# Patient Record
Sex: Male | Born: 1935 | Race: White | Hispanic: No | Marital: Married | Smoking: Former smoker
Health system: Southern US, Community
[De-identification: ages and names within clinical notes are randomized; demographics above are authoritative.]

## PROBLEM LIST (undated history)

## (undated) DIAGNOSIS — E785 Hyperlipidemia, unspecified: Secondary | ICD-10-CM

## (undated) DIAGNOSIS — R011 Cardiac murmur, unspecified: Secondary | ICD-10-CM

## (undated) DIAGNOSIS — I712 Thoracic aortic aneurysm, without rupture, unspecified: Secondary | ICD-10-CM

## (undated) DIAGNOSIS — I2510 Atherosclerotic heart disease of native coronary artery without angina pectoris: Secondary | ICD-10-CM

## (undated) DIAGNOSIS — Z87442 Personal history of urinary calculi: Secondary | ICD-10-CM

## (undated) DIAGNOSIS — D229 Melanocytic nevi, unspecified: Secondary | ICD-10-CM

## (undated) DIAGNOSIS — M1990 Unspecified osteoarthritis, unspecified site: Secondary | ICD-10-CM

## (undated) DIAGNOSIS — I447 Left bundle-branch block, unspecified: Secondary | ICD-10-CM

## (undated) DIAGNOSIS — E7889 Other lipoprotein metabolism disorders: Secondary | ICD-10-CM

## (undated) DIAGNOSIS — I700 Atherosclerosis of aorta: Secondary | ICD-10-CM

## (undated) DIAGNOSIS — I359 Nonrheumatic aortic valve disorder, unspecified: Secondary | ICD-10-CM

## (undated) DIAGNOSIS — I440 Atrioventricular block, first degree: Secondary | ICD-10-CM

## (undated) DIAGNOSIS — I10 Essential (primary) hypertension: Secondary | ICD-10-CM

## (undated) DIAGNOSIS — C189 Malignant neoplasm of colon, unspecified: Secondary | ICD-10-CM

## (undated) DIAGNOSIS — C4492 Squamous cell carcinoma of skin, unspecified: Secondary | ICD-10-CM

## (undated) DIAGNOSIS — I77810 Thoracic aortic ectasia: Secondary | ICD-10-CM

## (undated) HISTORY — DX: Atrioventricular block, first degree: I44.0

## (undated) HISTORY — PX: LUMBAR LAMINECTOMY: SHX95

## (undated) HISTORY — PX: TONSILLECTOMY: SUR1361

## (undated) HISTORY — DX: Malignant neoplasm of colon, unspecified: C18.9

## (undated) HISTORY — DX: Atherosclerotic heart disease of native coronary artery without angina pectoris: I25.10

## (undated) HISTORY — DX: Atherosclerosis of aorta: I70.0

## (undated) HISTORY — PX: JOINT REPLACEMENT: SHX530

## (undated) HISTORY — DX: Essential (primary) hypertension: I10

## (undated) HISTORY — DX: Thoracic aortic ectasia: I77.810

## (undated) HISTORY — PX: COLONOSCOPY W/ POLYPECTOMY: SHX1380

## (undated) HISTORY — PX: COLON SURGERY: SHX602

## (undated) HISTORY — DX: Nonrheumatic aortic valve disorder, unspecified: I35.9

## (undated) HISTORY — DX: Thoracic aortic aneurysm, without rupture, unspecified: I71.20

## (undated) HISTORY — DX: Left bundle-branch block, unspecified: I44.7

## (undated) HISTORY — PX: SPINE SURGERY: SHX786

## (undated) HISTORY — DX: Other lipoprotein metabolism disorders: E78.89

## (undated) HISTORY — DX: Thoracic aortic aneurysm, without rupture: I71.2

## (undated) HISTORY — DX: Hyperlipidemia, unspecified: E78.5

## (undated) HISTORY — DX: Squamous cell carcinoma of skin, unspecified: C44.92

## (undated) HISTORY — PX: CARDIAC VALVE REPLACEMENT: SHX585

## (undated) HISTORY — PX: BACK SURGERY: SHX140

---
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
## 1995-06-20 HISTORY — PX: PARTIAL COLECTOMY: SHX5273

## 1996-06-19 HISTORY — PX: TYMPANOPLASTY: SHX33

## 2005-08-03 ENCOUNTER — Encounter: Admission: RE | Admit: 2005-08-03 | Discharge: 2005-08-03 | Payer: Self-pay | Admitting: Family Medicine

## 2005-10-11 ENCOUNTER — Encounter: Admission: RE | Admit: 2005-10-11 | Discharge: 2005-10-11 | Payer: Self-pay | Admitting: Family Medicine

## 2006-06-22 ENCOUNTER — Ambulatory Visit: Payer: Self-pay | Admitting: Internal Medicine

## 2006-06-22 LAB — CONVERTED CEMR LAB
ALT: 24 units/L (ref 0–40)
AST: 23 units/L (ref 0–37)
Albumin: 3.6 g/dL (ref 3.5–5.2)
Alkaline Phosphatase: 62 units/L (ref 39–117)
BUN: 13 mg/dL (ref 6–23)
Basophils Absolute: 0 10*3/uL (ref 0.0–0.1)
Basophils Relative: 0.9 % (ref 0.0–1.0)
CO2: 29 meq/L (ref 19–32)
Calcium: 9 mg/dL (ref 8.4–10.5)
Chloride: 108 meq/L (ref 96–112)
Chol/HDL Ratio, serum: 4
Cholesterol: 228 mg/dL (ref 0–200)
Creatinine, Ser: 0.9 mg/dL (ref 0.4–1.5)
Eosinophil percent: 3.4 % (ref 0.0–5.0)
GFR calc non Af Amer: 88 mL/min
Glomerular Filtration Rate, Af Am: 107 mL/min/{1.73_m2}
Glucose, Bld: 94 mg/dL (ref 70–99)
HCT: 40.9 % (ref 39.0–52.0)
HDL: 57.2 mg/dL (ref >39.0)
Hemoglobin: 13.5 g/dL (ref 13.0–17.0)
LDL DIRECT: 135.2 mg/dL
Lymphocytes Relative: 25.3 % (ref 12.0–46.0)
MCHC: 33.1 g/dL (ref 30.0–36.0)
MCV: 96.3 fL (ref 78.0–100.0)
Monocytes Absolute: 0.7 10*3/uL (ref 0.2–0.7)
Monocytes Relative: 12.4 % — ABNORMAL HIGH (ref 3.0–11.0)
Neutro Abs: 3.2 10*3/uL (ref 1.4–7.7)
Neutrophils Relative %: 58 % (ref 43.0–77.0)
PSA: 0.8 ng/mL (ref 0.10–4.00)
Platelets: 293 10*3/uL (ref 150–400)
Potassium: 4.2 meq/L (ref 3.5–5.1)
RBC: 4.24 M/uL (ref 4.22–5.81)
RDW: 15.1 % — ABNORMAL HIGH (ref 11.5–14.6)
Sodium: 141 meq/L (ref 135–145)
TSH: 1 microintl units/mL (ref 0.35–5.50)
Total Bilirubin: 0.9 mg/dL (ref 0.3–1.2)
Total Protein: 6.8 g/dL (ref 6.0–8.3)
Triglyceride fasting, serum: 87 mg/dL (ref 0–149)
VLDL: 17 mg/dL (ref 0–40)
WBC: 5.5 10*3/uL (ref 4.5–10.5)

## 2006-07-13 ENCOUNTER — Ambulatory Visit: Payer: Self-pay | Admitting: Internal Medicine

## 2006-11-01 ENCOUNTER — Ambulatory Visit: Payer: Self-pay | Admitting: Internal Medicine

## 2006-11-13 ENCOUNTER — Ambulatory Visit: Payer: Self-pay | Admitting: Internal Medicine

## 2006-11-15 ENCOUNTER — Encounter: Admission: RE | Admit: 2006-11-15 | Discharge: 2006-11-15 | Payer: Self-pay | Admitting: Internal Medicine

## 2007-01-31 DIAGNOSIS — I350 Nonrheumatic aortic (valve) stenosis: Secondary | ICD-10-CM | POA: Insufficient documentation

## 2007-01-31 DIAGNOSIS — E7800 Pure hypercholesterolemia, unspecified: Secondary | ICD-10-CM | POA: Insufficient documentation

## 2007-01-31 DIAGNOSIS — I10 Essential (primary) hypertension: Secondary | ICD-10-CM | POA: Insufficient documentation

## 2007-01-31 DIAGNOSIS — Z85038 Personal history of other malignant neoplasm of large intestine: Secondary | ICD-10-CM | POA: Insufficient documentation

## 2007-01-31 DIAGNOSIS — M109 Gout, unspecified: Secondary | ICD-10-CM | POA: Insufficient documentation

## 2007-06-20 HISTORY — PX: FACET JOINT INJECTION: SHX5016

## 2007-06-21 ENCOUNTER — Telehealth: Payer: Self-pay | Admitting: Internal Medicine

## 2007-06-24 ENCOUNTER — Encounter: Payer: Self-pay | Admitting: Internal Medicine

## 2007-12-27 ENCOUNTER — Ambulatory Visit: Payer: Self-pay | Admitting: Internal Medicine

## 2007-12-27 LAB — CONVERTED CEMR LAB
ALT: 25 units/L (ref 0–53)
AST: 22 units/L (ref 0–37)
Albumin: 3.6 g/dL (ref 3.5–5.2)
Alkaline Phosphatase: 63 units/L (ref 39–117)
BUN: 15 mg/dL (ref 6–23)
Basophils Absolute: 0 10*3/uL (ref 0.0–0.1)
Basophils Relative: 0.5 % (ref 0.0–1.0)
Bilirubin, Direct: 0.1 mg/dL (ref 0.0–0.3)
CO2: 28 meq/L (ref 19–32)
Calcium: 9.2 mg/dL (ref 8.4–10.5)
Chloride: 106 meq/L (ref 96–112)
Cholesterol: 216 mg/dL (ref 0–200)
Creatinine, Ser: 0.8 mg/dL (ref 0.4–1.5)
Direct LDL: 117.7 mg/dL
Eosinophils Absolute: 0.2 10*3/uL (ref 0.0–0.7)
Eosinophils Relative: 3.1 % (ref 0.0–5.0)
GFR calc Af Amer: 122 mL/min
GFR calc non Af Amer: 101 mL/min
Glucose, Bld: 95 mg/dL (ref 70–99)
HCT: 39.4 % (ref 39.0–52.0)
HDL: 55.4 mg/dL (ref >39.0)
Hemoglobin: 13.5 g/dL (ref 13.0–17.0)
Lymphocytes Relative: 20.5 % (ref 12.0–46.0)
MCHC: 34.2 g/dL (ref 30.0–36.0)
MCV: 97.4 fL (ref 78.0–100.0)
Monocytes Absolute: 0.7 10*3/uL (ref 0.1–1.0)
Monocytes Relative: 10.6 % (ref 3.0–12.0)
Neutro Abs: 4.1 10*3/uL (ref 1.4–7.7)
Neutrophils Relative %: 65.3 % (ref 43.0–77.0)
PSA: 1.19 ng/mL (ref 0.10–4.00)
Platelets: 311 10*3/uL (ref 150–400)
Potassium: 4.5 meq/L (ref 3.5–5.1)
RBC: 4.05 M/uL — ABNORMAL LOW (ref 4.22–5.81)
RDW: 14.2 % (ref 11.5–14.6)
Sodium: 141 meq/L (ref 135–145)
TSH: 1.07 microintl units/mL (ref 0.35–5.50)
Total Bilirubin: 0.9 mg/dL (ref 0.3–1.2)
Total CHOL/HDL Ratio: 3.9
Total Protein: 7.1 g/dL (ref 6.0–8.3)
Triglycerides: 134 mg/dL (ref 0–149)
VLDL: 27 mg/dL (ref 0–40)
WBC: 6.3 10*3/uL (ref 4.5–10.5)

## 2008-01-03 ENCOUNTER — Ambulatory Visit: Payer: Self-pay | Admitting: Cardiology

## 2008-01-07 DIAGNOSIS — Z87442 Personal history of urinary calculi: Secondary | ICD-10-CM | POA: Insufficient documentation

## 2008-01-09 ENCOUNTER — Encounter: Payer: Self-pay | Admitting: Internal Medicine

## 2008-01-14 ENCOUNTER — Ambulatory Visit: Payer: Self-pay | Admitting: Internal Medicine

## 2008-01-20 ENCOUNTER — Encounter: Payer: Self-pay | Admitting: Internal Medicine

## 2008-06-19 HISTORY — PX: LITHOTRIPSY: SUR834

## 2008-07-02 ENCOUNTER — Ambulatory Visit: Payer: Self-pay | Admitting: Internal Medicine

## 2008-07-03 ENCOUNTER — Encounter: Payer: Self-pay | Admitting: Internal Medicine

## 2008-07-03 LAB — CONVERTED CEMR LAB: Crystals, Fluid: NONE SEEN

## 2008-07-08 ENCOUNTER — Telehealth (INDEPENDENT_AMBULATORY_CARE_PROVIDER_SITE_OTHER): Payer: Self-pay | Admitting: *Deleted

## 2008-08-07 ENCOUNTER — Encounter: Payer: Self-pay | Admitting: Internal Medicine

## 2008-08-20 ENCOUNTER — Telehealth: Payer: Self-pay | Admitting: Internal Medicine

## 2008-08-31 ENCOUNTER — Telehealth: Payer: Self-pay | Admitting: Internal Medicine

## 2008-12-18 ENCOUNTER — Encounter: Payer: Self-pay | Admitting: Internal Medicine

## 2009-01-29 ENCOUNTER — Encounter: Payer: Self-pay | Admitting: Internal Medicine

## 2009-05-24 ENCOUNTER — Encounter (INDEPENDENT_AMBULATORY_CARE_PROVIDER_SITE_OTHER): Payer: Self-pay | Admitting: *Deleted

## 2009-05-27 ENCOUNTER — Ambulatory Visit (HOSPITAL_COMMUNITY): Admission: RE | Admit: 2009-05-27 | Discharge: 2009-05-27 | Payer: Self-pay | Admitting: Urology

## 2009-06-03 ENCOUNTER — Encounter: Payer: Self-pay | Admitting: Internal Medicine

## 2009-08-13 ENCOUNTER — Telehealth: Payer: Self-pay | Admitting: Internal Medicine

## 2009-10-04 ENCOUNTER — Ambulatory Visit: Payer: Self-pay | Admitting: Internal Medicine

## 2009-10-13 LAB — CONVERTED CEMR LAB
ALT: 20 units/L (ref 0–53)
AST: 22 units/L (ref 0–37)
Albumin: 3.7 g/dL (ref 3.5–5.2)
Alkaline Phosphatase: 63 units/L (ref 39–117)
BUN: 13 mg/dL (ref 6–23)
Basophils Absolute: 0 10*3/uL (ref 0.0–0.1)
Basophils Relative: 0.9 % (ref 0.0–3.0)
Bilirubin, Direct: 0 mg/dL (ref 0.0–0.3)
CO2: 29 meq/L (ref 19–32)
Calcium: 9.2 mg/dL (ref 8.4–10.5)
Chloride: 108 meq/L (ref 96–112)
Cholesterol: 217 mg/dL — ABNORMAL HIGH (ref 0–200)
Creatinine, Ser: 0.8 mg/dL (ref 0.4–1.5)
Direct LDL: 131.2 mg/dL
Eosinophils Absolute: 0.1 10*3/uL (ref 0.0–0.7)
Eosinophils Relative: 3 % (ref 0.0–5.0)
GFR calc non Af Amer: 100.24 mL/min (ref >60)
Glucose, Bld: 107 mg/dL — ABNORMAL HIGH (ref 70–99)
HCT: 35.6 % — ABNORMAL LOW (ref 39.0–52.0)
HDL: 65.7 mg/dL (ref >39.00)
Hemoglobin: 12.1 g/dL — ABNORMAL LOW (ref 13.0–17.0)
Lymphocytes Relative: 27 % (ref 12.0–46.0)
Lymphs Abs: 1.3 10*3/uL (ref 0.7–4.0)
MCHC: 34 g/dL (ref 30.0–36.0)
MCV: 94 fL (ref 78.0–100.0)
Monocytes Absolute: 0.5 10*3/uL (ref 0.1–1.0)
Monocytes Relative: 11.1 % (ref 3.0–12.0)
Neutro Abs: 2.7 10*3/uL (ref 1.4–7.7)
Neutrophils Relative %: 58 % (ref 43.0–77.0)
PSA: 1.36 ng/mL (ref 0.10–4.00)
Platelets: 287 10*3/uL (ref 150.0–400.0)
Potassium: 4.9 meq/L (ref 3.5–5.1)
RBC: 3.78 M/uL — ABNORMAL LOW (ref 4.22–5.81)
RDW: 16.1 % — ABNORMAL HIGH (ref 11.5–14.6)
Sodium: 143 meq/L (ref 135–145)
TSH: 0.69 microintl units/mL (ref 0.35–5.50)
Total Bilirubin: 0.5 mg/dL (ref 0.3–1.2)
Total CHOL/HDL Ratio: 3
Total Protein: 7.1 g/dL (ref 6.0–8.3)
Triglycerides: 89 mg/dL (ref 0.0–149.0)
VLDL: 17.8 mg/dL (ref 0.0–40.0)
WBC: 4.7 10*3/uL (ref 4.5–10.5)

## 2009-10-14 ENCOUNTER — Ambulatory Visit: Payer: Self-pay | Admitting: Internal Medicine

## 2009-10-18 LAB — CONVERTED CEMR LAB
Basophils Absolute: 0 10*3/uL (ref 0.0–0.1)
Basophils Relative: 0.9 % (ref 0.0–3.0)
Eosinophils Absolute: 0.2 10*3/uL (ref 0.0–0.7)
Eosinophils Relative: 4.6 % (ref 0.0–5.0)
Ferritin: 11.1 ng/mL — ABNORMAL LOW (ref 22.0–322.0)
HCT: 36.7 % — ABNORMAL LOW (ref 39.0–52.0)
Hemoglobin: 12.4 g/dL — ABNORMAL LOW (ref 13.0–17.0)
Iron: 21 ug/dL — ABNORMAL LOW (ref 42–165)
Lymphocytes Relative: 34.4 % (ref 12.0–46.0)
Lymphs Abs: 1.7 10*3/uL (ref 0.7–4.0)
MCHC: 33.7 g/dL (ref 30.0–36.0)
MCV: 94.3 fL (ref 78.0–100.0)
Monocytes Absolute: 0.6 10*3/uL (ref 0.1–1.0)
Monocytes Relative: 11.6 % (ref 3.0–12.0)
Neutro Abs: 2.4 10*3/uL (ref 1.4–7.7)
Neutrophils Relative %: 48.5 % (ref 43.0–77.0)
Platelets: 316 10*3/uL (ref 150.0–400.0)
RBC: 3.89 M/uL — ABNORMAL LOW (ref 4.22–5.81)
RDW: 16.1 % — ABNORMAL HIGH (ref 11.5–14.6)
Saturation Ratios: 5 % — ABNORMAL LOW (ref 20.0–50.0)
Transferrin: 298.6 mg/dL (ref 212.0–360.0)
Vitamin B-12: 389 pg/mL (ref 211–911)
WBC: 4.9 10*3/uL (ref 4.5–10.5)

## 2009-10-19 ENCOUNTER — Encounter (INDEPENDENT_AMBULATORY_CARE_PROVIDER_SITE_OTHER): Payer: Self-pay | Admitting: *Deleted

## 2009-10-19 ENCOUNTER — Ambulatory Visit: Payer: Self-pay | Admitting: Internal Medicine

## 2009-10-22 ENCOUNTER — Ambulatory Visit: Payer: Self-pay | Admitting: Internal Medicine

## 2009-10-22 LAB — HM COLONOSCOPY

## 2009-10-25 ENCOUNTER — Encounter: Payer: Self-pay | Admitting: Internal Medicine

## 2009-12-10 ENCOUNTER — Encounter: Payer: Self-pay | Admitting: Internal Medicine

## 2010-07-19 NOTE — Procedures (Signed)
Summary: Colonoscopy  Patient: Barry Decker Note: All result statuses are Final unless otherwise noted.  Tests: (1) Colonoscopy (COL)   COL Colonoscopy           DONE     [facility]     [address]     [place], [place]  [address]           COLONOSCOPY PROCEDURE REPORT           PATIENT:  [name], [name]  MR#:  [id number]     BIRTHDATE:  08/31/1932, 76 yrs. old  GENDER:  male     ENDOSCOPIST:  Hedwig Morton. Juanda Chance, MD     REF. BY:  Birdie Sons, M.D.     PROCEDURE DATE:  10/22/2009     PROCEDURE:  Colonoscopy 62703     ASA CLASS:  Class II     INDICATIONS:  Routine Risk Screening prior right hemicolectomy for     a benign polyp     MEDICATIONS:   Versed 5 mg, Fentanyl 62.5 mcg           DESCRIPTION OF PROCEDURE:   After the risks benefits and     alternatives of the procedure were thoroughly explained, informed     consent was obtained.  Digital rectal exam was performed and     revealed no rectal masses.   The LB CF-[id number] endoscope     was introduced through the anus and advanced to the cecum, which     was identified by both the appendix and ileocecal valve, without     limitations.  The quality of the prep was good, using MiraLax.     The instrument was then slowly withdrawn as the colon was fully     examined.     <<PROCEDUREIMAGES>>           FINDINGS:  A diminutive polyp was found in the rectum. The polyp     was removed using cold biopsy forceps (see image4).  The right     colon was surgically resected and an ileo-colonic anastamosis was     seen (see image2 and image3).  Internal hemorrhoids were found     (see image5).   Retroflexed views in the rectum revealed no     abnormalities.    The scope was then withdrawn from the patient     and the procedure completed.           COMPLICATIONS:  None     ENDOSCOPIC IMPRESSION:     1) Diminutive polyp in the rectum     2) Prior right hemi-colectomy     3) Internal hemorrhoids     RECOMMENDATIONS:     1)  Await pathology results     2) high fiber diet     REPEAT EXAM:  In 5 - 7 year(s) for.           ______________________________     Hedwig Morton. Juanda Chance, MD           CC:           n.     eSIGNED:   Hedwig Morton. Winry Egnew at 10/22/2009 08:28 AM           Judithann Graves, [id number]  Note: An exclamation mark (!) indicates a result that was not dispersed into the flowsheet. Document Creation Date: 10/22/2009 8:29 AM _______________________________________________________________________  (1) Order result status: Final Collection or observation date-time: 10/22/2009 08:21 Requested date-time:  Receipt  date-time:  Reported date-time:  Referring Physician:   Ordering Physician: Lina Sar [id number]) Specimen Source:  Source: Launa Grill Order Number: [id number] Lab site:   Appended Document: Colonoscopy     Procedures Next Due Date:    Colonoscopy: 10/2016

## 2010-07-19 NOTE — Letter (Signed)
Summary: Patient Notice- Polyp Results  [facility]  [address] [place], [place] [address]   Phone: [phone]  Fax: [phone]        Oct 25, 2009 MRN: [id number]    [facility] [address] [place], [place]  [address]    Dear Barry Decker,  I am pleased to inform you that the colon polyp(s) removed during your recent colonoscopy was (were) found to be benign (no cancer detected) upon pathologic examination.The polyp was hyperplastic ( not precancerous)  I recommend you have a repeat colonoscopy examination in 7_ years to look for recurrent polyps, as having colon polyps increases your risk for having recurrent polyps or even colon cancer in the future.  Should you develop new or worsening symptoms of abdominal pain, bowel habit changes or bleeding from the rectum or bowels, please schedule an evaluation with either your primary care physician or with me.  Additional information/recommendations:  _x_ No further action with gastroenterology is needed at this time. Please      follow-up with your primary care physician for your other healthcare      needs.  __ Please call [phone] to schedule a return visit to review your      situation.  __ Please keep your follow-up visit as already scheduled.  __ Continue treatment plan as outlined the day of your exam.  Please call us if you are having persistent problems or have questions about your condition that have not been fully answered at this time.  Sincerely,  Hart Carwin MD  This letter has been electronically signed by your physician.  Appended Document: Patient Notice- Polyp Results letter mailed 5.11.11

## 2010-07-19 NOTE — Progress Notes (Signed)
Summary: refill  Phone Note Refill Request Message from:  Fax from Pharmacy on August 13, 2009 2:25 PM  Refills Requested: Medication #1:  NIFEDICAL XL 60 MG  TB24 Take 1 tablet by mouth once a day  Method Requested: Electronic Initial call taken by: Kern Reap CMA Duncan Dull),  August 13, 2009 2:25 PM    Prescriptions: NIFEDICAL XL 60 MG  TB24 (NIFEDIPINE) Take 1 tablet by mouth once a day  #90 x 1   Entered by:   Kern Reap CMA (AAMA)   Authorized by:   Birdie Sons MD   Signed by:   Kern Reap CMA (AAMA) on 08/13/2009   Method used:   Electronically to        [facility]* (mail-order)             ,          Ph: [phone]       Fax: [phone]   RxID:   [id number]

## 2010-07-19 NOTE — Assessment & Plan Note (Signed)
Summary: emp---will fast//ccm   Vital Signs:  Patient profile:   77 year old male Height:      72.5 inches Weight:      235 pounds BMI:     31.55 Pulse rate:   68 / minute Pulse rhythm:   regular Resp:     12 per minute BP sitting:   154 / 68  (left arm) Cuff size:   regular  Vitals Entered By: Gladis Riffle, RN (October 04, 2009 8:51 AM)  Nutrition Counseling: Patient's BMI is greater than 25 and therefore counseled on weight management options. CC: cpx, fasting Is Patient Diabetic? No   CC:  cpx and fasting.  History of Present Illness: CPX  Preventive Screening-Counseling & Management  Alcohol-Tobacco     Alcohol drinks/day: <1     Smoking Status: quit     Year Quit: 1985  Current Problems (verified): 1)  Nephrolithiasis, Hx of  (ICD-V13.01) 2)  Preventive Health Care  (ICD-V70.0) 3)  Aortic Regurgitation  (ICD-424.1) 4)  Hypercholesterolemia With High Hdl  (ICD-272.0) 5)  Hypertension  (ICD-401.9) 6)  Hyperlipidemia  (ICD-272.4) 7)  Gout  (ICD-274.9) 8)  Colon Cancer, Hx of  (ICD-V10.05)  Current Medications (verified): 1)  Allopurinol 300 Mg  Tabs (Allopurinol) .... Take 1 Tablet By Mouth Once A Day 2)  Nifedical Xl 60 Mg  Tb24 (Nifedipine) .... Take 1 Tablet By Mouth Once A Day 3)  Quinapril Hcl 20 Mg  Tabs (Quinapril Hcl) .... Take 1 Tablet By Mouth Once A Day 4)  Aspirin 81 Mg  Tbec (Aspirin) .... Once Daily  Allergies (verified): No Known Drug Allergies  Past History:  Past Medical History: Last updated: 2008/01/06 Colon cancer, hx of (precancer) Gout elevated HDL Hypertension aortic regurgitation squamous cell skin cancer  Family History: Last updated: 01/06/2008 father deceased 40yo mother deceased MI age 78 yo  Social History: Last updated: 01-06-2008 Retired Former Smoker Alcohol use-yes Regular exercise-no  Risk Factors: Alcohol Use: <1 (10/04/2009) Exercise: no (January 06, 2008)  Risk Factors: Smoking Status: quit  (10/04/2009)  Past Surgical History: lumbar laminectomy   ~1990 facet joint injection 2009 partial colectomy --precancerours 2005  Review of Systems       All other systems reviewed and were negative   Physical Exam  General:  alert and well-developed.   Head:  normocephalic and atraumatic.   Eyes:  pupils equal and pupils round.   Ears:  R ear normal and L ear normal.   Nose:  no external deformity and no external erythema.   Neck:  No deformities, masses, or tenderness noted. Chest Wall:  No deformities, masses, tenderness or gynecomastia noted. Lungs:  normal respiratory effort and no intercostal retractions.   Heart:  normal rate and regular rhythm.   Abdomen:  soft and non-tender.   Rectal:  no external abnormalities and normal sphincter tone.   Prostate:  no nodules, no asymmetry, and 2+ enlarged.   Msk:  normal ROM and no joint tenderness.   Pulses:  R radial normal and L radial normal.   Extremities:  No clubbing, cyanosis, edema, or deformity noted  Neurologic:  cranial nerves II-XII intact and gait normal.     Impression & Recommendations:  Problem # 1:  PREVENTIVE HEALTH CARE (ICD-V70.0)  health maint UTD  Orders: EKG w/ Interpretation (93000) UA Dipstick w/o Micro (automated)  (81003) Venipuncture (02725) TLB-Lipid Panel (80061-LIPID) TLB-BMP (Basic Metabolic Panel-BMET) (80048-METABOL) TLB-CBC Platelet - w/Differential (85025-CBCD) TLB-Hepatic/Liver Function Pnl (80076-HEPATIC) TLB-TSH (Thyroid Stimulating Hormone) (84443-TSH) TLB-PSA (  Prostate Specific Antigen) (84153-PSA)  Problem # 2:  HYPERCHOLESTEROLEMIA WITH HIGH HDL (ICD-272.0) controlled check labs today Labs Reviewed: SGOT: 22 (12/27/2007)   SGPT: 25 (12/27/2007)   HDL:55.4 (12/27/2007), 57.2 (06/22/2006)  LDL:DEL (12/27/2007), DEL (06/22/2006)  Chol:216 (12/27/2007), 228 (06/22/2006)  Trig:134 (12/27/2007), 87 (06/22/2006)  Problem # 3:  HYPERTENSION (ICD-401.9) advised to monitor at  home goal bp <135/85 His updated medication list for this problem includes:    Nifedical Xl 60 Mg Tb24 (Nifedipine) .Marland Kitchen... Take 1 tablet by mouth once a day    Quinapril Hcl 20 Mg Tabs (Quinapril hcl) .Marland Kitchen... Take 1 tablet by mouth once a day  BP today: 154/68---recheck 144/70 Prior BP: 130/66 (07/02/2008)  Labs Reviewed: K+: 4.5 (12/27/2007) Creat: : 0.8 (12/27/2007)   Chol: 216 (12/27/2007)   HDL: 55.4 (12/27/2007)   LDL: DEL (12/27/2007)   TG: 134 (12/27/2007)  Complete Medication List: 1)  Allopurinol 300 Mg Tabs (Allopurinol) .... Take 1 tablet by mouth once a day 2)  Nifedical Xl 60 Mg Tb24 (Nifedipine) .... Take 1 tablet by mouth once a day 3)  Quinapril Hcl 20 Mg Tabs (Quinapril hcl) .... Take 1 tablet by mouth once a day 4)  Aspirin 81 Mg Tbec (Aspirin) .... Once daily Prescriptions: ALLOPURINOL 300 MG  TABS (ALLOPURINOL) Take 1 tablet by mouth once a day  #30 x 0   Entered and Authorized by:   Birdie Sons MD   Signed by:   Birdie Sons MD on 10/04/2009   Method used:   Electronically to        [facility] # [phone]* (retail)       [address]       [place], [place]  [address]       Ph: [phone]       Fax: [phone]   RxID:   [id number] ALLOPURINOL 300 MG  TABS (ALLOPURINOL) Take 1 tablet by mouth once a day  #90 x 3   Entered and Authorized by:   Birdie Sons MD   Signed by:   Birdie Sons MD on 10/04/2009   Method used:   Electronically to        [facility]* (mail-order)             ,          Ph: [phone]       Fax: [phone]   RxID:   [id number]   Prevention & Chronic Care Immunizations   Influenza vaccine: Not documented   Influenza vaccine due: 02/17/2010    Tetanus booster: 12/27/2007: Td   Tetanus booster due: 12/26/2017    Pneumococcal vaccine: historical  (06/19/2004)    H. zoster vaccine: Not documented  Colorectal Screening   Hemoccult: Not documented   Hemoccult action/deferral: Not indicated  (10/04/2009)     Colonoscopy: historical  (06/20/2003)   Colonoscopy due: 06/19/2013  Other Screening   PSA: 1.19  (12/27/2007)   PSA ordered.   Smoking status: quit  (10/04/2009)  Lipids   Total Cholesterol: 216  (12/27/2007)   LDL: DEL  (12/27/2007)   LDL Direct: 117.7  (12/27/2007)   HDL: 55.4  (12/27/2007)   Triglycerides: 134  (12/27/2007)    SGOT (AST): 22  (12/27/2007)   SGPT (ALT): 25  (12/27/2007)   Alkaline phosphatase: 63  (12/27/2007)   Total bilirubin: 0.9  (12/27/2007)  Hypertension   Last Blood Pressure: 154 / 68  (10/04/2009)   Serum creatinine: 0.8  (12/27/2007)   Serum potassium 4.5  (12/27/2007)  Hypertension flowsheet reviewed?: Yes   Progress toward BP goal: Deteriorated  Self-Management Support :    Hypertension self-management support: Not documented    Lipid self-management support: Not documented     Appended Document: emp---will fast//ccm  Laboratory Results   Urine Tests    Routine Urinalysis   Color: yellow Appearance: Clear Glucose: negative   (Normal Range: Negative) Bilirubin: negative   (Normal Range: Negative) Ketone: negative   (Normal Range: Negative) Spec. Gravity: 1.025   (Normal Range: 1.003-1.035) Blood: trace-lysed   (Normal Range: Negative) pH: 5.5   (Normal Range: 5.0-8.0) Protein: trace   (Normal Range: Negative) Urobilinogen: 0.2   (Normal Range: 0-1) Nitrite: negative   (Normal Range: Negative) Leukocyte Esterace: negative   (Normal Range: Negative)    Comments: Rita Ohara  October 04, 2009 10:38 AM      Appended Document: emp---will fast//ccm call patient. unable to locate colonoscopy---refer for screening exam  Appended Document: emp---will fast//ccm Patient wife notified. Will await when and where of appt.  order in process.

## 2010-07-19 NOTE — Letter (Signed)
Summary: Patient Notice- Polyp Results  [facility]  [address] [place], [place] [address]   Phone: [phone]  Fax: [phone]        Oct 25, 2009 MRN: [id number]    [facility] [address] [place], [place]  [address]    Dear Barry Decker,  I am pleased to inform you that the colon polyp(s) removed during your recent colonoscopy was (were) found to be benign (no cancer detected) upon pathologic examination.The polyp was hyperplastic ( not precancerous)  I recommend you have a repeat colonoscopy examination in _7 years to look for recurrent polyps, as having colon polyps increases your risk for having recurrent polyps or even colon cancer in the future.  Should you develop new or worsening symptoms of abdominal pain, bowel habit changes or bleeding from the rectum or bowels, please schedule an evaluation with either your primary care physician or with me.  Additional information/recommendations:  _x_ No further action with gastroenterology is needed at this time. Please      follow-up with your primary care physician for your other healthcare      needs.  __ Please call [phone] to schedule a return visit to review your      situation.  __ Please keep your follow-up visit as already scheduled.  __ Continue treatment plan as outlined the day of your exam.  Please call us if you are having persistent problems or have questions about your condition that have not been fully answered at this time.  Sincerely,  Hart Carwin MD  This letter has been electronically signed by your physician.

## 2010-07-19 NOTE — Letter (Signed)
Summary: [facility]  [facility]   Imported By: Maryln Gottron 12/23/2009 12:20:50  _____________________________________________________________________  External Attachment:    Type:   Image     Comment:   External Document

## 2010-07-19 NOTE — Letter (Signed)
Summary: [facility] Instructions  [facility]  [address] [place], [place] [address]   Phone: [phone]  Fax: [phone]       Barry Decker    Feb 08, 1933    MRN: [id number]       Procedure Day Dorna Bloom:  Farrell Ours  10/22/09     Arrival Time: 7:30AM     Procedure Time:  8:00AM     Location of Procedure:                    Juliann Pares _  [facility] (4th Floor)    PREPARATION FOR COLONOSCOPY WITH MIRALAX  Starting 5 days prior to your procedure 10/17/09 do not eat nuts, seeds, popcorn, corn, beans, peas,  salads, or any raw vegetables.  Do not take any fiber supplements (e.g. Metamucil, Citrucel, and Benefiber). ____________________________________________________________________________________________________   THE DAY BEFORE YOUR PROCEDURE         DATE: 10/21/09  DAY: THURSDAY  1   Drink clear liquids the entire day-NO SOLID FOOD  2   Do not drink anything colored red or purple.  Avoid juices with pulp.  No orange juice.  3   Drink at least 64 oz. (8 glasses) of fluid/clear liquids during the day to prevent dehydration and help the prep work efficiently.  CLEAR LIQUIDS INCLUDE: Water Jello Ice Popsicles Tea (sugar ok, no milk/cream) Powdered fruit flavored drinks Coffee (sugar ok, no milk/cream) Gatorade Juice: apple, white grape, white cranberry  Lemonade Clear bullion, consomm, broth Carbonated beverages (any kind) Strained chicken noodle soup Hard Candy  4   Mix the entire bottle of Miralax with 64 oz. of Gatorade/Powerade in the morning and put in the refrigerator to chill.  5   At 3:00 pm take 2 Dulcolax/Bisacodyl tablets.  6   At 4:30 pm take one Reglan/Metoclopramide tablet.  7  Starting at 5:00 pm drink one 8 oz glass of the Miralax mixture every 15-20 minutes until you have finished drinking the entire 64 oz.  You should finish drinking prep around 7:30 or 8:00 pm.  8   If you are nauseated, you may take the 2nd Reglan/Metoclopramide tablet  at 6:30 pm.        9    At 8:00 pm take 2 more DULCOLAX/Bisacodyl tablets.     THE DAY OF YOUR PROCEDURE      DATE:  10/22/09  DAY: Farrell Ours  You may drink clear liquids until 6:00AM  (2 HOURS BEFORE PROCEDURE).   MEDICATION INSTRUCTIONS  Unless otherwise instructed, you should take regular prescription medications with a small sip of water as early as possible the morning of your procedure.          OTHER INSTRUCTIONS  You will need a responsible adult at least 75 years of age to accompany you and drive you home.   This person must remain in the waiting room during your procedure.  Wear loose fitting clothing that is easily removed.  Leave jewelry and other valuables at home.  However, you may wish to bring a book to read or an iPod/MP3 player to listen to music as you wait for your procedure to start.  Remove all body piercing jewelry and leave at home.  Total time from sign-in until discharge is approximately 2-3 hours.  You should go home directly after your procedure and rest.  You can resume normal activities the day after your procedure.  The day of your procedure you should not:   Drive  Make legal decisions   Operate machinery   Drink alcohol   Return to work  You will receive specific instructions about eating, activities and medications before you leave.   The above instructions have been reviewed and explained to me by   Wyona Almas RN  Oct 19, 2009 2:55 PM     I fully understand and can verbalize these instructions _____________________________ Date _______

## 2010-07-19 NOTE — Miscellaneous (Signed)
Summary: [facility] Previsit/prep  Clinical Lists Changes  Medications: Added new medication of DULCOLAX 5 MG  TBEC (BISACODYL) Day before procedure take 2 at 3pm and 2 at 8pm. - Signed Added new medication of METOCLOPRAMIDE HCL 10 MG  TABS (METOCLOPRAMIDE HCL) As per prep instructions. - Signed Added new medication of MIRALAX   POWD (POLYETHYLENE GLYCOL 3350) As per prep  instructions. - Signed Rx of DULCOLAX 5 MG  TBEC (BISACODYL) Day before procedure take 2 at 3pm and 2 at 8pm.;  #4 x 0;  Signed;  Entered by: Wyona Almas RN;  Authorized by: Hart Carwin MD;  Method used: Electronically to [facility] # [phone]*, [address], [place], [place]  [address], Ph: [phone], Fax: [phone] Rx of METOCLOPRAMIDE HCL 10 MG  TABS (METOCLOPRAMIDE HCL) As per prep instructions.;  #2 x 0;  Signed;  Entered by: Wyona Almas RN;  Authorized by: Hart Carwin MD;  Method used: Electronically to [facility] [address] # [phone]*, [address] [place], [place]  [address], Ph: [phone], Fax: [phone] Rx of MIRALAX   POWD (POLYETHYLENE GLYCOL 3350) As per prep  instructions.;  #255gm x 0;  Signed;  Entered by: Wyona Almas RN;  Authorized by: Hart Carwin MD;  Method used: Electronically to [facility] Dr. # [id number]*, [address] [place], [place]  [address], Ph: [phone], Fax: [phone] Observations: Added new observation of NKA: T (10/19/2009 14:24)    Prescriptions: MIRALAX   POWD (POLYETHYLENE GLYCOL 3350) As per prep  instructions.  #255gm x 0   Entered by:   Wyona Almas RN   Authorized by:   Hart Carwin MD   Signed by:   Wyona Almas RN on 10/19/2009   Method used:   Electronically to        [facility] # [phone]* (retail)       [address]       [place], [place]  [address]       Ph: [phone]       Fax: [phone]   RxID:   [id number] METOCLOPRAMIDE HCL 10 MG  TABS (METOCLOPRAMIDE HCL) As per prep instructions.  #2 x 0   Entered by:   Wyona Almas RN  Authorized by:   Hart Carwin MD   Signed by:   Wyona Almas RN on 10/19/2009   Method used:   Electronically to        [facility] # [phone]* (retail)       [address]       [place], [place]  [address]       Ph: [phone]       Fax: [phone]   RxID:   [id number] DULCOLAX 5 MG  TBEC (BISACODYL) Day before procedure take 2 at 3pm and 2 at 8pm.  #4 x 0   Entered by:   Wyona Almas RN   Authorized by:   Hart Carwin MD   Signed by:   Wyona Almas RN on 10/19/2009   Method used:   Electronically to        [facility] # [phone]* (retail)       [address]       [place], [place]  [address]       Ph: [phone]       Fax: [phone]   RxID:   [id number]

## 2010-09-16 ENCOUNTER — Ambulatory Visit (INDEPENDENT_AMBULATORY_CARE_PROVIDER_SITE_OTHER): Payer: Medicare PPO | Admitting: Internal Medicine

## 2010-09-16 ENCOUNTER — Encounter: Payer: Self-pay | Admitting: Internal Medicine

## 2010-09-16 VITALS — BP 144/70 | HR 72 | Temp 97.8°F | Ht 74.0 in | Wt 234.0 lb

## 2010-09-16 DIAGNOSIS — M130 Polyarthritis, unspecified: Secondary | ICD-10-CM | POA: Insufficient documentation

## 2010-09-16 LAB — CBC
HCT: 38.6 % — ABNORMAL LOW (ref 39.0–52.0)
Hemoglobin: 12.8 g/dL — ABNORMAL LOW (ref 13.0–17.0)
MCH: 32.6 pg (ref 26.0–34.0)
MCHC: 33.2 g/dL (ref 30.0–36.0)
MCV: 98.2 fL (ref 78.0–100.0)
Platelets: 303 10*3/uL (ref 150–400)
RBC: 3.93 MIL/uL — ABNORMAL LOW (ref 4.22–5.81)
RDW: 13.4 % (ref 11.5–15.5)
WBC: 5 10*3/uL (ref 4.0–10.5)

## 2010-09-16 LAB — SEDIMENTATION RATE: Sed Rate: 19 mm/hr (ref 0–22)

## 2010-09-16 NOTE — Assessment & Plan Note (Signed)
sxs ongoing for several months Suspect a local MSK problem Will check labs and bilateral knee xrays We will call him with results

## 2010-09-16 NOTE — Progress Notes (Signed)
  Subjective:    Patient ID: Barry Decker, male    DOB: 14-Jun-1933, 77 y.o.   MRN: [id number]  HPI  3 month hx of bilateral knee>>shoulder pain. Knees are most noticeable when he first gets up. Not much pain with continued walking.  No swelling, no erythema. Has tried aleve---no results. Shoulders are only bothersome when he uses his arms vigorously.   Past Medical History  Diagnosis Date  . Colon cancer   . Gout   . Hypertension   . Elevated HDL   . Aortic regurgitation   . Squamous cell skin cancer    Past Surgical History  Procedure Date  . Lumbar laminectomy   . Facial joint injection 2009  . Partial colectomy 2005    precancerours    reports that he quit smoking about 30 years ago. He does not have any smokeless tobacco history on file. He reports that he drinks alcohol. His drug history not on file. family history includes Heart attack in his brother, father, and mother and Hypertension in his sister. No Known Allergies   Review of Systems  patient denies chest pain, shortness of breath, orthopnea. Denies lower extremity edema, abdominal pain, change in appetite, change in bowel movements. Patient denies rashes, musculoskeletal complaints. No other specific complaints in a complete review of systems.      Objective:   Physical Exam Well-developed male in no acute distress. HEENT exam atraumatic, normocephalic, neck supple. Chest clear to auscultation cardiac exam S1-S2 are regular. Extremities no edema. This has full range of motion of both shoulders. No erythema or effusions. Patient has full range of motion of both knees without erythema or effusion. Knees are stable bilaterally. Gait is normal.       Assessment & Plan:

## 2010-09-17 LAB — RHEUMATOID FACTOR: Rhuematoid fact SerPl-aCnc: <10 IU/mL (ref <=14)

## 2010-09-19 LAB — ANA: Anti Nuclear Antibody(ANA): NEGATIVE

## 2010-09-20 ENCOUNTER — Other Ambulatory Visit: Payer: Self-pay | Admitting: Internal Medicine

## 2010-09-20 ENCOUNTER — Ambulatory Visit (INDEPENDENT_AMBULATORY_CARE_PROVIDER_SITE_OTHER)
Admission: RE | Admit: 2010-09-20 | Discharge: 2010-09-20 | Disposition: A | Discharge status: Home / Self Care | Payer: Medicare PPO | Source: Ambulatory Visit | Attending: Internal Medicine | Admitting: Internal Medicine

## 2010-09-20 DIAGNOSIS — M130 Polyarthritis, unspecified: Secondary | ICD-10-CM

## 2010-09-29 NOTE — Progress Notes (Signed)
Pt aware, will call back if he wants PT

## 2010-11-03 ENCOUNTER — Telehealth: Payer: Self-pay | Admitting: Internal Medicine

## 2010-11-03 NOTE — Telephone Encounter (Signed)
Pt came by office and said that he was with [facility] last year and has now changed to [facility]. Pt is on Nifedical XL 60 mg, but this med is too expensive. [employer] told pt that Amlodipine Besylate, is an alternative med to Nifedical and is much cheaper. Pt req script for Amlodipine Besylate 90 day supply to [facility] fax# [phone].

## 2010-11-07 NOTE — Telephone Encounter (Signed)
Ok to change

## 2010-11-07 NOTE — Telephone Encounter (Signed)
Ok to change to amlodipine 5 mg po qd

## 2010-11-09 MED ORDER — AMLODIPINE BESYLATE 5 MG PO TABS: 5.0000 mg | ORAL_TABLET | Freq: Every day | ORAL | Status: DC

## 2010-11-09 NOTE — Telephone Encounter (Signed)
rx faxed

## 2011-02-13 ENCOUNTER — Other Ambulatory Visit: Payer: Self-pay | Admitting: Internal Medicine

## 2011-08-04 ENCOUNTER — Other Ambulatory Visit: Payer: Self-pay | Admitting: Dermatology

## 2011-08-04 DIAGNOSIS — C4492 Squamous cell carcinoma of skin, unspecified: Secondary | ICD-10-CM

## 2011-08-04 HISTORY — DX: Squamous cell carcinoma of skin, unspecified: C44.92

## 2011-08-18 ENCOUNTER — Telehealth: Payer: Self-pay | Admitting: Internal Medicine

## 2011-08-18 ENCOUNTER — Other Ambulatory Visit: Payer: Self-pay | Admitting: *Deleted

## 2011-08-18 MED ORDER — QUINAPRIL HCL 20 MG PO TABS: 20.0000 mg | ORAL_TABLET | Freq: Every day | ORAL | Status: DC

## 2011-08-18 MED ORDER — ALLOPURINOL 300 MG PO TABS: 300.0000 mg | ORAL_TABLET | Freq: Every day | ORAL | Status: DC

## 2011-08-18 NOTE — Telephone Encounter (Signed)
rx sent in electronically 

## 2011-08-18 NOTE — Telephone Encounter (Signed)
quinapril (ACCUPRIL) 20 MG tablet - pt was suppose to have a refill sent to [facility] for this med. As well

## 2011-08-22 ENCOUNTER — Telehealth: Payer: Self-pay | Admitting: Internal Medicine

## 2011-08-22 ENCOUNTER — Other Ambulatory Visit: Payer: Self-pay | Admitting: *Deleted

## 2011-08-22 MED ORDER — QUINAPRIL HCL 20 MG PO TABS: 20.0000 mg | ORAL_TABLET | Freq: Every day | ORAL | Status: DC

## 2011-08-22 NOTE — Telephone Encounter (Signed)
rx resent to [facility]

## 2011-08-22 NOTE — Telephone Encounter (Signed)
Patient called stating that [facility] is still saying they are not receiving the refill requests for accupril. Please assist

## 2011-09-19 ENCOUNTER — Ambulatory Visit (INDEPENDENT_AMBULATORY_CARE_PROVIDER_SITE_OTHER): Payer: Medicare PPO | Admitting: Internal Medicine

## 2011-09-19 ENCOUNTER — Encounter: Payer: Self-pay | Admitting: Internal Medicine

## 2011-09-19 VITALS — BP 162/70 | HR 72 | Temp 98.5°F | Ht 72.0 in | Wt 218.0 lb

## 2011-09-19 DIAGNOSIS — Z85038 Personal history of other malignant neoplasm of large intestine: Secondary | ICD-10-CM

## 2011-09-19 DIAGNOSIS — I10 Essential (primary) hypertension: Secondary | ICD-10-CM

## 2011-09-19 DIAGNOSIS — M109 Gout, unspecified: Secondary | ICD-10-CM

## 2011-09-19 DIAGNOSIS — I359 Nonrheumatic aortic valve disorder, unspecified: Secondary | ICD-10-CM

## 2011-09-19 DIAGNOSIS — Z0000 Encounter for general adult medical examination without abnormal findings: Secondary | ICD-10-CM

## 2011-09-19 DIAGNOSIS — Z87442 Personal history of urinary calculi: Secondary | ICD-10-CM

## 2011-09-19 LAB — CBC WITH DIFFERENTIAL/PLATELET
Basophils Absolute: 0 10*3/uL (ref 0.0–0.1)
Basophils Relative: 0.8 % (ref 0.0–3.0)
Eosinophils Absolute: 0.2 10*3/uL (ref 0.0–0.7)
Eosinophils Relative: 4 % (ref 0.0–5.0)
HCT: 36.8 % — ABNORMAL LOW (ref 39.0–52.0)
Hemoglobin: 12.2 g/dL — ABNORMAL LOW (ref 13.0–17.0)
Lymphocytes Relative: 27.4 % (ref 12.0–46.0)
Lymphs Abs: 1.2 10*3/uL (ref 0.7–4.0)
MCHC: 33.1 g/dL (ref 30.0–36.0)
MCV: 100.4 fl — ABNORMAL HIGH (ref 78.0–100.0)
Monocytes Absolute: 0.4 10*3/uL (ref 0.1–1.0)
Monocytes Relative: 9.8 % (ref 3.0–12.0)
Neutro Abs: 2.5 10*3/uL (ref 1.4–7.7)
Neutrophils Relative %: 58 % (ref 43.0–77.0)
Platelets: 258 10*3/uL (ref 150.0–400.0)
RBC: 3.66 Mil/uL — ABNORMAL LOW (ref 4.22–5.81)
RDW: 14.4 % (ref 11.5–14.6)
WBC: 4.3 10*3/uL — ABNORMAL LOW (ref 4.5–10.5)

## 2011-09-19 LAB — HEPATIC FUNCTION PANEL
ALT: 21 U/L (ref 0–53)
AST: 22 U/L (ref 0–37)
Albumin: 3.9 g/dL (ref 3.5–5.2)
Alkaline Phosphatase: 49 U/L (ref 39–117)
Bilirubin, Direct: 0 mg/dL (ref 0.0–0.3)
Total Bilirubin: 0.3 mg/dL (ref 0.3–1.2)
Total Protein: 6.7 g/dL (ref 6.0–8.3)

## 2011-09-19 LAB — BASIC METABOLIC PANEL
BUN: 21 mg/dL (ref 6–23)
CO2: 27 mEq/L (ref 19–32)
Calcium: 9 mg/dL (ref 8.4–10.5)
Chloride: 103 mEq/L (ref 96–112)
Creatinine, Ser: 0.8 mg/dL (ref 0.4–1.5)
GFR: 96.92 mL/min (ref >60.00)
Glucose, Bld: 87 mg/dL (ref 70–99)
Potassium: 4.3 mEq/L (ref 3.5–5.1)
Sodium: 138 mEq/L (ref 135–145)

## 2011-09-19 LAB — TSH: TSH: 0.69 u[IU]/mL (ref 0.35–5.50)

## 2011-09-19 MED ORDER — POTASSIUM CITRATE ER 10 MEQ (1080 MG) PO TBCR: 10.0000 meq | EXTENDED_RELEASE_TABLET | Freq: Three times a day (TID) | ORAL | Status: DC

## 2011-09-19 NOTE — Progress Notes (Signed)
Patient ID: Barry Decker, male   DOB: March 31, 1933, 78 y.o.   MRN: [id number] cpx  Past Medical History  Diagnosis Date  . Colon cancer   . Gout   . Hypertension   . Elevated HDL   . Aortic regurgitation   . Squamous cell skin cancer     History   Social History  . Marital Status: Married    Spouse Name: N/A    Number of Children: N/A  . Years of Education: N/A   Occupational History  . Not on file.   Social History Main Topics  . Smoking status: Former Smoker    Quit date: 09/15/1980  . Smokeless tobacco: Not on file  . Alcohol Use: Yes  . Drug Use: Not on file  . Sexually Active: Not on file   Other Topics Concern  . Not on file   Social History Narrative  . No narrative on file    Past Surgical History  Procedure Date  . Lumbar laminectomy   . Facial joint injection 2009  . Partial colectomy 2005    precancerours    Family History  Problem Relation Age of Onset  . Heart attack Mother   . Heart attack Father   . Hypertension Sister   . Heart attack Brother     No Known Allergies  Current Outpatient Prescriptions on File Prior to Visit  Medication Sig Dispense Refill  . allopurinol (ZYLOPRIM) 300 MG tablet Take 1 tablet (300 mg total) by mouth daily.  90 tablet  1  . amLODipine (NORVASC) 5 MG tablet TAKE 1 TABLET DAILY  90 tablet  1  . aspirin 81 MG tablet Take 81 mg by mouth daily.        . ferrous sulfate 325 (65 FE) MG tablet Take 325 mg by mouth 2 (two) times daily.        . potassium citrate (UROCIT-K) 10 MEQ (1080 MG) SR tablet Take 10 mEq by mouth 3 (three) times daily with meals.        . quinapril (ACCUPRIL) 20 MG tablet Take 1 tablet (20 mg total) by mouth at bedtime.  90 tablet  1     patient denies chest pain, shortness of breath, orthopnea. Denies lower extremity edema, abdominal pain, change in appetite, change in bowel movements. Patient denies rashes, musculoskeletal complaints. No other specific complaints in a complete review of  systems.   BP 162/70  Pulse 72  Temp(Src) 98.5 F (36.9 C) (Oral)  Ht 6' (1.829 m)  Wt 218 lb (98.884 kg)  BMI 29.57 kg/m2 Well-developed male in no acute distress. HEENT exam atraumatic, normocephalic, extraocular muscles are intact. Conjunctivae are pink without exudate. Neck is supple without lymphadenopathy, thyromegaly, jugular venous distention. Chest is clear to auscultation without increased work of breathing. Cardiac exam S1-S2 are regular. The PMI is normal. 3/6 SEM and 1/6 diastolic decrescendo murmur. Abdominal exam active bowel sounds, soft, nontender. No abdominal bruits. Extremities no clubbing cyanosis or edema. Peripheral pulses are normal without bruits. Neurologic exam alert and oriented without any motor or sensory deficits.   A/P Well visit: health maint UTD

## 2011-09-19 NOTE — Assessment & Plan Note (Signed)
He has a systolic and diastolic aortic murmur. I'd like to get echo

## 2011-09-26 ENCOUNTER — Other Ambulatory Visit: Payer: Self-pay

## 2011-09-26 ENCOUNTER — Ambulatory Visit (HOSPITAL_COMMUNITY): Payer: Medicare PPO | Attending: Internal Medicine

## 2011-09-26 DIAGNOSIS — Z87891 Personal history of nicotine dependence: Secondary | ICD-10-CM | POA: Insufficient documentation

## 2011-09-26 DIAGNOSIS — R002 Palpitations: Secondary | ICD-10-CM | POA: Insufficient documentation

## 2011-09-26 DIAGNOSIS — I359 Nonrheumatic aortic valve disorder, unspecified: Secondary | ICD-10-CM

## 2011-12-15 ENCOUNTER — Other Ambulatory Visit: Payer: Self-pay | Admitting: Internal Medicine

## 2011-12-19 ENCOUNTER — Telehealth: Payer: Self-pay | Admitting: Internal Medicine

## 2011-12-19 ENCOUNTER — Other Ambulatory Visit (INDEPENDENT_AMBULATORY_CARE_PROVIDER_SITE_OTHER): Payer: Medicare PPO

## 2011-12-19 DIAGNOSIS — D649 Anemia, unspecified: Secondary | ICD-10-CM

## 2011-12-19 LAB — CBC WITH DIFFERENTIAL/PLATELET
Basophils Absolute: 0 10*3/uL (ref 0.0–0.1)
Basophils Relative: 0.7 % (ref 0.0–3.0)
Eosinophils Absolute: 0.3 10*3/uL (ref 0.0–0.7)
Eosinophils Relative: 5.2 % — ABNORMAL HIGH (ref 0.0–5.0)
HCT: 38 % — ABNORMAL LOW (ref 39.0–52.0)
Hemoglobin: 12.5 g/dL — ABNORMAL LOW (ref 13.0–17.0)
Lymphocytes Relative: 29.9 % (ref 12.0–46.0)
Lymphs Abs: 1.5 10*3/uL (ref 0.7–4.0)
MCHC: 32.9 g/dL (ref 30.0–36.0)
MCV: 101.7 fl — ABNORMAL HIGH (ref 78.0–100.0)
Monocytes Absolute: 0.4 10*3/uL (ref 0.1–1.0)
Monocytes Relative: 8.5 % (ref 3.0–12.0)
Neutro Abs: 2.8 10*3/uL (ref 1.4–7.7)
Neutrophils Relative %: 55.7 % (ref 43.0–77.0)
Platelets: 197 10*3/uL (ref 150.0–400.0)
RBC: 3.74 Mil/uL — ABNORMAL LOW (ref 4.22–5.81)
RDW: 14.7 % — ABNORMAL HIGH (ref 11.5–14.6)
WBC: 5.1 10*3/uL (ref 4.5–10.5)

## 2011-12-19 NOTE — Telephone Encounter (Signed)
Pt needs a CBC, scheduled lab appt

## 2011-12-19 NOTE — Telephone Encounter (Signed)
Patient is looking for results of last bloodwork from 09/19/11.  Please call patient with results.  [phone]

## 2011-12-27 ENCOUNTER — Other Ambulatory Visit (INDEPENDENT_AMBULATORY_CARE_PROVIDER_SITE_OTHER): Payer: Medicare PPO

## 2011-12-27 DIAGNOSIS — Z79899 Other long term (current) drug therapy: Secondary | ICD-10-CM

## 2011-12-27 DIAGNOSIS — D649 Anemia, unspecified: Secondary | ICD-10-CM

## 2011-12-27 LAB — IBC PANEL
Iron: 60 ug/dL (ref 42–165)
Saturation Ratios: 18.2 % — ABNORMAL LOW (ref 20.0–50.0)
Transferrin: 236.1 mg/dL (ref 212.0–360.0)

## 2011-12-27 LAB — VITAMIN B12: Vitamin B-12: 510 pg/mL (ref 211–911)

## 2011-12-27 LAB — IRON: Iron: 60 ug/dL (ref 42–165)

## 2011-12-27 LAB — FERRITIN: Ferritin: 36.2 ng/mL (ref 22.0–322.0)

## 2012-01-03 ENCOUNTER — Other Ambulatory Visit: Payer: Self-pay | Admitting: Internal Medicine

## 2012-07-03 ENCOUNTER — Other Ambulatory Visit: Payer: Self-pay | Admitting: *Deleted

## 2012-07-03 MED ORDER — ALLOPURINOL 300 MG PO TABS: 300.0000 mg | ORAL_TABLET | Freq: Every day | ORAL | Status: DC

## 2012-07-03 MED ORDER — AMLODIPINE BESYLATE 5 MG PO TABS: 5.0000 mg | ORAL_TABLET | Freq: Every day | ORAL | Status: DC

## 2012-07-03 MED ORDER — QUINAPRIL HCL 20 MG PO TABS: 20.0000 mg | ORAL_TABLET | Freq: Every day | ORAL | Status: DC

## 2012-10-25 ENCOUNTER — Encounter: Payer: Self-pay | Admitting: Internal Medicine

## 2012-10-25 ENCOUNTER — Ambulatory Visit (INDEPENDENT_AMBULATORY_CARE_PROVIDER_SITE_OTHER): Payer: Medicare Other | Admitting: Internal Medicine

## 2012-10-25 VITALS — BP 150/80 | HR 72 | Temp 98.2°F | Ht 72.5 in | Wt 228.0 lb

## 2012-10-25 DIAGNOSIS — I359 Nonrheumatic aortic valve disorder, unspecified: Secondary | ICD-10-CM

## 2012-10-25 DIAGNOSIS — Z0000 Encounter for general adult medical examination without abnormal findings: Secondary | ICD-10-CM

## 2012-10-25 LAB — LIPID PANEL
Cholesterol: 228 mg/dL — ABNORMAL HIGH (ref 0–200)
HDL: 67.2 mg/dL (ref >39.00)
Total CHOL/HDL Ratio: 3
Triglycerides: 102 mg/dL (ref 0.0–149.0)
VLDL: 20.4 mg/dL (ref 0.0–40.0)

## 2012-10-25 LAB — CBC WITH DIFFERENTIAL/PLATELET
Basophils Absolute: 0 10*3/uL (ref 0.0–0.1)
Basophils Relative: 0.6 % (ref 0.0–3.0)
Eosinophils Absolute: 0.1 10*3/uL (ref 0.0–0.7)
Eosinophils Relative: 2.4 % (ref 0.0–5.0)
HCT: 41.6 % (ref 39.0–52.0)
Hemoglobin: 14.1 g/dL (ref 13.0–17.0)
Lymphocytes Relative: 28.7 % (ref 12.0–46.0)
Lymphs Abs: 1.4 10*3/uL (ref 0.7–4.0)
MCHC: 34 g/dL (ref 30.0–36.0)
MCV: 100.9 fl — ABNORMAL HIGH (ref 78.0–100.0)
Monocytes Absolute: 0.6 10*3/uL (ref 0.1–1.0)
Monocytes Relative: 11.7 % (ref 3.0–12.0)
Neutro Abs: 2.8 10*3/uL (ref 1.4–7.7)
Neutrophils Relative %: 56.6 % (ref 43.0–77.0)
Platelets: 234 10*3/uL (ref 150.0–400.0)
RBC: 4.12 Mil/uL — ABNORMAL LOW (ref 4.22–5.81)
RDW: 15.1 % — ABNORMAL HIGH (ref 11.5–14.6)
WBC: 5 10*3/uL (ref 4.5–10.5)

## 2012-10-25 LAB — BASIC METABOLIC PANEL
BUN: 13 mg/dL (ref 6–23)
CO2: 26 mEq/L (ref 19–32)
Calcium: 9.1 mg/dL (ref 8.4–10.5)
Chloride: 104 mEq/L (ref 96–112)
Creatinine, Ser: 1 mg/dL (ref 0.4–1.5)
GFR: 75.98 mL/min (ref >60.00)
Glucose, Bld: 97 mg/dL (ref 70–99)
Potassium: 4.2 mEq/L (ref 3.5–5.1)
Sodium: 138 mEq/L (ref 135–145)

## 2012-10-25 LAB — HEPATIC FUNCTION PANEL
ALT: 21 U/L (ref 0–53)
AST: 19 U/L (ref 0–37)
Albumin: 3.8 g/dL (ref 3.5–5.2)
Alkaline Phosphatase: 51 U/L (ref 39–117)
Bilirubin, Direct: 0 mg/dL (ref 0.0–0.3)
Total Bilirubin: 0.8 mg/dL (ref 0.3–1.2)
Total Protein: 6.8 g/dL (ref 6.0–8.3)

## 2012-10-25 LAB — TSH: TSH: 0.76 u[IU]/mL (ref 0.35–5.50)

## 2012-10-25 LAB — LDL CHOLESTEROL, DIRECT: Direct LDL: 136.2 mg/dL

## 2012-10-27 NOTE — Progress Notes (Signed)
Patient ID: Barry Decker, male   DOB: [birth year], 79 y.o.   MRN: [id number] cpx  Past Medical History  Diagnosis Date  . Colon cancer   . Gout   . Hypertension   . Elevated HDL   . Aortic regurgitation   . Squamous cell skin cancer     History   Social History  . Marital Status: Married    Spouse Name: N/A    Number of Children: N/A  . Years of Education: N/A   Occupational History  . Not on file.   Social History Main Topics  . Smoking status: Former Smoker    Quit date: 09/15/1980  . Smokeless tobacco: Not on file  . Alcohol Use: Yes  . Drug Use: Not on file  . Sexually Active: Not on file   Other Topics Concern  . Not on file   Social History Narrative  . No narrative on file    Past Surgical History  Procedure Laterality Date  . Lumbar laminectomy    . Facial joint injection  2009  . Partial colectomy  2005    precancerours    Family History  Problem Relation Age of Onset  . Heart attack Mother   . Heart attack Father   . Hypertension Sister   . Heart attack Brother     No Known Allergies  Current Outpatient Prescriptions on File Prior to Visit  Medication Sig Dispense Refill  . allopurinol (ZYLOPRIM) 300 MG tablet Take 1 tablet (300 mg total) by mouth daily.  90 tablet  0  . amLODipine (NORVASC) 5 MG tablet Take 1 tablet (5 mg total) by mouth daily.  90 tablet  0  . aspirin 81 MG tablet Take 81 mg by mouth daily.        . ferrous sulfate 325 (65 FE) MG tablet Take 325 mg by mouth 2 (two) times daily.        . potassium citrate (UROCIT-K) 10 MEQ (1080 MG) SR tablet Take 1 tablet (10 mEq total) by mouth 3 (three) times daily with meals.  300 tablet  3  . quinapril (ACCUPRIL) 20 MG tablet Take 1 tablet (20 mg total) by mouth daily.  90 tablet  0   No current facility-administered medications on file prior to visit.     patient denies chest pain, shortness of breath, orthopnea. Denies lower extremity edema, abdominal pain, change in appetite,  change in bowel movements. Patient denies rashes, musculoskeletal complaints. No other specific complaints in a complete review of systems.   BP 150/80  Pulse 72  Temp(Src) 98.2 F (36.8 C) (Oral)  Ht 6' 0.5" (1.842 m)  Wt 228 lb (103.42 kg)  BMI 30.48 kg/m2   well-developed well-nourished male in no acute distress. HEENT exam atraumatic, normocephalic, neck supple without jugular venous distention. Chest clear to auscultation cardiac exam S1-S2 are regular. Abdominal exam overweight with bowel sounds, soft and nontender. Extremities no edema. Neurologic exam is alert with a normal gait.  Well visit- health maint utd

## 2012-10-30 ENCOUNTER — Other Ambulatory Visit: Payer: Self-pay | Admitting: *Deleted

## 2012-11-15 ENCOUNTER — Other Ambulatory Visit: Payer: Self-pay | Admitting: *Deleted

## 2012-11-15 MED ORDER — QUINAPRIL HCL 20 MG PO TABS: 20.0000 mg | ORAL_TABLET | Freq: Every day | ORAL | Status: DC

## 2012-11-15 MED ORDER — ALLOPURINOL 300 MG PO TABS: 300.0000 mg | ORAL_TABLET | Freq: Every day | ORAL | Status: DC

## 2012-11-15 MED ORDER — AMLODIPINE BESYLATE 5 MG PO TABS: 5.0000 mg | ORAL_TABLET | Freq: Every day | ORAL | Status: DC

## 2013-03-14 ENCOUNTER — Ambulatory Visit (INDEPENDENT_AMBULATORY_CARE_PROVIDER_SITE_OTHER): Payer: Medicare Other

## 2013-03-14 DIAGNOSIS — Z23 Encounter for immunization: Secondary | ICD-10-CM

## 2013-04-10 ENCOUNTER — Telehealth: Payer: Self-pay | Admitting: *Deleted

## 2013-04-10 NOTE — Telephone Encounter (Signed)
Erroneous encounter

## 2013-05-14 ENCOUNTER — Other Ambulatory Visit: Payer: Self-pay | Admitting: Dermatology

## 2013-05-14 DIAGNOSIS — D229 Melanocytic nevi, unspecified: Secondary | ICD-10-CM

## 2013-05-14 HISTORY — DX: Melanocytic nevi, unspecified: D22.9

## 2013-07-04 ENCOUNTER — Other Ambulatory Visit: Payer: Self-pay | Admitting: *Deleted

## 2013-07-04 MED ORDER — ALLOPURINOL 300 MG PO TABS: 300.0000 mg | ORAL_TABLET | Freq: Every day | ORAL | Status: DC

## 2013-07-04 MED ORDER — POTASSIUM CITRATE ER 10 MEQ (1080 MG) PO TBCR: 10.0000 meq | EXTENDED_RELEASE_TABLET | Freq: Three times a day (TID) | ORAL | Status: AC

## 2013-07-04 MED ORDER — AMLODIPINE BESYLATE 5 MG PO TABS
5.0000 mg | ORAL_TABLET | Freq: Every day | ORAL | Status: DC
Start: 2013-07-04 — End: 2013-11-11

## 2013-07-04 MED ORDER — QUINAPRIL HCL 20 MG PO TABS: 20.0000 mg | ORAL_TABLET | Freq: Every day | ORAL | Status: DC

## 2013-11-11 ENCOUNTER — Encounter: Payer: Self-pay | Admitting: Internal Medicine

## 2013-11-11 ENCOUNTER — Ambulatory Visit (INDEPENDENT_AMBULATORY_CARE_PROVIDER_SITE_OTHER): Payer: Medicare HMO | Admitting: Internal Medicine

## 2013-11-11 VITALS — BP 154/74 | HR 72 | Temp 97.9°F | Ht 72.5 in | Wt 231.0 lb

## 2013-11-11 DIAGNOSIS — E7800 Pure hypercholesterolemia, unspecified: Secondary | ICD-10-CM

## 2013-11-11 DIAGNOSIS — I10 Essential (primary) hypertension: Secondary | ICD-10-CM

## 2013-11-11 DIAGNOSIS — I359 Nonrheumatic aortic valve disorder, unspecified: Secondary | ICD-10-CM

## 2013-11-11 LAB — HEPATIC FUNCTION PANEL
ALT: 17 U/L (ref 0–53)
AST: 19 U/L (ref 0–37)
Albumin: 3.6 g/dL (ref 3.5–5.2)
Alkaline Phosphatase: 50 U/L (ref 39–117)
Bilirubin, Direct: 0.1 mg/dL (ref 0.0–0.3)
Total Bilirubin: 0.7 mg/dL (ref 0.2–1.2)
Total Protein: 6.6 g/dL (ref 6.0–8.3)

## 2013-11-11 LAB — CBC WITH DIFFERENTIAL/PLATELET
Basophils Absolute: 0 10*3/uL (ref 0.0–0.1)
Basophils Relative: 0.7 % (ref 0.0–3.0)
Eosinophils Absolute: 0.2 10*3/uL (ref 0.0–0.7)
Eosinophils Relative: 3.8 % (ref 0.0–5.0)
HCT: 42.9 % (ref 39.0–52.0)
Hemoglobin: 14.3 g/dL (ref 13.0–17.0)
Lymphocytes Relative: 27.9 % (ref 12.0–46.0)
Lymphs Abs: 1.3 10*3/uL (ref 0.7–4.0)
MCHC: 33.3 g/dL (ref 30.0–36.0)
MCV: 102.8 fl — ABNORMAL HIGH (ref 78.0–100.0)
Monocytes Absolute: 0.6 10*3/uL (ref 0.1–1.0)
Monocytes Relative: 11.6 % (ref 3.0–12.0)
Neutro Abs: 2.7 10*3/uL (ref 1.4–7.7)
Neutrophils Relative %: 56 % (ref 43.0–77.0)
Platelets: 239 10*3/uL (ref 150.0–400.0)
RBC: 4.17 Mil/uL — ABNORMAL LOW (ref 4.22–5.81)
RDW: 15.1 % (ref 11.5–15.5)
WBC: 4.8 10*3/uL (ref 4.0–10.5)

## 2013-11-11 LAB — BASIC METABOLIC PANEL
BUN: 17 mg/dL (ref 6–23)
CO2: 28 mEq/L (ref 19–32)
Calcium: 9.3 mg/dL (ref 8.4–10.5)
Chloride: 104 mEq/L (ref 96–112)
Creatinine, Ser: 0.9 mg/dL (ref 0.4–1.5)
GFR: 88.83 mL/min (ref >60.00)
Glucose, Bld: 88 mg/dL (ref 70–99)
Potassium: 4.7 mEq/L (ref 3.5–5.1)
Sodium: 139 mEq/L (ref 135–145)

## 2013-11-11 LAB — LIPID PANEL
Cholesterol: 195 mg/dL (ref 0–200)
HDL: 70.8 mg/dL (ref >39.00)
LDL Cholesterol: 112 mg/dL — ABNORMAL HIGH (ref 0–99)
Total CHOL/HDL Ratio: 3
Triglycerides: 60 mg/dL (ref 0.0–149.0)
VLDL: 12 mg/dL (ref 0.0–40.0)

## 2013-11-11 LAB — TSH: TSH: 0.82 u[IU]/mL (ref 0.35–4.50)

## 2013-11-11 MED ORDER — ALLOPURINOL 300 MG PO TABS: 300.0000 mg | ORAL_TABLET | Freq: Every day | ORAL | Status: DC

## 2013-11-11 MED ORDER — AMLODIPINE BESYLATE 5 MG PO TABS: 5.0000 mg | ORAL_TABLET | Freq: Every day | ORAL | Status: DC

## 2013-11-11 MED ORDER — QUINAPRIL HCL 20 MG PO TABS: 20.0000 mg | ORAL_TABLET | Freq: Every day | ORAL | Status: DC

## 2013-11-11 NOTE — Progress Notes (Signed)
Left knee pain. OA of knee by xray 2012. Had steroid injection at that time- pretty good relief  htn- tolerating meds. No home BPs. BP checked at [facility]- (takes iron) bps can range 120-160/68-80  Aortic stenosis: He denies chest pain, shortness of breath. Last echocardiogram reviewed.   He has no other complaints. Past Medical History  Diagnosis Date  . Colon cancer   . Gout   . Hypertension   . Elevated HDL   . Aortic regurgitation   . Squamous cell skin cancer     History   Social History  . Marital Status: Married    Spouse Name: N/A    Number of Children: N/A  . Years of Education: N/A   Occupational History  . Not on file.   Social History Main Topics  . Smoking status: Former Smoker    Quit date: 09/15/1980  . Smokeless tobacco: Not on file  . Alcohol Use: Yes  . Drug Use: Not on file  . Sexual Activity: Not on file   Other Topics Concern  . Not on file   Social History Narrative  . No narrative on file    Past Surgical History  Procedure Laterality Date  . Lumbar laminectomy    . Facial joint injection  2009  . Partial colectomy  2005    precancerours    Family History  Problem Relation Age of Onset  . Heart attack Mother   . Heart attack Father   . Hypertension Sister   . Heart attack Brother     No Known Allergies  Current Outpatient Prescriptions on File Prior to Visit  Medication Sig Dispense Refill  . aspirin 81 MG tablet Take 81 mg by mouth daily.        . ferrous sulfate 325 (65 FE) MG tablet Take 325 mg by mouth 2 (two) times daily.        . potassium citrate (UROCIT-K) 10 MEQ (1080 MG) SR tablet Take 1 tablet (10 mEq total) by mouth 3 (three) times daily with meals.  300 tablet  1   No current facility-administered medications on file prior to visit.     patient denies chest pain, shortness of breath, orthopnea. Denies lower extremity edema, abdominal pain, change in appetite, change in bowel movements. Patient denies rashes,  musculoskeletal complaints. No other specific complaints in a complete review of systems.   BP 154/74  Pulse 72  Temp(Src) 97.9 F (36.6 C) (Oral)  Ht 6' 0.5" (1.842 m)  Wt 231 lb (104.781 kg)  BMI 30.88 kg/m2  well-developed well-nourished male in no acute distress. HEENT exam atraumatic, normocephalic, neck supple without jugular venous distention. Chest clear to auscultation cardiac exam S1-S2 are regular, 3/6 systolic ejection murmur at the left upper sternal border.. Abdominal exam overweight with bowel sounds, soft and nontender. Extremities no edema. Neurologic exam is alert with a normal gait.   joiont injection left knee

## 2013-11-11 NOTE — Progress Notes (Signed)
Pre visit review using our clinic review tool, if applicable. No additional management support is needed unless otherwise documented below in the visit note. 

## 2013-11-12 ENCOUNTER — Telehealth: Payer: Self-pay | Admitting: Internal Medicine

## 2013-11-12 NOTE — Telephone Encounter (Signed)
Relevant patient education assigned to patient using Emmi. ° °

## 2013-11-14 NOTE — Assessment & Plan Note (Signed)
It has been over 2 years since he has had an echocardiogram. I think it's reasonable to repeat the echocardiogram now.

## 2013-11-14 NOTE — Assessment & Plan Note (Signed)
Repeat blood pressure 130/60. Continue current medications.

## 2013-11-14 NOTE — Assessment & Plan Note (Signed)
Has not required treatment.

## 2013-12-04 ENCOUNTER — Ambulatory Visit (HOSPITAL_COMMUNITY): Payer: Medicare HMO | Attending: Cardiology | Admitting: Radiology

## 2013-12-04 DIAGNOSIS — I10 Essential (primary) hypertension: Secondary | ICD-10-CM | POA: Insufficient documentation

## 2013-12-04 DIAGNOSIS — I359 Nonrheumatic aortic valve disorder, unspecified: Secondary | ICD-10-CM

## 2013-12-04 DIAGNOSIS — I079 Rheumatic tricuspid valve disease, unspecified: Secondary | ICD-10-CM | POA: Insufficient documentation

## 2013-12-04 DIAGNOSIS — E785 Hyperlipidemia, unspecified: Secondary | ICD-10-CM | POA: Insufficient documentation

## 2013-12-04 DIAGNOSIS — I77819 Aortic ectasia, unspecified site: Secondary | ICD-10-CM | POA: Insufficient documentation

## 2013-12-04 DIAGNOSIS — E669 Obesity, unspecified: Secondary | ICD-10-CM | POA: Insufficient documentation

## 2013-12-04 DIAGNOSIS — Z87891 Personal history of nicotine dependence: Secondary | ICD-10-CM | POA: Insufficient documentation

## 2013-12-04 NOTE — Progress Notes (Signed)
Echocardiogram performed.  

## 2013-12-09 ENCOUNTER — Telehealth: Payer: Self-pay | Admitting: Internal Medicine

## 2013-12-09 DIAGNOSIS — R931 Abnormal findings on diagnostic imaging of heart and coronary circulation: Secondary | ICD-10-CM

## 2013-12-09 NOTE — Telephone Encounter (Signed)
Pt called would like a call back about results of his echocardiogram

## 2013-12-15 NOTE — Telephone Encounter (Signed)
Pt is calling back waiting on result of echo

## 2013-12-17 NOTE — Telephone Encounter (Signed)
Pt aware of results, see result note, referral order placed

## 2013-12-26 ENCOUNTER — Telehealth: Payer: Self-pay | Admitting: Internal Medicine

## 2013-12-26 NOTE — Telephone Encounter (Signed)
Pt states [facility] told him they had no referral for his appt with mcalhany and pt would like to know this has been done. Thanks!

## 2013-12-29 NOTE — Telephone Encounter (Signed)
Done , I INFORMED PT OF THIS AUTHORIZATION  Authorization - [id number] SILVERBACK START -01/22/2014 - END 07/25/2014 Pt scheduled for 01-22-2014@11 :30 am  [facility]  Address: [address], [place], [place] [address]  [phone]

## 2014-01-22 ENCOUNTER — Ambulatory Visit (INDEPENDENT_AMBULATORY_CARE_PROVIDER_SITE_OTHER): Payer: Commercial Managed Care - HMO | Admitting: Cardiovascular Disease

## 2014-01-22 ENCOUNTER — Encounter: Payer: Self-pay | Admitting: Cardiovascular Disease

## 2014-01-22 VITALS — BP 160/62 | HR 60 | Ht 73.0 in | Wt 227.0 lb

## 2014-01-22 DIAGNOSIS — I350 Nonrheumatic aortic (valve) stenosis: Secondary | ICD-10-CM

## 2014-01-22 DIAGNOSIS — I359 Nonrheumatic aortic valve disorder, unspecified: Secondary | ICD-10-CM

## 2014-01-22 NOTE — Progress Notes (Signed)
History of Present Illness: 80 yo male with history of HTN, HLD, aortic stenosis, aortic insufficiency here today to establish cardiology care. He has been known to have AS for many years. He has a history of rheumatic fever as a child with murmur noted his entire life. He has been followed in [place] until he moved here several years ago. Last echo 12/04/13 with normal LVEF, mildly dilated LV, moderate AS (mean gradient 29 mm Hg), mild to moderate AI, mildly dilated aortic root. He has no chest pain, SOB, LE edema, syncope. Very active and doing well.   Primary Care Physician: Phoebe Sharps  Last Lipid Profile:Lipid Panel     Component Value Date/Time   CHOL 195 11/11/2013 1014   TRIG 60.0 11/11/2013 1014   HDL 70.80 11/11/2013 1014   CHOLHDL 3 11/11/2013 1014   VLDL 12.0 11/11/2013 1014   LDLCALC 112* 11/11/2013 1014     Past Medical History  Diagnosis Date  . Colon cancer   . Gout   . Hypertension   . Elevated HDL   . Squamous cell skin cancer   . Aortic valve disease   . Nephrolithiasis     Past Surgical History  Procedure Laterality Date  . Lumbar laminectomy    . Facet joint injection  2009  . Partial colectomy  2005    precancerours    Current Outpatient Prescriptions  Medication Sig Dispense Refill  . allopurinol (ZYLOPRIM) 300 MG tablet Take 1 tablet (300 mg total) by mouth daily.  90 tablet  3  . amLODipine (NORVASC) 5 MG tablet Take 1 tablet (5 mg total) by mouth daily.  90 tablet  3  . aspirin 81 MG tablet Take 81 mg by mouth daily.        . diphenhydrAMINE (SLEEP AID, DIPHENHYDRAMINE,) 25 MG tablet Take 25 mg by mouth at bedtime as needed for sleep.      . IRON PO Take 65 mg by mouth.      . potassium citrate (UROCIT-K) 10 MEQ (1080 MG) SR tablet Take 1 tablet (10 mEq total) by mouth 3 (three) times daily with meals.  300 tablet  1  . quinapril (ACCUPRIL) 20 MG tablet Take 1 tablet (20 mg total) by mouth daily.  90 tablet  3   No current  facility-administered medications for this visit.    No Known Allergies  History   Social History  . Marital Status: Married    Spouse Name: N/A    Number of Children: 54  . Years of Education: N/A   Occupational History  . Retired-labor Control and instrumentation engineer    Social History Main Topics  . Smoking status: Former Smoker -- 1.00 packs/day for 20 years    Types: Cigarettes    Quit date: 09/15/1980  . Smokeless tobacco: Not on file  . Alcohol Use: 5.0 oz/week    10 drink(s) per week  . Drug Use: No  . Sexual Activity: Not on file   Other Topics Concern  . Not on file   Social History Narrative  . No narrative on file    Family History  Problem Relation Age of Onset  . Heart attack Mother 66  . Heart attack Father 90  . Hypertension Sister   . Heart attack Brother 26    Rheumatic fever    Review of Systems:  As stated in the HPI and otherwise negative.   BP 160/62  Pulse 60  Ht 6\' 1"  (1.854 m)  Wt  227 lb (102.967 kg)  BMI 29.96 kg/m2  Physical Examination: General: Well developed, well nourished, NAD HEENT: OP clear, mucus membranes moist SKIN: warm, dry. No rashes. Neuro: No focal deficits Musculoskeletal: Muscle strength 5/5 all ext Psychiatric: Mood and affect normal Neck: No JVD, no carotid bruits, no thyromegaly, no lymphadenopathy. Lungs:Clear bilaterally, no wheezes, rhonci, crackles Cardiovascular: Regular rate and rhythm. Systolic murmur. No gallops or rubs. Abdomen:Soft. Bowel sounds present. Non-tender.  Extremities: No lower extremity edema. Pulses are 2 + in the bilateral DP/PT.  Echo 12/04/13: Left ventricle: The cavity size was mildly dilated. Wall thickness was increased in a pattern of moderate LVH. Systolic function was normal. The estimated ejection fraction was in the range of 60% to 65%. Wall motion was normal; there were no regional wall motion abnormalities. Doppler parameters are consistent with abnormal left ventricular relaxation (grade  1 diastolic dysfunction). - Aortic valve: Valve mobility was restricted. There was moderate stenosis. There was mild to moderate regurgitation. Mean gradient (S): 29 mm Hg. Peak gradient (S): 49 mm Hg. - Aortic root: The aortic root was mildly dilated. - Ascending aorta: The ascending aorta was moderately dilated. - Left atrium: The atrium was mildly dilated. Impressions: - Normal LV function; moderately dilated ascending aorta (suggest CTA or MRA to further assess); moderate AS; mild to moderate AI.  WUJ:WJXBJ, rate 60 bpm. 1st degree AV block. LAFB. LVH. Non-specific ST and T wave abnormalities  Assessment and Plan:   1. Aortic stenosis: Moderate stenosis by echo June 2015 with mean gradient 29 mm Hg(unchanged from echo 2013). Mild to moderate AI. He is completely asymptomatic. I have spent a large portion of the visit reviewing the etiology of aortic stenosis. His is most likely related to history of rheumatic fever. He has mild to moderate AI. No big changes in last 2 years on serial echo. No indication for surgery at this time. I have reviewed the potential future need for AVR with TAVR as an option. Repeat echo June 2016.

## 2014-01-22 NOTE — Patient Instructions (Signed)
Your physician wants you to follow-up in:  12 months. You will receive a reminder letter in the mail two months in advance. If you don't receive a letter, please call our office to schedule the follow-up appointment.  Your physician has requested that you have an echocardiogram. Echocardiography is a painless test that uses sound waves to create images of your heart. It provides your doctor with information about the size and shape of your heart and how well your heart's chambers and valves are working. This procedure takes approximately one hour. There are no restrictions for this procedure. To be done in June 2016

## 2014-03-30 ENCOUNTER — Encounter: Payer: Self-pay | Admitting: Internal Medicine

## 2014-04-03 ENCOUNTER — Other Ambulatory Visit: Payer: Self-pay

## 2014-05-25 ENCOUNTER — Ambulatory Visit (INDEPENDENT_AMBULATORY_CARE_PROVIDER_SITE_OTHER): Payer: Commercial Managed Care - HMO | Admitting: Family Medicine

## 2014-05-25 ENCOUNTER — Encounter: Payer: Self-pay | Admitting: Family Medicine

## 2014-05-25 VITALS — BP 140/64 | HR 72 | Temp 98.4°F | Wt 229.0 lb

## 2014-05-25 DIAGNOSIS — M1712 Unilateral primary osteoarthritis, left knee: Secondary | ICD-10-CM

## 2014-05-25 DIAGNOSIS — I10 Essential (primary) hypertension: Secondary | ICD-10-CM

## 2014-05-25 DIAGNOSIS — Z23 Encounter for immunization: Secondary | ICD-10-CM

## 2014-05-25 NOTE — Patient Instructions (Addendum)
Glad things are going so well other than the knee  See a me a few weeks before your trip and we can do an injection which hopefully would last 3 months and get you through [place] trip.   Blood pressure borderline in office but good with outside readings-no changes, continue current medicine.   Prevnar (final pneumonia shot by current recommendations)  Remind [place] about a month before urology or dermatology visit Bevelyn Ngo) and we can put in a referral.

## 2014-05-25 NOTE — Progress Notes (Signed)
Barry Reddish, MD Phone: [phone]  Subjective:  Patient presents today to establish care with me as their new primary care provider. Patient was formerly a patient of Dr. Leanne Chang. Chief complaint-noted.   Hypertension-well-controlled BP Readings from Last 3 Encounters:  05/25/14 140/64  01/22/14 160/62  11/11/13 154/74  Home BP monitoring-before he gives blood 118/67.  Compliant with medications-yes without side effects ROS-Denies any CP, HA, SOB, blurry vision, LE edema.   Osteoarthritis of the knee -Knee injection 10/2013. 3-4 months of good relief and pain less severe.  ROS-denies joint warmth, redness  The following were reviewed and entered/updated in epic: Past Medical History  Diagnosis Date  . Colon cancer     precancer-partial colectomy  . Gout   . Hypertension   . Elevated HDL   . Squamous cell skin cancer   . Aortic valve disease   . NEPHROLITHIASIS, HX OF 01/07/2008   Patient Active Problem List   Diagnosis Date Noted  . Aortic stenosis 01/31/2007    Priority: High  . HYPERCHOLESTEROLEMIA WITH HIGH HDL 01/31/2007    Priority: Medium  . Gout 01/31/2007    Priority: Medium  . Essential hypertension 01/31/2007    Priority: Medium  . COLON CANCER, HX OF 01/31/2007    Priority: Medium  . Osteoarthritis of left knee 05/25/2014   Past Surgical History  Procedure Laterality Date  . Lumbar laminectomy    . Facet joint injection  2009  . Partial colectomy  2005    precancerours  . Other surgical history      ruptured ear drum with skin graft    Family History  Problem Relation Age of Onset  . Heart attack Mother 23    former smoker  . Heart attack Father 48    former smoker  . Hypertension Sister   . Heart attack Brother 26    Rheumatic fever    Medications- reviewed and updated Current Outpatient Prescriptions  Medication Sig Dispense Refill  . allopurinol (ZYLOPRIM) 300 MG tablet Take 1 tablet (300 mg total) by mouth daily. 90 tablet 3  .  amLODipine (NORVASC) 5 MG tablet Take 1 tablet (5 mg total) by mouth daily. 90 tablet 3  . aspirin 81 MG tablet Take 81 mg by mouth daily.      . IRON PO Take 65 mg by mouth.    . potassium citrate (UROCIT-K) 10 MEQ (1080 MG) SR tablet Take 1 tablet (10 mEq total) by mouth 3 (three) times daily with meals. 300 tablet 1  . quinapril (ACCUPRIL) 20 MG tablet Take 1 tablet (20 mg total) by mouth daily. 90 tablet 3  . diphenhydrAMINE (SLEEP AID, DIPHENHYDRAMINE,) 25 MG tablet Take 25 mg by mouth at bedtime as needed for sleep.     No current facility-administered medications for this visit.    Allergies-reviewed and updated No Known Allergies  History   Social History  . Marital Status: Married    Spouse Name: N/A    Number of Children: 63  . Years of Education: N/A   Occupational History  . Retired-labor Control and instrumentation engineer    Social History Main Topics  . Smoking status: Former Smoker -- 1.00 packs/day for 20 years    Types: Cigarettes    Quit date: 09/15/1980  . Smokeless tobacco: None  . Alcohol Use: 5.0 oz/week    10 drink(s) per week  . Drug Use: No  . Sexual Activity: None   Other Topics Concern  . None   Social History Narrative  Married. 5 children. 4 grandkids. 1 greatgrandkid.       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       Hobbies: woodworking      Advised to consider advanced directives/hcpoa-thinks wife may have 36 from years ago.    ROS--See HPI   Objective: BP 140/64 mmHg  Pulse 72  Temp(Src) 98.4 F (36.9 C)  Wt 229 lb (103.874 kg) Gen: NAD, resting comfortably HEENT: Mucous membranes are moist. Oropharynx normal. Partials on top, good dentition on bottom.  CV: RRR 4/6 SEM noted best RUSB but heard throughout Lungs: CTAB no crackles, wheeze, rhonchi Abdomen: soft/nontender/nondistended/normal bowel sounds.  Ext: no edema Neuro: grossly normal, moves all extremities, PERRLA   Assessment/Plan:  Essential  hypertension Reasonable control given age and no CAD history. Home readings are even better at 118/67. Continue amlodipine 5 mg and quinapril 20 mg. We discussed would only plan to increase medication if systolic blood pressures regularly above 150  Osteoarthritis of left knee We discussed a corticosteroid injection  become less and less effective over time typically and I reassured him about  last injection lasting only 3-4 months. We could consider another injection today but patient wanted to use the next injection before his trip to [place]. Return in about 6 months to get this injection.   6 months f/u  Orders Placed This Encounter  Procedures  . Pneumococcal conjugate vaccine 13-valent

## 2014-05-25 NOTE — Assessment & Plan Note (Signed)
We discussed a corticosteroid injection  become less and less effective over time typically and I reassured him about  last injection lasting only 3-4 months. We could consider another injection today but patient wanted to use the next injection before his trip to [place]. Return in about 6 months to get this injection.

## 2014-05-25 NOTE — Assessment & Plan Note (Signed)
Reasonable control given age and no CAD history. Home readings are even better at 118/67. Continue amlodipine 5 mg and quinapril 20 mg. We discussed would only plan to increase medication if systolic blood pressures regularly above 150

## 2014-07-23 ENCOUNTER — Telehealth: Payer: Self-pay | Admitting: Family Medicine

## 2014-07-23 NOTE — Telephone Encounter (Signed)
Outpatient Authorization #[id number]  Covered Dates:  10/14/2014 - 04/12/2015 [facility]: Malka So MD  Address: [address], [place], [place] [address]  [phone] Pt appt 10-14-2014@8 :30 referral done

## 2014-07-23 NOTE — Telephone Encounter (Signed)
Completed referral on acuity connect  Outpatient Authorization [id number]  As of 07-23-2014 referral in suspended status (pending authorization ) For pt to see [facility]: Marcene Corning MD  Address: [address], [place], [place] [address]  [phone]

## 2014-10-14 ENCOUNTER — Telehealth: Payer: Self-pay | Admitting: Family Medicine

## 2014-10-14 NOTE — Telephone Encounter (Signed)
authorization  done for pt to see  [facility]  Dr Jeffie [address]  Address: [address], [place], [place] [address]  [phone] Outpatient Authorization #[id number]  10/14/2014 - 04/12/2015

## 2014-11-23 ENCOUNTER — Ambulatory Visit (HOSPITAL_COMMUNITY): Payer: Commercial Managed Care - HMO | Attending: Cardiovascular Disease

## 2014-11-23 ENCOUNTER — Other Ambulatory Visit: Payer: Self-pay | Admitting: Cardiovascular Disease

## 2014-11-23 ENCOUNTER — Other Ambulatory Visit: Payer: Self-pay

## 2014-11-23 DIAGNOSIS — I517 Cardiomegaly: Secondary | ICD-10-CM | POA: Diagnosis not present

## 2014-11-23 DIAGNOSIS — I351 Nonrheumatic aortic (valve) insufficiency: Secondary | ICD-10-CM | POA: Insufficient documentation

## 2014-11-23 DIAGNOSIS — I350 Nonrheumatic aortic (valve) stenosis: Secondary | ICD-10-CM

## 2014-11-23 DIAGNOSIS — I358 Other nonrheumatic aortic valve disorders: Secondary | ICD-10-CM | POA: Diagnosis present

## 2014-11-25 ENCOUNTER — Telehealth: Payer: Self-pay | Admitting: Cardiovascular Disease

## 2014-11-25 NOTE — Telephone Encounter (Signed)
New problem ° ° °Pt returning a call from nurse °

## 2014-11-25 NOTE — Telephone Encounter (Signed)
Called pt back and informed pt of echo results. Pt verbalized understanding.

## 2014-12-03 ENCOUNTER — Encounter: Payer: Self-pay | Admitting: Family Medicine

## 2014-12-03 ENCOUNTER — Ambulatory Visit (INDEPENDENT_AMBULATORY_CARE_PROVIDER_SITE_OTHER): Payer: Commercial Managed Care - HMO | Admitting: Family Medicine

## 2014-12-03 VITALS — BP 158/70 | HR 81 | Temp 98.4°F | Wt 232.0 lb

## 2014-12-03 DIAGNOSIS — M25561 Pain in right knee: Secondary | ICD-10-CM | POA: Diagnosis not present

## 2014-12-03 DIAGNOSIS — M170 Bilateral primary osteoarthritis of knee: Secondary | ICD-10-CM

## 2014-12-03 DIAGNOSIS — M1711 Unilateral primary osteoarthritis, right knee: Secondary | ICD-10-CM

## 2014-12-03 DIAGNOSIS — M25562 Pain in left knee: Secondary | ICD-10-CM

## 2014-12-03 DIAGNOSIS — I10 Essential (primary) hypertension: Secondary | ICD-10-CM

## 2014-12-03 DIAGNOSIS — M1712 Unilateral primary osteoarthritis, left knee: Secondary | ICD-10-CM

## 2014-12-03 MED ORDER — METHYLPREDNISOLONE ACETATE 80 MG/ML IJ SUSP
80.0000 mg | Freq: Once | INTRAMUSCULAR | Status: AC
  Administered 2014-12-03: 80 mg via INTRAMUSCULAR

## 2014-12-03 MED ORDER — DICLOFENAC SODIUM 50 MG PO TBEC: 50.0000 mg | DELAYED_RELEASE_TABLET | Freq: Three times a day (TID) | ORAL | Status: DC | PRN

## 2014-12-03 NOTE — Assessment & Plan Note (Signed)
Readings at goal for [facility] based on [facility] readings when giving blood from 118-142/68-82. We opted to continue home monitoring and consider goal <145/90 which he is meeting (intermediate b/n SPRINT goals and JNC8).

## 2014-12-03 NOTE — Assessment & Plan Note (Addendum)
Bilateral arthritis L >R. Injection today bilaterally with immediate relief down to 0/10 on the left. Hope this helps him on his [place] trip. Also may trial voltaren instead of aleve for prn use when pain inevitably recurs.

## 2014-12-03 NOTE — Patient Instructions (Signed)
BP high in office but looks great on home readings once again. Continue home monitoring. If gets above 145/90 on more than 1 or 2 occasions call me as may bump up your medicine.   Injections hopefully last 3 months.   Can try voltaren instead of aleve but don't take both on the same day

## 2014-12-03 NOTE — Progress Notes (Signed)
Garret Reddish, MD  Subjective:  Barry Decker is a 81 y.o. year old very pleasant male patient who presents with:  Osteoarthritis of Left Knee (also R)- stable but troublesome 4/10 pain down with walking on left and similar but slightly less on right. Aleve helps mildly. Requests alternative. . Steroid injection last helped for 3-4 months. Going to [place] and will be doing a lot of walking and requests repeat.  ROS- no hot swollen joint, no polyarthralgia   Hypertension-poor control in office, reasonable control on home readings  BP Readings from Last 3 Encounters:  12/03/14 158/70  05/25/14 140/64  01/22/14 160/62   Home BP monitoring-Blood donations by RN 118/68. 140/70, 142/82, 138/64.  Compliant with medications-yes without side effects, amlodipine 5mg  and quinapril 20mg . ROS-Denies any CP, HA, SOB, blurry vision, LE edema, transient weakness, orthopnea, PND.   Past Medical History- moderate aortic stenosis with recent echo stable, HLD, gout, HTN  Medications- reviewed and updated Current Outpatient Prescriptions  Medication Sig Dispense Refill  . allopurinol (ZYLOPRIM) 300 MG tablet Take 1 tablet (300 mg total) by mouth daily. 90 tablet 3  . amLODipine (NORVASC) 5 MG tablet Take 1 tablet (5 mg total) by mouth daily. 90 tablet 3  . aspirin 81 MG tablet Take 81 mg by mouth daily.      . IRON PO Take 65 mg by mouth.    . potassium citrate (UROCIT-K) 10 MEQ (1080 MG) SR tablet Take 1 tablet (10 mEq total) by mouth 3 (three) times daily with meals. 300 tablet 1  . quinapril (ACCUPRIL) 20 MG tablet Take 1 tablet (20 mg total) by mouth daily. 90 tablet 3  . diclofenac (VOLTAREN) 50 MG EC tablet Take 1 tablet (50 mg total) by mouth 3 (three) times daily as needed. 60 tablet 0  . diphenhydrAMINE (SLEEP AID, DIPHENHYDRAMINE,) 25 MG tablet Take 25 mg by mouth at bedtime as needed for sleep.     No current facility-administered medications for this visit.    Objective: BP 158/70 mmHg   Pulse 81  Temp(Src) 98.4 F (36.9 C)  Wt 232 lb (105.235 kg) Gen: NAD, resting comfortably CV: RRR no murmurs rubs or gallops Lungs: CTAB no crackles, wheeze, rhonchi Abdomen: soft/nontender/nondistended/normal bowel sounds.  Ext: no edema Skin: warm, dry, no rash  Bilateral Knee: Normal to inspection with no erythema or effusion or obvious bony abnormalities. Palpation normal with no warmth. Mild medial joint line tenderness .  ROM normal in flexion and extension and lower leg rotation. Ligaments with solid consistent endpoints including ACL, PCL, LCL, MCL. Negative Mcmurray's  Patellar and quadriceps tendons unremarkable. Hamstring and quadriceps strength is normal.   Bilateral knee injection  Consent verbally obtained and verified. Sterile betadine prep. Furthur cleansed with alcohol. Topical analgesic spray: Ethyl chloride. Joint: Knee R and L Approached in typical fashion with: medial approach Completed without difficulty Meds: 4 cc lidocaine and 1cc of 80mg /cc depomedrol on L; 3 cc lidocaine and 1 cc of 80mg /cc depomedrol on R Needle: 25 gauge 1 1/2 inch Aftercare instructions and Red flags advised verbally  Assessment/Plan:  Essential hypertension Readings at goal for [facility] based on red cross readings when giving blood from 118-142/68-82. We opted to continue home monitoring and consider goal <145/90 which he is meeting (intermediate b/n SPRINT goals and JNC8).   Osteoarthritis of left knee and right knee Bilateral arthritis L >R. Injection today bilaterally with immediate relief down to 0/10 on the left. Hope this helps him on his [place]  trip. Also may trial voltaren instead of aleve for prn use when pain inevitably recurs.    Meds ordered this encounter  Medications  . methylPREDNISolone acetate (DEPO-MEDROL) injection 80 mg    Sig:   . diclofenac (VOLTAREN) 50 MG EC tablet    Sig: Take 1 tablet (50 mg total) by mouth 3 (three) times daily as needed.     Dispense:  60 tablet    Refill:  0

## 2015-02-03 NOTE — Progress Notes (Signed)
Chief Complaint  Patient presents with  . Shortness of Breath      History of Present Illness: 81 yo male with history of HTN, HLD, aortic stenosis, aortic insufficiency here today for cardiac follow up. I saw him as a new patient August 2015. He has been known to have AS for many years. He has a history of rheumatic fever as a child with murmur noted his entire life. He has been followed in [place] until he moved here several years ago. Last echo June 2015 with normal LVEF, mildly dilated LV, moderate AS (mean gradient 23 mm Hg), moderate AI, mildly dilated aortic root. We elected to follow his AS with yearly echocardiograms.   He is here today for follow up. He feels well. No chest pain or SOB. No LE edema. No dizziness or near syncope.   Primary Care Physician: Phoebe Sharps  Last Lipid Profile:Lipid Panel     Component Value Date/Time   CHOL 195 11/11/2013 1014   TRIG 60.0 11/11/2013 1014   TRIG 87 06/22/2006 1013   HDL 70.80 11/11/2013 1014   CHOLHDL 3 11/11/2013 1014   CHOLHDL 4.0 CALC 06/22/2006 1013   VLDL 12.0 11/11/2013 1014   LDLCALC 112* 11/11/2013 1014     Past Medical History  Diagnosis Date  . Colon cancer     precancer-partial colectomy  . Gout   . Hypertension   . Elevated HDL   . Squamous cell skin cancer   . Aortic valve disease   . NEPHROLITHIASIS, HX OF 01/07/2008    Past Surgical History  Procedure Laterality Date  . Lumbar laminectomy    . Facet joint injection  2009  . Partial colectomy  2005    precancerours  . Other surgical history      ruptured ear drum with skin graft    Current Outpatient Prescriptions  Medication Sig Dispense Refill  . allopurinol (ZYLOPRIM) 300 MG tablet Take 1 tablet (300 mg total) by mouth daily. 90 tablet 3  . amLODipine (NORVASC) 5 MG tablet Take 1 tablet (5 mg total) by mouth daily. 90 tablet 3  . aspirin 81 MG tablet Take 81 mg by mouth daily.      . diclofenac (VOLTAREN) 50 MG EC tablet Take 1 tablet (50  mg total) by mouth 3 (three) times daily as needed. 60 tablet 0  . diphenhydrAMINE (SLEEP AID, DIPHENHYDRAMINE,) 25 MG tablet Take 25 mg by mouth at bedtime as needed for sleep.    . IRON PO Take 65 mg by mouth.    . potassium citrate (UROCIT-K) 10 MEQ (1080 MG) SR tablet Take 1 tablet (10 mEq total) by mouth 3 (three) times daily with meals. 300 tablet 1  . quinapril (ACCUPRIL) 20 MG tablet Take 1 tablet (20 mg total) by mouth daily. 90 tablet 3   No current facility-administered medications for this visit.    No Known Allergies  Social History   Social History  . Marital Status: Married    Spouse Name: N/A  . Number of Children: 5  . Years of Education: N/A   Occupational History  . Retired-labor Control and instrumentation engineer    Social History Main Topics  . Smoking status: Former Smoker -- 1.00 packs/day for 20 years    Types: Cigarettes    Quit date: 09/15/1980  . Smokeless tobacco: Not on file  . Alcohol Use: 5.0 oz/week    10 drink(s) per week  . Drug Use: No  . Sexual Activity: Not on file  Other Topics Concern  . Not on file   Social History Narrative   Married. 5 children. 4 grandkids. 1 greatgrandkid.       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       Hobbies: woodworking      Advised to consider advanced directives/hcpoa-thinks wife may have 97 from years ago.    Family History  Problem Relation Age of Onset  . Heart attack Mother 35    former smoker  . Heart attack Father 57    former smoker  . Hypertension Sister   . Heart attack Brother 26    Rheumatic fever    Review of Systems:  As stated in the HPI and otherwise negative.   BP 150/72 mmHg  Pulse 69  Ht 6\' 1"  (1.854 m)  Wt 229 lb 12.8 oz (104.237 kg)  BMI 30.33 kg/m2  SpO2 95%  Physical Examination: General: Well developed, well nourished, NAD HEENT: OP clear, mucus membranes moist SKIN: warm, dry. No rashes. Neuro: No focal deficits Musculoskeletal: Muscle strength 5/5 all  ext Psychiatric: Mood and affect normal Neck: No JVD, no carotid bruits, no thyromegaly, no lymphadenopathy. Lungs:Clear bilaterally, no wheezes, rhonci, crackles Cardiovascular: Regular rate and rhythm. Loud harsh systolic murmur. No gallops or rubs. Abdomen:Soft. Bowel sounds present. Non-tender.  Extremities: No lower extremity edema. Pulses are 2 + in the bilateral DP/PT.  Echo 11/23/14: Left ventricle: The cavity size was normal. There was mild concentric hypertrophy. Systolic function was normal. The estimated ejection fraction was in the range of 60% to 65%. Wall motion was normal; there were no regional wall motion abnormalities. Doppler parameters are consistent with abnormal left ventricular relaxation (grade 1 diastolic dysfunction). - Aortic valve: There was moderate stenosis. There was moderate regurgitation. Valve area (VTI): 1.57 cm^2. Valve area (Vmax): 1.78 cm^2. Valve area (Vmean): 1.64 cm^2. - Aortic root: The aortic root was mildly dilated. - Ascending aorta: The ascending aorta was mildly dilated. - Left atrium: The atrium was mildly dilated.  EKG:  EKG is ordered today. The ekg ordered today demonstrates Sinus, rate 69 bpm. 1st degree AV block. LVH  Recent Labs: No results found for requested labs within last 365 days.   Lipid Panel    Component Value Date/Time   CHOL 195 11/11/2013 1014   TRIG 60.0 11/11/2013 1014   TRIG 87 06/22/2006 1013   HDL 70.80 11/11/2013 1014   CHOLHDL 3 11/11/2013 1014   CHOLHDL 4.0 CALC 06/22/2006 1013   VLDL 12.0 11/11/2013 1014   LDLCALC 112* 11/11/2013 1014   LDLDIRECT 136.2 10/25/2012 1009   LDLDIRECT 135.2 06/22/2006 1013     Wt Readings from Last 3 Encounters:  02/04/15 229 lb 12.8 oz (104.237 kg)  12/03/14 232 lb (105.235 kg)  05/25/14 229 lb (103.874 kg)     Other studies Reviewed: Additional studies/ records that were reviewed today include: . Review of the above records demonstrates:    Assessment and Plan:   1. Aortic stenosis: Moderate stenosis by echo June 2016 with mean gradient 23 mm Hg, peak gradient 46 mm Hg, moderate AI. He is completely asymptomatic. I have spent a large portion of the visit reviewing the etiology of aortic stenosis. His is most likely related to history of rheumatic fever. He has moderate AI. No big changes in last 2 years on serial echo. No indication for surgery at this time. I have reviewed the potential future need for AVR with TAVR as an option. Repeat echo June 2017.

## 2015-02-04 ENCOUNTER — Encounter: Payer: Self-pay | Admitting: Cardiovascular Disease

## 2015-02-04 ENCOUNTER — Ambulatory Visit (INDEPENDENT_AMBULATORY_CARE_PROVIDER_SITE_OTHER): Payer: Commercial Managed Care - HMO | Admitting: Cardiovascular Disease

## 2015-02-04 VITALS — BP 150/72 | HR 69 | Ht 73.0 in | Wt 229.8 lb

## 2015-02-04 DIAGNOSIS — I350 Nonrheumatic aortic (valve) stenosis: Secondary | ICD-10-CM | POA: Diagnosis not present

## 2015-02-04 NOTE — Patient Instructions (Signed)
Medication Instructions:  Your physician recommends that you continue on your current medications as directed. Please refer to the Current Medication list given to you today.   Labwork: none  Testing/Procedures:   Your physician has requested that you have an echocardiogram. Echocardiography is a painless test that uses sound waves to create images of your heart. It provides your doctor with information about the size and shape of your heart and how well your heart's chambers and valves are working. This procedure takes approximately one hour. There are no restrictions for this procedure. To be done in June 2017   Follow-Up: Your physician wants you to follow-up in: 12 months.  You will receive a reminder letter in the mail two months in advance. If you don't receive a letter, please call our office to schedule the follow-up appointment.   Any Other Special Instructions Will Be Listed Below (If Applicable).

## 2015-03-04 ENCOUNTER — Telehealth: Payer: Self-pay | Admitting: Family Medicine

## 2015-03-04 ENCOUNTER — Other Ambulatory Visit: Payer: Self-pay | Admitting: Internal Medicine

## 2015-03-05 NOTE — Telephone Encounter (Signed)
Pt called about a referral, but it was done.

## 2015-06-20 HISTORY — PX: CATARACT EXTRACTION W/ INTRAOCULAR LENS  IMPLANT, BILATERAL: SHX1307

## 2015-06-24 ENCOUNTER — Telehealth: Payer: Self-pay | Admitting: Family Medicine

## 2015-06-24 MED ORDER — ALLOPURINOL 300 MG PO TABS: 300.0000 mg | ORAL_TABLET | Freq: Every day | ORAL | Status: DC

## 2015-06-24 MED ORDER — QUINAPRIL HCL 20 MG PO TABS: 20.0000 mg | ORAL_TABLET | Freq: Every day | ORAL | Status: DC

## 2015-06-24 MED ORDER — AMLODIPINE BESYLATE 5 MG PO TABS: 5.0000 mg | ORAL_TABLET | Freq: Every day | ORAL | Status: DC

## 2015-06-24 NOTE — Telephone Encounter (Signed)
Medication refilled and pharmacy updated.

## 2015-06-24 NOTE — Telephone Encounter (Signed)
Patient has switched from [facility] insurance to [employer]  He needs to change all his pharmacies to [facility] (mail order).  [facility]-  Fax # [phone]  Patient also needs Allopurinol, Quinapril, and Anlodipine refilled through [facility].

## 2015-07-06 ENCOUNTER — Ambulatory Visit (INDEPENDENT_AMBULATORY_CARE_PROVIDER_SITE_OTHER): Payer: PPO | Admitting: Family Medicine

## 2015-07-06 ENCOUNTER — Encounter: Payer: Self-pay | Admitting: Family Medicine

## 2015-07-06 VITALS — BP 130/62 | HR 72 | Temp 98.7°F | Wt 240.0 lb

## 2015-07-06 DIAGNOSIS — M1712 Unilateral primary osteoarthritis, left knee: Secondary | ICD-10-CM | POA: Diagnosis not present

## 2015-07-06 MED ORDER — METHYLPREDNISOLONE ACETATE 80 MG/ML IJ SUSP
80.0000 mg | Freq: Once | INTRAMUSCULAR | Status: AC
  Administered 2015-07-06: 80 mg via INTRAMUSCULAR

## 2015-07-06 NOTE — Progress Notes (Signed)
Barry Reddish, MD  Subjective:  [name] is a 82 y.o. year old very pleasant male patient who presents with:  See problem oriented charting ROS- no hot swollen joint, no polyarthralgia (no recent gout flares). Does also have some mild right knee pain. No chest pain or shortness of breath  Past Medical History- moderate aortic stenosis followed by cardiology, HLD, gout, HTN  Medications- reviewed and updated Current Outpatient Prescriptions  Medication Sig Dispense Refill  . allopurinol (ZYLOPRIM) 300 MG tablet Take 1 tablet (300 mg total) by mouth daily. 90 tablet 3  . amLODipine (NORVASC) 5 MG tablet Take 1 tablet (5 mg total) by mouth daily. 90 tablet 3  . aspirin 81 MG tablet Take 81 mg by mouth daily.      . IRON PO Take 65 mg by mouth.    . potassium citrate (UROCIT-K) 10 MEQ (1080 MG) SR tablet Take 1 tablet (10 mEq total) by mouth 3 (three) times daily with meals. 300 tablet 1  . quinapril (ACCUPRIL) 20 MG tablet Take 1 tablet (20 mg total) by mouth daily. 90 tablet 3  . diclofenac (VOLTAREN) 50 MG EC tablet Take 1 tablet (50 mg total) by mouth 3 (three) times daily as needed. (Patient not taking: Reported on 07/06/2015) 60 tablet 0  . diphenhydrAMINE (SLEEP AID, DIPHENHYDRAMINE,) 25 MG tablet Take 25 mg by mouth at bedtime as needed for sleep. Reported on 07/06/2015     No current facility-administered medications for this visit.    Objective: BP 130/62 mmHg  Pulse 72  Temp(Src) 98.7 F (37.1 C)  Wt 240 lb (108.863 kg) Gen: NAD, resting comfortably CV: RRR no murmurs rubs or gallops Lungs: CTAB no crackles, wheeze, rhonchi Ext: no edema Skin: warm, dry, no rash  Bilateral Knee: Normal to inspection with no erythema or effusion or obvious bony abnormalities. Palpation normal with no warmth. Mild medial joint line tenderness .  ROM normal in flexion and extension and lower leg rotation. Ligaments with solid consistent endpoints including ACL, PCL, LCL,  MCL. Negative Mcmurray's  Patellar and quadriceps tendons unremarkable. Hamstring and quadriceps strength is normal.   Left knee injection  Consent verbally obtained and verified. Sterile betadine prep. Furthur cleansed with alcohol. Topical analgesic spray: Ethyl chloride. Joint: Left knee Approached in typical fashion with: medial approach Completed without difficulty Meds: 4 cc lidocaine and 1cc of 80mg /cc depomedrol on L Needle: 25 gauge 1 1/2 inch Aftercare instructions and Red flags advised verbally  Assessment/Plan:  Osteoarthritis of left knee S: Patient did well with 4-5 months of relief with last steroid injection. Since that time left knee pain has gradually worsened again now up to 4/10 with most walking. Right knee was also injected last time but is really having minimal issues with it (does not desire injection)  If carrying something can increase to 7-8/10. Only used voltaren a few times and has for sparing use. He did well with [place] trip and was able to tolerate the increased walking thanks to injection A/P: bilateral arthritis but more pronounced pain in left at present. Left knee injection completed today. He is also interested in viscous supplementation if these steroid injections begin to last less than 3 months so we will monitor closely. Has voltaren if needed but rarely uses. Follow up as needed for knee pain- if lasts shorter than 3 months refer to sports medicine (Dr. Paulla Fore or Dr. Tamala Julian patient has no preference)   Barry Decker will enter order for depo medrol 80mg /cc with  1 cc given No orders of the defined types were placed in this encounter.

## 2015-07-06 NOTE — Patient Instructions (Signed)
Blood pressure finally lining up with your red cross readings!   Injections hopefully last 3 months. The longer the better- most we can do is every 3 months. We discussed if it does not- refer to orthopedics (you can just call for this and I will refer you to orthopedics). We should also probably keep an eye on your blood sugar in future visits and steroids can raise blood sugar.   If redness, increased swelling, worsening knee pain more than 24 hours out- please return to care   Can try voltaren if needed (not along with aleve) when this starts to wear off.

## 2015-07-06 NOTE — Assessment & Plan Note (Addendum)
S: Patient did well with 4-5 months of relief with last steroid injection. Since that time left knee pain has gradually worsened again now up to 4/10 with most walking. Right knee was also injected last time but is really having minimal issues with it (does not desire injection)  If carrying something can increase to 7-8/10. Only used voltaren a few times and has for sparing use. He did well with [place] trip and was able to tolerate the increased walking thanks to injection A/P: bilateral arthritis but more pronounced pain in left at present. Left knee injection completed today. He is also interested in viscous supplementation if these steroid injections begin to last less than 3 months so we will monitor closely. Has voltaren if needed but rarely uses. Follow up as needed for knee pain- if lasts shorter than 3 months refer to sports medicine (Dr. Paulla Fore or Dr. Tamala Julian patient has no preference)

## 2015-07-28 ENCOUNTER — Telehealth: Payer: Self-pay | Admitting: Family Medicine

## 2015-07-28 DIAGNOSIS — M1712 Unilateral primary osteoarthritis, left knee: Secondary | ICD-10-CM

## 2015-07-28 NOTE — Telephone Encounter (Signed)
Pt call to say his knee is not better and that you told him if his knee was not any better you would refer him to a orthapedic doctor

## 2015-07-29 NOTE — Telephone Encounter (Signed)
I called the pt and informed him the referral was placed and someone will call with appt information.

## 2015-07-29 NOTE — Telephone Encounter (Signed)
Yes please refer

## 2015-08-02 DIAGNOSIS — M1712 Unilateral primary osteoarthritis, left knee: Secondary | ICD-10-CM | POA: Diagnosis not present

## 2015-08-06 DIAGNOSIS — M1712 Unilateral primary osteoarthritis, left knee: Secondary | ICD-10-CM | POA: Diagnosis not present

## 2015-08-13 DIAGNOSIS — M1712 Unilateral primary osteoarthritis, left knee: Secondary | ICD-10-CM | POA: Diagnosis not present

## 2015-08-20 DIAGNOSIS — M1712 Unilateral primary osteoarthritis, left knee: Secondary | ICD-10-CM | POA: Diagnosis not present

## 2015-08-31 DIAGNOSIS — M47812 Spondylosis without myelopathy or radiculopathy, cervical region: Secondary | ICD-10-CM | POA: Insufficient documentation

## 2015-08-31 DIAGNOSIS — R070 Pain in throat: Secondary | ICD-10-CM | POA: Diagnosis not present

## 2015-08-31 DIAGNOSIS — H9311 Tinnitus, right ear: Secondary | ICD-10-CM | POA: Diagnosis not present

## 2015-09-29 DIAGNOSIS — M1712 Unilateral primary osteoarthritis, left knee: Secondary | ICD-10-CM | POA: Diagnosis not present

## 2015-10-08 ENCOUNTER — Ambulatory Visit (INDEPENDENT_AMBULATORY_CARE_PROVIDER_SITE_OTHER): Payer: PPO | Admitting: Cardiovascular Disease

## 2015-10-08 VITALS — BP 152/68 | HR 63 | Ht 73.0 in | Wt 234.8 lb

## 2015-10-08 DIAGNOSIS — I350 Nonrheumatic aortic (valve) stenosis: Secondary | ICD-10-CM

## 2015-10-08 DIAGNOSIS — I10 Essential (primary) hypertension: Secondary | ICD-10-CM | POA: Diagnosis not present

## 2015-10-08 MED ORDER — AMLODIPINE BESYLATE 10 MG PO TABS: 10.0000 mg | ORAL_TABLET | Freq: Every day | ORAL | Status: DC

## 2015-10-08 NOTE — Progress Notes (Signed)
Chief Complaint  Patient presents with  . Medical Clearance    Partial Knee replacement on May 30th, pt denied chest pain and SOB     History of Present Illness: 82 yo male with history of HTN, HLD, aortic stenosis, aortic insufficiency here today for cardiac follow up. I saw him as a new patient August 2015. He has been known to have AS for many years. He has a history of rheumatic fever as a child with murmur noted his entire life. He has been followed in [place] until he moved here several years ago. Last echo June 2015 with normal LVEF, mildly dilated LV, moderate AS (mean gradient 23 mm Hg), moderate AI, mildly dilated aortic root. We elected to follow his AS with yearly echocardiograms.   He is here today for follow up. He feels well. No chest pain or SOB. No LE edema. No dizziness or near syncope. He has an upcoming knee replacement.   Primary Care Physician: Phoebe Sharps   Past Medical History  Diagnosis Date  . Colon cancer ([place])     precancer-partial colectomy  . Gout   . Hypertension   . Elevated HDL   . Squamous cell skin cancer   . Aortic valve disease   . NEPHROLITHIASIS, HX OF 01/07/2008    Past Surgical History  Procedure Laterality Date  . Lumbar laminectomy    . Facet joint injection  2009  . Partial colectomy  2005    precancerours  . Other surgical history      ruptured ear drum with skin graft    Current Outpatient Prescriptions  Medication Sig Dispense Refill  . allopurinol (ZYLOPRIM) 300 MG tablet Take 1 tablet (300 mg total) by mouth daily. 90 tablet 3  . amLODipine (NORVASC) 10 MG tablet Take 1 tablet (10 mg total) by mouth daily. 90 tablet 3  . aspirin 81 MG tablet Take 81 mg by mouth daily.      . diphenhydrAMINE (SLEEP AID, DIPHENHYDRAMINE,) 25 MG tablet Take 25 mg by mouth at bedtime as needed for sleep. Reported on 07/06/2015    . IRON PO Take 65 mg by mouth.    . potassium citrate (UROCIT-K) 10 MEQ (1080 MG) SR tablet Take 1 tablet (10 mEq  total) by mouth 3 (three) times daily with meals. 300 tablet 1  . quinapril (ACCUPRIL) 20 MG tablet Take 1 tablet (20 mg total) by mouth daily. 90 tablet 3   No current facility-administered medications for this visit.    No Known Allergies  Social History   Social History  . Marital Status: Married    Spouse Name: N/A  . Number of Children: 5  . Years of Education: N/A   Occupational History  . Retired-labor Control and instrumentation engineer    Social History Main Topics  . Smoking status: Former Smoker -- 1.00 packs/day for 20 years    Types: Cigarettes    Quit date: 09/15/1980  . Smokeless tobacco: Not on file  . Alcohol Use: 5.0 oz/week    10 drink(s) per week  . Drug Use: No  . Sexual Activity: Not on file   Other Topics Concern  . Not on file   Social History Narrative   Married. 5 children. 4 grandkids. 1 greatgrandkid.       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       Hobbies: woodworking      Advised to consider advanced directives/hcpoa-thinks wife may have 27 from years ago.    Family  History  Problem Relation Age of Onset  . Heart attack Mother 35    former smoker  . Heart attack Father 10    former smoker  . Hypertension Sister   . Heart attack Brother 26    Rheumatic fever    Review of Systems:  As stated in the HPI and otherwise negative.   BP 152/68 mmHg  Pulse 63  Ht 6\' 1"  (1.854 m)  Wt 234 lb 12.8 oz (106.505 kg)  BMI 30.98 kg/m2  Physical Examination: General: Well developed, well nourished, NAD HEENT: OP clear, mucus membranes moist SKIN: warm, dry. No rashes. Neuro: No focal deficits Musculoskeletal: Muscle strength 5/5 all ext Psychiatric: Mood and affect normal Neck: No JVD, no carotid bruits, no thyromegaly, no lymphadenopathy. Lungs:Clear bilaterally, no wheezes, rhonci, crackles Cardiovascular: Regular rate and rhythm. Loud harsh systolic murmur. No gallops or rubs. Abdomen:Soft. Bowel sounds present. Non-tender.    Extremities: No lower extremity edema. Pulses are 2 + in the bilateral DP/PT.  Echo 11/23/14: Left ventricle: The cavity size was normal. There was mild concentric hypertrophy. Systolic function was normal. The estimated ejection fraction was in the range of 60% to 65%. Wall motion was normal; there were no regional wall motion abnormalities. Doppler parameters are consistent with abnormal left ventricular relaxation (grade 1 diastolic dysfunction). - Aortic valve: There was moderate stenosis. There was moderate regurgitation. Valve area (VTI): 1.57 cm^2. Valve area (Vmax): 1.78 cm^2. Valve area (Vmean): 1.64 cm^2. - Aortic root: The aortic root was mildly dilated. - Ascending aorta: The ascending aorta was mildly dilated. - Left atrium: The atrium was mildly dilated.  EKG:  EKG is  ordered today. The ekg ordered today demonstrates NSR with rate 63 bpm. 1st degree AV block. PVC. LAFB. LVH.   Recent Labs: No results found for requested labs within last 365 days.   Lipid Panel    Component Value Date/Time   CHOL 195 11/11/2013 1014   TRIG 60.0 11/11/2013 1014   TRIG 87 06/22/2006 1013   HDL 70.80 11/11/2013 1014   CHOLHDL 3 11/11/2013 1014   CHOLHDL 4.0 CALC 06/22/2006 1013   VLDL 12.0 11/11/2013 1014   LDLCALC 112* 11/11/2013 1014   LDLDIRECT 136.2 10/25/2012 1009   LDLDIRECT 135.2 06/22/2006 1013     Wt Readings from Last 3 Encounters:  10/08/15 234 lb 12.8 oz (106.505 kg)  07/06/15 240 lb (108.863 kg)  02/04/15 229 lb 12.8 oz (104.237 kg)     Other studies Reviewed: Additional studies/ records that were reviewed today include: . Review of the above records demonstrates:   Assessment and Plan:   1. Aortic stenosis: Moderate stenosis by echo June 2016 with mean gradient 23 mm Hg, peak gradient 46 mm Hg, moderate AI. He is completely asymptomatic. I have spent a large portion of the visit reviewing the etiology of aortic stenosis. His is most likely  related to history of rheumatic fever. He has moderate AI. No big changes in last 2 years on serial echo. No indication for aortic valve replacement at this time but he has an upcoming knee surgery. Will repeat echo now to make sure aortic stenosis has not gotten worse before he proceeds with general anesthesia for his knee surgery. I have reviewed the potential future need for AVR with TAVR as an option.   2. HTN: BP elevated today and at home. Will increase Norvasc to 10 mg daily.   Current medicines are reviewed at length with the patient today.  The patient  does not have concerns regarding medicines.  The following changes have been made:  no change  Labs/ tests ordered today include:   Orders Placed This Encounter  Procedures  . EKG 12-Lead  . Echocardiogram     Disposition:   FU with me in 6 months   Signed, Lauree Chandler, MD 10/08/2015 11:39 AM    [facility] [address], [place], [place]  [address] Phone: [phone]; Fax: [phone]

## 2015-10-08 NOTE — Addendum Note (Signed)
Addended by: Thompson Grayer on: 10/08/2015 11:09 AM   Modules accepted: Orders

## 2015-10-08 NOTE — Patient Instructions (Signed)
Medication Instructions:  Your physician has recommended you make the following change in your medication:  Increase amlodipine to 10 mg by mouth daily.   Labwork: none  Testing/Procedures: Your physician has requested that you have an echocardiogram. Echocardiography is a painless test that uses sound waves to create images of your heart. It provides your doctor with information about the size and shape of your heart and how well your heart's chambers and valves are working. This procedure takes approximately one hour. There are no restrictions for this procedure.    Follow-Up: Your physician wants you to follow-up in: 6 months.  You will receive a reminder letter in the mail two months in advance. If you don't receive a letter, please call our office to schedule the follow-up appointment.   Any Other Special Instructions Will Be Listed Below (If Applicable).     If you need a refill on your cardiac medications before your next appointment, please call your pharmacy.

## 2015-10-13 ENCOUNTER — Telehealth: Payer: Self-pay | Admitting: *Deleted

## 2015-10-13 DIAGNOSIS — M1712 Unilateral primary osteoarthritis, left knee: Secondary | ICD-10-CM | POA: Diagnosis not present

## 2015-10-13 NOTE — Telephone Encounter (Signed)
Barry Decker  10/08/2015 10:30 AM  Office Visit  MRN:  [id number]   Description: Male DOB: Jun 16, 1933  Provider: [name], MD  Department: [facility] [facility]       Vital Signs  Most recent update: 10/08/2015 10:47 AM by Vennie Homans    BP Pulse Ht Wt BMI    152/68 mmHg 63 6\' 1"  (1.854 m) 234 lb 12.8 oz (106.505 kg) 30.98 kg/m2    Vitals History     Progress Notes      [name], MD at 10/08/2015 6:40 AM      Assessment and Plan:   1. Aortic stenosis: Moderate stenosis by echo June 2016 with mean gradient 23 mm Hg, peak gradient 46 mm Hg, moderate AI. He is completely asymptomatic. I have spent a large portion of the visit reviewing the etiology of aortic stenosis. His is most likely related to history of rheumatic fever. He has moderate AI. No big changes in last 2 years on serial echo. No indication for aortic valve replacement at this time but he has an upcoming knee surgery. Will repeat echo now to make sure aortic stenosis has not gotten worse before he proceeds with general anesthesia for his knee surgery. I have reviewed the potential future need for AVR with TAVR as an option.   2. HTN: BP elevated today and at home. Will increase Norvasc to 10 mg daily.   called to verifiy that amlodipine was changed from 5 mg to 10 mg. spoke with Mateo Flow & confirmed the 10 mg change.

## 2015-10-18 DIAGNOSIS — Z0000 Encounter for general adult medical examination without abnormal findings: Secondary | ICD-10-CM | POA: Diagnosis not present

## 2015-10-18 DIAGNOSIS — N200 Calculus of kidney: Secondary | ICD-10-CM | POA: Diagnosis not present

## 2015-10-26 ENCOUNTER — Ambulatory Visit (HOSPITAL_COMMUNITY): Payer: PPO | Attending: Cardiology

## 2015-10-26 ENCOUNTER — Ambulatory Visit (INDEPENDENT_AMBULATORY_CARE_PROVIDER_SITE_OTHER): Payer: PPO | Admitting: Family Medicine

## 2015-10-26 ENCOUNTER — Encounter: Payer: Self-pay | Admitting: Family Medicine

## 2015-10-26 ENCOUNTER — Other Ambulatory Visit: Payer: Self-pay

## 2015-10-26 VITALS — BP 146/64 | HR 72 | Temp 98.1°F | Wt 237.0 lb

## 2015-10-26 DIAGNOSIS — I119 Hypertensive heart disease without heart failure: Secondary | ICD-10-CM | POA: Insufficient documentation

## 2015-10-26 DIAGNOSIS — Z8249 Family history of ischemic heart disease and other diseases of the circulatory system: Secondary | ICD-10-CM | POA: Diagnosis not present

## 2015-10-26 DIAGNOSIS — E7800 Pure hypercholesterolemia, unspecified: Secondary | ICD-10-CM | POA: Diagnosis not present

## 2015-10-26 DIAGNOSIS — I059 Rheumatic mitral valve disease, unspecified: Secondary | ICD-10-CM | POA: Diagnosis not present

## 2015-10-26 DIAGNOSIS — I352 Nonrheumatic aortic (valve) stenosis with insufficiency: Secondary | ICD-10-CM | POA: Diagnosis not present

## 2015-10-26 DIAGNOSIS — I10 Essential (primary) hypertension: Secondary | ICD-10-CM | POA: Diagnosis not present

## 2015-10-26 DIAGNOSIS — Z87891 Personal history of nicotine dependence: Secondary | ICD-10-CM | POA: Insufficient documentation

## 2015-10-26 DIAGNOSIS — I350 Nonrheumatic aortic (valve) stenosis: Secondary | ICD-10-CM

## 2015-10-26 DIAGNOSIS — E785 Hyperlipidemia, unspecified: Secondary | ICD-10-CM | POA: Insufficient documentation

## 2015-10-26 DIAGNOSIS — I77810 Thoracic aortic ectasia: Secondary | ICD-10-CM | POA: Diagnosis not present

## 2015-10-26 LAB — ECHOCARDIOGRAM COMPLETE: Weight: 3792 oz

## 2015-10-26 NOTE — Patient Instructions (Addendum)
Increase quinapril to 2 tablets in the morning, continue amlodipine 10mg  in the morning. See me back next week to reevaluate blood pressure. Goal <140/90 before surgery.   Please bring your home cuff and your log of blood pressures  Everything looks great. We discussed perioperative statin (around the time of surgery) but opted against ultimately in joint decision making  For check out desk- get either the 9 15 or 9 30 slot for next Wednesday morning 11/03/15

## 2015-10-26 NOTE — Assessment & Plan Note (Signed)
S: slightly poorly controlled LDL without statin but excellent HDL and total. No myalgias.  Lab Results  Component Value Date   CHOL 195 11/11/2013   HDL 70.80 11/11/2013   LDLCALC 112* 11/11/2013   LDLDIRECT 136.2 10/25/2012   TRIG 60.0 11/11/2013   CHOLHDL 3 11/11/2013   A/P: we discussed primary prevention benefit unclear at age 82. Discussed perioperative statin as an option- we ultimately decided jointly not to pursue with excellent HDL

## 2015-10-26 NOTE — Assessment & Plan Note (Signed)
S: poorly controlled on amlodipine 10mg  and quinapril 20mg  BP Readings from Last 3 Encounters:  10/26/15 146/64  10/08/15 152/68  07/06/15 130/62  A/P:Continue current meds:  But increase quinapril to 40mg  and follow up next week. Just had amlodipine increased but prefer <140/90 going into surgery. Check bmet at follow up

## 2015-10-26 NOTE — Progress Notes (Signed)
Subjective:  Barry Decker is a 82 y.o. year old very pleasant male patient who presents for/with See problem oriented charting ROS- No chest pain or shortness of breath. No headache or blurry vision. Does have knee pain. .see any ROS included in HPI as well.   Past Medical History-  Patient Active Problem List   Diagnosis Date Noted  . Aortic stenosis 01/31/2007    Priority: High  . HYPERCHOLESTEROLEMIA WITH HIGH HDL 01/31/2007    Priority: Medium  . Gout 01/31/2007    Priority: Medium  . Essential hypertension 01/31/2007    Priority: Medium  . COLON CANCER, HX OF 01/31/2007    Priority: Medium  . Osteoarthritis of left knee 05/25/2014    Priority: Low    Medications- reviewed and updated Current Outpatient Prescriptions  Medication Sig Dispense Refill  . allopurinol (ZYLOPRIM) 300 MG tablet Take 1 tablet (300 mg total) by mouth daily. 90 tablet 3  . amLODipine (NORVASC) 10 MG tablet Take 1 tablet (10 mg total) by mouth daily. 90 tablet 3  . aspirin 81 MG tablet Take 81 mg by mouth daily.      . IRON PO Take 65 mg by mouth.    . potassium citrate (UROCIT-K) 10 MEQ (1080 MG) SR tablet Take 1 tablet (10 mEq total) by mouth 3 (three) times daily with meals. 300 tablet 1  . quinapril (ACCUPRIL) 20 MG tablet Take 1 tablet (20 mg total) by mouth daily. 90 tablet 3  . diphenhydrAMINE (SLEEP AID, DIPHENHYDRAMINE,) 25 MG tablet Take 25 mg by mouth at bedtime as needed for sleep. Reported on 10/26/2015     No current facility-administered medications for this visit.    Objective: BP 146/64 mmHg  Pulse 72  Temp(Src) 98.1 F (36.7 C)  Wt 237 lb (107.502 kg) Gen: NAD, resting comfortably CV: RRR, 4/6 SEM, no rubs or gallops Lungs: CTAB no crackles, wheeze, rhonchi Abdomen: soft/nontender/nondistended/normal bowel sounds. No rebound or guarding.  Ext: trace edema Skin: warm, dry Neuro: grossly normal, moves all extremities  Assessment/Plan:  HYPERCHOLESTEROLEMIA WITH HIGH HDL S:  slightly poorly controlled LDL without statin but excellent HDL and total. No myalgias.  Lab Results  Component Value Date   CHOL 195 11/11/2013   HDL 70.80 11/11/2013   LDLCALC 112* 11/11/2013   LDLDIRECT 136.2 10/25/2012   TRIG 60.0 11/11/2013   CHOLHDL 3 11/11/2013   A/P: we discussed primary prevention benefit unclear at age 36. Discussed perioperative statin as an option- we ultimately decided jointly not to pursue with excellent HDL   Essential hypertension S: poorly controlled on amlodipine 10mg  and quinapril 20mg  BP Readings from Last 3 Encounters:  10/26/15 146/64  10/08/15 152/68  07/06/15 130/62  A/P:Continue current meds:  But increase quinapril to 40mg  and follow up next week. Just had amlodipine increased but prefer <140/90 going into surgery. Check bmet at follow up  1 week follow up for BP. Surgical clearance for partial knee replacement- thinks he is going to have epidural so likely lower risk procedure. Focus is to get blood pressure in better position before surgery then will send letter- left at [facility] desk. Return precautions advised.   Garret Reddish, MD

## 2015-10-28 DIAGNOSIS — M1712 Unilateral primary osteoarthritis, left knee: Secondary | ICD-10-CM | POA: Diagnosis not present

## 2015-10-29 DIAGNOSIS — M1712 Unilateral primary osteoarthritis, left knee: Secondary | ICD-10-CM

## 2015-10-29 NOTE — H&P (Signed)
PREOPERATIVE H&P Patient ID: Barry Decker MRN: [id number] DOB/AGE: September 16, 1932 82 y.o.  Chief Complaint: OSTEOARTHRITIS LEFT KNEE  Planned Procedure Date: 11/16/15  Cardiac Clearance by [name], MD on 10/27/15.   HPI: LUISANTONIO Decker is a 82 y.o. male who presents for evaluation of OSTEOARTHRITIS LEFT KNEE. The patient has a history of pain and functional disability in the left knee due to arthritis and has failed non-surgical conservative treatments for greater than 12 weeks to includeNSAID's and/or analgesics, corticosteriod injections, viscosupplementation injections and activity modification.  Onset of symptoms was gradual, starting 3 years ago with gradually worsening course since that time.  Patient currently rates pain at 6 out of 10 with activity. Patient has worsening of pain with activity and weight bearing, pain that interferes with activities of daily living and pain with passive range of motion. There is no active infection.  Past Medical History  Diagnosis Date  . Colon cancer ([place])     precancer-partial colectomy  . Gout   . Hypertension   . Elevated HDL   . Squamous cell skin cancer   . Aortic valve disease   . NEPHROLITHIASIS, HX OF 01/07/2008   Past Surgical History  Procedure Laterality Date  . Lumbar laminectomy    . Facet joint injection  2009  . Partial colectomy  2005    precancerours  . Other surgical history      ruptured ear drum with skin graft   No Known Allergies Prior to Admission medications   Medication Sig Start Date End Date Taking? Authorizing Provider  allopurinol (ZYLOPRIM) 300 MG tablet Take 1 tablet (300 mg total) by mouth daily. 06/24/15  Yes [name], MD  amLODipine (NORVASC) 10 MG tablet Take 1 tablet (10 mg total) by mouth daily. 10/08/15  Yes [name], MD  aspirin 81 MG tablet Take 81 mg by mouth daily.     Yes Historical Provider, MD  diphenhydrAMINE (SLEEP AID, DIPHENHYDRAMINE,) 25 MG tablet Take 25 mg  by mouth at bedtime as needed for sleep. Reported on 10/26/2015   Yes Historical Provider, MD  IRON PO Take 65 mg by mouth.   Yes Historical Provider, MD  potassium citrate (UROCIT-K) 10 MEQ (1080 MG) SR tablet Take 1 tablet (10 mEq total) by mouth 3 (three) times daily with meals. 07/04/13  Yes Lisabeth Pick, MD  quinapril (ACCUPRIL) 20 MG tablet Take 1 tablet (20 mg total) by mouth daily. Patient taking differently: Take 20 mg by mouth 2 (two) times daily.  06/24/15  Yes [name], MD   Social History   Social History  . Marital Status: Married    Spouse Name: N/A  . Number of Children: 5  . Years of Education: N/A   Occupational History  . Retired-labor Control and instrumentation engineer    Social History Main Topics  . Smoking status: Former Smoker -- 1.00 packs/day for 20 years    Types: Cigarettes    Quit date: 09/15/1980  . Smokeless tobacco: Not on file  . Alcohol Use: 5.0 oz/week    10 drink(s) per week  . Drug Use: No  . Sexual Activity: Not on file   Other Topics Concern  . Not on file   Social History Narrative   Married. 5 children. 4 grandkids. 1 greatgrandkid.       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       Hobbies: woodworking      Advised to consider advanced directives/hcpoa-thinks wife may have  HCPOA from years ago.   Family History  Problem Relation Age of Onset  . Heart attack Mother 20    former smoker  . Heart attack Father 86    former smoker  . Hypertension Sister   . Heart attack Brother 26    Rheumatic fever    ROS: Some shortness of breath with exertion which is chronic and followed by cardiology.  He has decreased hearing/ringing in ears. Currently denies lightheadedness, dizziness, Fever, chills, CP.   No personal history of DVT, PE, MI, or CVA. No loose teeth.  He has a partial dental appliance. All other systems have been reviewed and were otherwise negative with the exception of those mentioned in the HPI and as  above.  Objective: Vitals: Ht: 6'1" Wt: 237 Temp: 97.9 BP: 148/66 Pulse: 67 O2 95% on room air. Physical Exam: General: Alert, NAD.  Antalgic Gait  HEENT: EOMI, Good Neck Extension Pulm: No increased work of breathing.  Clear B/L A/P w/o crackle or wheeze.  CV: RRR, Systolic Murmur. GI: soft, NT, ND Neuro: Neuro grossly intact b/l upper/lower ext.  Sensation intact distally.  He notes some chronic decreased sensation on Left side in L4/L5 distribution lower leg dt back surgery. Skin: No lesions in the area of chief complaint MSK/Surgical Site: Left knee: No effusion, Medial JLT, 5% flexion contracture. ROM 5-110 deg.  5/5 strength.  NVI.  Stable Lachman's, Varus, Valgus stress.  Imaging Review Plain radiographs demonstrate severe degenerative joint disease of the left knee, primarily medial.  Stress view 09/29/15 shows good medial space and preserved lateral space.   Assessment: Principal Problem:   Primary osteoarthritis of left knee Active Problems:   HYPERCHOLESTEROLEMIA WITH HIGH HDL   Gout   Essential hypertension   Aortic stenosis   History of malignant neoplasm of large intestine   Plan: Plan for Procedure(s): LEFT KNEE UNICOMPARTMENTAL ARTHROPLASTY  The patient history, physical examination, clinical judgement of the provider and imaging studies are consistent with end stage degenerative joint disease and joint arthroplasty is deemed medically necessary. The treatment options including medical management, injection therapy, arthroscopy and arthroplasty were discussed at length. The risks and benefits of Procedure(s): LEFT KNEE UNICOMPARTMENTAL ARTHROPLASTY were presented and reviewed.  The risks of nonoperative treatment, versus surgical intervention including but not limited to continued pain, aseptic loosening, stiffness, dislocation/subluxation, infection, bleeding, nerve injury, blood clots, cardiopulmonary complications, morbidity, mortality, among others were discussed.  The patient verbalizes understanding and wishes to proceed with the plan.  Patient is being admitted for inpatient treatment for surgery, pain control, PT, OT, prophylactic antibiotics, VTE prophylaxis, progressive ambulation and ADL's and discharge planning.   Dental prophylaxis discussed and recommended for 2 years postoperatively.  The patient does meet the criteria for TXA which will be used perioperatively via IV.   ASA 325 mg will be used postoperatively for DVT prophylaxis in addition to SCDs, and early ambulation. The patient is planning to be discharged home with home health services in care of his wife.  Charna Elizabeth Martensen III,PA-C 10/29/2015 7:17 AM

## 2015-11-03 ENCOUNTER — Ambulatory Visit (INDEPENDENT_AMBULATORY_CARE_PROVIDER_SITE_OTHER): Payer: PPO | Admitting: Family Medicine

## 2015-11-03 ENCOUNTER — Encounter: Payer: Self-pay | Admitting: Family Medicine

## 2015-11-03 VITALS — BP 130/68 | HR 76 | Temp 98.2°F | Ht 73.0 in | Wt 236.0 lb

## 2015-11-03 DIAGNOSIS — I10 Essential (primary) hypertension: Secondary | ICD-10-CM | POA: Diagnosis not present

## 2015-11-03 LAB — BASIC METABOLIC PANEL
BUN: 15 mg/dL (ref 6–23)
CO2: 29 mEq/L (ref 19–32)
Calcium: 9.2 mg/dL (ref 8.4–10.5)
Chloride: 102 mEq/L (ref 96–112)
Creatinine, Ser: 0.78 mg/dL (ref 0.40–1.50)
GFR: 101.59 mL/min (ref >60.00)
Glucose, Bld: 93 mg/dL (ref 70–99)
Potassium: 4.5 mEq/L (ref 3.5–5.1)
Sodium: 137 mEq/L (ref 135–145)

## 2015-11-03 MED ORDER — QUINAPRIL HCL 40 MG PO TABS: 40.0000 mg | ORAL_TABLET | Freq: Every day | ORAL | Status: DC

## 2015-11-03 NOTE — Assessment & Plan Note (Signed)
S: controlled on quinapril 40mg  and amlodipine 10mg  on repeat. Home readings generally in 140s with some in 130s and sparing into 150s Home cuff checked 130/68 my reading 135/76    Home cuff BP Readings from Last 3 Encounters:  11/03/15 130/68  10/26/15 146/64  10/08/15 152/68  A/P:Continue current meds, Check bmet to check Cr and potassium- if stable will send medical maximization for surgery letter in to [facility]. Increased dose of quinapril to 40mg  with new rx as well.  Home cuff about 5 points high on several checks today.

## 2015-11-03 NOTE — Patient Instructions (Signed)
Blood pressure looks great in office and improved trend at home. Your cuff seems to run about 5 points high compared to our readings so you can take that into account when monitoring at home. So for home cuff would generally want you <145/90 and for the most part you seem to be there.   Check kidney function and potassium before you leave- when this comes back and if normal we can send the letter off for you for presurgical medical maximization.   We have ordered labs or studies at this visit. It can take up to 1-2 weeks for results and processing. IF results require follow up or explanation, we will call you with instructions. Clinically stable results will be released to your [facility]. If you have not heard from [place] or cannot find your results in [facility] in 2 weeks please contact our office at [phone].  If you are not yet signed up for [facility], please consider signing up

## 2015-11-03 NOTE — Progress Notes (Signed)
Subjective:  Barry Decker is a 82 y.o. year old very pleasant male patient who presents for/with See problem oriented charting ROS- No chest pain or shortness of breath. No headache or blurry vision. .see any ROS included in HPI as well.   Past Medical History-  Patient Active Problem List   Diagnosis Date Noted  . Aortic stenosis 01/31/2007    Priority: High  . HYPERCHOLESTEROLEMIA WITH HIGH HDL 01/31/2007    Priority: Medium  . Gout 01/31/2007    Priority: Medium  . Essential hypertension 01/31/2007    Priority: Medium  . History of malignant neoplasm of large intestine 01/31/2007    Priority: Medium  . Osteoarthritis of left knee 05/25/2014    Priority: Low  . Primary osteoarthritis of left knee 10/29/2015    Medications- reviewed and updated Current Outpatient Prescriptions  Medication Sig Dispense Refill  . allopurinol (ZYLOPRIM) 300 MG tablet Take 1 tablet (300 mg total) by mouth daily. 90 tablet 3  . amLODipine (NORVASC) 10 MG tablet Take 1 tablet (10 mg total) by mouth daily. 90 tablet 3  . aspirin 81 MG tablet Take 81 mg by mouth daily.      . diphenhydrAMINE (SLEEP AID, DIPHENHYDRAMINE,) 25 MG tablet Take 25 mg by mouth at bedtime as needed for sleep. Reported on 10/26/2015    . IRON PO Take 65 mg by mouth.    . potassium citrate (UROCIT-K) 10 MEQ (1080 MG) SR tablet Take 1 tablet (10 mEq total) by mouth 3 (three) times daily with meals. 300 tablet 1  . quinapril (ACCUPRIL) 40 MG tablet Take 1 tablet (40 mg total) by mouth daily. 90 tablet 3   No current facility-administered medications for this visit.    Objective: BP 130/68 mmHg  Pulse 76  Temp(Src) 98.2 F (36.8 C) (Oral)  Ht 6\' 1"  (1.854 m)  Wt 236 lb (107.049 kg)  BMI 31.14 kg/m2 Gen: NAD, resting comfortably CV: RRR, 4/6 SEM, no rubs or gallops Lungs: CTAB no crackles, wheeze, rhonchi Abdomen: soft/nontender/nondistended/normal bowel sounds. No rebound or guarding.  Ext: trace edema Skin: warm,  dry Neuro: grossly normal, moves all extremities  Assessment/Plan:  Essential hypertension S: controlled on quinapril 40mg  and amlodipine 10mg  on repeat. Home readings generally in 140s with some in 130s and sparing into 150s Home cuff checked 130/68 my reading 135/76    Home cuff BP Readings from Last 3 Encounters:  11/03/15 130/68  10/26/15 146/64  10/08/15 152/68  A/P:Continue current meds, Check bmet to check Cr and potassium- if stable will send medical maximization for surgery letter in to [facility]. Increased dose of quinapril to 40mg  with new rx as well.  Home cuff about 5 points high on several checks today.   Return precautions advised. Will return if home BP above 145/90 consistently since home cuff about 5 points high  Orders Placed This Encounter  Procedures  . Basic metabolic panel    [place]   Meds ordered this encounter  Medications  . quinapril (ACCUPRIL) 40 MG tablet    Sig: Take 1 tablet (40 mg total) by mouth daily.    Dispense:  90 tablet    Refill:  3   The duration of face-to-face time during this visit was 15 minutes. Greater than 50% of this time was spent in counseling, explanation of diagnosis, planning of further management, and/or coordination of care.   Garret Reddish, MD

## 2015-11-04 NOTE — Pre-Procedure Instructions (Signed)
Barry Decker  11/04/2015     Your procedure is scheduled on : Tuesday Nov 16, 2015 at 10:15 AM.  Report to [facility] Admitting at 8:15 AM.  Call this number if you have problems the morning of surgery: [phone]    Remember:  Do not eat food or drink liquids after midnight.  Take these medicines the morning of surgery with A SIP OF WATER : Allopurinol (Zyloprim), Amlodipine (Norvasc)   Stop taking any vitamins,herbal medications/supplements, NSAIDs, Ibuprofen, Advil, Motrin, Aleve, etc on Tuesday May 23rd   Do not wear jewelry.  Do not wear lotions, powders, or cologne.    Men may shave face and neck.  Do not bring valuables to the hospital.  [facility] is not responsible for any belongings or valuables.  Contacts, dentures or bridgework may not be worn into surgery.  Leave your suitcase in the car.  After surgery it may be brought to your room.  For patients admitted to the hospital, discharge time will be determined by your treatment team.  Patients discharged the day of surgery will not be allowed to drive home.   Name and phone number of your driver:    Special instructions:  Shower using CHG soap the night before and the morning of your surgery  Please read over the following fact sheets that you were given. Pain Booklet, Coughing and Deep Breathing, Blood Transfusion Information, Total Joint Packet, MRSA Information and Surgical Site Infection Prevention

## 2015-11-05 ENCOUNTER — Encounter (HOSPITAL_COMMUNITY)
Admission: RE | Admit: 2015-11-05 | Discharge: 2015-11-05 | Disposition: A | Discharge status: Home / Self Care | Payer: PPO | Source: Ambulatory Visit | Attending: Orthopedic Surgery | Admitting: Orthopedic Surgery

## 2015-11-05 ENCOUNTER — Encounter (HOSPITAL_COMMUNITY): Payer: Self-pay

## 2015-11-05 DIAGNOSIS — Z0183 Encounter for blood typing: Secondary | ICD-10-CM | POA: Insufficient documentation

## 2015-11-05 DIAGNOSIS — Z01818 Encounter for other preprocedural examination: Secondary | ICD-10-CM | POA: Insufficient documentation

## 2015-11-05 DIAGNOSIS — Z01812 Encounter for preprocedural laboratory examination: Secondary | ICD-10-CM | POA: Insufficient documentation

## 2015-11-05 DIAGNOSIS — Z87891 Personal history of nicotine dependence: Secondary | ICD-10-CM | POA: Insufficient documentation

## 2015-11-05 DIAGNOSIS — Z79899 Other long term (current) drug therapy: Secondary | ICD-10-CM | POA: Insufficient documentation

## 2015-11-05 DIAGNOSIS — I10 Essential (primary) hypertension: Secondary | ICD-10-CM | POA: Diagnosis not present

## 2015-11-05 DIAGNOSIS — M1712 Unilateral primary osteoarthritis, left knee: Secondary | ICD-10-CM | POA: Insufficient documentation

## 2015-11-05 DIAGNOSIS — I350 Nonrheumatic aortic (valve) stenosis: Secondary | ICD-10-CM | POA: Insufficient documentation

## 2015-11-05 DIAGNOSIS — Z7982 Long term (current) use of aspirin: Secondary | ICD-10-CM | POA: Diagnosis not present

## 2015-11-05 HISTORY — DX: Personal history of urinary calculi: Z87.442

## 2015-11-05 HISTORY — DX: Unspecified osteoarthritis, unspecified site: M19.90

## 2015-11-05 LAB — CBC WITH DIFFERENTIAL/PLATELET
Basophils Absolute: 0 10*3/uL (ref 0.0–0.1)
Basophils Relative: 1 %
Eosinophils Absolute: 0.1 10*3/uL (ref 0.0–0.7)
Eosinophils Relative: 2 %
HCT: 41.8 % (ref 39.0–52.0)
Hemoglobin: 13.5 g/dL (ref 13.0–17.0)
Lymphocytes Relative: 28 %
Lymphs Abs: 1.5 10*3/uL (ref 0.7–4.0)
MCH: 33.3 pg (ref 26.0–34.0)
MCHC: 32.3 g/dL (ref 30.0–36.0)
MCV: 103.2 fL — ABNORMAL HIGH (ref 78.0–100.0)
Monocytes Absolute: 0.6 10*3/uL (ref 0.1–1.0)
Monocytes Relative: 11 %
Neutro Abs: 3.1 10*3/uL (ref 1.7–7.7)
Neutrophils Relative %: 58 %
Platelets: 262 10*3/uL (ref 150–400)
RBC: 4.05 MIL/uL — ABNORMAL LOW (ref 4.22–5.81)
RDW: 14.2 % (ref 11.5–15.5)
WBC: 5.4 10*3/uL (ref 4.0–10.5)

## 2015-11-05 LAB — URINALYSIS, ROUTINE W REFLEX MICROSCOPIC
Bilirubin Urine: NEGATIVE
Glucose, UA: NEGATIVE mg/dL
Hgb urine dipstick: NEGATIVE
Ketones, ur: NEGATIVE mg/dL
Leukocytes, UA: NEGATIVE
Nitrite: NEGATIVE
Protein, ur: NEGATIVE mg/dL
Specific Gravity, Urine: 1.016 (ref 1.005–1.030)
pH: 6 (ref 5.0–8.0)

## 2015-11-05 LAB — SURGICAL PCR SCREEN
MRSA, PCR: NEGATIVE
Staphylococcus aureus: NEGATIVE

## 2015-11-05 LAB — COMPREHENSIVE METABOLIC PANEL
ALT: 21 U/L (ref 17–63)
AST: 23 U/L (ref 15–41)
Albumin: 3.8 g/dL (ref 3.5–5.0)
Alkaline Phosphatase: 70 U/L (ref 38–126)
Anion gap: 8 (ref 5–15)
BUN: 18 mg/dL (ref 6–20)
CO2: 25 mmol/L (ref 22–32)
Calcium: 9.4 mg/dL (ref 8.9–10.3)
Chloride: 104 mmol/L (ref 101–111)
Creatinine, Ser: 0.91 mg/dL (ref 0.61–1.24)
GFR calc Af Amer: >60 mL/min (ref >60)
GFR calc non Af Amer: >60 mL/min (ref >60)
Glucose, Bld: 111 mg/dL — ABNORMAL HIGH (ref 65–99)
Potassium: 4.4 mmol/L (ref 3.5–5.1)
Sodium: 137 mmol/L (ref 135–145)
Total Bilirubin: 0.7 mg/dL (ref 0.3–1.2)
Total Protein: 6.8 g/dL (ref 6.5–8.1)

## 2015-11-05 LAB — TYPE AND SCREEN
ABO/RH(D): O POS
Antibody Screen: NEGATIVE

## 2015-11-05 LAB — ABO/RH: ABO/RH(D): O POS

## 2015-11-05 LAB — PROTIME-INR
INR: 1.06 (ref 0.00–1.49)
Prothrombin Time: 14 seconds (ref 11.6–15.2)

## 2015-11-05 LAB — APTT: aPTT: 31 seconds (ref 24–37)

## 2015-11-05 NOTE — Progress Notes (Signed)
PCP is Garret Reddish  Cardiologist is Lauree Chandler  Patient arrived to PAT accompanied by his spouse [name]. Patient denied having any acute cardiac or pulmonary issues.  Patient informed Nurse that he had a stress test > 5 years ago, but denied having a cardiac cath, or having sleep apnea.   Will send chart to anesthesia for review

## 2015-11-08 NOTE — Progress Notes (Signed)
Anesthesia Chart Review:  Pt is an 82 year old male scheduled for L unicompartmental knee on 11/16/2015 with Dr. Alain Marion.   Cardiologist is Dr. Lauree Chandler who has cleared pt for surgery. PCP is Dr. Garret Reddish who has cleared pt for surgery.   PMH includes:  HTN, aortic stenosis. Former smoker. BMI 31  Medications include: amlodipine, ASA, iron, potassium, quinapril.   Preoperative labs reviewed.    EKG 10/08/15: sinus rhythm with 1st degree AV block. PVC. LAFB. LVH  Echo 10/26/15:  - Left ventricle: The cavity size was normal. Wall thickness was increased in a pattern of severe LVH. Systolic function was normal. The estimated ejection fraction was in the range of 55% to 60%. Wall motion was normal; there were no regional wall motion abnormalities. Doppler parameters are consistent with abnormal left ventricular relaxation (grade 1 diastolic dysfunction). - Aortic valve: Valve mobility was restricted. There was moderate stenosis. There was mild regurgitation. Valve area (VTI): 1.39cm^2. Valve area (Vmax): 1.51 cm^2. Valve area (Vmean): 1.48cm^2. - Ascending aorta: The ascending aorta was moderately dilated. - Mitral valve: Calcified annulus. - Right ventricle: The cavity size was mildly dilated. - Right atrium: The atrium was mildly dilated. - Impressions: Normal LV systolic function; grade 1 diastolic dysfunction; severe LVH; calcified aortic valve with moderate AS (mean gradient 28 mmHg) and mild AI; moderately dilated ascending aorta (suggest CTA or MRA to further asses); mild RAE and RVE. Per Dr. Angelena Form, echo is overall unchanged from prior 11/23/14.   If no changes, I anticipate pt can proceed with surgery as scheduled.   Willeen Cass, FNP-BC [facility]/Anesthesiology Phone: [phone] 11/08/2015 3:47 PM

## 2015-11-10 ENCOUNTER — Telehealth: Payer: Self-pay | Admitting: Family Medicine

## 2015-11-10 NOTE — Telephone Encounter (Signed)
Malcolm Metro with [facility] would like clarification on rx quinapril (ACCUPRIL) 40 MG tablet  Pt used to be on 20 mg, and they need confirmation of increase. Direct line [phone]

## 2015-11-10 NOTE — Telephone Encounter (Signed)
Left message on voicemail at [facility]. Clarified Accupril dose was increased from 20mg  to 40 mg at 10/26/15 office visit.

## 2015-11-16 ENCOUNTER — Encounter (HOSPITAL_COMMUNITY)
Admission: RE | Disposition: A | Discharge status: Home / Self Care | Payer: Self-pay | Source: Ambulatory Visit | Attending: Orthopedic Surgery

## 2015-11-16 ENCOUNTER — Encounter (HOSPITAL_COMMUNITY): Payer: Self-pay | Admitting: *Deleted

## 2015-11-16 ENCOUNTER — Observation Stay (HOSPITAL_COMMUNITY)
Admission: RE | Admit: 2015-11-16 | Discharge: 2015-11-17 | Disposition: A | Discharge status: Home / Self Care | Payer: PPO | Source: Ambulatory Visit | Attending: Orthopedic Surgery | Admitting: Orthopedic Surgery

## 2015-11-16 ENCOUNTER — Inpatient Hospital Stay (HOSPITAL_COMMUNITY): Payer: PPO | Admitting: Emergency Medicine

## 2015-11-16 ENCOUNTER — Observation Stay (HOSPITAL_COMMUNITY): Payer: PPO

## 2015-11-16 ENCOUNTER — Inpatient Hospital Stay (HOSPITAL_COMMUNITY): Payer: PPO | Admitting: Certified Registered Nurse Anesthetist

## 2015-11-16 DIAGNOSIS — I10 Essential (primary) hypertension: Secondary | ICD-10-CM | POA: Diagnosis not present

## 2015-11-16 DIAGNOSIS — Z85038 Personal history of other malignant neoplasm of large intestine: Secondary | ICD-10-CM | POA: Insufficient documentation

## 2015-11-16 DIAGNOSIS — Z9049 Acquired absence of other specified parts of digestive tract: Secondary | ICD-10-CM | POA: Insufficient documentation

## 2015-11-16 DIAGNOSIS — Z9889 Other specified postprocedural states: Secondary | ICD-10-CM

## 2015-11-16 DIAGNOSIS — M1712 Unilateral primary osteoarthritis, left knee: Principal | ICD-10-CM | POA: Insufficient documentation

## 2015-11-16 DIAGNOSIS — Z79899 Other long term (current) drug therapy: Secondary | ICD-10-CM | POA: Diagnosis not present

## 2015-11-16 DIAGNOSIS — I350 Nonrheumatic aortic (valve) stenosis: Secondary | ICD-10-CM

## 2015-11-16 DIAGNOSIS — M109 Gout, unspecified: Secondary | ICD-10-CM | POA: Insufficient documentation

## 2015-11-16 DIAGNOSIS — M179 Osteoarthritis of knee, unspecified: Secondary | ICD-10-CM | POA: Diagnosis not present

## 2015-11-16 DIAGNOSIS — G8918 Other acute postprocedural pain: Secondary | ICD-10-CM | POA: Diagnosis not present

## 2015-11-16 DIAGNOSIS — Z87891 Personal history of nicotine dependence: Secondary | ICD-10-CM | POA: Insufficient documentation

## 2015-11-16 DIAGNOSIS — E7800 Pure hypercholesterolemia, unspecified: Secondary | ICD-10-CM | POA: Diagnosis present

## 2015-11-16 DIAGNOSIS — Z7982 Long term (current) use of aspirin: Secondary | ICD-10-CM | POA: Diagnosis not present

## 2015-11-16 HISTORY — DX: Cardiac murmur, unspecified: R01.1

## 2015-11-16 HISTORY — PX: REPLACEMENT UNICONDYLAR JOINT KNEE: SUR1227

## 2015-11-16 HISTORY — PX: PARTIAL KNEE ARTHROPLASTY: SHX2174

## 2015-11-16 SURGERY — ARTHROPLASTY, KNEE, UNICOMPARTMENTAL
Anesthesia: Monitor Anesthesia Care | Site: Knee | Laterality: Left

## 2015-11-16 MED ORDER — MIDAZOLAM HCL 2 MG/2ML IJ SOLN: 2.0000 mg | Freq: Once | INTRAMUSCULAR | Status: DC

## 2015-11-16 MED ORDER — PROPOFOL 500 MG/50ML IV EMUL
INTRAVENOUS | Status: DC | PRN
  Administered 2015-11-16: 75 ug/kg/min via INTRAVENOUS

## 2015-11-16 MED ORDER — CEFAZOLIN SODIUM-DEXTROSE 2-4 GM/100ML-% IV SOLN
2.0000 g | INTRAVENOUS | Status: AC
  Administered 2015-11-16: 2 g via INTRAVENOUS
  Filled 2015-11-16: qty 100

## 2015-11-16 MED ORDER — ALLOPURINOL 300 MG PO TABS
300.0000 mg | ORAL_TABLET | Freq: Every day | ORAL | Status: DC
  Administered 2015-11-17: 300 mg via ORAL
  Filled 2015-11-16: qty 1

## 2015-11-16 MED ORDER — ONDANSETRON HCL 4 MG PO TABS: 4.0000 mg | ORAL_TABLET | Freq: Four times a day (QID) | ORAL | Status: DC | PRN

## 2015-11-16 MED ORDER — PHENOL 1.4 % MT LIQD: 1.0000 | OROMUCOSAL | Status: DC | PRN

## 2015-11-16 MED ORDER — ACETAMINOPHEN 325 MG PO TABS: 650.0000 mg | ORAL_TABLET | Freq: Four times a day (QID) | ORAL | Status: DC | PRN

## 2015-11-16 MED ORDER — DEXTROSE-NACL 5-0.45 % IV SOLN: 100.0000 mL/h | INTRAVENOUS | Status: DC

## 2015-11-16 MED ORDER — ACETAMINOPHEN 650 MG RE SUPP: 650.0000 mg | Freq: Four times a day (QID) | RECTAL | Status: DC | PRN

## 2015-11-16 MED ORDER — METHOCARBAMOL 500 MG PO TABS
500.0000 mg | ORAL_TABLET | Freq: Four times a day (QID) | ORAL | Status: DC | PRN
  Administered 2015-11-16: 500 mg via ORAL
  Filled 2015-11-16: qty 1

## 2015-11-16 MED ORDER — ASPIRIN EC 325 MG PO TBEC
325.0000 mg | DELAYED_RELEASE_TABLET | Freq: Every day | ORAL | Status: DC
  Administered 2015-11-17: 325 mg via ORAL
  Filled 2015-11-16: qty 1

## 2015-11-16 MED ORDER — DEXAMETHASONE SODIUM PHOSPHATE 10 MG/ML IJ SOLN
10.0000 mg | Freq: Once | INTRAMUSCULAR | Status: DC
  Filled 2015-11-16: qty 1

## 2015-11-16 MED ORDER — MENTHOL 3 MG MT LOZG: 1.0000 | LOZENGE | OROMUCOSAL | Status: DC | PRN

## 2015-11-16 MED ORDER — LACTATED RINGERS IV SOLN
INTRAVENOUS | Status: DC
Start: 2015-11-16 — End: 2015-11-17
  Administered 2015-11-16 (×2): via INTRAVENOUS

## 2015-11-16 MED ORDER — SODIUM CHLORIDE 0.9 % IR SOLN
Status: DC | PRN
  Administered 2015-11-16: 1000 mL

## 2015-11-16 MED ORDER — AMLODIPINE BESYLATE 10 MG PO TABS
20.0000 mg | ORAL_TABLET | Freq: Every day | ORAL | Status: DC
  Administered 2015-11-17: 20 mg via ORAL
  Filled 2015-11-16: qty 2

## 2015-11-16 MED ORDER — ACETAMINOPHEN 500 MG PO TABS
1000.0000 mg | ORAL_TABLET | Freq: Once | ORAL | Status: AC
  Administered 2015-11-16: 1000 mg via ORAL
  Filled 2015-11-16: qty 2

## 2015-11-16 MED ORDER — FENTANYL CITRATE (PF) 100 MCG/2ML IJ SOLN
INTRAMUSCULAR | Status: AC
  Administered 2015-11-16: 100 ug
  Filled 2015-11-16: qty 2

## 2015-11-16 MED ORDER — DEXTROSE-NACL 5-0.45 % IV SOLN
INTRAVENOUS | Status: AC
  Administered 2015-11-16: 16:00:00 via INTRAVENOUS

## 2015-11-16 MED ORDER — DEXTROSE 5 % IV SOLN
10.0000 mg | INTRAVENOUS | Status: DC | PRN
  Administered 2015-11-16: 20 ug/min via INTRAVENOUS

## 2015-11-16 MED ORDER — CEFAZOLIN SODIUM-DEXTROSE 2-4 GM/100ML-% IV SOLN
2.0000 g | Freq: Four times a day (QID) | INTRAVENOUS | Status: AC
  Administered 2015-11-16 (×2): 2 g via INTRAVENOUS
  Filled 2015-11-16 (×4): qty 100

## 2015-11-16 MED ORDER — BUPIVACAINE-EPINEPHRINE (PF) 0.5% -1:200000 IJ SOLN
INTRAMUSCULAR | Status: DC | PRN
  Administered 2015-11-16: 30 mL via PERINEURAL

## 2015-11-16 MED ORDER — FENTANYL CITRATE (PF) 100 MCG/2ML IJ SOLN
INTRAMUSCULAR | Status: DC | PRN
  Administered 2015-11-16: 50 ug via INTRAVENOUS

## 2015-11-16 MED ORDER — MORPHINE SULFATE (PF) 2 MG/ML IV SOLN: 2.0000 mg | INTRAVENOUS | Status: DC | PRN

## 2015-11-16 MED ORDER — TRANEXAMIC ACID 1000 MG/10ML IV SOLN
1000.0000 mg | INTRAVENOUS | Status: AC
  Administered 2015-11-16: 1000 mg via INTRAVENOUS
  Filled 2015-11-16: qty 10

## 2015-11-16 MED ORDER — ONDANSETRON HCL 4 MG/2ML IJ SOLN
4.0000 mg | Freq: Four times a day (QID) | INTRAMUSCULAR | Status: DC | PRN
  Administered 2015-11-17: 4 mg via INTRAVENOUS
  Filled 2015-11-16: qty 2

## 2015-11-16 MED ORDER — MIDAZOLAM HCL 2 MG/2ML IJ SOLN
INTRAMUSCULAR | Status: AC
  Administered 2015-11-16: 2 mg
  Filled 2015-11-16: qty 2

## 2015-11-16 MED ORDER — CHLORHEXIDINE GLUCONATE 4 % EX LIQD: 60.0000 mL | Freq: Once | CUTANEOUS | Status: DC

## 2015-11-16 MED ORDER — 0.9 % SODIUM CHLORIDE (POUR BTL) OPTIME
TOPICAL | Status: DC | PRN
  Administered 2015-11-16: 1000 mL

## 2015-11-16 MED ORDER — HYDROMORPHONE HCL 1 MG/ML IJ SOLN
INTRAMUSCULAR | Status: AC
  Filled 2015-11-16: qty 1

## 2015-11-16 MED ORDER — METOCLOPRAMIDE HCL 5 MG/ML IJ SOLN: 5.0000 mg | Freq: Three times a day (TID) | INTRAMUSCULAR | Status: DC | PRN

## 2015-11-16 MED ORDER — LISINOPRIL 40 MG PO TABS
40.0000 mg | ORAL_TABLET | Freq: Every day | ORAL | Status: DC
Start: 2015-11-16 — End: 2015-11-17
  Administered 2015-11-16 – 2015-11-17 (×2): 40 mg via ORAL
  Filled 2015-11-16 (×2): qty 1

## 2015-11-16 MED ORDER — HYDROMORPHONE HCL 1 MG/ML IJ SOLN
0.2500 mg | INTRAMUSCULAR | Status: DC | PRN
  Administered 2015-11-16: 0.5 mg via INTRAVENOUS

## 2015-11-16 MED ORDER — HYDROCODONE-ACETAMINOPHEN 5-325 MG PO TABS
1.0000 | ORAL_TABLET | ORAL | Status: DC | PRN
  Administered 2015-11-16 – 2015-11-17 (×5): 2 via ORAL
  Filled 2015-11-16 (×5): qty 2

## 2015-11-16 MED ORDER — METHOCARBAMOL 1000 MG/10ML IJ SOLN: 500.0000 mg | Freq: Four times a day (QID) | INTRAVENOUS | Status: DC | PRN

## 2015-11-16 MED ORDER — PROCHLORPERAZINE EDISYLATE 5 MG/ML IJ SOLN: 10.0000 mg | Freq: Four times a day (QID) | INTRAMUSCULAR | Status: DC | PRN

## 2015-11-16 MED ORDER — BUPIVACAINE IN DEXTROSE 0.75-8.25 % IT SOLN
INTRATHECAL | Status: DC | PRN
  Administered 2015-11-16: 15 mg via INTRATHECAL

## 2015-11-16 MED ORDER — FENTANYL CITRATE (PF) 100 MCG/2ML IJ SOLN: 100.0000 ug | Freq: Once | INTRAMUSCULAR | Status: DC

## 2015-11-16 MED ORDER — FENTANYL CITRATE (PF) 250 MCG/5ML IJ SOLN
INTRAMUSCULAR | Status: AC
  Filled 2015-11-16: qty 5

## 2015-11-16 MED ORDER — METOCLOPRAMIDE HCL 5 MG PO TABS: 5.0000 mg | ORAL_TABLET | Freq: Three times a day (TID) | ORAL | Status: DC | PRN

## 2015-11-16 MED ORDER — CELECOXIB 200 MG PO CAPS
200.0000 mg | ORAL_CAPSULE | Freq: Two times a day (BID) | ORAL | Status: DC
  Administered 2015-11-16 – 2015-11-17 (×2): 200 mg via ORAL
  Filled 2015-11-16 (×2): qty 1

## 2015-11-16 MED ORDER — EPHEDRINE SULFATE 50 MG/ML IJ SOLN
INTRAMUSCULAR | Status: DC | PRN
  Administered 2015-11-16: 5 mg via INTRAVENOUS

## 2015-11-16 MED ORDER — PROPOFOL 10 MG/ML IV BOLUS
INTRAVENOUS | Status: AC
  Filled 2015-11-16: qty 20

## 2015-11-16 SURGICAL SUPPLY — 59 items
BANDAGE ESMARK 6X9 LF (GAUZE/BANDAGES/DRESSINGS) ×1 IMPLANT
BNDG CMPR 9X6 STRL LF SNTH (GAUZE/BANDAGES/DRESSINGS) ×1
BNDG CMPR MED 10X6 ELC LF (GAUZE/BANDAGES/DRESSINGS) ×1
BNDG ELASTIC 6X10 VLCR STRL LF (GAUZE/BANDAGES/DRESSINGS) ×1 IMPLANT
BNDG ESMARK 6X9 LF (GAUZE/BANDAGES/DRESSINGS) ×2
BONE CEMENT PALACOSE (Orthopedic Implant) ×2 IMPLANT
BOWL SMART MIX CTS (DISPOSABLE) ×2 IMPLANT
CAPT KNEE PARTIAL 2 ×1 IMPLANT
CEMENT BONE PALACOSE (Orthopedic Implant) ×1 IMPLANT
CHLORAPREP W/TINT 26ML (MISCELLANEOUS) ×2 IMPLANT
CLSR STERI-STRIP ANTIMIC 1/2X4 (GAUZE/BANDAGES/DRESSINGS) ×2 IMPLANT
COVER SURGICAL LIGHT HANDLE (MISCELLANEOUS) ×2 IMPLANT
CUFF TOURNIQUET SINGLE 34IN LL (TOURNIQUET CUFF) ×2 IMPLANT
DRAPE EXTREMITY T 121X128X90 (DRAPE) ×2 IMPLANT
DRAPE IMP U-DRAPE 54X76 (DRAPES) ×4 IMPLANT
DRAPE PROXIMA HALF (DRAPES) ×2 IMPLANT
DRAPE U-SHAPE 47X51 STRL (DRAPES) ×2 IMPLANT
DRSG MEPILEX BORDER 4X4 (GAUZE/BANDAGES/DRESSINGS) IMPLANT
DRSG MEPILEX BORDER 4X8 (GAUZE/BANDAGES/DRESSINGS) ×2 IMPLANT
ELECT CAUTERY BLADE 6.4 (BLADE) ×2 IMPLANT
ELECT REM PT RETURN 9FT ADLT (ELECTROSURGICAL) ×2
ELECTRODE REM PT RTRN 9FT ADLT (ELECTROSURGICAL) ×1 IMPLANT
EVACUATOR 1/8 PVC DRAIN (DRAIN) IMPLANT
FACESHIELD WRAPAROUND (MASK) ×4 IMPLANT
FACESHIELD WRAPAROUND OR TEAM (MASK) ×2 IMPLANT
GLOVE BIO SURGEON STRL SZ7 (GLOVE) ×2 IMPLANT
GLOVE BIO SURGEON STRL SZ7.5 (GLOVE) ×2 IMPLANT
GLOVE BIOGEL PI IND STRL 7.0 (GLOVE) ×1 IMPLANT
GLOVE BIOGEL PI IND STRL 8 (GLOVE) ×1 IMPLANT
GLOVE BIOGEL PI INDICATOR 7.0 (GLOVE) ×1
GLOVE BIOGEL PI INDICATOR 8 (GLOVE) ×1
GOWN STRL REUS W/ TWL LRG LVL3 (GOWN DISPOSABLE) ×1 IMPLANT
GOWN STRL REUS W/ TWL XL LVL3 (GOWN DISPOSABLE) ×1 IMPLANT
GOWN STRL REUS W/TWL LRG LVL3 (GOWN DISPOSABLE) ×2
GOWN STRL REUS W/TWL XL LVL3 (GOWN DISPOSABLE) ×2
HANDPIECE INTERPULSE COAX TIP (DISPOSABLE) ×2
IMMOBILIZER KNEE 22 (SOFTGOODS) ×1 IMPLANT
IMMOBILIZER KNEE 22 UNIV (SOFTGOODS) ×2 IMPLANT
IMMOBILIZER KNEE 24 THIGH 36 (MISCELLANEOUS) IMPLANT
IMMOBILIZER KNEE 24 UNIV (MISCELLANEOUS) ×2
KIT BASIN OR (CUSTOM PROCEDURE TRAY) ×2 IMPLANT
KIT ROOM TURNOVER OR (KITS) ×2 IMPLANT
MANIFOLD NEPTUNE II (INSTRUMENTS) ×2 IMPLANT
NS IRRIG 1000ML POUR BTL (IV SOLUTION) ×2 IMPLANT
PACK BLADE SAW RECIP 70 3 PT (BLADE) ×1 IMPLANT
PACK TOTAL JOINT (CUSTOM PROCEDURE TRAY) ×2 IMPLANT
PACK UNIVERSAL I (CUSTOM PROCEDURE TRAY) ×2 IMPLANT
PAD ARMBOARD 7.5X6 YLW CONV (MISCELLANEOUS) ×2 IMPLANT
SET HNDPC FAN SPRY TIP SCT (DISPOSABLE) ×1 IMPLANT
STAPLER VISISTAT 35W (STAPLE) IMPLANT
SUCTION FRAZIER HANDLE 10FR (MISCELLANEOUS) ×1
SUCTION TUBE FRAZIER 10FR DISP (MISCELLANEOUS) ×1 IMPLANT
SUT MNCRL AB 4-0 PS2 18 (SUTURE) ×1 IMPLANT
SUT MON AB 2-0 CT1 27 (SUTURE) ×4 IMPLANT
SUT MON AB 2-0 CT1 36 (SUTURE) ×1 IMPLANT
SUT VIC AB 1 CTX 36 (SUTURE) ×4
SUT VIC AB 1 CTX36XBRD ANBCTR (SUTURE) ×2 IMPLANT
TOWEL OR 17X24 6PK STRL BLUE (TOWEL DISPOSABLE) ×2 IMPLANT
TOWEL OR 17X26 10 PK STRL BLUE (TOWEL DISPOSABLE) ×2 IMPLANT

## 2015-11-16 NOTE — Progress Notes (Signed)
11/16/15  1620  Pt did well and was able to tolerate the clear liquid diet. Pt ate Jello, icee, and drank a sprite and juice. Pt wants to advance to regular diet for dinner.

## 2015-11-16 NOTE — Transfer of Care (Signed)
Immediate Anesthesia Transfer of Care Note  Patient: Barry Decker  Procedure(s) Performed: Procedure(s): LEFT KNEE UNICOMPARTMENTAL  (Left)  Patient Location: PACU  Anesthesia Type:Spinal  Level of Consciousness: awake, alert , oriented, patient cooperative and responds to stimulation  Airway & Oxygen Therapy: Patient Spontanous Breathing and Patient connected to nasal cannula oxygen  Post-op Assessment: Report given to RN, Post -op Vital signs reviewed and stable and Patient moving all extremities X 4  Post vital signs: Reviewed and stable  Last Vitals:  Filed Vitals:   11/16/15 0847 11/16/15 0859  BP: 185/60 166/68  Pulse: 73   Temp: 36.7 C   Resp: 18     Last Pain: There were no vitals filed for this visit.    Patients Stated Pain Goal: 3 ([id number])  Complications: No apparent anesthesia complications

## 2015-11-16 NOTE — Op Note (Signed)
11/16/2015  11:19 AM  PATIENT:  Barry Decker    PRE-OPERATIVE DIAGNOSIS:  OSTEOARTHRITIS LEFT KNEE  POST-OPERATIVE DIAGNOSIS:  Same  PROCEDURE:  LEFT KNEE UNICOMPARTMENTAL   SURGEON:  Santo Zahradnik D, MD  PHYSICIAN ASSISTANT: none  ANESTHESIA:   General  PREOPERATIVE INDICATIONS:  Barry Decker is a  82 y.o. male with a diagnosis of OSTEOARTHRITIS LEFT KNEE who failed conservative measures and elected for surgical management.    The risks benefits and alternatives were discussed with the patient preoperatively including but not limited to the risks of infection, bleeding, nerve injury, cardiopulmonary complications, blood clots, the need for revision surgery, among others, and the patient was willing to proceed.  OPERATIVE IMPLANTS: Biomet Oxford mobile bearing medial compartment arthroplasty. Femoral Component: medium. Tibial tray: F, Size 3 poly.   OPERATIVE FINDINGS: Endstage grade 4 medial compartment osteoarthritis. No significant changes in the lateral or patellofemoral joint  OPERATIVE PROCEDURE: The patient was brought to the operating room placed in supine position. General anesthesia was administered. IV antibiotics were given. The lower extremity was placed in the legholder and prepped and draped in usual sterile fashion.  Time out was performed.  The leg was elevated and exsanguinated and the tourniquet was inflated. Anteromedial incision was performed, and I took care to preserve the MCL. Parapatellar incision was carried out, and the osteophytes were excised, along with the medial meniscus and a small portion of the fat pad.  The extra medullary tibial cutting jig was applied, using the spoon and the 61mm G-Clamp, and I took care to protect the anterior cruciate ligament insertion and the tibial spine. The medial collateral ligament was also protected, and I resected my proximal tibia, matching the anatomic slope.   The proximal tibial bony cut was removed in one  piece, and I turned my attention to the femur.  The intramedullary femoral rod was placed using the drill, and then using the appropriate reference, I assembled the femoral jig, setting my posterior cutting block. I resected my posterior femur, and then measured my gap.   I then used the mill to match the extension gap to the flexion gap. The gaps were then measured again with the appropriate feeler gauges. Once I had balanced flexion and extension gaps, I then completed the preparation of the femur.  I milled off the anterior aspect of the distal femur to prevent impingement. I also exposed the tibia, and selected the above-named component, and then used the cutting jig to prepare the keel slot on the tibia. I also used the awl to curette out the bone to complete the preparation of the keel. The back wall was intact.  I then placed trial components, and it was found to have excellent motion, and appropriate balance.  I then cemented the components into place, cementing the tibia first, removing all excess cement, and then cementing the femur.  All loose cement was removed.  The real polyethylene insert was applied manually, and the knee was taken through functional range of motion, and found to have excellent stability and restoration of joint motion, with excellent balance.  The wounds were irrigated copiously, and the parapatellar tissue closed with Vicryl, followed by Vicryl for the subcutaneous tissue, with routine closure with Steri-Strips and sterile gauze.  The tourniquet was released, and the patient was awakened and extubated and returned to PACU in stable and satisfactory condition. There were no complications.  POSTOPERATIVE PLAN: DVT px will consist of SCD's and ASA 325, WBAT  [name], MD  This note was generated using a template and dragon dictation system. In light of that, I have reviewed the note and all aspects of it are applicable to this case. Any dictation  errors are due to the computerized dictation system.

## 2015-11-16 NOTE — Anesthesia Preprocedure Evaluation (Signed)
Anesthesia Evaluation  Patient identified by MRN, date of birth, ID band Patient awake    Reviewed: Allergy & Precautions, NPO status , Patient's Chart, lab work & pertinent test results  History of Anesthesia Complications Negative for: history of anesthetic complications  Airway Mallampati: II  TM Distance: >3 FB Neck ROM: Full    Dental  (+) Teeth Intact, Dental Advisory Given   Pulmonary former smoker,    Pulmonary exam normal        Cardiovascular hypertension, Normal cardiovascular exam+ Valvular Problems/Murmurs AS   Impressions:  - Normal LV systolic function; grade 1 diastolic dysfunction; severe LVH; calcified aortic valve with moderate AS (mean gradient 28 mmHg) and mild AI; moderately dilated ascending aorta (suggest CTA or MRA to further asses); mild RAE and RVE.     Neuro/Psych negative neurological ROS  negative psych ROS   GI/Hepatic negative GI ROS, Neg liver ROS,   Endo/Other  negative endocrine ROS  Renal/GU negative Renal ROS     Musculoskeletal   Abdominal   Peds  Hematology   Anesthesia Other Findings   Reproductive/Obstetrics                             Anesthesia Physical Anesthesia Plan  ASA: III  Anesthesia Plan: MAC and Spinal   Post-op Pain Management:    Induction:   Airway Management Planned:   Additional Equipment:   Intra-op Plan:   Post-operative Plan:   Informed Consent: I have reviewed the patients History and Physical, chart, labs and discussed the procedure including the risks, benefits and alternatives for the proposed anesthesia with the patient or authorized representative who has indicated his/her understanding and acceptance.   Dental advisory given  Plan Discussed with: CRNA and Anesthesiologist  Anesthesia Plan Comments:         Anesthesia Quick Evaluation

## 2015-11-16 NOTE — Evaluation (Signed)
Physical Therapy Evaluation Patient Details Name: Barry Decker MRN: [id number] DOB: [birth year] Today's Date: 11/16/2015   History of Present Illness  Pt admitted for elective L unicompartment knee.  Clinical Impression  Patient is s/p above surgery resulting in the deficits listed below (see PT Problem List). Pt tolerated OOB well for first time. Patient will benefit from skilled PT to increase their independence and safety with mobility (while adhering to their precautions) to allow discharge to the venue listed below.     Follow Up Recommendations Home health PT;Supervision/Assistance - 24 hour    Equipment Recommendations  3in1 (PT)    Recommendations for Other Services       Precautions / Restrictions Precautions Precautions: Knee Precaution Booklet Issued: Yes (comment) Precaution Comments: instructed on no pillow under knee Required Braces or Orthoses: Knee Immobilizer - Left Knee Immobilizer - Left: Discontinue once straight leg raise with < 10 degree lag Restrictions Weight Bearing Restrictions: Yes LLE Weight Bearing: Weight bearing as tolerated      Mobility  Bed Mobility Overal bed mobility: Needs Assistance Bed Mobility: Supine to Sit     Supine to sit: Supervision     General bed mobility comments: pt able to manage LEs and utilize long sit technique without difficulty  Transfers Overall transfer level: Needs assistance Equipment used: Rolling walker (2 wheeled) Transfers: Sit to/from [facility] Sit to Stand: Min assist Stand pivot transfers: Min assist       General transfer comment: v/c's for hand placement and sequencing, L KI on, no knee buckling  Ambulation/Gait             General Gait Details: pt took 4 steps to Engineer, production    Modified Rankin (Stroke Patients Only)       Balance                                             Pertinent Vitals/Pain  Pain Assessment: 0-10 Pain Score: 5  Pain Location: L knee Pain Descriptors / Indicators: Sore Pain Intervention(s): Monitored during session    Home Living Family/patient expects to be discharged to:: Private residence Living Arrangements: Spouse/significant other Available Help at Discharge: Family;Available PRN/intermittently Type of Home: House Home Access: Stairs to enter Entrance Stairs-Rails: Left (wall on Right) Entrance Stairs-Number of Steps: 5 Home Layout: One level;Able to live on main level with bedroom/bathroom;Two level Home Equipment: Walker - 2 wheels;Shower seat      Prior Function Level of Independence: Independent               Hand Dominance   Dominant Hand: Right    Extremity/Trunk Assessment   Upper Extremity Assessment: Overall WFL for tasks assessed           Lower Extremity Assessment: LLE deficits/detail   LLE Deficits / Details: pt able to complete L quad set and actively flex L knee to 45 deg  Cervical / Trunk Assessment: Normal  Communication   Communication: No difficulties  Cognition Arousal/Alertness: Awake/alert Behavior During Therapy: WFL for tasks assessed/performed Overall Cognitive Status: Within Functional Limits for tasks assessed                      General Comments General comments (skin integrity, edema, etc.): educated on proper alignment in  CPM and importance of bone foam    Exercises Total Joint Exercises Ankle Circles/Pumps: AROM;Both;10 reps Quad Sets: AROM;Left;10 reps;Supine Heel Slides: AAROM;Left;10 reps;Supine      Assessment/Plan    PT Assessment Patient needs continued PT services  PT Diagnosis Difficulty walking   PT Problem List Decreased strength;Decreased range of motion;Decreased activity tolerance;Decreased balance;Decreased mobility;Decreased knowledge of use of DME  PT Treatment Interventions DME instruction;Functional mobility training;Stair training;Gait training;Therapeutic  activities;Therapeutic exercise;Neuromuscular re-education   PT Goals (Current goals can be found in the Care Plan section) Acute Rehab PT Goals Patient Stated Goal: to get strong again PT Goal Formulation: With patient Time For Goal Achievement: 11/23/15 Potential to Achieve Goals: Good    Frequency 7X/week   Barriers to discharge        Co-evaluation               End of Session Equipment Utilized During Treatment: Gait belt Activity Tolerance: Patient tolerated treatment well Patient left: in chair;with call bell/phone within reach;with family/visitor present (pt demo'd how to call RN staff to return to bed) Nurse Communication: Mobility status    Functional Assessment Tool Used: clinical judgement Functional Limitation: Mobility: Walking and moving around Mobility: Walking and Moving Around Current Status [id number]): At least 40 percent but less than 60 percent impaired, limited or restricted Mobility: Walking and Moving Around Goal Status [phone]): At least 1 percent but less than 20 percent impaired, limited or restricted    Time: 1520-1546 PT Time Calculation (min) (ACUTE ONLY): 26 min   Charges:   PT Evaluation $PT Eval Moderate Complexity: 1 Procedure PT Treatments $Therapeutic Exercise: 8-22 mins   PT G Codes:   PT G-Codes **NOT FOR INPATIENT CLASS** Functional Assessment Tool Used: clinical judgement Functional Limitation: Mobility: Walking and moving around Mobility: Walking and Moving Around Current Status [id number]): At least 40 percent but less than 60 percent impaired, limited or restricted Mobility: Walking and Moving Around Goal Status [phone]): At least 1 percent but less than 20 percent impaired, limited or restricted    Kingsley Callander 11/16/2015, 3:56 PM   Kittie Plater, PT, DPT Pager #: [phone] Office #: [phone]

## 2015-11-16 NOTE — Progress Notes (Signed)
Lunch relief by S. Gregson RN 

## 2015-11-16 NOTE — Interval H&P Note (Signed)
History and Physical Interval Note:  11/16/2015 9:18 AM  Barry Decker  has presented today for surgery, with the diagnosis of OSTEOARTHRITIS LEFT KNEE  The various methods of treatment have been discussed with the patient and family. After consideration of risks, benefits and other options for treatment, the patient has consented to  Procedure(s): LEFT KNEE UNICOMPARTMENTAL  (Left) as a surgical intervention .  The patient's history has been reviewed, patient examined, no change in status, stable for surgery.  I have reviewed the patient's chart and labs.  Questions were answered to the patient's satisfaction.     Lanina Larranaga D

## 2015-11-16 NOTE — Anesthesia Postprocedure Evaluation (Signed)
Anesthesia Post Note  Patient: Barry Decker  Procedure(s) Performed: Procedure(s) (LRB): LEFT KNEE UNICOMPARTMENTAL  (Left)  Patient location during evaluation: PACU Anesthesia Type: Spinal and MAC Level of consciousness: awake and alert Pain management: pain level controlled Vital Signs Assessment: post-procedure vital signs reviewed and stable Respiratory status: spontaneous breathing and respiratory function stable Cardiovascular status: blood pressure returned to baseline and stable Postop Assessment: spinal receding Anesthetic complications: no    Last Vitals:  Filed Vitals:   11/16/15 1245 11/16/15 1315  BP:  138/78  Pulse: 59 61  Temp:  36.5 C  Resp: 14 15    Last Pain:  Filed Vitals:   11/16/15 1322  PainSc: 4                  Marguetta Windish DANIEL

## 2015-11-16 NOTE — Anesthesia Procedure Notes (Addendum)
Anesthesia Regional Block:  Adductor canal block  Pre-Anesthetic Checklist: ,, timeout performed, Correct Patient, Correct Site, Correct Laterality, Correct Procedure, Correct Position, site marked, Risks and benefits discussed,  Surgical consent,  Pre-op evaluation,  At surgeon's request and post-op pain management  Laterality: Left  Prep: chloraprep       Needles:  Injection technique: Single-shot  Needle Type: Stimulator Needle - 80     Needle Length: 10cm 10 cm Needle Gauge: 21 and 21 G    Additional Needles:  Procedures: ultrasound guided (picture in chart) Adductor canal block Narrative:  Start time: 11/16/2015 9:27 AM End time: 11/16/2015 9:37 AM Injection made incrementally with aspirations every 5 mL.  Performed by: Personally    Spinal Patient location during procedure: OR Start time: 11/16/2015 9:53 AM End time: 11/16/2015 10:03 AM Staffing Anesthesiologist: Duane Boston Performed by: anesthesiologist  Preanesthetic Checklist Completed: patient identified, surgical consent, pre-op evaluation, timeout performed, IV checked, risks and benefits discussed and monitors and equipment checked Spinal Block Patient position: sitting Prep: DuraPrep Patient monitoring: cardiac monitor, continuous pulse ox and blood pressure Approach: midline Location: L2-3 Injection technique: single-shot Needle Needle type: Pencan  Needle gauge: 24 G Needle length: 9 cm Additional Notes Functioning IV was confirmed and monitors were applied. Sterile prep and drape, including hand hygiene and sterile gloves were used. The patient was positioned and the spine was prepped. The skin was anesthetized with lidocaine.  Free flow of clear CSF was obtained prior to injecting local anesthetic into the CSF.  The spinal needle aspirated freely following injection.  The needle was carefully withdrawn.  The patient tolerated the procedure well.

## 2015-11-17 DIAGNOSIS — M1712 Unilateral primary osteoarthritis, left knee: Secondary | ICD-10-CM | POA: Diagnosis not present

## 2015-11-17 MED ORDER — DOCUSATE SODIUM 100 MG PO CAPS: 100.0000 mg | ORAL_CAPSULE | Freq: Two times a day (BID) | ORAL | Status: DC

## 2015-11-17 MED ORDER — ASPIRIN EC 325 MG PO TBEC: 325.0000 mg | DELAYED_RELEASE_TABLET | Freq: Every day | ORAL | Status: DC

## 2015-11-17 MED ORDER — ONDANSETRON HCL 4 MG PO TABS: 4.0000 mg | ORAL_TABLET | Freq: Three times a day (TID) | ORAL | Status: DC | PRN

## 2015-11-17 MED ORDER — HYDROCODONE-ACETAMINOPHEN 5-325 MG PO TABS: 1.0000 | ORAL_TABLET | ORAL | Status: DC | PRN

## 2015-11-17 NOTE — Discharge Summary (Signed)
Physician Discharge Summary  Patient ID: Barry Decker MRN: [id number] DOB/AGE: 1933-01-28 82 y.o.  Admit date: 11/16/2015 Discharge date: 11/17/2015  Admission Diagnoses:  Primary osteoarthritis of left knee  Discharge Diagnoses:  Principal Problem:   Primary osteoarthritis of left knee Active Problems:   HYPERCHOLESTEROLEMIA WITH HIGH HDL   Gout   Essential hypertension   Aortic stenosis   History of malignant neoplasm of large intestine   Osteoarthritis of left knee   Knee osteoarthritis   Past Medical History  Diagnosis Date  . Gout   . Hypertension   . Elevated HDL   . Aortic valve disease   . Kidney stones   . Heart murmur   . Arthritis     "spinal" (11/16/2015)  . Colon cancer ([place])     precancer-partial colectomy  . Squamous cell skin cancer     Surgeries: Procedure(s): LEFT KNEE UNICOMPARTMENTAL  on 11/16/2015   Consultants (if any):    Discharged Condition: Improved  Hospital Course: Barry Decker is an 82 y.o. male who was admitted 11/16/2015 with a diagnosis of Primary osteoarthritis of left knee and went to the operating room on 11/16/2015 and underwent the above named procedures.    He was given perioperative antibiotics:  Anti-infectives    Start     Dose/Rate Route Frequency Ordered Stop   11/16/15 1530  ceFAZolin (ANCEF) IVPB 2g/100 mL premix     2 g 200 mL/hr over 30 Minutes Intravenous Every 6 hours 11/16/15 1348 11/16/15 2207   11/16/15 0930  ceFAZolin (ANCEF) IVPB 2g/100 mL premix     2 g 200 mL/hr over 30 Minutes Intravenous To [facility] 11/16/15 0657 11/16/15 1005    .  He was given sequential compression devices, early ambulation, and ASA 325 for DVT prophylaxis.  He benefited maximally from the hospital stay and there were no complications.    Recent vital signs:  Filed Vitals:   11/17/15 0043 11/17/15 0532  BP: 133/83 150/65  Pulse: 67 94  Temp: 98.7 F (37.1 C) 98 F (36.7 C)  Resp: 16 16    Recent laboratory  studies:  Lab Results  Component Value Date   HGB 13.5 11/05/2015   HGB 14.3 11/11/2013   HGB 14.1 10/25/2012   Lab Results  Component Value Date   WBC 5.4 11/05/2015   PLT 262 11/05/2015   Lab Results  Component Value Date   INR 1.06 11/05/2015   Lab Results  Component Value Date   NA 137 11/05/2015   K 4.4 11/05/2015   CL 104 11/05/2015   CO2 25 11/05/2015   BUN 18 11/05/2015   CREATININE 0.91 11/05/2015   GLUCOSE 111* 11/05/2015    Discharge Medications:     Medication List    ASK your doctor about these medications        allopurinol 300 MG tablet  Commonly known as:  ZYLOPRIM  Take 1 tablet (300 mg total) by mouth daily.     amLODipine 10 MG tablet  Commonly known as:  NORVASC  Take 1 tablet (10 mg total) by mouth daily.     aspirin 81 MG tablet  Take 81 mg by mouth daily.     IRON PO  Take 65 mg by mouth.     potassium citrate 10 MEQ (1080 MG) SR tablet  Commonly known as:  UROCIT-K  Take 1 tablet (10 mEq total) by mouth 3 (three) times daily with meals.     quinapril 40 MG tablet  Commonly known as:  ACCUPRIL  Take 1 tablet (40 mg total) by mouth daily.     SLEEP AID (DIPHENHYDRAMINE) 25 MG tablet  Generic drug:  diphenhydrAMINE  Take 25 mg by mouth at bedtime as needed for sleep. Reported on 10/26/2015        Diagnostic Studies: Dg Knee Left Port  11/16/2015  CLINICAL DATA:  Postop day 0 medial unicompartment left knee arthroplasty. EXAM: PORTABLE LEFT KNEE - 1-2 VIEW COMPARISON:  09/20/2010, 07/02/2008. FINDINGS: Medial unicompartment left knee arthroplasty with anatomic alignment. No complicating features. Chondrocalcinosis involving the lateral meniscus. Mild lateral compartment and mild patellofemoral compartment joint space narrowing. IMPRESSION: 1. Anatomic alignment post medial unicompartment left knee arthroplasty without acute complicating features. 2. Mild osteoarthritis involving the lateral and patellofemoral compartments, secondary to  CPPD. Electronically Signed   By: Evangeline Dakin M.D.   On: 11/16/2015 13:05    Disposition: Final discharge disposition not confirmed       Signed: Shianna Bally D 11/17/2015, 7:49 AM

## 2015-11-17 NOTE — Progress Notes (Signed)
Orthopedic Tech Progress Note Patient Details:  Barry Decker Apr 20, 1933 [id number]  Patient ID: Barry Decker, male   DOB: Dec 07, 1932, 82 y.o.   MRN: [id number] Applied cpm 0-60  Barry Decker 11/17/2015, 6:22 AM

## 2015-11-17 NOTE — Progress Notes (Addendum)
Physical Therapy Treatment Patient Details Name: Barry Decker MRN: [id number] DOB: 06-28-1932 Today's Date: 11/17/2015    History of Present Illness Pt admitted for elective L unicompartment knee.    PT Comments    Pt performed increased mobility and increased activity.  Pt remains limited due to drop in BP when changing positions.  RN informed of drop.  Case manager reports pt will be set up for out patient PT.  PTA will attempt stairs this pm pending dizziness.  Pt remains guarded with tx.    Follow Up Recommendations  Supervision/Assistance - 24 hour;Outpatient PT     Equipment Recommendations  3in1 (PT)    Recommendations for Other Services       Precautions / Restrictions Precautions Precautions: Knee Precaution Booklet Issued: Yes (comment) Precaution Comments: instructed on no pillow under knee Required Braces or Orthoses: Knee Immobilizer - Left (Pt educated to sleep in brace to avoid resting in flexed position.  ) Knee Immobilizer - Left: Discontinue once straight leg raise with < 10 degree lag (Pt able to perform SLRs without extensor lag.  ) Restrictions Weight Bearing Restrictions: Yes LLE Weight Bearing: Weight bearing as tolerated    Mobility  Bed Mobility Overal bed mobility: Needs Assistance Bed Mobility: Supine to Sit     Supine to sit: Supervision     General bed mobility comments: pt able to manage LEs and utilize long sit technique without difficulty  Transfers Overall transfer level: Needs assistance Equipment used: Rolling walker (2 wheeled) Transfers: Sit to/from Stand Sit to Stand: Min guard Stand pivot transfers: Min guard       General transfer comment: v/c's for hand placement and sequencing, performed multiple reps to readjust height of RW.    Ambulation/Gait Ambulation/Gait assistance: Min guard Ambulation Distance (Feet): 20 Feet (+18 ft.  ) Assistive device: Rolling walker (2 wheeled) Gait Pattern/deviations: Step-to  pattern;Trunk flexed;Antalgic Gait velocity: slow guarded.   Gait velocity interpretation: Below normal speed for age/gender General Gait Details: Pt required cues for L heel strike, L knee extension, upper trunk control and pushing RW vs. picking up RW.  Pt c/o of dizziness, became diaphoretic and BP dropped from 149/65 to 121/56.  RN informed and aware.  After walking additional 18 ft pt c/o dizziness and pressure 132/58.     Stairs            Wheelchair Mobility    Modified Rankin (Stroke Patients Only)       Balance Overall balance assessment: Needs assistance   Sitting balance-Leahy Scale: Good       Standing balance-Leahy Scale: Poor                      Cognition Arousal/Alertness: Awake/alert Behavior During Therapy: WFL for tasks assessed/performed Overall Cognitive Status: Within Functional Limits for tasks assessed                      Exercises Total Joint Exercises Ankle Circles/Pumps: AROM;Both;10 reps;Supine Quad Sets: AROM;Left;10 reps;Supine Short Arc Quad: AROM;Left;10 reps;Supine Heel Slides: AROM;Left;10 reps;Supine Hip ABduction/ADduction: AROM;Left;10 reps;Supine Straight Leg Raises: AROM;Left;10 reps;Supine Goniometric ROM: 12-70 degrees AROM.      General Comments        Pertinent Vitals/Pain Pain Assessment: 0-10 Pain Score: 7  Pain Location: L knee Pain Descriptors / Indicators: Sore Pain Intervention(s): Monitored during session;Repositioned;Ice applied    Home Living  Prior Function            PT Goals (current goals can now be found in the care plan section) Acute Rehab PT Goals Patient Stated Goal: to get strong again Potential to Achieve Goals: Good Progress towards PT goals: Progressing toward goals    Frequency  7X/week    PT Plan Discharge plan needs to be updated    Co-evaluation             End of Session Equipment Utilized During Treatment: Gait  belt Activity Tolerance: Other (comment);Treatment limited secondary to medical complications (Comment) (dizziness and nausea.)       Time: [id number] PT Time Calculation (min) (ACUTE ONLY): 50 min  Charges:  $Gait Training: 8-22 mins $Therapeutic Exercise: 8-22 mins $Therapeutic Activity: 8-22 mins                    G Codes:      Cristela Blue 2015-12-10, 11:51 AM Governor Rooks, PTA pager [phone]

## 2015-11-17 NOTE — Care Management Note (Addendum)
Case Management Note  Patient Details  Name: Barry Decker MRN: [id number] Date of Birth: 02/07/1933  Subjective/Objective:   82 yr old male s/p left uniknee arthroplasty.                Action/Plan:  Case manager spoke with patient and wife at the bedside conerning discharge plan. Patient was scheduled to begin outpatient therapy on 11/18/15, but he and wife requested home health initially. PT also feel patient would benefit from a week of home therapy prior to outpatient. CM contacted Dr. Debroah Loop office, spoke with PA Benjie Karvonen who stated [place] can ne ordered, then patient will progress to outpatient therapy. CM did explain this to patient and his wife. Choice was offered for Home Health agencies, referral was called to [facility], [facility] Liaison. Patient already has a rolling walker. 3in1 was delivered to patient's room. CPM will be delivered to patient's home.     Expected Discharge Date:   11/17/15               Expected Discharge Plan:  [place]  In-House Referral:     Discharge planning Services  CM Consult  Post Acute Care Choice:  Durable Medical Equipment, Home Health Choice offered to:  Patient, Spouse  DME Arranged:  3-N-1 DME Agency:  [employer]  HH Arranged:  PT HH Agency:  [facility]  Status of Service:  Completed, signed off  Medicare Important Message Given:    Date Medicare IM Given:    Medicare IM give by:    Date Additional Medicare IM Given:    Additional Medicare Important Message give by:     If discussed at [place] of Stay Meetings, dates discussed:    Additional Comments: Dr. Percell Miller called and stated that patient does not require home health and will go to out patient therapy as scheduled. CM notified [place] liaison of change.     Ninfa Meeker, RN 11/17/2015, 11:41 AM

## 2015-11-17 NOTE — Discharge Instructions (Signed)
Keep dressing C/D/I

## 2015-11-17 NOTE — Care Management Obs Status (Signed)
[facility] NOTIFICATION   Patient Details  Name: Barry Decker MRN: [id number] Date of Birth: 1932/08/08   Medicare Observation Status Notification Given:  Yes    Ninfa Meeker, RN 11/17/2015, 2:31 PM

## 2015-11-17 NOTE — Evaluation (Signed)
Occupational Therapy Evaluation Patient Details Name: CAMAREN TAI MRN: [id number] DOB: 11/28/1932 Today's Date: 11/17/2015    History of Present Illness Pt admitted for elective L unicompartment knee.   Clinical Impression   This 82 year old man was admitted for the above surgery. All education was completed. No further OT is needed at this time    Follow Up Recommendations  No OT follow up;Supervision/Assistance - 24 hour    Equipment Recommendations  None recommended by OT    Recommendations for Other Services       Precautions / Restrictions Precautions Precautions: Knee Precaution Booklet Issued: Yes (comment) Precaution Comments: instructed on no pillow under knee Required Braces or Orthoses: Knee Immobilizer - Left (Pt educated to sleep in brace to avoid resting in flexed position.  ) Knee Immobilizer - Left: Discontinue once straight leg raise with < 10 degree lag (Pt able to perform SLRs without extensor lag.  ) Restrictions Weight Bearing Restrictions: Yes LLE Weight Bearing: Weight bearing as tolerated      Mobility Bed Mobility Overal bed mobility: Needs Assistance           General bed mobility comments: oob  Transfers Overall transfer level: Needs assistance Equipment used: Rolling walker (2 wheeled) Transfers: Sit to/from Stand Sit to Stand: Min guard;Min assist Stand pivot transfers: Min guard       General transfer comment: vcs for UE/LE placement; min guard for safety standing; min A to support LLE as pt did not control descent well    Balance Overall balance assessment: Needs assistance   Sitting balance-Leahy Scale: Good       Standing balance-Leahy Scale: Poor                              ADL Overall ADL's : Needs assistance/impaired     Grooming: Set up;Sitting   Upper Body Bathing: Set up;Standing   Lower Body Bathing: Minimal assistance;Sit to/from stand   Upper Body Dressing : Set up;Sitting   Lower  Body Dressing: Minimal assistance;Sit to/from stand   Toilet Transfer: Min guard;Ambulation;RW (chair)   Toileting- Clothing Manipulation and Hygiene: Minimal assistance;Sit to/from stand   Tub/ Shower Transfer: Walk-in shower;Min guard;Ambulation;Shower seat     General ADL Comments: wife present and she will assist as needed.  Simulated shower ledge.  Educated on sidestepping through tight Garment/textile technologist      Pertinent Vitals/Pain Pain Assessment: 0-10 Pain Score: 4  Pain Location: L knee Pain Descriptors / Indicators: Sore Pain Intervention(s): Limited activity within patient's tolerance;Monitored during session;Premedicated before session;Repositioned (declined further ice--had just been removed)     Hand Dominance     Extremity/Trunk Assessment Upper Extremity Assessment Upper Extremity Assessment: Overall WFL for tasks assessed           Communication Communication Communication: No difficulties   Cognition Arousal/Alertness: Awake/alert Behavior During Therapy: WFL for tasks assessed/performed Overall Cognitive Status: Within Functional Limits for tasks assessed                     General Comments       Exercises       Shoulder Instructions      Home Living Family/patient expects to be discharged to:: Private residence Living Arrangements: Spouse/significant other Available Help at Discharge: Family;Available PRN/intermittently  Bathroom Shower/Tub: Occupational psychologist: Standard     Home Equipment: Bedside commode;Shower seat;Walker - 2 wheels          Prior Functioning/Environment Level of Independence: Independent             OT Diagnosis: Generalized weakness   OT Problem List:     OT Treatment/Interventions:      OT Goals(Current goals can be found in the care plan section) Acute Rehab OT Goals Patient Stated Goal: to get strong again  OT Frequency:      Barriers to D/C:            Co-evaluation              End of Session CPM Left Knee CPM Left Knee: Off  Activity Tolerance: Patient tolerated treatment well Patient left: in chair;with call bell/phone within reach;with family/visitor present   Time: [id number] OT Time Calculation (min): 16 min Charges:  OT General Charges $OT Visit: 1 Procedure OT Evaluation $OT Eval Low Complexity: 1 Procedure G-Codes: OT G-codes **NOT FOR INPATIENT CLASS** Functional Assessment Tool Used: clinical observation and judgment Functional Limitation: Self care Self Care Current Status [id number]): At least 20 percent but less than 40 percent impaired, limited or restricted Self Care Goal Status [id number]): At least 20 percent but less than 40 percent impaired, limited or restricted Self Care Discharge Status [id number]): At least 20 percent but less than 40 percent impaired, limited or restricted  Destina Mantei 11/17/2015, 2:44 PM  Lesle Chris, OTR/L [phone] 11/17/2015

## 2015-11-17 NOTE — Progress Notes (Signed)
OT Cancellation Note  Patient Details Name: Barry Decker MRN: [id number] DOB: [birth year]   Cancelled Treatment:    Reason Eval/Treat Not Completed: Other (comment).  Noted pt had dizziness with PT and finished about 30 minutes ago. Will return later for OT evaluation  Jozsef Wescoat 11/17/2015, 12:05 PM  Lesle Chris, OTR/L [id number] 11/17/2015

## 2015-11-17 NOTE — Progress Notes (Signed)
Physical Therapy Treatment Patient Details Name: Barry Decker MRN: [id number] DOB: 11/24/1932 Today's Date: 11/17/2015    History of Present Illness Pt admitted for elective L unicompartment knee.    PT Comments    Pt performed increased mobility, no c/o dizziness.  Pt performed stair and gait training with supervision and is ready for d/c.  RN informed.    Follow Up Recommendations  Supervision/Assistance - 24 hour;Outpatient PT     Equipment Recommendations  3in1 (PT)    Recommendations for Other Services       Precautions / Restrictions Precautions Precautions: Knee Precaution Booklet Issued: Yes (comment) Precaution Comments: instructed on no pillow under knee Required Braces or Orthoses: Knee Immobilizer - Left (educated pt to sleep in brace to avoid resting in flexed position.  ) Knee Immobilizer - Left: Discontinue once straight leg raise with < 10 degree lag (d/c brace as pt able to perform SLRs.  ) Restrictions Weight Bearing Restrictions: Yes LLE Weight Bearing: Weight bearing as tolerated    Mobility  Bed Mobility               General bed mobility comments: Pt received in recliner chair.    Transfers Overall transfer level: Needs assistance Equipment used: Rolling walker (2 wheeled) Transfers: Sit to/from Stand Sit to Stand: Supervision Stand pivot transfers: Supervision       General transfer comment: VCs for hand placement and advancement of LLE forward.    Ambulation/Gait Ambulation/Gait assistance: Supervision Ambulation Distance (Feet): 220 Feet Assistive device: Rolling walker (2 wheeled) Gait Pattern/deviations: Step-to pattern;Trunk flexed;Antalgic;Step-through pattern Gait velocity: slow guarded.   Gait velocity interpretation: Below normal speed for age/gender General Gait Details: Pt required cues for L heel strike, L knee extension, upper trunk control and pushing RW vs. picking up RW.  Pt able to progress from step to, to step  through sequencing.     Stairs Stairs: Yes Stairs assistance: Supervision Stair Management: Sideways Number of Stairs: 5 General stair comments: Cues for sequencing and hand placement on railing.  Pt performed with wife present who observed technique.    Wheelchair Mobility    Modified Rankin (Stroke Patients Only)       Balance Overall balance assessment: Needs assistance   Sitting balance-Leahy Scale: Good       Standing balance-Leahy Scale: Fair                      Cognition Arousal/Alertness: Awake/alert Behavior During Therapy: WFL for tasks assessed/performed Overall Cognitive Status: Within Functional Limits for tasks assessed                      Exercises Total Joint Exercises Long Arc Quad: AROM;10 reps Knee Flexion: AROM;AAROM;Left;5 reps (1x5 AROM, 1x5 AAROM with 10 sec hold at end range.  )    General Comments        Pertinent Vitals/Pain Pain Assessment: 0-10 Pain Score: 4  Pain Location: L knee Pain Descriptors / Indicators: Sore Pain Intervention(s): Limited activity within patient's tolerance;Monitored during session;Premedicated before session;Repositioned;Ice applied    Home Living Family/patient expects to be discharged to:: Private residence Living Arrangements: Spouse/significant other Available Help at Discharge: Family;Available PRN/intermittently         Home Equipment: Bedside commode;Shower seat;Walker - 2 wheels      Prior Function Level of Independence: Independent          PT Goals (current goals can now be found in the care plan  section) Acute Rehab PT Goals Patient Stated Goal: to get strong again Potential to Achieve Goals: Good Progress towards PT goals: Progressing toward goals    Frequency  7X/week    PT Plan Current plan remains appropriate    Co-evaluation             End of Session Equipment Utilized During Treatment: Gait belt Activity Tolerance: Patient tolerated treatment  well Patient left: in chair;with call bell/phone within reach     Time: 1534-1557 PT Time Calculation (min) (ACUTE ONLY): 23 min  Charges:  $Gait Training: 8-22 mins $Therapeutic Activity: 8-22 mins                    G Codes:      Cristela Blue 18-Nov-2015, 4:49 PM  Governor Rooks, PTA pager [phone]

## 2015-11-18 ENCOUNTER — Encounter (HOSPITAL_COMMUNITY): Payer: Self-pay | Admitting: Orthopedic Surgery

## 2015-11-18 DIAGNOSIS — M1712 Unilateral primary osteoarthritis, left knee: Secondary | ICD-10-CM | POA: Diagnosis not present

## 2015-11-18 DIAGNOSIS — M25562 Pain in left knee: Secondary | ICD-10-CM | POA: Diagnosis not present

## 2015-11-18 DIAGNOSIS — M25662 Stiffness of left knee, not elsewhere classified: Secondary | ICD-10-CM | POA: Diagnosis not present

## 2015-11-18 DIAGNOSIS — Z96652 Presence of left artificial knee joint: Secondary | ICD-10-CM | POA: Diagnosis not present

## 2015-11-22 DIAGNOSIS — Z96652 Presence of left artificial knee joint: Secondary | ICD-10-CM | POA: Diagnosis not present

## 2015-11-22 DIAGNOSIS — M25562 Pain in left knee: Secondary | ICD-10-CM | POA: Diagnosis not present

## 2015-11-22 DIAGNOSIS — M25662 Stiffness of left knee, not elsewhere classified: Secondary | ICD-10-CM | POA: Diagnosis not present

## 2015-11-22 DIAGNOSIS — M1712 Unilateral primary osteoarthritis, left knee: Secondary | ICD-10-CM | POA: Diagnosis not present

## 2015-11-23 DIAGNOSIS — H18412 Arcus senilis, left eye: Secondary | ICD-10-CM | POA: Diagnosis not present

## 2015-11-23 DIAGNOSIS — H18411 Arcus senilis, right eye: Secondary | ICD-10-CM | POA: Diagnosis not present

## 2015-11-23 DIAGNOSIS — I10 Essential (primary) hypertension: Secondary | ICD-10-CM | POA: Diagnosis not present

## 2015-11-23 DIAGNOSIS — H02839 Dermatochalasis of unspecified eye, unspecified eyelid: Secondary | ICD-10-CM | POA: Diagnosis not present

## 2015-11-25 DIAGNOSIS — Z96652 Presence of left artificial knee joint: Secondary | ICD-10-CM | POA: Diagnosis not present

## 2015-11-25 DIAGNOSIS — M25662 Stiffness of left knee, not elsewhere classified: Secondary | ICD-10-CM | POA: Diagnosis not present

## 2015-11-25 DIAGNOSIS — M1712 Unilateral primary osteoarthritis, left knee: Secondary | ICD-10-CM | POA: Diagnosis not present

## 2015-11-25 DIAGNOSIS — M25562 Pain in left knee: Secondary | ICD-10-CM | POA: Diagnosis not present

## 2015-11-30 DIAGNOSIS — Z96652 Presence of left artificial knee joint: Secondary | ICD-10-CM | POA: Diagnosis not present

## 2015-11-30 DIAGNOSIS — M25562 Pain in left knee: Secondary | ICD-10-CM | POA: Diagnosis not present

## 2015-11-30 DIAGNOSIS — M1712 Unilateral primary osteoarthritis, left knee: Secondary | ICD-10-CM | POA: Diagnosis not present

## 2015-11-30 DIAGNOSIS — M25662 Stiffness of left knee, not elsewhere classified: Secondary | ICD-10-CM | POA: Diagnosis not present

## 2015-12-01 DIAGNOSIS — M1712 Unilateral primary osteoarthritis, left knee: Secondary | ICD-10-CM | POA: Diagnosis not present

## 2015-12-02 DIAGNOSIS — M25662 Stiffness of left knee, not elsewhere classified: Secondary | ICD-10-CM | POA: Diagnosis not present

## 2015-12-02 DIAGNOSIS — M25562 Pain in left knee: Secondary | ICD-10-CM | POA: Diagnosis not present

## 2015-12-02 DIAGNOSIS — Z96652 Presence of left artificial knee joint: Secondary | ICD-10-CM | POA: Diagnosis not present

## 2015-12-02 DIAGNOSIS — M1712 Unilateral primary osteoarthritis, left knee: Secondary | ICD-10-CM | POA: Diagnosis not present

## 2015-12-07 DIAGNOSIS — M25562 Pain in left knee: Secondary | ICD-10-CM | POA: Diagnosis not present

## 2015-12-07 DIAGNOSIS — Z96652 Presence of left artificial knee joint: Secondary | ICD-10-CM | POA: Diagnosis not present

## 2015-12-07 DIAGNOSIS — M1712 Unilateral primary osteoarthritis, left knee: Secondary | ICD-10-CM | POA: Diagnosis not present

## 2015-12-07 DIAGNOSIS — M25662 Stiffness of left knee, not elsewhere classified: Secondary | ICD-10-CM | POA: Diagnosis not present

## 2015-12-09 DIAGNOSIS — M25562 Pain in left knee: Secondary | ICD-10-CM | POA: Diagnosis not present

## 2015-12-09 DIAGNOSIS — M25662 Stiffness of left knee, not elsewhere classified: Secondary | ICD-10-CM | POA: Diagnosis not present

## 2015-12-09 DIAGNOSIS — M1712 Unilateral primary osteoarthritis, left knee: Secondary | ICD-10-CM | POA: Diagnosis not present

## 2015-12-09 DIAGNOSIS — Z96652 Presence of left artificial knee joint: Secondary | ICD-10-CM | POA: Diagnosis not present

## 2015-12-14 DIAGNOSIS — Z96652 Presence of left artificial knee joint: Secondary | ICD-10-CM | POA: Diagnosis not present

## 2015-12-14 DIAGNOSIS — M25562 Pain in left knee: Secondary | ICD-10-CM | POA: Diagnosis not present

## 2015-12-14 DIAGNOSIS — M25662 Stiffness of left knee, not elsewhere classified: Secondary | ICD-10-CM | POA: Diagnosis not present

## 2015-12-14 DIAGNOSIS — M1712 Unilateral primary osteoarthritis, left knee: Secondary | ICD-10-CM | POA: Diagnosis not present

## 2015-12-16 DIAGNOSIS — M1712 Unilateral primary osteoarthritis, left knee: Secondary | ICD-10-CM | POA: Diagnosis not present

## 2015-12-16 DIAGNOSIS — M25662 Stiffness of left knee, not elsewhere classified: Secondary | ICD-10-CM | POA: Diagnosis not present

## 2015-12-16 DIAGNOSIS — Z96652 Presence of left artificial knee joint: Secondary | ICD-10-CM | POA: Diagnosis not present

## 2015-12-16 DIAGNOSIS — M25562 Pain in left knee: Secondary | ICD-10-CM | POA: Diagnosis not present

## 2015-12-20 DIAGNOSIS — M25562 Pain in left knee: Secondary | ICD-10-CM | POA: Diagnosis not present

## 2015-12-20 DIAGNOSIS — M25662 Stiffness of left knee, not elsewhere classified: Secondary | ICD-10-CM | POA: Diagnosis not present

## 2015-12-20 DIAGNOSIS — M1712 Unilateral primary osteoarthritis, left knee: Secondary | ICD-10-CM | POA: Diagnosis not present

## 2015-12-20 DIAGNOSIS — Z96652 Presence of left artificial knee joint: Secondary | ICD-10-CM | POA: Diagnosis not present

## 2015-12-23 DIAGNOSIS — M25662 Stiffness of left knee, not elsewhere classified: Secondary | ICD-10-CM | POA: Diagnosis not present

## 2015-12-23 DIAGNOSIS — M25562 Pain in left knee: Secondary | ICD-10-CM | POA: Diagnosis not present

## 2015-12-23 DIAGNOSIS — M1712 Unilateral primary osteoarthritis, left knee: Secondary | ICD-10-CM | POA: Diagnosis not present

## 2015-12-23 DIAGNOSIS — Z96652 Presence of left artificial knee joint: Secondary | ICD-10-CM | POA: Diagnosis not present

## 2015-12-28 DIAGNOSIS — M25562 Pain in left knee: Secondary | ICD-10-CM | POA: Diagnosis not present

## 2015-12-28 DIAGNOSIS — Z96652 Presence of left artificial knee joint: Secondary | ICD-10-CM | POA: Diagnosis not present

## 2015-12-28 DIAGNOSIS — M1712 Unilateral primary osteoarthritis, left knee: Secondary | ICD-10-CM | POA: Diagnosis not present

## 2015-12-28 DIAGNOSIS — M25662 Stiffness of left knee, not elsewhere classified: Secondary | ICD-10-CM | POA: Diagnosis not present

## 2015-12-29 DIAGNOSIS — M1712 Unilateral primary osteoarthritis, left knee: Secondary | ICD-10-CM | POA: Diagnosis not present

## 2015-12-30 DIAGNOSIS — M25662 Stiffness of left knee, not elsewhere classified: Secondary | ICD-10-CM | POA: Diagnosis not present

## 2015-12-30 DIAGNOSIS — M1712 Unilateral primary osteoarthritis, left knee: Secondary | ICD-10-CM | POA: Diagnosis not present

## 2015-12-30 DIAGNOSIS — M25562 Pain in left knee: Secondary | ICD-10-CM | POA: Diagnosis not present

## 2015-12-30 DIAGNOSIS — Z96652 Presence of left artificial knee joint: Secondary | ICD-10-CM | POA: Diagnosis not present

## 2016-01-04 DIAGNOSIS — M1712 Unilateral primary osteoarthritis, left knee: Secondary | ICD-10-CM | POA: Diagnosis not present

## 2016-01-04 DIAGNOSIS — M25662 Stiffness of left knee, not elsewhere classified: Secondary | ICD-10-CM | POA: Diagnosis not present

## 2016-01-04 DIAGNOSIS — M25562 Pain in left knee: Secondary | ICD-10-CM | POA: Diagnosis not present

## 2016-01-04 DIAGNOSIS — Z96652 Presence of left artificial knee joint: Secondary | ICD-10-CM | POA: Diagnosis not present

## 2016-01-11 DIAGNOSIS — M25662 Stiffness of left knee, not elsewhere classified: Secondary | ICD-10-CM | POA: Diagnosis not present

## 2016-01-11 DIAGNOSIS — Z96652 Presence of left artificial knee joint: Secondary | ICD-10-CM | POA: Diagnosis not present

## 2016-01-11 DIAGNOSIS — M25562 Pain in left knee: Secondary | ICD-10-CM | POA: Diagnosis not present

## 2016-01-11 DIAGNOSIS — M1712 Unilateral primary osteoarthritis, left knee: Secondary | ICD-10-CM | POA: Diagnosis not present

## 2016-01-13 DIAGNOSIS — M1712 Unilateral primary osteoarthritis, left knee: Secondary | ICD-10-CM | POA: Diagnosis not present

## 2016-01-13 DIAGNOSIS — M25562 Pain in left knee: Secondary | ICD-10-CM | POA: Diagnosis not present

## 2016-01-13 DIAGNOSIS — M25662 Stiffness of left knee, not elsewhere classified: Secondary | ICD-10-CM | POA: Diagnosis not present

## 2016-01-13 DIAGNOSIS — Z96652 Presence of left artificial knee joint: Secondary | ICD-10-CM | POA: Diagnosis not present

## 2016-01-20 DIAGNOSIS — M1712 Unilateral primary osteoarthritis, left knee: Secondary | ICD-10-CM | POA: Diagnosis not present

## 2016-01-20 DIAGNOSIS — M25562 Pain in left knee: Secondary | ICD-10-CM | POA: Diagnosis not present

## 2016-01-20 DIAGNOSIS — M25662 Stiffness of left knee, not elsewhere classified: Secondary | ICD-10-CM | POA: Diagnosis not present

## 2016-01-20 DIAGNOSIS — Z96652 Presence of left artificial knee joint: Secondary | ICD-10-CM | POA: Diagnosis not present

## 2016-01-27 DIAGNOSIS — M25562 Pain in left knee: Secondary | ICD-10-CM | POA: Diagnosis not present

## 2016-01-27 DIAGNOSIS — Z96652 Presence of left artificial knee joint: Secondary | ICD-10-CM | POA: Diagnosis not present

## 2016-01-27 DIAGNOSIS — M1712 Unilateral primary osteoarthritis, left knee: Secondary | ICD-10-CM | POA: Diagnosis not present

## 2016-01-27 DIAGNOSIS — M25662 Stiffness of left knee, not elsewhere classified: Secondary | ICD-10-CM | POA: Diagnosis not present

## 2016-02-14 DIAGNOSIS — D239 Other benign neoplasm of skin, unspecified: Secondary | ICD-10-CM | POA: Diagnosis not present

## 2016-02-14 DIAGNOSIS — D179 Benign lipomatous neoplasm, unspecified: Secondary | ICD-10-CM | POA: Diagnosis not present

## 2016-02-14 DIAGNOSIS — L821 Other seborrheic keratosis: Secondary | ICD-10-CM | POA: Diagnosis not present

## 2016-02-15 DIAGNOSIS — N3000 Acute cystitis without hematuria: Secondary | ICD-10-CM | POA: Diagnosis not present

## 2016-02-22 DIAGNOSIS — R351 Nocturia: Secondary | ICD-10-CM | POA: Diagnosis not present

## 2016-03-06 DIAGNOSIS — R310 Gross hematuria: Secondary | ICD-10-CM | POA: Diagnosis not present

## 2016-03-06 DIAGNOSIS — N3001 Acute cystitis with hematuria: Secondary | ICD-10-CM | POA: Diagnosis not present

## 2016-03-06 DIAGNOSIS — N200 Calculus of kidney: Secondary | ICD-10-CM | POA: Diagnosis not present

## 2016-03-09 DIAGNOSIS — N200 Calculus of kidney: Secondary | ICD-10-CM | POA: Diagnosis not present

## 2016-03-09 DIAGNOSIS — R310 Gross hematuria: Secondary | ICD-10-CM | POA: Diagnosis not present

## 2016-03-13 ENCOUNTER — Ambulatory Visit: Payer: PPO | Admitting: Family Medicine

## 2016-03-14 ENCOUNTER — Other Ambulatory Visit: Payer: Self-pay | Admitting: Urology

## 2016-03-14 DIAGNOSIS — D4112 Neoplasm of uncertain behavior of left renal pelvis: Secondary | ICD-10-CM

## 2016-03-16 ENCOUNTER — Ambulatory Visit (HOSPITAL_COMMUNITY)
Admission: RE | Admit: 2016-03-16 | Discharge: 2016-03-16 | Disposition: A | Discharge status: Home / Self Care | Payer: PPO | Source: Ambulatory Visit | Attending: Urology | Admitting: Urology

## 2016-03-16 DIAGNOSIS — I77811 Abdominal aortic ectasia: Secondary | ICD-10-CM | POA: Diagnosis not present

## 2016-03-16 DIAGNOSIS — N281 Cyst of kidney, acquired: Secondary | ICD-10-CM | POA: Diagnosis not present

## 2016-03-16 DIAGNOSIS — I700 Atherosclerosis of aorta: Secondary | ICD-10-CM | POA: Diagnosis not present

## 2016-03-16 DIAGNOSIS — K760 Fatty (change of) liver, not elsewhere classified: Secondary | ICD-10-CM | POA: Insufficient documentation

## 2016-03-16 DIAGNOSIS — D4112 Neoplasm of uncertain behavior of left renal pelvis: Secondary | ICD-10-CM | POA: Diagnosis not present

## 2016-03-16 LAB — POCT I-STAT CREATININE: Creatinine, Ser: 1 mg/dL (ref 0.61–1.24)

## 2016-03-16 MED ORDER — GADOBENATE DIMEGLUMINE 529 MG/ML IV SOLN
20.0000 mL | Freq: Once | INTRAVENOUS | Status: AC | PRN
  Administered 2016-03-16: 20 mL via INTRAVENOUS

## 2016-03-17 ENCOUNTER — Encounter: Payer: Self-pay | Admitting: Family Medicine

## 2016-03-17 ENCOUNTER — Ambulatory Visit (INDEPENDENT_AMBULATORY_CARE_PROVIDER_SITE_OTHER): Payer: PPO | Admitting: Family Medicine

## 2016-03-17 VITALS — BP 146/62 | HR 76 | Temp 98.5°F | Wt 233.8 lb

## 2016-03-17 DIAGNOSIS — I10 Essential (primary) hypertension: Secondary | ICD-10-CM

## 2016-03-17 DIAGNOSIS — M1712 Unilateral primary osteoarthritis, left knee: Secondary | ICD-10-CM | POA: Diagnosis not present

## 2016-03-17 NOTE — Patient Instructions (Addendum)
Injections hopefully last 3 months. The longer the better- most we can do is every 3 months. We discussed if it does not- refer to orthopedics (you can just call for this and I will refer you to orthopedics). We should also probably keep an eye on your blood sugar in future visits and steroids can raise blood sugar.   If redness, increased swelling, worsening knee pain more than 24 hours out- please return to care   Have a great trip!

## 2016-03-17 NOTE — Progress Notes (Signed)
Pre visit review using our clinic review tool, if applicable. No additional management support is needed unless otherwise documented below in the visit note. 

## 2016-03-17 NOTE — Progress Notes (Signed)
Subjective:  Barry Decker is a 82 y.o. year old very pleasant male patient who presents for/with See problem oriented charting ROS- no fever, chills, nausea, vomiting. Some knee swelling on right, well healing scar on left.see any ROS included in HPI as well.   Past Medical History-  Patient Active Problem List   Diagnosis Date Noted  . Aortic stenosis 01/31/2007    Priority: High  . HYPERCHOLESTEROLEMIA WITH HIGH HDL 01/31/2007    Priority: Medium  . Gout 01/31/2007    Priority: Medium  . Essential hypertension 01/31/2007    Priority: Medium  . History of malignant neoplasm of large intestine 01/31/2007    Priority: Medium  . Osteoarthritis of left knee 05/25/2014    Priority: Low  . Primary osteoarthritis of left knee 10/29/2015    Medications- reviewed and updated Current Outpatient Prescriptions  Medication Sig Dispense Refill  . allopurinol (ZYLOPRIM) 300 MG tablet Take 1 tablet (300 mg total) by mouth daily. 90 tablet 3  . amLODipine (NORVASC) 10 MG tablet Take 1 tablet (10 mg total) by mouth daily. (Patient taking differently: Take 20 mg by mouth daily. ) 90 tablet 3  . aspirin EC 325 MG tablet Take 1 tablet (325 mg total) by mouth daily. 30 tablet 0  . docusate sodium (COLACE) 100 MG capsule Take 1 capsule (100 mg total) by mouth 2 (two) times daily. Continue this while taking narcotics to help with bowel movements 30 capsule 1  . HYDROcodone-acetaminophen (NORCO) 5-325 MG tablet Take 1-2 tablets by mouth every 4 (four) hours as needed for moderate pain. 90 tablet 0  . IRON PO Take 65 mg by mouth.    . potassium citrate (UROCIT-K) 10 MEQ (1080 MG) SR tablet Take 1 tablet (10 mEq total) by mouth 3 (three) times daily with meals. 300 tablet 1  . quinapril (ACCUPRIL) 40 MG tablet Take 1 tablet (40 mg total) by mouth daily. 90 tablet 3   No current facility-administered medications for this visit.     Objective: BP (!) 146/62   Pulse 76   Temp 98.5 F (36.9 C) (Oral)    Wt 233 lb 12.8 oz (106.1 kg)   SpO2 95%   BMI 30.85 kg/m  Gen: NAD, resting comfortably CV: RRR no murmurs rubs or gallops Lungs: CTAB no crackles, wheeze, rhonchi Ext: no edema Skin: warm, dry Neuro: grossly normal, moves all extremities  Right Knee (left knee healed well after prior surgery_: Normal to inspection with no erythema or large effusion (small effusion noted) or obvious bony abnormalities. Palpation normal with joint line tenderness but no patellar tenderness or condyle tenderness. ROM normal in flexion and extension and lower leg rotation. Some crepitus with this Ligaments with solid consistent endpoints including ACL, PCL, LCL, MCL. Patellar and quadriceps tendons unremarkable. Hamstring and quadriceps strength is normal.  Right knee injection  Consent verbally obtained and verified. Sterile betadine prep. Furthur cleansed with alcohol. Topical analgesic spray: Ethyl chloride. Joint: Right knee Approached in typical fashion with: medial approach while seated Completed without difficulty Meds: 4 cc lidocaine and 1cc of 80mg /cc depomedrol on R Needle: 25 gauge 1 1/2 inch Aftercare instructions and Red flags advised verbally  Assessment/Plan:  Essential hypertension S: controlled per JNC8. Better at home 130s mainly, occasional - and home cuff usually about 5 points higher than our readings. On Amlodipine 10mg , quinapril 40mg  BP Readings from Last 3 Encounters:  03/17/16 (!) 146/62  11/17/15 (!) 154/62  11/05/15 (!) 162/66  A/P:Continue current meds:  as long as in office SBP <150 and most home #s <140.    Primary osteoarthritis of left knee S: Left knee replacement may 30th. Walking 1/2 mile to a mile and doing pretty well. Right knee now acting up for some time but worse in recent weeks 5/10. Going on long trip out of country and wants to be proactive to avoid issues while traveling- [place] several weeks A/P: Right knee injection today- did  not have as much immediate relief as in the past. Hopeful this helps him enjoy his trip more. Also hope lasts 3 months- if not can go back to ortho or we can get into sports medicine to consider viscous supplementation  Return precautions advised.  Garret Reddish, MD

## 2016-03-18 NOTE — Assessment & Plan Note (Signed)
S: Left knee replacement may 30th. Walking 1/2 mile to a mile and doing pretty well. Right knee now acting up for some time but worse in recent weeks 5/10. Going on long trip out of country and wants to be proactive to avoid issues while traveling- [place] for several weeks A/P: Right knee injection today- did not have as much immediate relief as in the past. Hopeful this helps him enjoy his trip more. Also hope lasts 3 months- if not can go back to ortho or we can get into sports medicine to consider viscous supplementation

## 2016-03-18 NOTE — Assessment & Plan Note (Signed)
S: controlled per [facility]. Better at home 130s mainly, occasional - and home cuff usually about 5 points higher than our readings. On Amlodipine 10mg , quinapril 40mg  BP Readings from Last 3 Encounters:  03/17/16 (!) 146/62  11/17/15 (!) 154/62  11/05/15 (!) 162/66  A/P:Continue current meds: as long as in office SBP <150 and most home #s <140.

## 2016-03-20 MED ORDER — METHYLPREDNISOLONE ACETATE 80 MG/ML IJ SUSP
80.0000 mg | Freq: Once | INTRAMUSCULAR | Status: AC
  Administered 2016-03-17: 80 mg via INTRAMUSCULAR

## 2016-03-20 NOTE — Addendum Note (Signed)
Addended by: Elmon Kirschner A on: 03/20/2016 09:49 AM   Modules accepted: Orders

## 2016-04-06 DIAGNOSIS — N3001 Acute cystitis with hematuria: Secondary | ICD-10-CM | POA: Diagnosis not present

## 2016-04-06 DIAGNOSIS — N200 Calculus of kidney: Secondary | ICD-10-CM | POA: Diagnosis not present

## 2016-04-06 DIAGNOSIS — I77811 Abdominal aortic ectasia: Secondary | ICD-10-CM | POA: Diagnosis not present

## 2016-04-06 DIAGNOSIS — N281 Cyst of kidney, acquired: Secondary | ICD-10-CM | POA: Diagnosis not present

## 2016-04-07 ENCOUNTER — Encounter: Payer: Self-pay | Admitting: Family Medicine

## 2016-04-07 DIAGNOSIS — I77811 Abdominal aortic ectasia: Secondary | ICD-10-CM | POA: Insufficient documentation

## 2016-04-10 DIAGNOSIS — M1712 Unilateral primary osteoarthritis, left knee: Secondary | ICD-10-CM | POA: Diagnosis not present

## 2016-04-19 NOTE — Progress Notes (Signed)
Chief Complaint  Patient presents with  . Follow-up     History of Present Illness: 82 yo male with history of HTN, HLD, aortic stenosis, aortic insufficiency here today for cardiac follow up. I saw him as a new patient August 2015. He has been known to have AS for many years. He has a history of rheumatic fever as a child with murmur noted his entire life. He has been followed in [place] until he moved here several years ago. His aortic stenosis has been moderate. Last echo May 2017 with  normal LVEF, mildly dilated LV, severe LVH, moderate AS (mean gradient 28 mm Hg), mild AI, mildly dilated aortic root.   He is here today for follow up. He feels well. No chest pain or SOB. No LE edema. No dizziness or near syncope. He has an upcoming knee replacement.   Primary Care Physician: Garret Reddish, MD   Past Medical History:  Diagnosis Date  . Aortic valve disease   . Arthritis    "spinal" (11/16/2015)  . Colon cancer ([place])    precancer-partial colectomy  . Elevated HDL   . Gout   . Heart murmur   . Hypertension   . Kidney stones   . Squamous cell skin cancer     Past Surgical History:  Procedure Laterality Date  . BACK SURGERY    . CATARACT EXTRACTION W/ INTRAOCULAR LENS  IMPLANT, BILATERAL Bilateral 2017  . COLONOSCOPY W/ POLYPECTOMY    . FACET JOINT INJECTION  2009  . LITHOTRIPSY  2010  . LUMBAR LAMINECTOMY  ~ 1992  . PARTIAL COLECTOMY  1997   precancerours  . PARTIAL KNEE ARTHROPLASTY Left 11/16/2015   Procedure: LEFT KNEE UNICOMPARTMENTAL ;  Surgeon: [name], MD;  Location: [facility];  Service: Orthopedics;  Laterality: Left;  . REPLACEMENT UNICONDYLAR JOINT KNEE Left 11/16/2015  . TONSILLECTOMY    . TYMPANOPLASTY Left 1998   ruptured ear drum with skin graft    Current Outpatient Prescriptions  Medication Sig Dispense Refill  . allopurinol (ZYLOPRIM) 300 MG tablet Take 1 tablet (300 mg total) by mouth daily. 90 tablet 3  . amLODipine (NORVASC) 10 MG tablet  Take 10 mg by mouth daily.    Marland Kitchen aspirin 325 MG tablet Take 162.5 mg by mouth daily.    Marland Kitchen HYDROcodone-acetaminophen (NORCO) 5-325 MG tablet Take 1-2 tablets by mouth every 4 (four) hours as needed for moderate pain. 90 tablet 0  . IRON PO Take 65 mg by mouth.    . potassium citrate (UROCIT-K) 10 MEQ (1080 MG) SR tablet Take 1 tablet (10 mEq total) by mouth 3 (three) times daily with meals. 300 tablet 1  . quinapril (ACCUPRIL) 40 MG tablet Take 1 tablet (40 mg total) by mouth daily. 90 tablet 3  . furosemide (LASIX) 20 MG tablet Take 1 tablet (20 mg total) by mouth daily. 30 tablet 11   No current facility-administered medications for this visit.     No Known Allergies  Social History   Social History  . Marital status: Married    Spouse name: N/A  . Number of children: 5  . Years of education: N/A   Occupational History  . Retired-labor Control and instrumentation engineer    Social History Main Topics  . Smoking status: Former Smoker    Packs/day: 1.00    Years: 20.00    Types: Cigarettes    Quit date: 09/15/1980  . Smokeless tobacco: Never Used  . Alcohol use 7.2 oz/week  10 Standard drinks or equivalent, 1 Glasses of wine, 1 Shots of liquor per week  . Drug use: No  . Sexual activity: No   Other Topics Concern  . Not on file   Social History Narrative   Married. 5 children. 4 grandkids. 1 greatgrandkid.       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       Hobbies: woodworking      Advised to consider advanced directives/hcpoa-thinks wife may have 63 from years ago.    Family History  Problem Relation Age of Onset  . Heart attack Mother 38    former smoker  . Heart attack Father 80    former smoker  . Hypertension Sister   . Heart attack Brother 26    Rheumatic fever    Review of Systems:  As stated in the HPI and otherwise negative.   BP (!) 144/66   Pulse 65   Ht 6\' 1"  (1.854 m)   Wt 236 lb 6.4 oz (107.2 kg)   BMI 31.19 kg/m   Physical  Examination: General: Well developed, well nourished, NAD  HEENT: OP clear, mucus membranes moist  SKIN: warm, dry. No rashes. Neuro: No focal deficits  Musculoskeletal: Muscle strength 5/5 all ext  Psychiatric: Mood and affect normal  Neck: No JVD, no carotid bruits, no thyromegaly, no lymphadenopathy.  Lungs:Clear bilaterally, no wheezes, rhonci, crackles Cardiovascular: Regular rate and rhythm. Loud harsh systolic murmur. No gallops or rubs. Abdomen:Soft. Bowel sounds present. Non-tender.  Extremities: No lower extremity edema. Pulses are 2 + in the bilateral DP/PT.  Echo 10/26/15: Left ventricle: The cavity size was normal. Wall thickness was   increased in a pattern of severe LVH. Systolic function was   normal. The estimated ejection fraction was in the range of 55%   to 60%. Wall motion was normal; there were no regional wall   motion abnormalities. Doppler parameters are consistent with   abnormal left ventricular relaxation (grade 1 diastolic   dysfunction). - Aortic valve: Valve mobility was restricted. There was moderate   stenosis. There was mild regurgitation. Valve area (VTI): 1.39   cm^2. Valve area (Vmax): 1.51 cm^2. Valve area (Vmean): 1.48   cm^2. - Ascending aorta: The ascending aorta was moderately dilated. - Mitral valve: Calcified annulus. - Right ventricle: The cavity size was mildly dilated. - Right atrium: The atrium was mildly dilated.  Impressions:  - Normal LV systolic function; grade 1 diastolic dysfunction;   severe LVH; calcified aortic valve with moderate AS (mean   gradient 28 mmHg) and mild AI; moderately dilated ascending aorta   (suggest CTA or MRA to further asses); mild RAE and RVE.  EKG:  EKG is  ordered today. The ekg ordered today demonstrates   Recent Labs: 11/05/2015: ALT 21; BUN 18; Hemoglobin 13.5; Platelets 262; Potassium 4.4; Sodium 137 03/16/2016: Creatinine, Ser 1.00   Lipid Panel    Component Value Date/Time   CHOL 195  11/11/2013 1014   TRIG 60.0 11/11/2013 1014   TRIG 87 06/22/2006 1013   HDL 70.80 11/11/2013 1014   CHOLHDL 3 11/11/2013 1014   VLDL 12.0 11/11/2013 1014   LDLCALC 112 (H) 11/11/2013 1014   LDLDIRECT 136.2 10/25/2012 1009     Wt Readings from Last 3 Encounters:  04/20/16 236 lb 6.4 oz (107.2 kg)  03/17/16 233 lb 12.8 oz (106.1 kg)  11/16/15 233 lb 11 oz (106 kg)     Other studies Reviewed: Additional studies/ records that were reviewed  today include: . Review of the above records demonstrates:   Assessment and Plan:   1. Aortic stenosis: Moderate stenosis by echo May 2017 with mean gradient 28 mm Hg, peak gradient 49 mm Hg, mild AI. He is completely asymptomatic. I have spent a large portion of the visit reviewing the etiology of aortic stenosis. His is most likely related to history of rheumatic fever. No big changes in last 2 years on serial echo. No indication for aortic valve replacement at this time but he has an upcoming knee surgery. Will repeat echo May 2018. I have reviewed the potential future need for AVR with TAVR as an option.   2. HTN: BP controlled at home. No changes today.    3. Acute on chronic diastolic CHF: BMET today. Start Lasix 20 mg daily.   Current medicines are reviewed at length with the patient today.  The patient does not have concerns regarding medicines.  The following changes have been made:  no change  Labs/ tests ordered today include:   Orders Placed This Encounter  Procedures  . Basic Metabolic Panel (BMET)  . ECHOCARDIOGRAM COMPLETE     Disposition:   FU with me in 6 months   Signed, Lauree Chandler, MD 04/20/2016 12:35 PM    [address] [place], [place], [place]  [address] Phone: [phone]; Fax: [phone]

## 2016-04-20 ENCOUNTER — Ambulatory Visit (INDEPENDENT_AMBULATORY_CARE_PROVIDER_SITE_OTHER): Payer: PPO | Admitting: Cardiovascular Disease

## 2016-04-20 ENCOUNTER — Encounter: Payer: Self-pay | Admitting: Cardiovascular Disease

## 2016-04-20 VITALS — BP 144/66 | HR 65 | Ht 73.0 in | Wt 236.4 lb

## 2016-04-20 DIAGNOSIS — I10 Essential (primary) hypertension: Secondary | ICD-10-CM | POA: Diagnosis not present

## 2016-04-20 DIAGNOSIS — I350 Nonrheumatic aortic (valve) stenosis: Secondary | ICD-10-CM | POA: Diagnosis not present

## 2016-04-20 DIAGNOSIS — I5033 Acute on chronic diastolic (congestive) heart failure: Secondary | ICD-10-CM | POA: Diagnosis not present

## 2016-04-20 LAB — BASIC METABOLIC PANEL
BUN: 19 mg/dL (ref 7–25)
CO2: 27 mmol/L (ref 20–31)
Calcium: 9.3 mg/dL (ref 8.6–10.3)
Chloride: 104 mmol/L (ref 98–110)
Creat: 0.92 mg/dL (ref 0.70–1.11)
Glucose, Bld: 87 mg/dL (ref 65–99)
Potassium: 4.9 mmol/L (ref 3.5–5.3)
Sodium: 139 mmol/L (ref 135–146)

## 2016-04-20 MED ORDER — FUROSEMIDE 20 MG PO TABS: 20.0000 mg | ORAL_TABLET | Freq: Every day | ORAL | 11 refills | Status: DC

## 2016-04-20 NOTE — Patient Instructions (Addendum)
Medication Instructions:  Your physician has recommended you make the following change in your medication: Start furosemide 20 mg by mouth daily   Labwork: Lab work to be done today--BMP  Testing/Procedures: Your physician has requested that you have an echocardiogram. Echocardiography is a painless test that uses sound waves to create images of your heart. It provides your doctor with information about the size and shape of your heart and how well your heart's chambers and valves are working. This procedure takes approximately one hour. There are no restrictions for this procedure.  To be done in May 2018    Follow-Up: Your physician recommends that you schedule a follow-up appointment in: May or June 2018 after echocardiogram.      Any Other Special Instructions Will Be Listed Below (If Applicable).     If you need a refill on your cardiac medications before your next appointment, please call your pharmacy.

## 2016-05-15 DIAGNOSIS — M1712 Unilateral primary osteoarthritis, left knee: Secondary | ICD-10-CM | POA: Diagnosis not present

## 2016-05-31 ENCOUNTER — Other Ambulatory Visit: Payer: Self-pay | Admitting: Family Medicine

## 2016-08-17 ENCOUNTER — Encounter: Payer: Self-pay | Admitting: Gastroenterology

## 2016-09-15 ENCOUNTER — Other Ambulatory Visit: Payer: Self-pay | Admitting: Cardiovascular Disease

## 2016-09-15 ENCOUNTER — Other Ambulatory Visit: Payer: Self-pay | Admitting: Family Medicine

## 2016-10-23 ENCOUNTER — Ambulatory Visit (HOSPITAL_COMMUNITY): Payer: PPO | Attending: Cardiology

## 2016-10-23 ENCOUNTER — Other Ambulatory Visit: Payer: Self-pay

## 2016-10-23 ENCOUNTER — Telehealth: Payer: Self-pay | Admitting: *Deleted

## 2016-10-23 DIAGNOSIS — I350 Nonrheumatic aortic (valve) stenosis: Secondary | ICD-10-CM | POA: Diagnosis not present

## 2016-10-23 NOTE — Telephone Encounter (Signed)
-----   Message from [name], MD sent at 10/23/2016 12:54 PM EDT ----- Echo overall unchanged. He still has moderate AS. I am seeing him later this week. Can we let him know the results? chris

## 2016-10-23 NOTE — Telephone Encounter (Signed)
Pt notified of echo results and findings by phone with verbal understanding. Pt aware to keep appt with Dr. Angelena Form 10/27/16. Pt thanked me for my call today.

## 2016-10-27 ENCOUNTER — Encounter: Payer: Self-pay | Admitting: Cardiovascular Disease

## 2016-10-27 ENCOUNTER — Ambulatory Visit (INDEPENDENT_AMBULATORY_CARE_PROVIDER_SITE_OTHER): Payer: PPO | Admitting: Cardiovascular Disease

## 2016-10-27 VITALS — BP 130/68 | HR 73 | Ht 73.0 in | Wt 239.0 lb

## 2016-10-27 DIAGNOSIS — I5032 Chronic diastolic (congestive) heart failure: Secondary | ICD-10-CM

## 2016-10-27 DIAGNOSIS — I350 Nonrheumatic aortic (valve) stenosis: Secondary | ICD-10-CM

## 2016-10-27 DIAGNOSIS — I10 Essential (primary) hypertension: Secondary | ICD-10-CM

## 2016-10-27 NOTE — Progress Notes (Signed)
Chief Complaint  Patient presents with  . Follow-up    aortic stenosis     History of Present Illness: 83 yo male with history of HTN, HLD, aortic stenosis, aortic insufficiency here today for cardiac follow up. I saw him as a new patient August 2015. He has been known to have AS for many years. He has a history of rheumatic fever as a child with murmur noted his entire life. His aortic stenosis has been moderate. Last echo May 2018 with  normal LVEF, severe LVH, moderate AS (mean gradient 33 mmHg) mild AI, mildly dilated aortic root. Started on Lasix at last visit for mild LE edema.   He is here today for follow up. The patient denies any chest pain, dyspnea, palpitations, lower extremity edema, orthopnea, PND, dizziness, near syncope or syncope. He is very active in his yard.    Primary Care Physician: [name], MD   Past Medical History:  Diagnosis Date  . Aortic valve disease   . Arthritis    "spinal" (11/16/2015)  . Colon cancer ([place])    precancer-partial colectomy  . Elevated HDL   . Gout   . Heart murmur   . Hypertension   . Kidney stones   . Squamous cell skin cancer     Past Surgical History:  Procedure Laterality Date  . BACK SURGERY    . CATARACT EXTRACTION W/ INTRAOCULAR LENS  IMPLANT, BILATERAL Bilateral 2017  . COLONOSCOPY W/ POLYPECTOMY    . FACET JOINT INJECTION  2009  . LITHOTRIPSY  2010  . LUMBAR LAMINECTOMY  ~ 1992  . PARTIAL COLECTOMY  1997   precancerours  . PARTIAL KNEE ARTHROPLASTY Left 11/16/2015   Procedure: LEFT KNEE UNICOMPARTMENTAL ;  Surgeon: [name], MD;  Location: [facility];  Service: Orthopedics;  Laterality: Left;  . REPLACEMENT UNICONDYLAR JOINT KNEE Left 11/16/2015  . TONSILLECTOMY    . TYMPANOPLASTY Left 1998   ruptured ear drum with skin graft    Current Outpatient Prescriptions  Medication Sig Dispense Refill  . allopurinol (ZYLOPRIM) 300 MG tablet Take 1 tablet (300 mg total) by mouth daily. 90 tablet 2  .  amLODipine (NORVASC) 10 MG tablet Take 1 tablet by mouth daily 90 tablet 1  . aspirin 325 MG tablet Take 162.5 mg by mouth daily.    . IRON PO Take 65 mg by mouth.    . potassium citrate (UROCIT-K) 10 MEQ (1080 MG) SR tablet Take 1 tablet (10 mEq total) by mouth 3 (three) times daily with meals. 300 tablet 1  . quinapril (ACCUPRIL) 40 MG tablet Take 1 tablet by mouth daily 90 tablet 2  . furosemide (LASIX) 20 MG tablet Take 1 tablet (20 mg total) by mouth daily. 30 tablet 11   No current facility-administered medications for this visit.     No Known Allergies  Social History   Social History  . Marital status: Married    Spouse name: N/A  . Number of children: 5  . Years of education: N/A   Occupational History  . Retired-labor Control and instrumentation engineer    Social History Main Topics  . Smoking status: Former Smoker    Packs/day: 1.00    Years: 20.00    Types: Cigarettes    Quit date: 09/15/1980  . Smokeless tobacco: Never Used  . Alcohol use 7.2 oz/week    10 Standard drinks or equivalent, 1 Glasses of wine, 1 Shots of liquor per week  . Drug use: No  . Sexual  activity: No   Other Topics Concern  . Not on file   Social History Narrative   Married. 5 children. 4 grandkids. 1 greatgrandkid.       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       Hobbies: woodworking      Advised to consider advanced directives/hcpoa-thinks wife may have 57 from years ago.    Family History  Problem Relation Age of Onset  . Heart attack Mother 83       former smoker  . Heart attack Father 97       former smoker  . Hypertension Sister   . Heart attack Brother 26       Rheumatic fever    Review of Systems:  As stated in the HPI and otherwise negative.   BP 130/68   Pulse 73   Ht 6\' 1"  (1.854 m)   Wt 239 lb (108.4 kg)   SpO2 96%   BMI 31.53 kg/m   Physical Examination: General: Well developed, well nourished, NAD  HEENT: OP clear, mucus membranes moist  SKIN: warm,  dry. No rashes. Neuro: No focal deficits  Musculoskeletal: Muscle strength 5/5 all ext  Psychiatric: Mood and affect normal  Neck: No JVD, no carotid bruits, no thyromegaly, no lymphadenopathy.  Lungs:Clear bilaterally, no wheezes, rhonci, crackles Cardiovascular: Regular rate and rhythm. Loud harsh systolic murmur.  Abdomen:Soft. Bowel sounds present. Non-tender.  Extremities: No lower extremity edema. Pulses are 2 + in the bilateral DP/PT.   Echo May 2018 Left ventricle: The cavity size was normal. Wall thickness was   increased in a pattern of severe LVH. Systolic function was   normal. The estimated ejection fraction was in the range of 60%   to 65%. Wall motion was normal; there were no regional wall   motion abnormalities. Doppler parameters are consistent with   abnormal left ventricular relaxation (grade 1 diastolic   dysfunction). Doppler parameters are consistent with high   ventricular filling pressure. - Aortic valve: Valve mobility was restricted. There was moderate   stenosis. There was mild regurgitation. Mean gradient (S): 33 mm   Hg. Peak gradient (S): 65 mm Hg. - Aortic root: The aortic root was moderately dilated. - Ascending aorta: The ascending aorta was mildly dilated. - Left atrium: The atrium was mildly dilated. - Right ventricle: The cavity size was mildly dilated.  Impressions:  - Normal LV systolic function; mild diastolic dysfunction with   elevated LV filling pressure; severe LVH; calcified aortic valve   with moderate AS (mean gradient 33 mmHg; mild AI; moderately   dilated aortic root (48 mm); suggest CTA or MRA to further   assess; mild LAE; mild RVE.  EKG:  EKG is ordered today. The ekg ordered today demonstrates sinus rhythm, 1st degree AV block. LAFB. LVH.   Recent Labs: 11/05/2015: ALT 21; Hemoglobin 13.5; Platelets 262 04/20/2016: BUN 19; Creat 0.92; Potassium 4.9; Sodium 139   Lipid Panel    Component Value Date/Time   CHOL 195  11/11/2013 1014   TRIG 60.0 11/11/2013 1014   TRIG 87 06/22/2006 1013   HDL 70.80 11/11/2013 1014   CHOLHDL 3 11/11/2013 1014   VLDL 12.0 11/11/2013 1014   LDLCALC 112 (H) 11/11/2013 1014   LDLDIRECT 136.2 10/25/2012 1009     Wt Readings from Last 3 Encounters:  10/27/16 239 lb (108.4 kg)  04/20/16 236 lb 6.4 oz (107.2 kg)  03/17/16 233 lb 12.8 oz (106.1 kg)     Other studies  Reviewed: Additional studies/ records that were reviewed today include: . Review of the above records demonstrates:   Assessment and Plan:   1. Aortic stenosis: Moderate by echo May 2018. He has mild AI. He is asymptomatic at this time. I have reviewed the TAVR procedure in detail. He will be seen in 6 months.   2. HTN: BP is controlled. No changes today.     3. Chronic diastolic CHF: Volume status is ok. He is on daily Lasix.   Current medicines are reviewed at length with the patient today.  The patient does not have concerns regarding medicines.  The following changes have been made:  no change  Labs/ tests ordered today include:   Orders Placed This Encounter  Procedures  . EKG 12-Lead     Disposition:   FU with me in 6 months   Signed, Lauree Chandler, MD 10/27/2016 1:25 PM    [facility] [place], [place], [place]  [address] Phone: [phone]; Fax: [phone]

## 2016-10-27 NOTE — Patient Instructions (Signed)

## 2016-11-23 DIAGNOSIS — Z961 Presence of intraocular lens: Secondary | ICD-10-CM | POA: Diagnosis not present

## 2016-11-23 DIAGNOSIS — H02839 Dermatochalasis of unspecified eye, unspecified eyelid: Secondary | ICD-10-CM | POA: Diagnosis not present

## 2016-11-23 DIAGNOSIS — H18413 Arcus senilis, bilateral: Secondary | ICD-10-CM | POA: Diagnosis not present

## 2016-11-23 DIAGNOSIS — I10 Essential (primary) hypertension: Secondary | ICD-10-CM | POA: Diagnosis not present

## 2017-02-03 ENCOUNTER — Other Ambulatory Visit: Payer: Self-pay | Admitting: Cardiovascular Disease

## 2017-02-03 ENCOUNTER — Other Ambulatory Visit: Payer: Self-pay | Admitting: Family Medicine

## 2017-02-12 ENCOUNTER — Other Ambulatory Visit: Payer: Self-pay | Admitting: Cardiovascular Disease

## 2017-02-12 MED ORDER — FUROSEMIDE 20 MG PO TABS: 20.0000 mg | ORAL_TABLET | Freq: Every day | ORAL | 2 refills | Status: DC

## 2017-03-19 DIAGNOSIS — N401 Enlarged prostate with lower urinary tract symptoms: Secondary | ICD-10-CM | POA: Diagnosis not present

## 2017-03-19 DIAGNOSIS — R351 Nocturia: Secondary | ICD-10-CM | POA: Diagnosis not present

## 2017-03-19 DIAGNOSIS — Z87440 Personal history of urinary (tract) infections: Secondary | ICD-10-CM | POA: Diagnosis not present

## 2017-03-19 DIAGNOSIS — N200 Calculus of kidney: Secondary | ICD-10-CM | POA: Diagnosis not present

## 2017-03-30 ENCOUNTER — Encounter: Payer: Self-pay | Admitting: Family Medicine

## 2017-03-30 ENCOUNTER — Ambulatory Visit (INDEPENDENT_AMBULATORY_CARE_PROVIDER_SITE_OTHER): Payer: PPO | Admitting: Family Medicine

## 2017-03-30 VITALS — BP 128/62 | HR 67 | Temp 98.0°F | Ht 72.0 in | Wt 239.4 lb

## 2017-03-30 DIAGNOSIS — Z0000 Encounter for general adult medical examination without abnormal findings: Secondary | ICD-10-CM | POA: Diagnosis not present

## 2017-03-30 DIAGNOSIS — E7800 Pure hypercholesterolemia, unspecified: Secondary | ICD-10-CM | POA: Diagnosis not present

## 2017-03-30 DIAGNOSIS — I10 Essential (primary) hypertension: Secondary | ICD-10-CM | POA: Diagnosis not present

## 2017-03-30 DIAGNOSIS — I5032 Chronic diastolic (congestive) heart failure: Secondary | ICD-10-CM

## 2017-03-30 DIAGNOSIS — I060 Rheumatic aortic stenosis: Secondary | ICD-10-CM

## 2017-03-30 DIAGNOSIS — M1A09X0 Idiopathic chronic gout, multiple sites, without tophus (tophi): Secondary | ICD-10-CM

## 2017-03-30 LAB — LIPID PANEL
Cholesterol: 208 mg/dL — ABNORMAL HIGH (ref 0–200)
HDL: 63 mg/dL (ref >39.00)
LDL Cholesterol: 121 mg/dL — ABNORMAL HIGH (ref 0–99)
NonHDL: 145.26
Total CHOL/HDL Ratio: 3
Triglycerides: 119 mg/dL (ref 0.0–149.0)
VLDL: 23.8 mg/dL (ref 0.0–40.0)

## 2017-03-30 LAB — COMPREHENSIVE METABOLIC PANEL
ALT: 16 U/L (ref 0–53)
AST: 14 U/L (ref 0–37)
Albumin: 3.8 g/dL (ref 3.5–5.2)
Alkaline Phosphatase: 63 U/L (ref 39–117)
BUN: 17 mg/dL (ref 6–23)
CO2: 27 mEq/L (ref 19–32)
Calcium: 8.7 mg/dL (ref 8.4–10.5)
Chloride: 105 mEq/L (ref 96–112)
Creatinine, Ser: 0.96 mg/dL (ref 0.40–1.50)
GFR: 79.66 mL/min (ref >60.00)
Glucose, Bld: 90 mg/dL (ref 70–99)
Potassium: 4.4 mEq/L (ref 3.5–5.1)
Sodium: 140 mEq/L (ref 135–145)
Total Bilirubin: 0.5 mg/dL (ref 0.2–1.2)
Total Protein: 6.8 g/dL (ref 6.0–8.3)

## 2017-03-30 LAB — CBC
HCT: 41.9 % (ref 39.0–52.0)
Hemoglobin: 14 g/dL (ref 13.0–17.0)
MCHC: 33.3 g/dL (ref 30.0–36.0)
MCV: 104.6 fl — ABNORMAL HIGH (ref 78.0–100.0)
Platelets: 239 10*3/uL (ref 150.0–400.0)
RBC: 4.01 Mil/uL — ABNORMAL LOW (ref 4.22–5.81)
RDW: 14.6 % (ref 11.5–15.5)
WBC: 4.5 10*3/uL (ref 4.0–10.5)

## 2017-03-30 LAB — URIC ACID: Uric Acid, Serum: 5.2 mg/dL (ref 4.0–7.8)

## 2017-03-30 NOTE — Progress Notes (Signed)
Phone: [phone]  Subjective:  Patient presents today for their annual physical. Chief complaint-noted.   See problem oriented charting- ROS- full  review of systems was completed and negative including No chest pain or shortness of breath. No headache or blurry vision. No presyncope.   The following were reviewed and entered/updated in epic: Past Medical History:  Diagnosis Date  . Aortic valve disease   . Arthritis    "spinal" (11/16/2015)  . Colon cancer ([place])    precancer-partial colectomy  . Elevated HDL   . Gout   . Heart murmur   . Hypertension   . Kidney stones   . Squamous cell skin cancer    Patient Active Problem List   Diagnosis Date Noted  . Aortic stenosis 01/31/2007    Priority: High  . Chronic diastolic CHF (congestive heart failure) ([place]) 03/30/2017    Priority: Medium  . HYPERCHOLESTEROLEMIA WITH HIGH HDL 01/31/2007    Priority: Medium  . Gout 01/31/2007    Priority: Medium  . Essential hypertension 01/31/2007    Priority: Medium  . History of malignant neoplasm of large intestine 01/31/2007    Priority: Medium  . Osteoarthritis of left knee 05/25/2014    Priority: Low  . Abdominal aortic ectasia ([place]) 04/07/2016  . Primary osteoarthritis of left knee 10/29/2015   Past Surgical History:  Procedure Laterality Date  . BACK SURGERY    . CATARACT EXTRACTION W/ INTRAOCULAR LENS  IMPLANT, BILATERAL Bilateral 2017  . COLONOSCOPY W/ POLYPECTOMY    . FACET JOINT INJECTION  2009  . LITHOTRIPSY  2010  . LUMBAR LAMINECTOMY  ~ 1992  . PARTIAL COLECTOMY  1997   precancerours  . PARTIAL KNEE ARTHROPLASTY Left 11/16/2015   Procedure: LEFT KNEE UNICOMPARTMENTAL ;  Surgeon: [name], MD;  Location: [facility];  Service: Orthopedics;  Laterality: Left;  . REPLACEMENT UNICONDYLAR JOINT KNEE Left 11/16/2015  . TONSILLECTOMY    . TYMPANOPLASTY Left 1998   ruptured ear drum with skin graft    Family History  Problem Relation Age of Onset  . Heart attack  Mother 61       former smoker  . Heart attack Father 18       former smoker  . Hypertension Sister   . Heart attack Brother 26       Rheumatic fever    Medications- reviewed and updated Current Outpatient Prescriptions  Medication Sig Dispense Refill  . allopurinol (ZYLOPRIM) 300 MG tablet Take 1 tablet by mouth once daily 90 tablet 0  . amLODipine (NORVASC) 10 MG tablet Take 1 tablet by mouth daily 90 tablet 2  . aspirin 325 MG tablet Take 162.5 mg by mouth daily.    . furosemide (LASIX) 20 MG tablet Take 1 tablet (20 mg total) by mouth daily. 90 tablet 2  . IRON PO Take 65 mg by mouth.    . potassium citrate (UROCIT-K) 10 MEQ (1080 MG) SR tablet Take 1 tablet (10 mEq total) by mouth 3 (three) times daily with meals. 300 tablet 1  . quinapril (ACCUPRIL) 40 MG tablet Take 1 tablet by mouth daily 90 tablet 2   No current facility-administered medications for this visit.     Allergies-reviewed and updated No Known Allergies  Social History   Social History  . Marital status: Married    Spouse name: N/A  . Number of children: 5  . Years of education: N/A   Occupational History  . Retired-labor Control and instrumentation engineer    Social History Main  Topics  . Smoking status: Former Smoker    Packs/day: 1.00    Years: 20.00    Types: Cigarettes    Quit date: 09/15/1980  . Smokeless tobacco: Never Used  . Alcohol use 7.2 oz/week    10 Standard drinks or equivalent, 1 Glasses of wine, 1 Shots of liquor per week  . Drug use: No  . Sexual activity: No   Other Topics Concern  . None   Social History Narrative   Married. 5 children. 4 grandkids. 1 greatgrandkid.       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       Hobbies: woodworking      Advised to consider advanced directives/hcpoa-thinks wife may have 38 from years ago.    Objective: BP 128/62 (BP Location: Left Arm, Patient Position: Sitting, Cuff Size: Large)   Pulse 67   Temp 98 F (36.7 C) (Oral)   Ht 6'  (1.829 m)   Wt 239 lb 6.4 oz (108.6 kg)   SpO2 94%   BMI 32.47 kg/m  Gen: NAD, resting comfortably, appears younger than stated age HEENT: Mucous membranes are moist. Oropharynx normal Neck: no thyromegaly CV: RRR no murmurs rubs or gallops Lungs: CTAB no crackles, wheeze, rhonchi Abdomen: soft/nontender/nondistended/normal bowel sounds. No rebound or guarding.  Ext: no edema Skin: warm, dry Neuro: grossly normal, moves all extremities, PERRLA  Assessment/Plan:  83 y.o. male presenting for annual physical.  Health Maintenance counseling: 1. Anticipatory guidance: Patient counseled regarding regular dental exams -q6 months, eye exams - yearly, wearing seatbelts.  2. Risk factor reduction:  Advised patient of need for regular exercise and diet rich and fruits and vegetables to reduce risk of heart attack and stroke. Exercise- wants to start walking now that weather has cooled off. Diet-discussed 5-10 lbs weight loss over next year- he would like to get back to 220.  Wt Readings from Last 3 Encounters:  03/30/17 239 lb 6.4 oz (108.6 kg)  10/27/16 239 lb (108.4 kg)  04/20/16 236 lb 6.4 oz (107.2 kg)  3. Immunizations/screenings/ancillary studies- discussed shingrix at pharmacy  Immunization History  Administered Date(s) Administered  . Influenza, High Dose Seasonal PF 03/26/2014  . Influenza,inj,Quad PF,6+ Mos 03/14/2013  . Influenza-Unspecified 04/06/2015, 03/02/2016, 02/28/2017  . Pneumococcal Conjugate-13 05/25/2014  . Pneumococcal Polysaccharide-23 06/19/2004  . Td 12/27/2007  . Zoster 06/19/2008  4. Prostate cancer screening-  passed age based screening guidelines. Sees urology for kidney stones Dr. Jeffie Pollock Lab Results  Component Value Date   PSA 1.36 10/04/2009   PSA 1.19 12/27/2007   PSA 0.80 06/22/2006   5. Colon cancer screening - Dr. Loletha Carrow sent letter 08/17/16 to patient stating no more colonoscopies. Hyperplastic polyp only in 2011 6. Skin cancer screening- Dr. Denna Haggard  yearly. advised regular sunscreen use. Denies worrisome, changing, or new skin lesions.    Status of chronic or acute concerns   Gout- on allopurinol 300mg . No issues lately  HTN- home #s have been controlled +chronic diastolic CHF- on amlodipine 10mg , lasix 20mg , quinapril 40mg  with potassium due to lasix  Aortic stenosis- follows with cardiology Dr. Angelena Form every 6 months. Last echo may 2018 was moderate for aortic stenosis. He is asymptomatic  OA - Right knee injection about a year ago- has done well since then. Has seen ortho in past- could consider viscous supplementation in future  HLD- on aspirin alone. Unclear benefit of statin being started or even is aspirin- has tolerated aspirin well- will continue unless GI bleed  Return  in about 6 months (around 09/28/2017) for follow up- or sooner if needed.  Orders Placed This Encounter  Procedures  . CBC    [place]  . Comprehensive metabolic panel    [facility]    Order Specific Question:   Has the patient fasted?    Answer:   No  . Lipid panel    [facility]    Order Specific Question:   Has the patient fasted?    Answer:   No  . Uric acid   Return precautions advised.  Garret Reddish, MD

## 2017-03-30 NOTE — Patient Instructions (Addendum)
Check with your pharmacist about shingrix. If you get this with them, please let us know. There are availability issues so may be sometime until its available. Far cheaper for medicare patients to get at the pharmacy.   Please stop by lab before you go  Things look great- no changes today

## 2017-04-04 ENCOUNTER — Telehealth: Payer: Self-pay | Admitting: Family Medicine

## 2017-04-04 NOTE — Telephone Encounter (Signed)
Results faxed.

## 2017-04-04 NOTE — Telephone Encounter (Signed)
Judy with [facility] called in reference to needing lab results from patient's last visit faxed over to [phone]. Please advise.

## 2017-04-21 ENCOUNTER — Other Ambulatory Visit: Payer: Self-pay | Admitting: Family Medicine

## 2017-06-01 ENCOUNTER — Encounter: Payer: Self-pay | Admitting: Family Medicine

## 2017-06-01 ENCOUNTER — Ambulatory Visit: Payer: PPO | Admitting: Family Medicine

## 2017-06-01 VITALS — BP 142/72 | HR 72 | Temp 98.2°F | Ht 72.0 in | Wt 237.2 lb

## 2017-06-01 DIAGNOSIS — H6122 Impacted cerumen, left ear: Secondary | ICD-10-CM | POA: Diagnosis not present

## 2017-06-01 DIAGNOSIS — R059 Cough, unspecified: Secondary | ICD-10-CM

## 2017-06-01 DIAGNOSIS — J029 Acute pharyngitis, unspecified: Secondary | ICD-10-CM | POA: Diagnosis not present

## 2017-06-01 DIAGNOSIS — R05 Cough: Secondary | ICD-10-CM | POA: Diagnosis not present

## 2017-06-01 MED ORDER — METHYLPREDNISOLONE ACETATE 80 MG/ML IJ SUSP
80.0000 mg | Freq: Once | INTRAMUSCULAR | Status: AC
  Administered 2017-06-01: 80 mg via INTRAMUSCULAR

## 2017-06-01 MED ORDER — BENZONATATE 200 MG PO CAPS: 200.0000 mg | ORAL_CAPSULE | Freq: Two times a day (BID) | ORAL | 0 refills | Status: DC | PRN

## 2017-06-01 MED ORDER — IPRATROPIUM BROMIDE 0.06 % NA SOLN: 2.0000 | Freq: Four times a day (QID) | NASAL | 0 refills | Status: DC

## 2017-06-01 NOTE — Patient Instructions (Signed)
Start Atrovent and Tessalon.  If not better within the next 3-4 days, please let us know.  You may need an antibiotic at that point.  Take care, Dr. Jerline Pain

## 2017-06-01 NOTE — Progress Notes (Signed)
    Subjective:  Barry Decker is a 83 y.o. male who presents today with a chief complaint of cough.   HPI:  Cough, acute issue Symptoms started 5 days ago.  Worsened over that time.  Associated symptoms include rhinorrhea, sore throat, subjective fevers and chills.  Has tried several over-the-counter medications including Mucinex which have not significantly seem to help.  His wife was sick with similar symptoms about a week ago however her symptoms have since resolved.  Symptoms worse at night.  Cough is keeping him up at night.  No shortness of breath.  No chest pain.  ROS: Per HPI  PMH: Smoking history reviewed.  Former smoker.  Objective:  Physical Exam: BP (!) 142/72 (BP Location: Left Arm, Patient Position: Sitting, Cuff Size: Normal)   Pulse 72   Temp 98.2 F (36.8 C)   Ht 6' (1.829 m)   Wt 237 lb 3.2 oz (107.6 kg)   SpO2 95%   BMI 32.17 kg/m   Gen: NAD, resting comfortably HEENT: Right TM with clear effusion.  Left EAC with impacted cerumen.  Maxillary sinuses with mildly decreased translation bilaterally.  Oropharynx erythematous without exudate.  No lymphadenopathy noted. CV: RRR with no murmurs appreciated Pulm: NWOB, CTAB with no crackles, wheezes, or rhonchi  Cerumen successfully removed by irrigation by CMA.  Assessment/Plan:  Cough No signs of bacterial infection.  Start Atrovent nasal spray.  Will also start Tessalon for cough.  Will give IM Depo-Medrol 80 mg for his URI and sore throat.  Continue Tylenol and/or Motrin as needed for low-grade fever and pain.  Encouraged good oral hydration.  Return precautions reviewed.  Follow-up as needed.  Impacted Cerumen Successfully irrigated as noted above.  Algis Greenhouse. Jerline Pain, MD 06/01/2017 1:05 PM

## 2017-06-13 DIAGNOSIS — R011 Cardiac murmur, unspecified: Secondary | ICD-10-CM | POA: Insufficient documentation

## 2017-06-13 DIAGNOSIS — N200 Calculus of kidney: Secondary | ICD-10-CM | POA: Insufficient documentation

## 2017-06-13 DIAGNOSIS — M1990 Unspecified osteoarthritis, unspecified site: Secondary | ICD-10-CM | POA: Insufficient documentation

## 2017-06-13 DIAGNOSIS — C4492 Squamous cell carcinoma of skin, unspecified: Secondary | ICD-10-CM | POA: Insufficient documentation

## 2017-06-13 DIAGNOSIS — I359 Nonrheumatic aortic valve disorder, unspecified: Secondary | ICD-10-CM | POA: Insufficient documentation

## 2017-06-13 DIAGNOSIS — E7889 Other lipoprotein metabolism disorders: Secondary | ICD-10-CM | POA: Insufficient documentation

## 2017-06-13 DIAGNOSIS — I10 Essential (primary) hypertension: Secondary | ICD-10-CM | POA: Insufficient documentation

## 2017-06-13 DIAGNOSIS — C189 Malignant neoplasm of colon, unspecified: Secondary | ICD-10-CM | POA: Insufficient documentation

## 2017-07-04 ENCOUNTER — Encounter: Payer: Self-pay | Admitting: Cardiovascular Disease

## 2017-07-04 ENCOUNTER — Ambulatory Visit: Payer: PPO | Admitting: Cardiovascular Disease

## 2017-07-04 VITALS — BP 136/70 | HR 64 | Wt 236.0 lb

## 2017-07-04 DIAGNOSIS — I350 Nonrheumatic aortic (valve) stenosis: Secondary | ICD-10-CM | POA: Diagnosis not present

## 2017-07-04 DIAGNOSIS — I5032 Chronic diastolic (congestive) heart failure: Secondary | ICD-10-CM

## 2017-07-04 DIAGNOSIS — I10 Essential (primary) hypertension: Secondary | ICD-10-CM

## 2017-07-04 NOTE — Patient Instructions (Signed)

## 2017-07-04 NOTE — Progress Notes (Signed)
Chief Complaint  Patient presents with  . Follow-up    hypertension     History of Present Illness: 84 yo male with history of HTN, HLD, aortic stenosis, aortic insufficiency here today for cardiac follow up. I saw him as a new patient August 2015. He has been known to have AS for many years. He has a history of rheumatic fever as a child with murmur noted his entire life. His aortic stenosis has been moderate. Last echo May 2018 with  normal LVEF, severe LVH, moderate AS (mean gradient 33 mmHg) mild AI, mildly dilated aortic root. Started on Lasix at last visit for mild LE edema.   He is here today for follow up. The patient denies any chest pain, palpitations, lower extremity edema, orthopnea, PND, dizziness, near syncope or syncope. He is having dyspnea with exertion. His wife and children have noticed that he is out of breath when walking across the room. Overall he is feeling ok.    Primary Care Physician: [name], MD   Past Medical History:  Diagnosis Date  . Aortic valve disease   . Arthritis    "spinal" (11/16/2015)  . Colon cancer ([place])    precancer-partial colectomy  . Elevated HDL   . Gout   . Heart murmur   . Hypertension   . Kidney stones   . Squamous cell skin cancer     Past Surgical History:  Procedure Laterality Date  . BACK SURGERY    . CATARACT EXTRACTION W/ INTRAOCULAR LENS  IMPLANT, BILATERAL Bilateral 2017  . COLONOSCOPY W/ POLYPECTOMY    . FACET JOINT INJECTION  2009  . LITHOTRIPSY  2010  . LUMBAR LAMINECTOMY  ~ 1992  . PARTIAL COLECTOMY  1997   precancerours  . PARTIAL KNEE ARTHROPLASTY Left 11/16/2015   Procedure: LEFT KNEE UNICOMPARTMENTAL ;  Surgeon: [name], MD;  Location: [facility];  Service: Orthopedics;  Laterality: Left;  . REPLACEMENT UNICONDYLAR JOINT KNEE Left 11/16/2015  . TONSILLECTOMY    . TYMPANOPLASTY Left 1998   ruptured ear drum with skin graft    Current Outpatient Medications  Medication Sig Dispense Refill  .  allopurinol (ZYLOPRIM) 300 MG tablet Take 1 tablet by mouth once daily 90 tablet 1  . amLODipine (NORVASC) 10 MG tablet Take 1 tablet by mouth daily 90 tablet 2  . aspirin 325 MG tablet Take 162.5 mg by mouth daily.    . benzonatate (TESSALON) 200 MG capsule Take 1 capsule (200 mg total) by mouth 2 (two) times daily as needed for cough. 20 capsule 0  . ipratropium (ATROVENT) 0.06 % nasal spray Place 2 sprays into both nostrils 4 (four) times daily. 15 mL 0  . IRON PO Take 65 mg by mouth.    . potassium citrate (UROCIT-K) 10 MEQ (1080 MG) SR tablet Take 1 tablet (10 mEq total) by mouth 3 (three) times daily with meals. 300 tablet 1  . quinapril (ACCUPRIL) 40 MG tablet Take 1 tablet by mouth daily 90 tablet 1  . furosemide (LASIX) 20 MG tablet Take 1 tablet (20 mg total) by mouth daily. 90 tablet 2   No current facility-administered medications for this visit.     No Known Allergies  Social History   Socioeconomic History  . Marital status: Married    Spouse name: Not on file  . Number of children: 5  . Years of education: Not on file  . Highest education level: Not on file  Social Needs  .  Financial resource strain: Not on file  . Food insecurity - worry: Not on file  . Food insecurity - inability: Not on file  . Transportation needs - medical: Not on file  . Transportation needs - non-medical: Not on file  Occupational History  . Occupation: Government social research officer  Tobacco Use  . Smoking status: Former Smoker    Packs/day: 1.00    Years: 20.00    Pack years: 20.00    Types: Cigarettes    Last attempt to quit: 09/15/1980    Years since quitting: 36.8  . Smokeless tobacco: Never Used  Substance and Sexual Activity  . Alcohol use: Yes    Alcohol/week: 7.2 oz    Types: 10 Standard drinks or equivalent, 1 Glasses of wine, 1 Shots of liquor per week  . Drug use: No  . Sexual activity: No  Other Topics Concern  . Not on file  Social History Narrative   Married. 5 children.  4 grandkids. 1 greatgrandkid.       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       Hobbies: woodworking      Advised to consider advanced directives/hcpoa-thinks wife may have 4 from years ago.    Family History  Problem Relation Age of Onset  . Heart attack Mother 67       former smoker  . Heart attack Father 40       former smoker  . Hypertension Sister   . Heart attack Brother 26       Rheumatic fever    Review of Systems:  As stated in the HPI and otherwise negative.   BP 136/70   Pulse 64   Wt 236 lb (107 kg)   SpO2 96%   BMI 32.01 kg/m   Physical Examination:  General: Well developed, well nourished, NAD  HEENT: OP clear, mucus membranes moist  SKIN: warm, dry. No rashes. Neuro: No focal deficits  Musculoskeletal: Muscle strength 5/5 all ext  Psychiatric: Mood and affect normal  Neck: No JVD, no carotid bruits, no thyromegaly, no lymphadenopathy.  Lungs:Clear bilaterally, no wheezes, rhonci, crackles Cardiovascular: Regular rate and rhythm.  Loud, harsh systolic murmur.  Abdomen:Soft. Bowel sounds present. Non-tender.  Extremities: No lower extremity edema. Pulses are 2 + in the bilateral DP/PT.  Echo May 2018 Left ventricle: The cavity size was normal. Wall thickness was   increased in a pattern of severe LVH. Systolic function was   normal. The estimated ejection fraction was in the range of 60%   to 65%. Wall motion was normal; there were no regional wall   motion abnormalities. Doppler parameters are consistent with   abnormal left ventricular relaxation (grade 1 diastolic   dysfunction). Doppler parameters are consistent with high   ventricular filling pressure. - Aortic valve: Valve mobility was restricted. There was moderate   stenosis. There was mild regurgitation. Mean gradient (S): 33 mm   Hg. Peak gradient (S): 65 mm Hg. - Aortic root: The aortic root was moderately dilated. - Ascending aorta: The ascending aorta was mildly  dilated. - Left atrium: The atrium was mildly dilated. - Right ventricle: The cavity size was mildly dilated.  Impressions:  - Normal LV systolic function; mild diastolic dysfunction with   elevated LV filling pressure; severe LVH; calcified aortic valve   with moderate AS (mean gradient 33 mmHg; mild AI; moderately   dilated aortic root (48 mm); suggest CTA or MRA to further   assess; mild LAE; mild RVE.  EKG:  EKG is  ordered today. The ekg ordered today demonstrates Sinus, first degree AV block. Rate 64 bpm. RBBB. LAFB.   Recent Labs: 03/30/2017: ALT 16; BUN 17; Creatinine, Ser 0.96; Hemoglobin 14.0; Platelets 239.0; Potassium 4.4; Sodium 140   Lipid Panel    Component Value Date/Time   CHOL 208 (H) 03/30/2017 0854   TRIG 119.0 03/30/2017 0854   TRIG 87 06/22/2006 1013   HDL 63.00 03/30/2017 0854   CHOLHDL 3 03/30/2017 0854   VLDL 23.8 03/30/2017 0854   LDLCALC 121 (H) 03/30/2017 0854   LDLDIRECT 136.2 10/25/2012 1009     Wt Readings from Last 3 Encounters:  07/04/17 236 lb (107 kg)  06/01/17 237 lb 3.2 oz (107.6 kg)  03/30/17 239 lb 6.4 oz (108.6 kg)     Other studies Reviewed: Additional studies/ records that were reviewed today include: . Review of the above records demonstrates:   Assessment and Plan:   1. Aortic stenosis: He is known to have moderate AS by most recent echo May 2018. There is mild AI. He has been having more dyspnea. His wife and children have noticed this. He has no symptoms. I will repeat an echo now.  I have reviewed TAVR again. If his AS has progressed, will need cardiac cath and workup for TAVR.   2. HTN: BP is well controlled. No changes.     3. Chronic diastolic CHF: Weight is stable. Continue Lasix.   Current medicines are reviewed at length with the patient today.  The patient does not have concerns regarding medicines.  The following changes have been made:  no change  Labs/ tests ordered today include:   Orders Placed This  Encounter  Procedures  . EKG 12-Lead  . ECHOCARDIOGRAM COMPLETE     Disposition:   FU with me in 6 months   Signed, Lauree Chandler, MD 07/04/2017 12:00 PM    [facility], [place], [place]  [address] Phone: [phone]; Fax: [phone]

## 2017-07-12 ENCOUNTER — Telehealth: Payer: Self-pay | Admitting: *Deleted

## 2017-07-12 ENCOUNTER — Other Ambulatory Visit: Payer: Self-pay

## 2017-07-12 ENCOUNTER — Ambulatory Visit (HOSPITAL_COMMUNITY): Payer: PPO | Attending: Cardiology

## 2017-07-12 DIAGNOSIS — I351 Nonrheumatic aortic (valve) insufficiency: Secondary | ICD-10-CM | POA: Insufficient documentation

## 2017-07-12 DIAGNOSIS — E785 Hyperlipidemia, unspecified: Secondary | ICD-10-CM | POA: Diagnosis not present

## 2017-07-12 DIAGNOSIS — Z8249 Family history of ischemic heart disease and other diseases of the circulatory system: Secondary | ICD-10-CM | POA: Insufficient documentation

## 2017-07-12 DIAGNOSIS — Z01812 Encounter for preprocedural laboratory examination: Secondary | ICD-10-CM

## 2017-07-12 DIAGNOSIS — I272 Pulmonary hypertension, unspecified: Secondary | ICD-10-CM | POA: Insufficient documentation

## 2017-07-12 DIAGNOSIS — I5032 Chronic diastolic (congestive) heart failure: Secondary | ICD-10-CM

## 2017-07-12 DIAGNOSIS — I350 Nonrheumatic aortic (valve) stenosis: Secondary | ICD-10-CM | POA: Diagnosis not present

## 2017-07-12 DIAGNOSIS — I77810 Thoracic aortic ectasia: Secondary | ICD-10-CM

## 2017-07-12 DIAGNOSIS — Z87891 Personal history of nicotine dependence: Secondary | ICD-10-CM | POA: Insufficient documentation

## 2017-07-12 DIAGNOSIS — I110 Hypertensive heart disease with heart failure: Secondary | ICD-10-CM | POA: Insufficient documentation

## 2017-07-12 NOTE — Telephone Encounter (Signed)
Left message to call back  

## 2017-07-12 NOTE — Telephone Encounter (Signed)
Pt notified it is OK to give blood as planned.

## 2017-07-12 NOTE — Telephone Encounter (Signed)
It should be ok for him to give blood. cdm

## 2017-07-12 NOTE — Telephone Encounter (Signed)
I spoke with pt and reviewed echo results with pt. He would like to proceed with CTA. He will come in for BMP on 07/16/17.   He is scheduled to give blood tomorrow (does this on a regular basis). He is feeling well.  He is asking if OK to donate blood as planned.

## 2017-07-12 NOTE — Telephone Encounter (Signed)
New message ° °Pt verbalized that she is returning call for RN °

## 2017-07-16 ENCOUNTER — Other Ambulatory Visit: Payer: PPO | Admitting: *Deleted

## 2017-07-16 DIAGNOSIS — Z01812 Encounter for preprocedural laboratory examination: Secondary | ICD-10-CM | POA: Diagnosis not present

## 2017-07-16 DIAGNOSIS — I77810 Thoracic aortic ectasia: Secondary | ICD-10-CM

## 2017-07-16 LAB — BASIC METABOLIC PANEL
BUN/Creatinine Ratio: 17 (ref 10–24)
BUN: 17 mg/dL (ref 8–27)
CO2: 26 mmol/L (ref 20–29)
Calcium: 9.2 mg/dL (ref 8.6–10.2)
Chloride: 99 mmol/L (ref 96–106)
Creatinine, Ser: 0.98 mg/dL (ref 0.76–1.27)
GFR calc Af Amer: 83 mL/min/{1.73_m2} (ref >59)
GFR calc non Af Amer: 72 mL/min/{1.73_m2} (ref >59)
Glucose: 112 mg/dL — ABNORMAL HIGH (ref 65–99)
Potassium: 4.4 mmol/L (ref 3.5–5.2)
Sodium: 141 mmol/L (ref 134–144)

## 2017-07-23 ENCOUNTER — Ambulatory Visit (INDEPENDENT_AMBULATORY_CARE_PROVIDER_SITE_OTHER)
Admission: RE | Admit: 2017-07-23 | Discharge: 2017-07-23 | Disposition: A | Discharge status: Home / Self Care | Payer: PPO | Source: Ambulatory Visit | Attending: Cardiovascular Disease | Admitting: Cardiovascular Disease

## 2017-07-23 DIAGNOSIS — I77810 Thoracic aortic ectasia: Secondary | ICD-10-CM

## 2017-07-23 DIAGNOSIS — R0600 Dyspnea, unspecified: Secondary | ICD-10-CM | POA: Diagnosis not present

## 2017-07-23 MED ORDER — IOPAMIDOL (ISOVUE-370) INJECTION 76%
100.0000 mL | Freq: Once | INTRAVENOUS | Status: AC | PRN
  Administered 2017-07-23: 100 mL via INTRAVENOUS

## 2017-07-25 ENCOUNTER — Telehealth: Payer: Self-pay | Admitting: Cardiovascular Disease

## 2017-07-25 DIAGNOSIS — I77810 Thoracic aortic ectasia: Secondary | ICD-10-CM

## 2017-07-25 NOTE — Telephone Encounter (Signed)
I spoke with pt and reviewed CT results with him. Will make referral to Dr. Cyndia Bent Will arrange follow up CTA and next office visit with Dr. Angelena Form

## 2017-07-25 NOTE — Telephone Encounter (Signed)
New message    Patient calling for ct results.

## 2017-08-01 ENCOUNTER — Telehealth: Payer: Self-pay | Admitting: Cardiovascular Disease

## 2017-08-01 DIAGNOSIS — D229 Melanocytic nevi, unspecified: Secondary | ICD-10-CM | POA: Diagnosis not present

## 2017-08-01 DIAGNOSIS — L309 Dermatitis, unspecified: Secondary | ICD-10-CM | POA: Diagnosis not present

## 2017-08-01 NOTE — Telephone Encounter (Signed)
I spoke with pt's wife.  She voices concern regarding shortness of breath and recent findings on CT Scan.  I also spoke with pt and scheduled him to see Dr. Angelena Form on February 18,2019 at 3:20

## 2017-08-01 NOTE — Telephone Encounter (Signed)
New message   Wife calling seen on 2/4 and CT done .     Referral to Dr. Cyndia Bent will be on 3/6 should this be move up sooner concern of SOB.    Pt c/o Shortness Of Breath: STAT if SOB developed within the last 24 hours or pt is noticeably SOB on the phone  1. Are you currently SOB (can you hear that pt is SOB on the phone)? Wife states Dr. Angelena Form is aware  2. How long have you been experiencing SOB? Wife-  before Thanksgiving of last year  3. Are you SOB when sitting or when up moving around? Wife- moving around   4. Are you currently experiencing any other symptoms? Wife-just tired and falling asleep in chair.

## 2017-08-01 NOTE — Telephone Encounter (Signed)
He has moderate AS. He has an enlarged aortic root. I can see him back next week and he will probably need a cath. cdm

## 2017-08-06 ENCOUNTER — Encounter: Payer: Self-pay | Admitting: Cardiovascular Disease

## 2017-08-06 ENCOUNTER — Ambulatory Visit (INDEPENDENT_AMBULATORY_CARE_PROVIDER_SITE_OTHER): Payer: PPO | Admitting: Cardiovascular Disease

## 2017-08-06 VITALS — BP 142/64 | HR 74 | Ht 72.0 in | Wt 237.0 lb

## 2017-08-06 DIAGNOSIS — I350 Nonrheumatic aortic (valve) stenosis: Secondary | ICD-10-CM

## 2017-08-06 DIAGNOSIS — I77810 Thoracic aortic ectasia: Secondary | ICD-10-CM | POA: Diagnosis not present

## 2017-08-06 DIAGNOSIS — R0602 Shortness of breath: Secondary | ICD-10-CM | POA: Diagnosis not present

## 2017-08-06 DIAGNOSIS — I10 Essential (primary) hypertension: Secondary | ICD-10-CM | POA: Diagnosis not present

## 2017-08-06 DIAGNOSIS — I5032 Chronic diastolic (congestive) heart failure: Secondary | ICD-10-CM | POA: Diagnosis not present

## 2017-08-06 NOTE — Progress Notes (Signed)
Chief Complaint  Patient presents with  . Follow-up    HTN   History of Present Illness: 84 yo male with history of HTN, HLD, aortic stenosis, aortic insufficiency here today for cardiac follow up. I saw him as a new patient August 2015. He has been known to have AS for many years. He has a history of rheumatic fever as a child with murmur noted his entire life. His aortic stenosis has been moderate. Echo in May 2018 showed normal LVEF, severe LVH, moderate AS (mean gradient 33 mmHg) mild AI, mildly dilated aortic root. I saw him 07/04/17 and he c/o worsened dyspnea. Echo 07/12/17 showed normal LV systolic function with ZYSA=63-01%. His aortic valve was thickened and calcified with DVI of 0.31, mean gradient 38 mmHg and AVA estimated over 1.5 cm2. His aortic root was enlarged. Chest CTA 07/23/17 with dilated ascending thoracic aorta with proximal to mid ascending thoracic aorta up to 4.6 cm. Sinus of Valsalva with transverse dimension of 4.8 cm.   He is here today for follow up. The patient denies any chest pain, palpitations, lower extremity edema, orthopnea, PND, dizziness, near syncope or syncope. He continues to have dyspnea with minimal exertion. Weight is stable.    Primary Care Physician: [name], MD  Past Medical History:  Diagnosis Date  . Aortic valve disease   . Arthritis    "spinal" (11/16/2015)  . Colon cancer ([place])    precancer-partial colectomy  . Elevated HDL   . Gout   . Heart murmur   . Hypertension   . Kidney stones   . Squamous cell skin cancer     Past Surgical History:  Procedure Laterality Date  . BACK SURGERY    . CATARACT EXTRACTION W/ INTRAOCULAR LENS  IMPLANT, BILATERAL Bilateral 2017  . COLONOSCOPY W/ POLYPECTOMY    . FACET JOINT INJECTION  2009  . LITHOTRIPSY  2010  . LUMBAR LAMINECTOMY  ~ 1992  . PARTIAL COLECTOMY  1997   precancerours  . PARTIAL KNEE ARTHROPLASTY Left 11/16/2015   Procedure: LEFT KNEE UNICOMPARTMENTAL ;  Surgeon: [name], MD;  Location: [facility];  Service: Orthopedics;  Laterality: Left;  . REPLACEMENT UNICONDYLAR JOINT KNEE Left 11/16/2015  . TONSILLECTOMY    . TYMPANOPLASTY Left 1998   ruptured ear drum with skin graft    Current Outpatient Medications  Medication Sig Dispense Refill  . allopurinol (ZYLOPRIM) 300 MG tablet Take 1 tablet by mouth once daily 90 tablet 1  . amLODipine (NORVASC) 10 MG tablet Take 1 tablet by mouth daily 90 tablet 2  . aspirin 325 MG tablet Take 162.5 mg by mouth daily.    . benzonatate (TESSALON) 200 MG capsule Take 1 capsule (200 mg total) by mouth 2 (two) times daily as needed for cough. 20 capsule 0  . ipratropium (ATROVENT) 0.06 % nasal spray Place 2 sprays into both nostrils 4 (four) times daily. 15 mL 0  . IRON PO Take 65 mg by mouth.    . potassium citrate (UROCIT-K) 10 MEQ (1080 MG) SR tablet Take 1 tablet (10 mEq total) by mouth 3 (three) times daily with meals. 300 tablet 1  . quinapril (ACCUPRIL) 40 MG tablet Take 1 tablet by mouth daily 90 tablet 1  . furosemide (LASIX) 20 MG tablet Take 1 tablet (20 mg total) by mouth daily. 90 tablet 2   No current facility-administered medications for this visit.     No Known Allergies  Social History  Socioeconomic History  . Marital status: Married    Spouse name: Not on file  . Number of children: 5  . Years of education: Not on file  . Highest education level: Not on file  Social Needs  . Financial resource strain: Not on file  . Food insecurity - worry: Not on file  . Food insecurity - inability: Not on file  . Transportation needs - medical: Not on file  . Transportation needs - non-medical: Not on file  Occupational History  . Occupation: Government social research officer  Tobacco Use  . Smoking status: Former Smoker    Packs/day: 1.00    Years: 20.00    Pack years: 20.00    Types: Cigarettes    Last attempt to quit: 09/15/1980    Years since quitting: 36.9  . Smokeless tobacco: Never Used  Substance and  Sexual Activity  . Alcohol use: Yes    Alcohol/week: 7.2 oz    Types: 10 Standard drinks or equivalent, 1 Glasses of wine, 1 Shots of liquor per week  . Drug use: No  . Sexual activity: No  Other Topics Concern  . Not on file  Social History Narrative   Married. 5 children. 4 grandkids. 1 greatgrandkid.       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       Hobbies: woodworking      Advised to consider advanced directives/hcpoa-thinks wife may have 81 from years ago.    Family History  Problem Relation Age of Onset  . Heart attack Mother 47       former smoker  . Heart attack Father 50       former smoker  . Hypertension Sister   . Heart attack Brother 26       Rheumatic fever    Review of Systems:  As stated in the HPI and otherwise negative.   BP (!) 142/64   Pulse 74   Ht 6' (1.829 m)   Wt 237 lb (107.5 kg)   SpO2 94%   BMI 32.14 kg/m   Physical Examination:  General: Well developed, well nourished, NAD  HEENT: OP clear, mucus membranes moist  SKIN: warm, dry. No rashes. Neuro: No focal deficits  Musculoskeletal: Muscle strength 5/5 all ext  Psychiatric: Mood and affect normal  Neck: No JVD, no carotid bruits, no thyromegaly, no lymphadenopathy.  Lungs:Clear bilaterally, no wheezes, rhonci, crackles Cardiovascular: Regular rate and rhythm. Loud harsh. Late peaking systolic murmur.  Abdomen:Soft. Bowel sounds present. Non-tender.  Extremities: No lower extremity edema. Pulses are 2 + in the bilateral DP/PT.  Echo 07/12/17:  - Left ventricle: The cavity size was mildly dilated. There was   moderate concentric hypertrophy. Systolic function was vigorous.   The estimated ejection fraction was in the range of 65% to 70%.   Wall motion was normal; there were no regional wall motion   abnormalities. There was an increased relative contribution of   atrial contraction to ventricular filling. Doppler parameters are   consistent with abnormal left  ventricular relaxation (grade 1   diastolic dysfunction). Doppler parameters are consistent with   high ventricular filling pressure. - Aortic valve: Severely calcified annulus. Trileaflet; moderately   thickened, moderately calcified leaflets. Valve mobility was   mildly restricted. There was moderate stenosis. There was mild   regurgitation. Mean gradient (S): 33 mm Hg. Valve area (VTI):   1.77 cm^2. Valve area (Vmax): 1.86 cm^2. Valve area (Vmean): 1.78   cm^2. - Aorta: Aortic root dimension:  49 mm (ED). Ascending aortic   diameter: 49 mm (S). - Aortic root: The aortic root was moderately dilated. - Ascending aorta: The ascending aorta was moderately dilated. - Left atrium: The atrium was mildly dilated. - Pulmonary arteries: PA peak pressure: 41 mm Hg (S). - Impressions: Moderate LVH, mildly dilated LV, mild LAE, normal   LVH EF 60-65%, calcified AV with reduced leaflet mobility and   moderate AS and mild AI. There is moderate dilatation of the   aortic root at the sinus of Vlalsalva and moderately dilated   ascending aorta. Consider chest CT angio or chest MRI/MRA for   further evaluation. COmpared to prior echo, the mean AV gradient   has not changed but the ascending aorta and aortic root diameters   have increased (aortic root 48>80mm and ascending aorta 44>48mm).  Impressions:  - Moderate LVH, mildly dilated LV, mild LAE, normal LVH EF 60-65%,   calcified AV with reduced leaflet mobility and moderate AS and   mild AI. There is moderate dilatation of the aortic root at the   sinus of Vlalsalva and moderately dilated ascending aorta.   Consider chest CT angio or chest MRI/MRA for further evaluation.   COmpared to prior echo, the mean AV gradient has not changed but   the ascending aorta and aortic root diameters have increased   (aortic root 48>75mm and ascending aorta 44>65mm). The right   ventricular systolic pressure was increased consistent with mild   pulmonary  hypertension.  CTA chest 07/23/17:  Calcified aortic valve. Dilated ascending thoracic aorta with proximal to mid ascending thoracic aorta measuring up to 4.6 cm. Sinus of Valsalva, transverse dimension of 4.8 cm. Sinotubular junction, transverse dimension of 4.1 cm. Aortic arch transverse dimension of 3.7 cm. Descending thoracic aorta measures up to 3.5 cm.  Coronary artery calcifications.  6 mm pleural based nodule right lower lobe (series 6, image 62). Inferior left lower lobe mild nodular parenchymal changes measuring up to 8 x 6 mm (series 6, image 69). Non-contrast chest CT at 3-6 months is recommended. If the nodules are stable at time of repeat CT, then future CT at 18-24 months (from today's scan) is considered optional for low-risk patients, but is recommended for high-risk patients. This recommendation follows the consensus statement: Guidelines for Management of Incidental Pulmonary Nodules Detected on CT Images: From the Fleischner Society 2017; Radiology 2017; 284:228-243.  Ankylosis thoracic spine.  Aortic Atherosclerosis (ICD10-I70.0).  Emphysema (ICD10-J43.9).  EKG:  EKG is not ordered today. The ekg ordered today demonstrates   Recent Labs: 03/30/2017: ALT 16; Hemoglobin 14.0; Platelets 239.0 07/16/2017: BUN 17; Creatinine, Ser 0.98; Potassium 4.4; Sodium 141   Lipid Panel    Component Value Date/Time   CHOL 208 (H) 03/30/2017 0854   TRIG 119.0 03/30/2017 0854   TRIG 87 06/22/2006 1013   HDL 63.00 03/30/2017 0854   CHOLHDL 3 03/30/2017 0854   VLDL 23.8 03/30/2017 0854   LDLCALC 121 (H) 03/30/2017 0854   LDLDIRECT 136.2 10/25/2012 1009     Wt Readings from Last 3 Encounters:  08/06/17 237 lb (107.5 kg)  07/04/17 236 lb (107 kg)  06/01/17 237 lb 3.2 oz (107.6 kg)     Other studies Reviewed: Additional studies/ records that were reviewed today include: . Review of the above records demonstrates:   Assessment and Plan:   1. Severe aortic  valve stenosis: His most recent echo is c/w severe aortic stenosis. I have personally reviewed the echo images. The aortic valve is  thickened and there is limited leaflet excursion. The mean gradient is 38 mmHg. I would consider him to be a TAVR candidate but this may be limited by his dilated aortic root. I will start by arranging a right and left heart cath on 08/10/17 at [facility] with possible PCI.  I have reviewed the risks, indications, and alternatives to cardiac catheterization, possible angioplasty, and stenting with the patient. Risks include but are not limited to bleeding, infection, vascular injury, stroke, myocardial infection, arrhythmia, kidney injury, radiation-related injury in the case of prolonged fluoroscopy use, emergency cardiac surgery, and death. The patient understands the risks of serious complication is 1-2 in 7124 with diagnostic cardiac cath and 1-2% or less with angioplasty/stenting.  He has an appointment to see Dr. Cyndia Bent. On 08/22/17. I will discuss with Dr. Cyndia Bent prior to that appt. Given his excellent functional capacity, he may be considered a candidate for an open surgical procedure if aortic root replacement is indicated in addition to the AVR.   2. HTN: BP is borderline elevated. No changes today.   3. Chronic diastolic CHF: Weight is stable. No evidence of volume overload. Continue daily Lasix.   4. Thoracic aortic aneurysm: See above. His aortic root and ascending aorta are enlarged. Will review with CT surgery this week.   Current medicines are reviewed at length with the patient today.  The patient does not have concerns regarding medicines.  The following changes have been made:  no change  Labs/ tests ordered today include:   Orders Placed This Encounter  Procedures  . Basic metabolic panel  . CBC  . Protime-INR  . EKG 12-Lead     Disposition:   New pt appt with Dr. Cyndia Bent 08/22/17 and then f/u with me in 3 months.    Signed, Lauree Chandler,  MD 08/06/2017 4:02 PM    [facility] [place], [place], [place]  [address] Phone: [phone]; Fax: [phone]

## 2017-08-06 NOTE — Patient Instructions (Addendum)
Medication Instructions:  Your physician recommends that you continue on your current medications as directed. Please refer to the Current Medication list given to you today.   Labwork: TODAY: BMET, CBC, PT/INR  Testing/Procedures: Your physician has requested that you have a cardiac catheterization on 08/10/17. Cardiac catheterization is used to diagnose and/or treat various heart conditions. Doctors may recommend this procedure for a number of different reasons. The most common reason is to evaluate chest pain. Chest pain can be a symptom of coronary artery disease (CAD), and cardiac catheterization can show whether plaque is narrowing or blocking your heart's arteries. This procedure is also used to evaluate the valves, as well as measure the blood flow and oxygen levels in different parts of your heart. For further information please visit [email or web address]. Please follow instruction sheet, as given.  Follow-Up: Your physician recommends that you schedule a follow-up appointment 2 weeks after cath with APP on Dr. Camillia Herter team    Any Other Special Instructions Will Be Listed Below (If Applicable).    [place] [address] [place] [place] [address] Dept: [phone] Loc: [phone]  Barry Decker  08/06/2017  You are scheduled for a Cardiac Catheterization on Friday, February 22 with Dr. Lauree Chandler.  1. Please arrive at the [facility] (Main Entrance A) at [facility]: [address] [place], [place] [address] at 11:30 AM (two hours before your procedure to ensure your preparation). Free valet parking service is available.   Special note: Every effort is made to have your procedure done on time. Please understand that emergencies sometimes delay scheduled procedures.  2. Diet: Do not eat or drink anything after midnight prior to your procedure except sips of  water to take medications.  3. Labs: TODAY  4. Medication instructions in preparation for your procedure:  DO NOT take your lasix the morning of your procedure  On the morning of your procedure, take your Aspirin and any other morning medicines.  You may use sips of water.  5. Plan for one night stay--bring personal belongings. 6. Bring a current list of your medications and current insurance cards. 7. You MUST have a responsible person to drive you home. 8. Someone MUST be with you the first 24 hours after you arrive home or your discharge will be delayed. 9. Please wear clothes that are easy to get on and off and wear slip-on shoes.  Thank you for allowing [place] to care for you!   --  Invasive Cardiovascular services    If you need a refill on your cardiac medications before your next appointment, please call your pharmacy.   Coronary Angiogram A coronary angiogram is an X-ray procedure that is used to examine the arteries in the heart. In this procedure, a dye (contrast dye) is injected through a long, thin tube (catheter). The catheter is inserted through the groin, wrist, or arm. The dye is injected into each artery, then X-rays are taken to show if there is a blockage in the arteries of the heart. This procedure can also show if you have valve disease or a disease of the aorta, and it can be used to check the overall function of your heart muscle. You may have a coronary angiogram if:  You are having chest pain, or other symptoms of angina, and you are at risk for heart disease.  You have an abnormal electrocardiogram (ECG) or stress test.  You  have chest pain and heart failure.  You are having irregular heart rhythms.  You and your health care provider determine that the benefits of the test information outweigh the risks of the procedure.  Let your health care provider know about:  Any allergies you have, including allergies to contrast dye.  All medicines you  are taking, including vitamins, herbs, eye drops, creams, and over-the-counter medicines.  Any problems you or family members have had with anesthetic medicines.  Any blood disorders you have.  Any surgeries you have had.  History of kidney problems or kidney failure.  Any medical conditions you have.  Whether you are pregnant or may be pregnant. What are the risks? Generally, this is a safe procedure. However, problems may occur, including:  Infection.  Allergic reaction to medicines or dyes that are used.  Bleeding from the access site or other locations.  Kidney injury, especially in people with impaired kidney function.  Stroke (rare).  Heart attack (rare).  Damage to other structures or organs.  What happens before the procedure? Staying hydrated Follow instructions from your health care provider about hydration, which may include:  Up to 2 hours before the procedure - you may continue to drink clear liquids, such as water, clear fruit juice, black coffee, and plain tea.  Eating and drinking restrictions Follow instructions from your health care provider about eating and drinking, which may include:  8 hours before the procedure - stop eating heavy meals or foods such as meat, fried foods, or fatty foods.  6 hours before the procedure - stop eating light meals or foods, such as toast or cereal.  2 hours before the procedure - stop drinking clear liquids.  General instructions  Ask your health care provider about: ? Changing or stopping your regular medicines. This is especially important if you are taking diabetes medicines or blood thinners. ? Taking medicines such as ibuprofen. These medicines can thin your blood. Do not take these medicines before your procedure if your health care provider instructs you not to, though aspirin may be recommended prior to coronary angiograms.  Plan to have someone take you home from the hospital or clinic.  You may need to  have blood tests or X-rays done. What happens during the procedure?  An IV tube will be inserted into one of your veins.  You will be given one or more of the following: ? A medicine to help you relax (sedative). ? A medicine to numb the area where the catheter will be inserted into an artery (local anesthetic).  To reduce your risk of infection: ? Your health care team will wash or sanitize their hands. ? Your skin will be washed with soap. ? Hair may be removed from the area where the catheter will be inserted.  You will be connected to a continuous ECG monitor.  The catheter will be inserted into an artery. The location may be in your groin, in your wrist, or in the fold of your arm (near your elbow).  A type of X-ray (fluoroscopy) will be used to help guide the catheter to the opening of the blood vessel that is being examined.  A dye will be injected into the catheter, and X-rays will be taken. The dye will help to show where any narrowing or blockages are located in the heart arteries.  Tell your health care provider if you have any chest pain or trouble breathing during the procedure.  If blockages are found, your health care provider  may perform another procedure, such as inserting a coronary stent. The procedure may vary among health care providers and hospitals. What happens after the procedure?  After the procedure, you will need to keep the area still for a few hours, or for as long as told by your health care provider. If the procedure is done through the groin, you will be instructed to not bend and not cross your legs.  The insertion site will be checked frequently.  The pulse in your foot or wrist will be checked frequently.  You may have additional blood tests, X-rays, and a test that records the electrical activity of your heart (ECG).  Do not drive for 24 hours if you were given a sedative. Summary  A coronary angiogram is an X-ray procedure that is used to  look into the arteries in the heart.  During the procedure, a dye (contrast dye) is injected through a long, thin tube (catheter). The catheter is inserted through the groin, wrist, or arm.  Tell your health care provider about any allergies you have, including allergies to contrast dye.  After the procedure, you will need to keep the area still for a few hours, or for as long as told by your health care provider. This information is not intended to replace advice given to you by your health care provider. Make sure you discuss any questions you have with your health care provider. Document Released: 12/10/2002 Document Revised: 03/17/2016 Document Reviewed: 03/17/2016 Elsevier Interactive Patient Education  [employer].

## 2017-08-06 NOTE — H&P (View-Only) (Signed)
Chief Complaint  Patient presents with  . Follow-up    HTN   History of Present Illness: 84 yo male with history of HTN, HLD, aortic stenosis, aortic insufficiency here today for cardiac follow up. I saw him as a new patient August 2015. He has been known to have AS for many years. He has a history of rheumatic fever as a child with murmur noted his entire life. His aortic stenosis has been moderate. Echo in May 2018 showed normal LVEF, severe LVH, moderate AS (mean gradient 33 mmHg) mild AI, mildly dilated aortic root. I saw him 07/04/17 and he c/o worsened dyspnea. Echo 07/12/17 showed normal LV systolic function with NFAO=13-08%. His aortic valve was thickened and calcified with DVI of 0.31, mean gradient 38 mmHg and AVA estimated over 1.5 cm2. His aortic root was enlarged. Chest CTA 07/23/17 with dilated ascending thoracic aorta with proximal to mid ascending thoracic aorta up to 4.6 cm. Sinus of Valsalva with transverse dimension of 4.8 cm.   He is here today for follow up. The patient denies any chest pain, palpitations, lower extremity edema, orthopnea, PND, dizziness, near syncope or syncope. He continues to have dyspnea with minimal exertion. Weight is stable.    Primary Care Physician: [name], MD  Past Medical History:  Diagnosis Date  . Aortic valve disease   . Arthritis    "spinal" (11/16/2015)  . Colon cancer ([place])    precancer-partial colectomy  . Elevated HDL   . Gout   . Heart murmur   . Hypertension   . Kidney stones   . Squamous cell skin cancer     Past Surgical History:  Procedure Laterality Date  . BACK SURGERY    . CATARACT EXTRACTION W/ INTRAOCULAR LENS  IMPLANT, BILATERAL Bilateral 2017  . COLONOSCOPY W/ POLYPECTOMY    . FACET JOINT INJECTION  2009  . LITHOTRIPSY  2010  . LUMBAR LAMINECTOMY  ~ 1992  . PARTIAL COLECTOMY  1997   precancerours  . PARTIAL KNEE ARTHROPLASTY Left 11/16/2015   Procedure: LEFT KNEE UNICOMPARTMENTAL ;  Surgeon: [name], MD;  Location: [facility];  Service: Orthopedics;  Laterality: Left;  . REPLACEMENT UNICONDYLAR JOINT KNEE Left 11/16/2015  . TONSILLECTOMY    . TYMPANOPLASTY Left 1998   ruptured ear drum with skin graft    Current Outpatient Medications  Medication Sig Dispense Refill  . allopurinol (ZYLOPRIM) 300 MG tablet Take 1 tablet by mouth once daily 90 tablet 1  . amLODipine (NORVASC) 10 MG tablet Take 1 tablet by mouth daily 90 tablet 2  . aspirin 325 MG tablet Take 162.5 mg by mouth daily.    . benzonatate (TESSALON) 200 MG capsule Take 1 capsule (200 mg total) by mouth 2 (two) times daily as needed for cough. 20 capsule 0  . ipratropium (ATROVENT) 0.06 % nasal spray Place 2 sprays into both nostrils 4 (four) times daily. 15 mL 0  . IRON PO Take 65 mg by mouth.    . potassium citrate (UROCIT-K) 10 MEQ (1080 MG) SR tablet Take 1 tablet (10 mEq total) by mouth 3 (three) times daily with meals. 300 tablet 1  . quinapril (ACCUPRIL) 40 MG tablet Take 1 tablet by mouth daily 90 tablet 1  . furosemide (LASIX) 20 MG tablet Take 1 tablet (20 mg total) by mouth daily. 90 tablet 2   No current facility-administered medications for this visit.     No Known Allergies  Social History  Socioeconomic History  . Marital status: Married    Spouse name: Not on file  . Number of children: 5  . Years of education: Not on file  . Highest education level: Not on file  Social Needs  . Financial resource strain: Not on file  . Food insecurity - worry: Not on file  . Food insecurity - inability: Not on file  . Transportation needs - medical: Not on file  . Transportation needs - non-medical: Not on file  Occupational History  . Occupation: Government social research officer  Tobacco Use  . Smoking status: Former Smoker    Packs/day: 1.00    Years: 20.00    Pack years: 20.00    Types: Cigarettes    Last attempt to quit: 09/15/1980    Years since quitting: 36.9  . Smokeless tobacco: Never Used  Substance and  Sexual Activity  . Alcohol use: Yes    Alcohol/week: 7.2 oz    Types: 10 Standard drinks or equivalent, 1 Glasses of wine, 1 Shots of liquor per week  . Drug use: No  . Sexual activity: No  Other Topics Concern  . Not on file  Social History Narrative   Married. 5 children. 4 grandkids. 1 greatgrandkid.       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       Hobbies: woodworking      Advised to consider advanced directives/hcpoa-thinks wife may have 72 from years ago.    Family History  Problem Relation Age of Onset  . Heart attack Mother 66       former smoker  . Heart attack Father 75       former smoker  . Hypertension Sister   . Heart attack Brother 26       Rheumatic fever    Review of Systems:  As stated in the HPI and otherwise negative.   BP (!) 142/64   Pulse 74   Ht 6' (1.829 m)   Wt 237 lb (107.5 kg)   SpO2 94%   BMI 32.14 kg/m   Physical Examination:  General: Well developed, well nourished, NAD  HEENT: OP clear, mucus membranes moist  SKIN: warm, dry. No rashes. Neuro: No focal deficits  Musculoskeletal: Muscle strength 5/5 all ext  Psychiatric: Mood and affect normal  Neck: No JVD, no carotid bruits, no thyromegaly, no lymphadenopathy.  Lungs:Clear bilaterally, no wheezes, rhonci, crackles Cardiovascular: Regular rate and rhythm. Loud harsh. Late peaking systolic murmur.  Abdomen:Soft. Bowel sounds present. Non-tender.  Extremities: No lower extremity edema. Pulses are 2 + in the bilateral DP/PT.  Echo 07/12/17:  - Left ventricle: The cavity size was mildly dilated. There was   moderate concentric hypertrophy. Systolic function was vigorous.   The estimated ejection fraction was in the range of 65% to 70%.   Wall motion was normal; there were no regional wall motion   abnormalities. There was an increased relative contribution of   atrial contraction to ventricular filling. Doppler parameters are   consistent with abnormal left  ventricular relaxation (grade 1   diastolic dysfunction). Doppler parameters are consistent with   high ventricular filling pressure. - Aortic valve: Severely calcified annulus. Trileaflet; moderately   thickened, moderately calcified leaflets. Valve mobility was   mildly restricted. There was moderate stenosis. There was mild   regurgitation. Mean gradient (S): 33 mm Hg. Valve area (VTI):   1.77 cm^2. Valve area (Vmax): 1.86 cm^2. Valve area (Vmean): 1.78   cm^2. - Aorta: Aortic root dimension:  49 mm (ED). Ascending aortic   diameter: 49 mm (S). - Aortic root: The aortic root was moderately dilated. - Ascending aorta: The ascending aorta was moderately dilated. - Left atrium: The atrium was mildly dilated. - Pulmonary arteries: PA peak pressure: 41 mm Hg (S). - Impressions: Moderate LVH, mildly dilated LV, mild LAE, normal   LVH EF 60-65%, calcified AV with reduced leaflet mobility and   moderate AS and mild AI. There is moderate dilatation of the   aortic root at the sinus of Vlalsalva and moderately dilated   ascending aorta. Consider chest CT angio or chest MRI/MRA for   further evaluation. COmpared to prior echo, the mean AV gradient   has not changed but the ascending aorta and aortic root diameters   have increased (aortic root 48>76mm and ascending aorta 44>30mm).  Impressions:  - Moderate LVH, mildly dilated LV, mild LAE, normal LVH EF 60-65%,   calcified AV with reduced leaflet mobility and moderate AS and   mild AI. There is moderate dilatation of the aortic root at the   sinus of Vlalsalva and moderately dilated ascending aorta.   Consider chest CT angio or chest MRI/MRA for further evaluation.   COmpared to prior echo, the mean AV gradient has not changed but   the ascending aorta and aortic root diameters have increased   (aortic root 48>66mm and ascending aorta 44>43mm). The right   ventricular systolic pressure was increased consistent with mild   pulmonary  hypertension.  CTA chest 07/23/17:  Calcified aortic valve. Dilated ascending thoracic aorta with proximal to mid ascending thoracic aorta measuring up to 4.6 cm. Sinus of Valsalva, transverse dimension of 4.8 cm. Sinotubular junction, transverse dimension of 4.1 cm. Aortic arch transverse dimension of 3.7 cm. Descending thoracic aorta measures up to 3.5 cm.  Coronary artery calcifications.  6 mm pleural based nodule right lower lobe (series 6, image 62). Inferior left lower lobe mild nodular parenchymal changes measuring up to 8 x 6 mm (series 6, image 69). Non-contrast chest CT at 3-6 months is recommended. If the nodules are stable at time of repeat CT, then future CT at 18-24 months (from today's scan) is considered optional for low-risk patients, but is recommended for high-risk patients. This recommendation follows the consensus statement: Guidelines for Management of Incidental Pulmonary Nodules Detected on CT Images: From the Fleischner Society 2017; Radiology 2017; 284:228-243.  Ankylosis thoracic spine.  Aortic Atherosclerosis (ICD10-I70.0).  Emphysema (ICD10-J43.9).  EKG:  EKG is not ordered today. The ekg ordered today demonstrates   Recent Labs: 03/30/2017: ALT 16; Hemoglobin 14.0; Platelets 239.0 07/16/2017: BUN 17; Creatinine, Ser 0.98; Potassium 4.4; Sodium 141   Lipid Panel    Component Value Date/Time   CHOL 208 (H) 03/30/2017 0854   TRIG 119.0 03/30/2017 0854   TRIG 87 06/22/2006 1013   HDL 63.00 03/30/2017 0854   CHOLHDL 3 03/30/2017 0854   VLDL 23.8 03/30/2017 0854   LDLCALC 121 (H) 03/30/2017 0854   LDLDIRECT 136.2 10/25/2012 1009     Wt Readings from Last 3 Encounters:  08/06/17 237 lb (107.5 kg)  07/04/17 236 lb (107 kg)  06/01/17 237 lb 3.2 oz (107.6 kg)     Other studies Reviewed: Additional studies/ records that were reviewed today include: . Review of the above records demonstrates:   Assessment and Plan:   1. Severe aortic  valve stenosis: His most recent echo is c/w severe aortic stenosis. I have personally reviewed the echo images. The aortic valve is  thickened and there is limited leaflet excursion. The mean gradient is 38 mmHg. I would consider him to be a TAVR candidate but this may be limited by his dilated aortic root. I will start by arranging a right and left heart cath on 08/10/17 at [facility] with possible PCI.  I have reviewed the risks, indications, and alternatives to cardiac catheterization, possible angioplasty, and stenting with the patient. Risks include but are not limited to bleeding, infection, vascular injury, stroke, myocardial infection, arrhythmia, kidney injury, radiation-related injury in the case of prolonged fluoroscopy use, emergency cardiac surgery, and death. The patient understands the risks of serious complication is 1-2 in 0762 with diagnostic cardiac cath and 1-2% or less with angioplasty/stenting.  He has an appointment to see Dr. Cyndia Bent. On 08/22/17. I will discuss with Dr. Cyndia Bent prior to that appt. Given his excellent functional capacity, he may be considered a candidate for an open surgical procedure if aortic root replacement is indicated in addition to the AVR.   2. HTN: BP is borderline elevated. No changes today.   3. Chronic diastolic CHF: Weight is stable. No evidence of volume overload. Continue daily Lasix.   4. Thoracic aortic aneurysm: See above. His aortic root and ascending aorta are enlarged. Will review with CT surgery this week.   Current medicines are reviewed at length with the patient today.  The patient does not have concerns regarding medicines.  The following changes have been made:  no change  Labs/ tests ordered today include:   Orders Placed This Encounter  Procedures  . Basic metabolic panel  . CBC  . Protime-INR  . EKG 12-Lead     Disposition:   New pt appt with Dr. Cyndia Bent 08/22/17 and then f/u with me in 3 months.    Signed, Lauree Chandler,  MD 08/06/2017 4:02 PM    [facility] [place], [place], [place]  [address] Phone: [phone]; Fax: [phone]

## 2017-08-07 LAB — CBC
Hematocrit: 40.6 % (ref 37.5–51.0)
Hemoglobin: 13.9 g/dL (ref 13.0–17.7)
MCH: 35.5 pg — ABNORMAL HIGH (ref 26.6–33.0)
MCHC: 34.2 g/dL (ref 31.5–35.7)
MCV: 104 fL — ABNORMAL HIGH (ref 79–97)
Platelets: 275 10*3/uL (ref 150–379)
RBC: 3.92 x10E6/uL — ABNORMAL LOW (ref 4.14–5.80)
RDW: 15.1 % (ref 12.3–15.4)
WBC: 6.5 10*3/uL (ref 3.4–10.8)

## 2017-08-07 LAB — BASIC METABOLIC PANEL
BUN/Creatinine Ratio: 18 (ref 10–24)
BUN: 17 mg/dL (ref 8–27)
CO2: 24 mmol/L (ref 20–29)
Calcium: 9.1 mg/dL (ref 8.6–10.2)
Chloride: 104 mmol/L (ref 96–106)
Creatinine, Ser: 0.97 mg/dL (ref 0.76–1.27)
GFR calc Af Amer: 84 mL/min/{1.73_m2} (ref >59)
GFR calc non Af Amer: 72 mL/min/{1.73_m2} (ref >59)
Glucose: 99 mg/dL (ref 65–99)
Potassium: 4.5 mmol/L (ref 3.5–5.2)
Sodium: 141 mmol/L (ref 134–144)

## 2017-08-07 LAB — PROTIME-INR
INR: 1 (ref 0.8–1.2)
Prothrombin Time: 10.4 s (ref 9.1–12.0)

## 2017-08-09 ENCOUNTER — Telehealth: Payer: Self-pay | Admitting: *Deleted

## 2017-08-09 NOTE — Telephone Encounter (Signed)
Pt contacted pre-catheterization scheduled at [facility] for: Friday February 22,2019 at 1:30 PM Verified arrival time and place: [place] at: 11:30 AM Nothing to eat or drink after midnight  Verified allergies listed in Epic. Verified no diabetes medications.  HOLD: Furosemide am of cath   Except hold medication AM meds can be  taken pre-cath with sip of water including: 1/2 of ASA 325 mg am of cath  Confirmed patient has responsible person to drive home post procedure and observe patient for 24 hours: yes

## 2017-08-10 ENCOUNTER — Ambulatory Visit (HOSPITAL_COMMUNITY)
Admission: RE | Admit: 2017-08-10 | Discharge: 2017-08-10 | Disposition: A | Discharge status: Home / Self Care | Payer: PPO | Source: Ambulatory Visit | Attending: Cardiovascular Disease | Admitting: Cardiovascular Disease

## 2017-08-10 ENCOUNTER — Encounter (HOSPITAL_COMMUNITY): Payer: Self-pay

## 2017-08-10 ENCOUNTER — Ambulatory Visit (HOSPITAL_COMMUNITY)
Admission: RE | Disposition: A | Discharge status: Home / Self Care | Payer: Self-pay | Source: Ambulatory Visit | Attending: Cardiovascular Disease

## 2017-08-10 DIAGNOSIS — Z85828 Personal history of other malignant neoplasm of skin: Secondary | ICD-10-CM | POA: Insufficient documentation

## 2017-08-10 DIAGNOSIS — I110 Hypertensive heart disease with heart failure: Secondary | ICD-10-CM | POA: Diagnosis not present

## 2017-08-10 DIAGNOSIS — Z7982 Long term (current) use of aspirin: Secondary | ICD-10-CM | POA: Insufficient documentation

## 2017-08-10 DIAGNOSIS — I5032 Chronic diastolic (congestive) heart failure: Secondary | ICD-10-CM | POA: Insufficient documentation

## 2017-08-10 DIAGNOSIS — I350 Nonrheumatic aortic (valve) stenosis: Secondary | ICD-10-CM | POA: Diagnosis not present

## 2017-08-10 DIAGNOSIS — J439 Emphysema, unspecified: Secondary | ICD-10-CM | POA: Diagnosis not present

## 2017-08-10 DIAGNOSIS — Z9049 Acquired absence of other specified parts of digestive tract: Secondary | ICD-10-CM | POA: Insufficient documentation

## 2017-08-10 DIAGNOSIS — Z87891 Personal history of nicotine dependence: Secondary | ICD-10-CM | POA: Diagnosis not present

## 2017-08-10 DIAGNOSIS — Z85038 Personal history of other malignant neoplasm of large intestine: Secondary | ICD-10-CM | POA: Diagnosis not present

## 2017-08-10 DIAGNOSIS — I712 Thoracic aortic aneurysm, without rupture: Secondary | ICD-10-CM | POA: Insufficient documentation

## 2017-08-10 DIAGNOSIS — Z79899 Other long term (current) drug therapy: Secondary | ICD-10-CM | POA: Insufficient documentation

## 2017-08-10 HISTORY — PX: RIGHT/LEFT HEART CATH AND CORONARY ANGIOGRAPHY: CATH118266

## 2017-08-10 LAB — POCT I-STAT 3, VENOUS BLOOD GAS (G3P V)
Acid-base deficit: 2 mmol/L (ref 0.0–2.0)
Bicarbonate: 22.6 mmol/L (ref 20.0–28.0)
O2 Saturation: 73 %
TCO2: 24 mmol/L (ref 22–32)
pCO2, Ven: 38.9 mmHg — ABNORMAL LOW (ref 44.0–60.0)
pH, Ven: 7.372 (ref 7.250–7.430)
pO2, Ven: 40 mmHg (ref 32.0–45.0)

## 2017-08-10 LAB — POCT I-STAT 3, ART BLOOD GAS (G3+)
Acid-base deficit: 1 mmol/L (ref 0.0–2.0)
Bicarbonate: 23.9 mmol/L (ref 20.0–28.0)
O2 Saturation: 94 %
TCO2: 25 mmol/L (ref 22–32)
pCO2 arterial: 39.6 mmHg (ref 32.0–48.0)
pH, Arterial: 7.388 (ref 7.350–7.450)
pO2, Arterial: 70 mmHg — ABNORMAL LOW (ref 83.0–108.0)

## 2017-08-10 SURGERY — RIGHT/LEFT HEART CATH AND CORONARY ANGIOGRAPHY
Anesthesia: LOCAL

## 2017-08-10 MED ORDER — LIDOCAINE HCL (PF) 1 % IJ SOLN
INTRAMUSCULAR | Status: AC
  Filled 2017-08-10: qty 30

## 2017-08-10 MED ORDER — HEPARIN (PORCINE) IN NACL 2-0.9 UNIT/ML-% IJ SOLN
INTRAMUSCULAR | Status: AC | PRN
  Administered 2017-08-10: 1000 mL

## 2017-08-10 MED ORDER — FENTANYL CITRATE (PF) 100 MCG/2ML IJ SOLN
INTRAMUSCULAR | Status: AC
  Filled 2017-08-10: qty 2

## 2017-08-10 MED ORDER — SODIUM CHLORIDE 0.9 % IV SOLN
INTRAVENOUS | Status: AC
  Administered 2017-08-10: 13:00:00 via INTRAVENOUS

## 2017-08-10 MED ORDER — SODIUM CHLORIDE 0.9 % IV SOLN: 250.0000 mL | INTRAVENOUS | Status: DC | PRN

## 2017-08-10 MED ORDER — SODIUM CHLORIDE 0.9% FLUSH: 3.0000 mL | INTRAVENOUS | Status: DC | PRN

## 2017-08-10 MED ORDER — VERAPAMIL HCL 2.5 MG/ML IV SOLN
INTRAVENOUS | Status: AC
  Filled 2017-08-10: qty 2

## 2017-08-10 MED ORDER — IOPAMIDOL (ISOVUE-370) INJECTION 76%
INTRAVENOUS | Status: DC | PRN
  Administered 2017-08-10: 75 mL via INTRA_ARTERIAL

## 2017-08-10 MED ORDER — VERAPAMIL HCL 2.5 MG/ML IV SOLN
INTRAVENOUS | Status: DC | PRN
  Administered 2017-08-10: 15:00:00 via INTRA_ARTERIAL

## 2017-08-10 MED ORDER — HEPARIN SODIUM (PORCINE) 1000 UNIT/ML IJ SOLN
INTRAMUSCULAR | Status: DC | PRN
  Administered 2017-08-10: 5500 [IU] via INTRAVENOUS

## 2017-08-10 MED ORDER — HEPARIN (PORCINE) IN NACL 2-0.9 UNIT/ML-% IJ SOLN
INTRAMUSCULAR | Status: AC
  Filled 2017-08-10: qty 1000

## 2017-08-10 MED ORDER — SODIUM CHLORIDE 0.9 % IV SOLN: INTRAVENOUS | Status: AC

## 2017-08-10 MED ORDER — IOPAMIDOL (ISOVUE-370) INJECTION 76%
INTRAVENOUS | Status: AC
  Filled 2017-08-10: qty 100

## 2017-08-10 MED ORDER — SODIUM CHLORIDE 0.9% FLUSH: 3.0000 mL | Freq: Two times a day (BID) | INTRAVENOUS | Status: DC

## 2017-08-10 MED ORDER — MIDAZOLAM HCL 2 MG/2ML IJ SOLN
INTRAMUSCULAR | Status: DC | PRN
  Administered 2017-08-10: 1 mg via INTRAVENOUS

## 2017-08-10 MED ORDER — FENTANYL CITRATE (PF) 100 MCG/2ML IJ SOLN
INTRAMUSCULAR | Status: DC | PRN
  Administered 2017-08-10: 25 ug via INTRAVENOUS

## 2017-08-10 MED ORDER — LIDOCAINE HCL (PF) 1 % IJ SOLN
INTRAMUSCULAR | Status: DC | PRN
  Administered 2017-08-10 (×2): 2 mL

## 2017-08-10 MED ORDER — ASPIRIN 81 MG PO CHEW: 81.0000 mg | CHEWABLE_TABLET | ORAL | Status: DC

## 2017-08-10 MED ORDER — HEPARIN SODIUM (PORCINE) 1000 UNIT/ML IJ SOLN
INTRAMUSCULAR | Status: AC
  Filled 2017-08-10: qty 1

## 2017-08-10 MED ORDER — MIDAZOLAM HCL 2 MG/2ML IJ SOLN
INTRAMUSCULAR | Status: AC
  Filled 2017-08-10: qty 2

## 2017-08-10 SURGICAL SUPPLY — 13 items
CATH 5FR JL3.5 JR4 ANG PIG MP (CATHETERS) ×1 IMPLANT
CATH BALLN WEDGE 5F 110CM (CATHETERS) ×1 IMPLANT
CATH INFINITI 5FR AL1 (CATHETERS) ×1 IMPLANT
DEVICE RAD COMP TR BAND LRG (VASCULAR PRODUCTS) ×1 IMPLANT
GLIDESHEATH SLEND SS 6F .021 (SHEATH) ×1 IMPLANT
GUIDEWIRE INQWIRE 1.5J.035X260 (WIRE) IMPLANT
INQWIRE 1.5J .035X260CM (WIRE) ×2
KIT HEART LEFT (KITS) ×2 IMPLANT
PACK CARDIAC CATHETERIZATION (CUSTOM PROCEDURE TRAY) ×2 IMPLANT
SHEATH GLIDE SLENDER 4/5FR (SHEATH) ×1 IMPLANT
TRANSDUCER W/STOPCOCK (MISCELLANEOUS) ×2 IMPLANT
TUBING CIL FLEX 10 FLL-RA (TUBING) ×2 IMPLANT
WIRE EMERALD ST .035X150CM (WIRE) ×1 IMPLANT

## 2017-08-10 NOTE — Discharge Instructions (Signed)

## 2017-08-10 NOTE — Interval H&P Note (Signed)
History and Physical Interval Note:  08/10/2017 2:15 PM  Barry Decker  has presented today for cardiac cath with the diagnosis of severe aortic stenosis  The various methods of treatment have been discussed with the patient and family. After consideration of risks, benefits and other options for treatment, the patient has consented to  Procedure(s): RIGHT/LEFT HEART CATH AND CORONARY ANGIOGRAPHY (N/A) as a surgical intervention .  The patient's history has been reviewed, patient examined, no change in status, stable for surgery.  I have reviewed the patient's chart and labs.  Questions were answered to the patient's satisfaction.    Cath Lab Visit (complete for each Cath Lab visit)  Clinical Evaluation Leading to the Procedure:   ACS: No.  Non-ACS:    Anginal Classification: CCS II  Anti-ischemic medical therapy: Minimal Therapy (1 class of medications)  Non-Invasive Test Results: No non-invasive testing performed  Prior CABG: No previous CABG        Lauree Chandler

## 2017-08-13 ENCOUNTER — Encounter (HOSPITAL_COMMUNITY): Payer: Self-pay | Admitting: Cardiovascular Disease

## 2017-08-13 MED FILL — Heparin Sodium (Porcine) 2 Unit/ML in Sodium Chloride 0.9%: INTRAMUSCULAR | Qty: 1000 | Status: AC

## 2017-08-22 ENCOUNTER — Institutional Professional Consult (permissible substitution): Payer: PPO | Admitting: Surgery

## 2017-08-22 VITALS — BP 144/77 | HR 84 | Resp 20 | Ht 72.0 in | Wt 235.0 lb

## 2017-08-22 DIAGNOSIS — I350 Nonrheumatic aortic (valve) stenosis: Secondary | ICD-10-CM | POA: Diagnosis not present

## 2017-08-22 DIAGNOSIS — I77810 Thoracic aortic ectasia: Secondary | ICD-10-CM

## 2017-08-23 ENCOUNTER — Other Ambulatory Visit: Payer: Self-pay

## 2017-08-23 DIAGNOSIS — I350 Nonrheumatic aortic (valve) stenosis: Secondary | ICD-10-CM

## 2017-08-23 NOTE — Progress Notes (Signed)
Pre TAVR testing arranged.

## 2017-08-27 ENCOUNTER — Encounter: Payer: Self-pay | Admitting: Surgery

## 2017-08-27 NOTE — Progress Notes (Signed)
Patient ID: Barry Decker, male   DOB: 06-21-1932, 84 y.o.   MRN: [id number]  Barry Decker SURGERY CONSULTATION REPORT  Referring Provider is Barry Decker* PCP is Barry Olp, MD  Chief Complaint  Patient presents with  . Thoracic Aortic Aneurysm    Surgical eval, CTA Chest 08/03/17, ECHO 07/12/17, Cardiac Cath 08/10/17    HPI:  The patient is an 84 year old gentleman with hypertension, hyperlipidemia, and aortic stenosis who had rheumatic fever as a child and a heart murmur most of his life.  An echocardiogram in 09/2011 showed moderate aortic stenosis with a mean gradient of 31 mmHg and a peak gradient of 54 mmHg.  There is moderate aortic insufficiency at that time.  He has been followed with serial echocardiograms and his most recent echocardiogram on 07/12/2017 showed a trileaflet aortic valve with moderate thickening and calcification of the leaflets and restricted mobility.  There is severe annular calcification.  The mean gradient was 33 mmHg with a peak gradient of 57 mmHg.  Dimensionless index was 0.32 with a valve area calculated at 1.8 cm.  There is mild aortic insufficiency.  Left ventricular ejection fraction was normal at 65-70% with grade 1 diastolic dysfunction.  He underwent cardiac catheterization on 08/10/2017 which showed a mean gradient of 32 mmHg with a peak to peak gradient of 33 mmHg and an aortic valve area at 1.37 cm.  He had mild nonobstructive coronary disease.  Right heart pressures were normal.  He was also noted to have an enlarged aortic root and underwent a CTA of the chest on 07/23/2017 which showed a transverse dimension at the level of the sinus of Valsalva 4.8 cm.  The ascending aorta was 4.6 cm with the aortic arch 3.7 cm in the descending aorta 3.5 cm.  There is also a 6 mm pleural-based nodule in the right lower lobe as well as some inferior left lower lobe mild nodular parenchymal  changes measuring up to 8 x 6 mm.  Follow-up CT scan was recommended in 3-6 months.  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  Over the past few months he has developed exertional shortness of breath and fatigue as well as some orthopnea.  His wife is with him today and is noted that he gets short of breath with low level activity which never used to bother him before.  He has had some lower extremity edema and is on Lasix.  He denies any dizziness or syncope.  He has had no chest pain or pressure.  Past Medical History:  Diagnosis Date  . Aortic valve disease   . Arthritis    "spinal" (11/16/2015)  . Colon cancer ([place])    precancer-partial colectomy  . Elevated HDL   . Gout   . Heart murmur   . Hypertension   . Kidney stones   . Squamous cell skin cancer     Past Surgical History:  Procedure Laterality Date  . BACK SURGERY    . CATARACT EXTRACTION W/ INTRAOCULAR LENS  IMPLANT, BILATERAL Bilateral 2017  . COLONOSCOPY W/ POLYPECTOMY    . FACET JOINT INJECTION  2009  . LITHOTRIPSY  2010  . LUMBAR LAMINECTOMY  ~ 1992  . PARTIAL COLECTOMY  1997   precancerours  . PARTIAL KNEE ARTHROPLASTY Left 11/16/2015   Procedure: LEFT KNEE UNICOMPARTMENTAL ;  Surgeon: Barry Butters, MD;  Location: [facility];  Service: Orthopedics;  Laterality: Left;  . REPLACEMENT UNICONDYLAR  JOINT KNEE Left 11/16/2015  . RIGHT/LEFT HEART CATH AND CORONARY ANGIOGRAPHY N/A 08/10/2017   Procedure: RIGHT/LEFT HEART CATH AND CORONARY ANGIOGRAPHY;  Surgeon: Barry Blanks, MD;  Location: [facility];  Service: Cardiovascular;  Laterality: N/A;  . TONSILLECTOMY    . TYMPANOPLASTY Left 1998   ruptured ear drum with skin graft    Family History  Problem Relation Age of Onset  . Heart attack Mother 49       former smoker  . Heart attack Father 16       former smoker  . Hypertension Sister   . Heart attack Brother 26       Rheumatic fever    Social History   Socioeconomic History  . Marital  status: Married    Spouse name: Not on file  . Number of children: 5  . Years of education: Not on file  . Highest education level: Not on file  Social Needs  . Financial resource strain: Not on file  . Food insecurity - worry: Not on file  . Food insecurity - inability: Not on file  . Transportation needs - medical: Not on file  . Transportation needs - non-medical: Not on file  Occupational History  . Occupation: Government social research officer  Tobacco Use  . Smoking status: Former Smoker    Packs/day: 1.00    Years: 20.00    Pack years: 20.00    Types: Cigarettes    Last attempt to quit: 09/15/1980    Years since quitting: 36.9  . Smokeless tobacco: Never Used  Substance and Sexual Activity  . Alcohol use: Yes    Alcohol/week: 7.2 oz    Types: 10 Standard drinks or equivalent, 1 Glasses of wine, 1 Shots of liquor per week  . Drug use: No  . Sexual activity: No  Other Topics Concern  . Not on file  Social History Narrative   Married. 5 children. 4 grandkids. 1 greatgrandkid.       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       Hobbies: woodworking      Advised to consider advanced directives/hcpoa-thinks wife may have 43 from years ago.    Current Outpatient Medications  Medication Sig Dispense Refill  . allopurinol (ZYLOPRIM) 300 MG tablet Take 1 tablet by mouth once daily (Patient taking differently: Take 300 mg by mouth once daily) 90 tablet 1  . amLODipine (NORVASC) 10 MG tablet Take 1 tablet by mouth daily (Patient taking differently: Take 10 mg by mouth once daily) 90 tablet 2  . aspirin 325 MG tablet Take 162.5 mg by mouth daily.    . IRON PO Take 1 tablet by mouth daily.     . potassium citrate (UROCIT-K) 10 MEQ (1080 MG) SR tablet Take 1 tablet (10 mEq total) by mouth 3 (three) times daily with meals. (Patient taking differently: Take 10 mEq by mouth daily. ) 300 tablet 1  . quinapril (ACCUPRIL) 40 MG tablet Take 1 tablet by mouth daily (Patient  taking differently: Take 40 mg by mouth daily) 90 tablet 1  . furosemide (LASIX) 20 MG tablet Take 1 tablet (20 mg total) by mouth daily. 90 tablet 2   No current facility-administered medications for this visit.     No Known Allergies    Review of Systems:   General:  normal appetite, reduced energy, no weight gain, no weight loss, no fever  Cardiac:  no chest pain with exertion, no chest pain at rest, +SOB with  mild exertion, no resting SOB, no PND, + orthopnea, no palpitations, no arrhythmia, no atrial fibrillation, + LE edema, no dizzy spells, no syncope  Respiratory:  exertional shortness of breath, no home oxygen, no productive cough, no dry cough, no bronchitis, no wheezing, no hemoptysis, no asthma, no pain with inspiration or cough, no sleep apnea, no CPAP at night  GI:   no difficulty swallowing, no reflux, no frequent heartburn, no hiatal hernia, no abdominal pain, no constipation, no diarrhea, no hematochezia, no hematemesis, no melena  GU:   no dysuria,  no frequency, no urinary tract infection, no hematuria, no enlarged prostate, + kidney stones, no kidney disease  Vascular:  no pain suggestive of claudication, no pain in feet, no leg cramps, no varicose veins, no DVT, no non-healing foot ulcer  Neuro:   no stroke, no TIA's, no seizures, no headaches, no temporary blindness one eye,  no slurred speech, no peripheral neuropathy, no chronic pain, no instability of gait, no memory/cognitive dysfunction  Musculoskeletal: + arthritis, no joint swelling, no myalgias, no difficulty walking, normal mobility   Skin:   no rash, no itching, no skin infections, no pressure sores or ulcerations  Psych:   no anxiety, no depression, no nervousness, no unusual recent stress  Eyes:   no blurry vision, no floaters, no recent vision changes,  wears glasses or contacts  ENT:   + hearing loss, no loose or painful teeth, no dentures, last saw dentist last year  Hematologic:  no easy bruising, no  abnormal bleeding, no clotting disorder, no frequent epistaxis  Endocrine:  no diabetes, does not check CBG's at home       Physical Exam:   BP (!) 144/77   Pulse 84   Resp 20   Ht 6' (1.829 m)   Wt 235 lb (106.6 kg)   SpO2 97% Comment: RA  BMI 31.87 kg/m   General:  Elderly but  well-appearing  HEENT:  Unremarkable, NCAT, PERLA, EOMI, oropharynx clear  Neck:   no JVD, no bruits, no adenopathy or thyromegaly  Chest:   clear to auscultation, symmetrical breath sounds, no wheezes, no rhonchi   CV:   RRR, grade III/VI crescendo/decrescendo murmur heard best at RSB,  no diastolic murmur  Abdomen:  soft, non-tender, no masses or organomegaly  Extremities:  warm, well-perfused, pulses palpable in feet, no LE edema  Rectal/GU  Deferred  Neuro:   Grossly non-focal and symmetrical throughout  Skin:   Clean and dry, no rashes, no breakdown   Diagnostic Tests:  Result status: Final result                           [facility]                        [address], [place] [address]                            [phone]  ------------------------------------------------------------------- Transthoracic Echocardiography  Patient:    Barry Decker, Barry Decker MR #:       [id number] Study Date: 07/12/2017 Gender:     M Age:        64 Height:     182.9 cm Weight:  107 kg BSA:        2.36 m^2 Pt. Status: Room:   ATTENDING    Fransico Him, MD  Willia Craze, Christopher  REFERRING    [place], [place], Outpatient  SONOGRAPHER  [facility], RDCS  cc:  ------------------------------------------------------------------- LV EF: 65% -   70%  ------------------------------------------------------------------- Indications:      Aortic Stenosis (I35.0).  ------------------------------------------------------------------- History:   PMH:  History of Moderate Aortic Stenosis (prior mean gradient  57mmHG, 10-23-16)  Dyspnea and murmur.  Congestive heart failure.  Aortic valve disease.  Risk factors:  Family history of coronary artery disease. Former tobacco use. Hypertension. Dyslipidemia.  ------------------------------------------------------------------- Study Conclusions  - Left ventricle: The cavity size was mildly dilated. There was   moderate concentric hypertrophy. Systolic function was vigorous.   The estimated ejection fraction was in the range of 65% to 70%.   Wall motion was normal; there were no regional wall motion   abnormalities. There was an increased relative contribution of   atrial contraction to ventricular filling. Doppler parameters are   consistent with abnormal left ventricular relaxation (grade 1   diastolic dysfunction). Doppler parameters are consistent with   high ventricular filling pressure. - Aortic valve: Severely calcified annulus. Trileaflet; moderately   thickened, moderately calcified leaflets. Valve mobility was   mildly restricted. There was moderate stenosis. There was mild   regurgitation. Mean gradient (S): 33 mm Hg. Valve area (VTI):   1.77 cm^2. Valve area (Vmax): 1.86 cm^2. Valve area (Vmean): 1.78   cm^2. - Aorta: Aortic root dimension: 49 mm (ED). Ascending aortic   diameter: 49 mm (S). - Aortic root: The aortic root was moderately dilated. - Ascending aorta: The ascending aorta was moderately dilated. - Left atrium: The atrium was mildly dilated. - Pulmonary arteries: PA peak pressure: 41 mm Hg (S). - Impressions: Moderate LVH, mildly dilated LV, mild LAE, normal   LVH EF 60-65%, calcified AV with reduced leaflet mobility and   moderate AS and mild AI. There is moderate dilatation of the   aortic root at the sinus of Vlalsalva and moderately dilated   ascending aorta. Consider chest CT angio or chest MRI/MRA for   further evaluation. COmpared to prior echo, the mean AV gradient   has not changed but the ascending aorta and  aortic root diameters   have increased (aortic root 48>37mm and ascending aorta 44>46mm).  Impressions:  - Moderate LVH, mildly dilated LV, mild LAE, normal LVH EF 60-65%,   calcified AV with reduced leaflet mobility and moderate AS and   mild AI. There is moderate dilatation of the aortic root at the   sinus of Vlalsalva and moderately dilated ascending aorta.   Consider chest CT angio or chest MRI/MRA for further evaluation.   COmpared to prior echo, the mean AV gradient has not changed but   the ascending aorta and aortic root diameters have increased   (aortic root 48>14mm and ascending aorta 44>73mm). The right   ventricular systolic pressure was increased consistent with mild   pulmonary hypertension.  ------------------------------------------------------------------- Study data:  Comparison was made to the study of 10/23/2016.  Study status:  Routine.  Procedure:  Transthoracic echocardiography. Image quality was adequate.          Transthoracic echocardiography.  M-mode, complete 2D, spectral Doppler, and color Doppler.  Birthdate:  Patient birthdate: 1932/08/18.  Age:  Patient is 84 yr old.  Sex:  Gender: male.  BMI: 32 kg/m^2.  Blood pressure:     136/70  Patient status:  Outpatient.  Study date: Study date: 07/12/2017. Study time: 09:39 AM.  Location:  [facility]  -------------------------------------------------------------------  ------------------------------------------------------------------- Left ventricle:  The cavity size was mildly dilated. There was moderate concentric hypertrophy. Systolic function was vigorous. The estimated ejection fraction was in the range of 65% to 70%. Wall motion was normal; there were no regional wall motion abnormalities. There was an increased relative contribution of atrial contraction to ventricular filling. Doppler parameters are consistent with abnormal left ventricular relaxation (grade 1 diastolic  dysfunction). Doppler parameters are consistent with high ventricular filling pressure.  ------------------------------------------------------------------- Aortic valve:   Severely calcified annulus. Trileaflet; moderately thickened, moderately calcified leaflets. Valve mobility was mildly restricted.  Doppler:   There was moderate stenosis.   There was mild regurgitation.    VTI ratio of LVOT to aortic valve: 0.31. Valve area (VTI): 1.77 cm^2. Indexed valve area (VTI): 0.75 cm^2/m^2. Peak velocity ratio of LVOT to aortic valve: 0.32. Valve area (Vmax): 1.86 cm^2. Indexed valve area (Vmax): 0.79 cm^2/m^2. Mean velocity ratio of LVOT to aortic valve: 0.31. Valve area (Vmean): 1.78 cm^2. Indexed valve area (Vmean): 0.75 cm^2/m^2. Mean gradient (S): 33 mm Hg. Peak gradient (S): 57 mm Hg.  ------------------------------------------------------------------- Aorta:  Aortic root: The aortic root was moderately dilated. Ascending aorta: The ascending aorta was moderately dilated.  ------------------------------------------------------------------- Mitral valve:   Structurally normal valve.   Mobility was not restricted.  Doppler:  Transvalvular velocity was within the normal range. There was no evidence for stenosis. There was no regurgitation.    Peak gradient (D): 3 mm Hg.  ------------------------------------------------------------------- Left atrium:  The atrium was mildly dilated.  ------------------------------------------------------------------- Right ventricle:  The cavity size was normal. Wall thickness was normal. Systolic function was normal.  ------------------------------------------------------------------- Pulmonic valve:    Structurally normal valve.   Cusp separation was normal.  Doppler:  Transvalvular velocity was within the normal range. There was no evidence for stenosis. There was  no regurgitation.  ------------------------------------------------------------------- Tricuspid valve:   Structurally normal valve.    Doppler: Transvalvular velocity was within the normal range. There was mild regurgitation.  ------------------------------------------------------------------- Pulmonary artery:   The main pulmonary artery was normal-sized. Systolic pressure was within the normal range.  ------------------------------------------------------------------- Right atrium:  The atrium was normal in size.  ------------------------------------------------------------------- Pericardium:  There was no pericardial effusion.  ------------------------------------------------------------------- Systemic veins: Inferior vena cava: The vessel was normal in size.  ------------------------------------------------------------------- Measurements   Left ventricle                           Value          Reference  LV ID, ED, PLAX chordal          (H)     53    mm       43 - 52  LV ID, ES, PLAX chordal                  29.2  mm       23 - 38  LV fx shortening, PLAX chordal           45    %        >=29  LV PW thickness, ED                      13.1  mm       ----------  IVS/LV PW ratio, ED                      0.98           <=1.3  Stroke volume, 2D                        162   ml       ----------  Stroke volume/bsa, 2D                    69    ml/m^2   ----------  LV e&', lateral                           5.11  cm/s     ----------  LV E/e&', lateral                         16.26          ----------  LV e&', medial                            4.9   cm/s     ----------  LV E/e&', medial                          16.96          ----------  LV e&', average                           5.01  cm/s     ----------  LV E/e&', average                         16.6           ----------    Ventricular septum                       Value          Reference  IVS thickness, ED                         12.9  mm       ----------    LVOT                                     Value          Reference  LVOT ID, S                               27    mm       ----------  LVOT area                                5.73  cm^2     ----------  LVOT peak velocity, S                    122   cm/s     ----------  LVOT mean velocity, S  83    cm/s     ----------  LVOT VTI, S                              28.2  cm       ----------  LVOT peak gradient, S                    6     mm Hg    ----------    Aortic valve                             Value          Reference  Aortic valve peak velocity, S            376   cm/s     ----------  Aortic valve mean velocity, S            267   cm/s     ----------  Aortic valve VTI, S                      91.3  cm       ----------  Aortic mean gradient, S                  33    mm Hg    ----------  Aortic peak gradient, S                  57    mm Hg    ----------  VTI ratio, LVOT/AV                       0.31           ----------  Aortic valve area, VTI                   1.77  cm^2     ----------  Aortic valve area/bsa, VTI               0.75  cm^2/m^2 ----------  Velocity ratio, peak, LVOT/AV            0.32           ----------  Aortic valve area, peak velocity         1.86  cm^2     ----------  Aortic valve area/bsa, peak              0.79  cm^2/m^2 ----------  velocity  Velocity ratio, mean, LVOT/AV            0.31           ----------  Aortic valve area, mean velocity         1.78  cm^2     ----------  Aortic valve area/bsa, mean              0.75  cm^2/m^2 ----------  velocity  Aortic regurg pressure half-time         470   ms       ----------    Aorta                                    Value          Reference  Aortic  root ID, ED                       49    mm       ----------  Ascending aorta ID, A-P, S               49    mm       ----------    Left atrium                              Value          Reference  LA ID, A-P, ES                            51    mm       ----------  LA ID/bsa, A-P                           2.16  cm/m^2   <=2.2  LA volume, S                             63.2  ml       ----------  LA volume/bsa, S                         26.8  ml/m^2   ----------  LA volume, ES, 1-p A4C                   45.7  ml       ----------  LA volume/bsa, ES, 1-p A4C               19.4  ml/m^2   ----------  LA volume, ES, 1-p A2C                   72.4  ml       ----------  LA volume/bsa, ES, 1-p A2C               30.7  ml/m^2   ----------    Mitral valve                             Value          Reference  Mitral E-wave peak velocity              83.1  cm/s     ----------  Mitral A-wave peak velocity              135   cm/s     ----------  Mitral deceleration time         (H)     342   ms       150 - 230  Mitral peak gradient, D                  3     mm Hg    ----------  Mitral E/A ratio, peak                   0.6            ----------    Pulmonary arteries  Value          Reference  PA pressure, S, DP               (H)     41    mm Hg    <=30    Tricuspid valve                          Value          Reference  Tricuspid regurg peak velocity           307   cm/s     ----------  Tricuspid peak RV-RA gradient            38    mm Hg    ----------    Right atrium                             Value          Reference  RA ID, S-I, ES, A4C              (H)     56.5  mm       34 - 49  RA area, ES, A4C                         15.4  cm^2     8.3 - 19.5  RA volume, ES, A/L                       34.9  ml       ----------  RA volume/bsa, ES, A/L                   14.8  ml/m^2   ----------    Right ventricle                          Value          Reference  TAPSE                                    29.6  mm       ----------  RV s&', lateral, S                        20.3  cm/s     ----------  Legend: (L)  and  (H)  mark values outside specified reference  range.  ------------------------------------------------------------------- Prepared and Electronically Authenticated by  Fransico Him, MD 2019-01-24T10:47:10    Physicians   Panel Physicians Referring Physician Case Authorizing Physician  Barry Blanks, MD (Primary)    Procedures   RIGHT/LEFT HEART CATH AND CORONARY ANGIOGRAPHY  Conclusion     Mid RCA lesion is 20% stenosed.  Prox LAD lesion is 20% stenosed.  Ost 1st Diag lesion is 60% stenosed.   1. Mild non-obstructive disease in the LAD, Circumflex and RCA\ 2. Moderate stenosis small caliber Diagonal branch 3. Severe aortic stenosis (peak to peak gradient 33 mmHg, mean gradient 32 mmHg, AVA 1.37 cm2).   Recommendations: Will continue workup for AVR. I think we will likely end up pursuing TAVR but given his dilated aortic root, I want to have Dr. Cyndia Bent see him before we plan  further testing.    Indications   Severe aortic stenosis [I35.0 (ICD-10-CM)]  Procedural Details/Technique   Technical Details Indication: 84 yo male with thoracic aortic aneurysm and severe AS.   Procedure: The risks, benefits, complications, treatment options, and expected outcomes were discussed with the patient. The patient and/or family concurred with the proposed plan, giving informed consent. The patient was sedated with Versed and Fentanyl. I changed the antecubital IV in the right antecubital vein for a 5 French sheath. The right wrist was prepped and draped in a sterile fashion. 1% lidocaine was used for local anesthesia. Using the modified Seldinger access technique, a 5 French sheath was placed in the right radial artery. 3 mg Verapamil was given through the sheath. 5500 units IV heparin was given. Standard diagnostic catheters were used to perform selective coronary angiography. I crossed the the aortic valve with an AL-1 catheter and a straight wire. LV pressures measured. No left ventricular angiogram was performed. The  sheath was removed from the right radial artery and a Terumo hemostasis band was applied at the arteriotomy site on the right wrist.     Estimated blood loss <50 mL.  During this procedure the patient was administered the following to achieve and maintain moderate conscious sedation: Versed 1 mg, Fentanyl 25 mcg, while the patient's heart rate, blood pressure, and oxygen saturation were continuously monitored. The period of conscious sedation was 38 minutes, of which I was present face-to-face 100% of this time.  Complications   Complications documented before study signed (08/10/2017 3:16 PM EST)    RIGHT/LEFT HEART CATH AND CORONARY ANGIOGRAPHY   None Documented by Barry Blanks, MD 08/10/2017 3:15 PM EST  Time Range: Intra-procedure      Coronary Findings   Diagnostic  Dominance: Right  Left Anterior Descending  Prox LAD lesion 20% stenosed  Prox LAD lesion is 20% stenosed.  First Diagonal Branch  Ost 1st Diag lesion 60% stenosed  Ost 1st Diag lesion is 60% stenosed.  Right Coronary Artery  Vessel is large.  Mid RCA lesion 20% stenosed  Mid RCA lesion is 20% stenosed.  Intervention   No interventions have been documented.  Coronary Diagrams   Diagnostic Diagram       Implants     No implant documentation for this case.  MERGE Images   Show images for CARDIAC CATHETERIZATION   Link to Procedure Log   Procedure Log    Hemo Data    Most Recent Value  Fick Cardiac Output 7.67 L/min  Fick Cardiac Output Index 3.35 (L/min)/BSA  Aortic Mean Gradient 32 mmHg  Aortic Peak Gradient 33 mmHg  Aortic Valve Area 1.37  Aortic Value Area Index 0.6 cm2/BSA  RA A Wave 7 mmHg  RA V Wave 6 mmHg  RA Mean 4 mmHg  RV Systolic Pressure 29 mmHg  RV Diastolic Pressure 0 mmHg  RV EDP 6 mmHg  PA Systolic Pressure 23 mmHg  PA Diastolic Pressure 3 mmHg  PA Mean 11 mmHg  PW A Wave 6 mmHg  PW V Wave 5 mmHg  PW Mean 4 mmHg  AO Systolic Pressure 938 mmHg  AO  Diastolic Pressure 62 mmHg  AO Mean 92 mmHg  LV Systolic Pressure 182 mmHg  LV Diastolic Pressure 11 mmHg  LV EDP 14 mmHg  Arterial Occlusion Pressure Extended Systolic Pressure 993 mmHg  Arterial Occlusion Pressure Extended Diastolic Pressure 64 mmHg  Arterial Occlusion Pressure Extended Mean Pressure 94 mmHg  Left Ventricular Apex Extended Systolic Pressure  170 mmHg  Left Ventricular Apex Extended Diastolic Pressure 9 mmHg  Left Ventricular Apex Extended EDP Pressure 16 mmHg  QP/QS 1  TPVR Index 3.29 HRUI  TSVR Index 27.46 HRUI  PVR SVR Ratio 0.08  TPVR/TSVR Ratio 0.12    TECHNIQUE: Multidetector CT imaging of the chest was performed using the standard protocol during bolus administration of intravenous contrast. Multiplanar CT image reconstructions and MIPs were obtained to evaluate the vascular anatomy.  CONTRAST:  19mL ISOVUE-370 IOPAMIDOL (ISOVUE-370) INJECTION 76%  COMPARISON:  No comparison chest CT.  Abdominopelvic CT 03/09/2016.  FINDINGS: Cardiovascular: Calcified aortic valve. Calcified plaque throughout thoracic aorta and origin of great vessels. Dilated ascending thoracic aorta with proximal to mid ascending thoracic aorta measuring up to 4.6 cm. Cardiac motion without dissection noted. At the level of the sinus of Valsalva, transverse dimension of 4.8 cm. At the sinotubular junction, transverse dimension of 4.1 cm. Aortic arch with transverse dimension of 3.7 cm. Descending thoracic aorta measures up to 3.5 cm.  Coronary artery calcifications.  Heart size top-normal.  No pulmonary embolus.  Mediastinum/Nodes: No mediastinal/hilar adenopathy. No obvious esophageal abnormality.  Lungs/Pleura: 6 mm pleural based nodule right lower lobe (series 6, image 62). Inferior left lower lobe mild nodular parenchymal changes measuring up to 8 x 6 mm (series 6, image 69). Calcified granuloma peripheral aspect left upper lobe.  Scattered mild emphysematous  changes.  Upper Abdomen: No worrisome abnormality.  Musculoskeletal: Ankylosis thoracic spine.  Review of the MIP images confirms the above findings.  IMPRESSION: Calcified aortic valve. Dilated ascending thoracic aorta with proximal to mid ascending thoracic aorta measuring up to 4.6 cm. Sinus of Valsalva, transverse dimension of 4.8 cm. Sinotubular junction, transverse dimension of 4.1 cm. Aortic arch transverse dimension of 3.7 cm. Descending thoracic aorta measures up to 3.5 cm.  Coronary artery calcifications.  6 mm pleural based nodule right lower lobe (series 6, image 62). Inferior left lower lobe mild nodular parenchymal changes measuring up to 8 x 6 mm (series 6, image 69). Non-contrast chest CT at 3-6 months is recommended. If the nodules are stable at time of repeat CT, then future CT at 18-24 months (from today's scan) is considered optional for low-risk patients, but is recommended for high-risk patients. This recommendation follows the consensus statement: Guidelines for Management of Incidental Pulmonary Nodules Detected on CT Images: From the Fleischner Society 2017; Radiology 2017; 284:228-243.  Ankylosis thoracic spine.  Aortic Atherosclerosis (ICD10-I70.0).  Emphysema (ICD10-J43.9).   Electronically Signed   By: Genia Del M.D.   On: 07/24/2017 08:49  [facility] Adult Cardiac Surgery Database Version 2.9 RISK SCORES Procedure: Isolated AVR CALCULATE  Risk of Mortality:  1.485%   Renal Failure:  1.848%   Permanent Stroke:  1.741%   Prolonged Ventilation:  4.774%   DSW Infection:  0.107%   Reoperation:  4.040%   Morbidity or Mortality:  8.673%   Short Length of Stay:  41.811%   Long Length of Stay:  2.950%    Impression:  This 84 year old gentleman has stage D, moderate, symptomatic aortic stenosis with New York Heart Association class II symptoms of exertional fatigue and shortness of breath as well as orthopnea and lower extremity edema  that have developed over the past several months consistent with chronic diastolic congestive heart failure.  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  I have personally reviewed his most recent echocardiogram and cardiac catheterization.  The aortic valve is trileaflet with moderate calcification and and thickening of the leaflets with mildly restricted mobility.  Although the mean gradient was measured up to 38 mmHg the valve area was calculated at 1.8 cm.  There is mild to moderate aortic insufficiency with a pressure half-time of 470 ms.  Cardiac catheterization shows large coronary arteries with no significant disease.  The mean gradient measured at catheterization was 32 mmHg.  His left ventricular systolic function is normal.  His symptoms appear to be due to aortic stenosis although it is still technically in the moderate range.  Aortic valve replacement may be the best treatment form although that decision is complicated by the fact that he has enlargement of his aortic root and ascending aorta.  His operative risk for open surgical aortic valve replacement would be fairly low although it is quite possible that he would require replacement of his aortic root and ascending aorta at the same time which would have a significantly higher risk at his age.  I reviewed the studies with him and his wife and the complexity of the situation.  I have answered all their questions at this point.  I decided to proceed with a gated cardiac CTA to get a better evaluation of his aortic root measurements and the feasibility of transcatheter aortic valve replacement.  We will also do a CTA of the chest, abdomen, and pelvis to aid in decision making.  Even if we decide not to proceed with valve replacement at this time I do not think is going to be too long before his gradient is into the severe  range.   Plan:  The patient will have a gated cardiac CTA  as well as a CTA of the chest, abdomen, and pelvis and then I will have him return for a second surgical opinion by Dr. Roxy Manns.  The patient and his wife understand that transcatheter aortic valve replacement may not be indicated or feasible in his case.  I spent 60 minutes performing this consultation and > 50% of this time was spent face to face counseling and coordinating the care of this patient's aortic stenosis.   Gaye Pollack, MD 08/22/2017

## 2017-08-28 ENCOUNTER — Ambulatory Visit: Payer: PPO | Admitting: Cardiology

## 2017-08-29 ENCOUNTER — Ambulatory Visit (HOSPITAL_COMMUNITY): Payer: PPO

## 2017-08-30 ENCOUNTER — Encounter: Payer: Self-pay | Admitting: *Deleted

## 2017-08-30 ENCOUNTER — Encounter: Payer: Self-pay | Admitting: Cardiology

## 2017-08-30 ENCOUNTER — Ambulatory Visit: Payer: PPO | Admitting: Cardiology

## 2017-08-30 VITALS — BP 122/64 | HR 72 | Ht 72.0 in | Wt 240.0 lb

## 2017-08-30 DIAGNOSIS — I350 Nonrheumatic aortic (valve) stenosis: Secondary | ICD-10-CM | POA: Diagnosis not present

## 2017-08-30 NOTE — Patient Instructions (Signed)
Medication Instructions:  Your physician recommends that you continue on your current medications as directed. Please refer to the Current Medication list given to you today.  Labwork: NONE  Testing/Procedures: NONE  Follow-Up: Your physician wants you to follow-up in: 6 months with Dr. Angelena Form. You will receive a reminder letter in the mail two months in advance. If you don't receive a letter, please call our office to schedule the follow-up appointment.   If you need a refill on your cardiac medications before your next appointment, please call your pharmacy.

## 2017-08-30 NOTE — Progress Notes (Signed)
08/30/2017 [name]   [birth year]  [id number]  Primary Physician Yong Channel Brayton Mars, MD Primary Cardiologist: Dr. Angelena Form  Reason for Visit/CC: Post R/LHC Follow-up  HPI:  Barry Decker is a 84 y.o. male who is being seen today for post cath f/u. He has severe aortic stenosis, mild CAD, dilated aortic root, HTN, and HLD. He has h/o rheumatic fever as a child with murmur noted his entire life. He has been monitored for several years and has developed severe AS and is symptomatic with exertional dyspnea. No chest pain, syncope or near syncope. He is currently undergoing w/u for possible TAVR. He had a R/LHC, performed by Dr. Angelena Form on 08/10/17 which showed mild non-obstructive disease in the LAD, Circumflex and RCA, (20% mild RCA, 20% prox LCx). The ostial 1st diag had moderate disease with 60% stenosis. Aortic valve measurements confirmed severe aortic stenosis (peak to peak gradient 33 mmHg, mean gradient 32 mmHg, AVR 1.37 cm2). Pt has additional studies pending for TAVR w/u. He is scheduled to have CT scans done next week and has f/u with Dr. Roxy Manns. His EF is normal. Echo in January showed EF of 60-65%.   Pt presents to clinic today with his wife. He denies any post cath complications. He has a 2+ right radial pulse. No hand pain, numbness or tingling. No new symptoms since his procedure. He still has expected exertional dyspnea, but no different from baseline. No chest pain, syncope/ near syncope. No LEE, orthopnea or PND. BP is well controlled at 122/64. HR in the 70s.   Cardiac Studies [facility] 08/10/17 Conclusion     Mid RCA lesion is 20% stenosed.  Prox LAD lesion is 20% stenosed.  Ost 1st Diag lesion is 60% stenosed.   1. Mild non-obstructive disease in the LAD, Circumflex and RCA\ 2. Moderate stenosis small caliber Diagonal branch 3. Severe aortic stenosis (peak to peak gradient 33 mmHg, mean gradient 32 mmHg, AVA 1.37 cm2).   Recommendations: Will continue workup for AVR. I  think we will likely end up pursuing TAVR but given his dilated aortic root, I want to have Dr. Cyndia Bent see him before we plan further testing.    2D Echo 07/12/17 Study Conclusions  - Left ventricle: The cavity size was mildly dilated. There was   moderate concentric hypertrophy. Systolic function was vigorous.   The estimated ejection fraction was in the range of 65% to 70%.   Wall motion was normal; there were no regional wall motion   abnormalities. There was an increased relative contribution of   atrial contraction to ventricular filling. Doppler parameters are   consistent with abnormal left ventricular relaxation (grade 1   diastolic dysfunction). Doppler parameters are consistent with   high ventricular filling pressure. - Aortic valve: Severely calcified annulus. Trileaflet; moderately   thickened, moderately calcified leaflets. Valve mobility was   mildly restricted. There was moderate stenosis. There was mild   regurgitation. Mean gradient (S): 33 mm Hg. Valve area (VTI):   1.77 cm^2. Valve area (Vmax): 1.86 cm^2. Valve area (Vmean): 1.78   cm^2. - Aorta: Aortic root dimension: 49 mm (ED). Ascending aortic   diameter: 49 mm (S). - Aortic root: The aortic root was moderately dilated. - Ascending aorta: The ascending aorta was moderately dilated. - Left atrium: The atrium was mildly dilated. - Pulmonary arteries: PA peak pressure: 41 mm Hg (S). - Impressions: Moderate LVH, mildly dilated LV, mild LAE, normal   LVH EF 60-65%, calcified AV with reduced leaflet  mobility and   moderate AS and mild AI. There is moderate dilatation of the   aortic root at the sinus of Vlalsalva and moderately dilated   ascending aorta. Consider chest CT angio or chest MRI/MRA for   further evaluation. COmpared to prior echo, the mean AV gradient   has not changed but the ascending aorta and aortic root diameters   have increased (aortic root 48>81mm and ascending aorta  44>29mm).  Impressions:  - Moderate LVH, mildly dilated LV, mild LAE, normal LVH EF 60-65%,   calcified AV with reduced leaflet mobility and moderate AS and   mild AI. There is moderate dilatation of the aortic root at the   sinus of Vlalsalva and moderately dilated ascending aorta.   Consider chest CT angio or chest MRI/MRA for further evaluation.   COmpared to prior echo, the mean AV gradient has not changed but   the ascending aorta and aortic root diameters have increased   (aortic root 48>30mm and ascending aorta 44>17mm). The right   ventricular systolic pressure was increased consistent with mild   pulmonary hypertension.   Current Meds  Medication Sig  . allopurinol (ZYLOPRIM) 300 MG tablet Take 1 tablet by mouth once daily (Patient taking differently: Take 300 mg by mouth once daily)  . amLODipine (NORVASC) 10 MG tablet Take 1 tablet by mouth daily (Patient taking differently: Take 10 mg by mouth once daily)  . aspirin 325 MG tablet Take 162.5 mg by mouth daily.  . furosemide (LASIX) 20 MG tablet Take 1 tablet (20 mg total) by mouth daily.  . IRON PO Take 1 tablet by mouth daily.   . potassium citrate (UROCIT-K) 10 MEQ (1080 MG) SR tablet Take 1 tablet (10 mEq total) by mouth 3 (three) times daily with meals. (Patient taking differently: Take 10 mEq by mouth daily. )  . quinapril (ACCUPRIL) 40 MG tablet Take 1 tablet by mouth daily (Patient taking differently: Take 40 mg by mouth daily)   No Known Allergies Past Medical History:  Diagnosis Date  . Aortic valve disease   . Arthritis    "spinal" (11/16/2015)  . Colon cancer ([place])    precancer-partial colectomy  . Elevated HDL   . Gout   . Heart murmur   . Hypertension   . Kidney stones   . Squamous cell skin cancer    Family History  Problem Relation Age of Onset  . Heart attack Mother 31       former smoker  . Heart attack Father 12       former smoker  . Hypertension Sister   . Heart attack Brother 26        Rheumatic fever   Past Surgical History:  Procedure Laterality Date  . BACK SURGERY    . CATARACT EXTRACTION W/ INTRAOCULAR LENS  IMPLANT, BILATERAL Bilateral 2017  . COLONOSCOPY W/ POLYPECTOMY    . FACET JOINT INJECTION  2009  . LITHOTRIPSY  2010  . LUMBAR LAMINECTOMY  ~ 1992  . PARTIAL COLECTOMY  1997   precancerours  . PARTIAL KNEE ARTHROPLASTY Left 11/16/2015   Procedure: LEFT KNEE UNICOMPARTMENTAL ;  Surgeon: [name], MD;  Location: [place];  Service: Orthopedics;  Laterality: Left;  . REPLACEMENT UNICONDYLAR JOINT KNEE Left 11/16/2015  . RIGHT/LEFT HEART CATH AND CORONARY ANGIOGRAPHY N/A 08/10/2017   Procedure: RIGHT/LEFT HEART CATH AND CORONARY ANGIOGRAPHY;  Surgeon: [name], MD;  Location: [place] [facility];  Service: Cardiovascular;  Laterality: N/A;  .  TONSILLECTOMY    . TYMPANOPLASTY Left 1998   ruptured ear drum with skin graft   Social History   Socioeconomic History  . Marital status: Married    Spouse name: Not on file  . Number of children: 5  . Years of education: Not on file  . Highest education level: Not on file  Social Needs  . Financial resource strain: Not on file  . Food insecurity - worry: Not on file  . Food insecurity - inability: Not on file  . Transportation needs - medical: Not on file  . Transportation needs - non-medical: Not on file  Occupational History  . Occupation: Government social research officer  Tobacco Use  . Smoking status: Former Smoker    Packs/day: 1.00    Years: 20.00    Pack years: 20.00    Types: Cigarettes    Last attempt to quit: 09/15/1980    Years since quitting: 36.9  . Smokeless tobacco: Never Used  Substance and Sexual Activity  . Alcohol use: Yes    Alcohol/week: 7.2 oz    Types: 10 Standard drinks or equivalent, 1 Glasses of wine, 1 Shots of liquor per week  . Drug use: No  . Sexual activity: No  Other Topics Concern  . Not on file  Social History Narrative   Married. 5 children. 4 grandkids. 1  greatgrandkid.       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       Hobbies: woodworking      Advised to consider advanced directives/hcpoa-thinks wife may have 70 from years ago.     Review of Systems: General: negative for chills, fever, night sweats or weight changes.  Cardiovascular: negative for chest pain, dyspnea on exertion, edema, orthopnea, palpitations, paroxysmal nocturnal dyspnea or shortness of breath Dermatological: negative for rash Respiratory: negative for cough or wheezing Urologic: negative for hematuria Abdominal: negative for nausea, vomiting, diarrhea, bright red blood per rectum, melena, or hematemesis Neurologic: negative for visual changes, syncope, or dizziness All other systems reviewed and are otherwise negative except as noted above.   Physical Exam:  Blood pressure 122/64, pulse 72, height 6' (1.829 m), weight 240 lb (108.9 kg), SpO2 94 %.  General appearance: alert, cooperative and no distress Neck: no adenopathy, no carotid bruit and no JVD Lungs: clear to auscultation bilaterally Heart: regular rate and rhythm and 3/36 SM at RUSB and along LSB Extremities: extremities normal, atraumatic, no cyanosis or edema Pulses: 2+ and symmetric Skin: Skin color, texture, turgor normal. No rashes or lesions Neurologic: Grossly normal  EKG not performed -- personally reviewed   ASSESSMENT AND PLAN:   1. Severe Aortic Stenosis: peak to peak gradient 33 mmHg, mean gradient 32 mmHg, AVA 1.37 cm2 by cath measurement. Symptomatic with exertional dyspnea affecting quality of life. Continue with TAVR w/u per valve team. He is scheduled for his CT scans and has office f/u with Dr. Roxy Manns.   2. CAD: mild-moderate nonobstructive CAD noted on recent cath.  Angiographic details outlined above. Continue medical therapy.   3. Dilated Aortic Root: scheduled for CT scan. Management per Dr. Cyndia Bent.   4. HTN: controlled on current regimen.   Follow-Up w/  Dr. Angelena Form in 6 months.   Brittainy Ladoris Gene, MHS [facility] 08/30/2017 12:13 PM

## 2017-09-03 ENCOUNTER — Ambulatory Visit (HOSPITAL_COMMUNITY)
Admission: RE | Admit: 2017-09-03 | Discharge: 2017-09-03 | Disposition: A | Discharge status: Home / Self Care | Payer: PPO | Source: Ambulatory Visit | Attending: Surgery | Admitting: Surgery

## 2017-09-03 ENCOUNTER — Encounter (HOSPITAL_COMMUNITY): Payer: Self-pay

## 2017-09-03 DIAGNOSIS — I350 Nonrheumatic aortic (valve) stenosis: Secondary | ICD-10-CM

## 2017-09-03 DIAGNOSIS — I700 Atherosclerosis of aorta: Secondary | ICD-10-CM | POA: Diagnosis not present

## 2017-09-03 DIAGNOSIS — I712 Thoracic aortic aneurysm, without rupture: Secondary | ICD-10-CM | POA: Insufficient documentation

## 2017-09-03 DIAGNOSIS — N200 Calculus of kidney: Secondary | ICD-10-CM | POA: Diagnosis not present

## 2017-09-03 MED ORDER — IOPAMIDOL (ISOVUE-370) INJECTION 76%
INTRAVENOUS | Status: AC
  Administered 2017-09-03: 100 mL
  Filled 2017-09-03: qty 100

## 2017-09-18 ENCOUNTER — Other Ambulatory Visit: Payer: Self-pay

## 2017-09-18 DIAGNOSIS — R0600 Dyspnea, unspecified: Secondary | ICD-10-CM

## 2017-09-18 DIAGNOSIS — R0609 Other forms of dyspnea: Secondary | ICD-10-CM

## 2017-09-18 DIAGNOSIS — I350 Nonrheumatic aortic (valve) stenosis: Secondary | ICD-10-CM

## 2017-09-19 ENCOUNTER — Other Ambulatory Visit: Payer: Self-pay

## 2017-09-19 ENCOUNTER — Telehealth: Payer: Self-pay | Admitting: Cardiovascular Disease

## 2017-09-19 ENCOUNTER — Institutional Professional Consult (permissible substitution): Payer: PPO | Admitting: Thoracic Surgery (Cardiothoracic Vascular Surgery)

## 2017-09-19 ENCOUNTER — Encounter: Payer: Self-pay | Admitting: Thoracic Surgery (Cardiothoracic Vascular Surgery)

## 2017-09-19 VITALS — BP 158/70 | HR 72 | Resp 16 | Ht 72.0 in | Wt 240.2 lb

## 2017-09-19 DIAGNOSIS — I350 Nonrheumatic aortic (valve) stenosis: Secondary | ICD-10-CM | POA: Diagnosis not present

## 2017-09-19 DIAGNOSIS — I5032 Chronic diastolic (congestive) heart failure: Secondary | ICD-10-CM | POA: Diagnosis not present

## 2017-09-19 NOTE — Telephone Encounter (Signed)
New message    Patient calling with questions about TAVR. Requesting call from nurse.

## 2017-09-19 NOTE — Patient Instructions (Addendum)
09/25/2017 TAVR Surgery  Arrive at [facility] in [place] at 10:30 AM for check-in  Continue taking all current medications without change through the day before surgery.  Have nothing to eat or drink after midnight the night before surgery.  On the morning of surgery take only allopurinol with a sip of water.    Additional TAVR Testing:  Your physician has requested Outpatient Physical Therapy evaluation.  This will be done at [facility] at [address] [place] [address].   This is scheduled on Thursday, September 20, 2017 at 8:00 AM  You are scheduled for pre TAVR testing on Friday 09/21/2017 at [facility] (Main Entrance A, Valet Parking). Please arrive at 7:45 am in Admitting for check in.   Marland Kitchen You are scheduled for pulmonary function tests (PFTs) at 8:00 am. . You are scheduled for Pre Admission Testing at 9:00 am . You are scheduled for an ultrasound of your neck (carotid dopplers) at 11:00 am.   Please contact Theodosia Quay RN at [phone] with any questions.

## 2017-09-19 NOTE — Telephone Encounter (Signed)
Pt advised of additional TAVR apts by phone.

## 2017-09-19 NOTE — Telephone Encounter (Signed)
I spoke with the pt and made him aware that I have availability on our TAVR surgery schedule 4/9 to do his procedure.  The pt would like to proceed.  I advised him to keep his apt with Dr Roxy Manns today and I will arrange additional testing needed for TAVR and contact him again by phone. Pt agreed with plan.

## 2017-09-19 NOTE — Progress Notes (Signed)
[facility]  MULTIDISCIPLINARY HEART VALVE CLINIC  CARDIOTHORACIC SURGERY CONSULTATION REPORT  Referring Provider is [name]* PCP is [name], MD  Chief Complaint  Patient presents with  . 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  The patient has history of rheumatic fever during childhood and states that he has been told he had a heart murmur most of his life.  He has been followed by Dr. Angelena Form since 2015.  Echocardiograms have documented the presence of normal left ventricular systolic function with moderate aortic stenosis and aortic insufficiency with mildly dilated aortic root.  Over the last several years the patient has developed progressive symptoms of exertional shortness of breath and fatigue.  Recent follow-up transthoracic echocardiogram performed July 12, 2017 revealed normal left ventricular systolic function with ejection fraction estimated 65-70%.  There was severe calcification of the aortic annulus with at least moderate aortic stenosis and regurgitation.  The aortic valve is trileaflet with thickened, moderately calcified leaflets.  Peak velocity across the aortic valve measured as high as 3.8 m/s corresponding to mean transvalvular gradient estimated 33 mmHg.  The DVI was reported 0.31.  There was aortic insufficiency with pressure halftime measured 470 ms.  The patient subsequently underwent diagnostic cardiac catheterization by Dr. Angelena Form on August 10, 2017.  He was found to have mild nonobstructive coronary artery disease.   Peak to peak and mean transvalvular gradients across the aortic valve were measured 33 and 32 mmHg respectively, corresponding to aortic valve area calculated 1.37 cm.  Right heart pressures were normal.  The patient underwent CT angiography and was evaluated by Dr. Cyndia Bent on August 22, 2017.  Transcatheter aortic valve replacement was discussed as an alternative to conventional surgery.  The patient has been referred for second surgical opinion.  Patient is married and lives locally in [place] with his wife.  He has been retired for nearly 20 years.  He has become progressively more sedentary over the past couple of years, primarily because of a slow gradual progression of symptoms of worsening fatigue and exertional shortness of breath.  The patient now gets short of breath with moderate and low-level activity.  He has had occasional episodes of orthopnea.  He denies any history of resting shortness of breath, PND, dizzy spells, palpitations, or syncope.  He denies any history of exertional chest pain or chest tightness.  His mobility and physical activity is limited primarily by exertional shortness of breath and fatigue.  Past Medical History:  Diagnosis Date  . Aortic valve disease   . Arthritis    "spinal" (11/16/2015)  . Colon cancer ([place])    precancer-partial colectomy  . Elevated HDL   . Gout   . Heart murmur   . Hypertension   . Kidney stones   . Squamous cell skin cancer     Past Surgical History:  Procedure Laterality Date  . BACK SURGERY    . CATARACT EXTRACTION W/ INTRAOCULAR LENS  IMPLANT, BILATERAL Bilateral 2017  . COLONOSCOPY W/ POLYPECTOMY    . FACET JOINT INJECTION  2009  . LITHOTRIPSY  2010  . LUMBAR LAMINECTOMY  ~ 1992  .  PARTIAL COLECTOMY  1997   precancerours  . PARTIAL KNEE ARTHROPLASTY Left 11/16/2015   Procedure: LEFT KNEE UNICOMPARTMENTAL ;  Surgeon: [name], MD;  Location: [facility];  Service: Orthopedics;  Laterality: Left;  . REPLACEMENT UNICONDYLAR  JOINT KNEE Left 11/16/2015  . RIGHT/LEFT HEART CATH AND CORONARY ANGIOGRAPHY N/A 08/10/2017   Procedure: RIGHT/LEFT HEART CATH AND CORONARY ANGIOGRAPHY;  Surgeon: [name], MD;  Location: [facility];  Service: Cardiovascular;  Laterality: N/A;  . TONSILLECTOMY    . TYMPANOPLASTY Left 1998   ruptured ear drum with skin graft    Family History  Problem Relation Age of Onset  . Heart attack Mother 25       former smoker  . Heart attack Father 3       former smoker  . Hypertension Sister   . Heart attack Brother 26       Rheumatic fever    Social History   Socioeconomic History  . Marital status: Married    Spouse name: Not on file  . Number of children: 5  . Years of education: Not on file  . Highest education level: Not on file  Occupational History  . Occupation: Government social research officer  Social Needs  . Financial resource strain: Not on file  . Food insecurity:    Worry: Not on file    Inability: Not on file  . Transportation needs:    Medical: Not on file    Non-medical: Not on file  Tobacco Use  . Smoking status: Former Smoker    Packs/day: 1.00    Years: 20.00    Pack years: 20.00    Types: Cigarettes    Last attempt to quit: 09/15/1980    Years since quitting: 37.0  . Smokeless tobacco: Never Used  Substance and Sexual Activity  . Alcohol use: Yes    Alcohol/week: 7.2 oz    Types: 10 Standard drinks or equivalent, 1 Glasses of wine, 1 Shots of liquor per week  . Drug use: No  . Sexual activity: Never  Lifestyle  . Physical activity:    Days per week: Not on file    Minutes per session: Not on file  . Stress: Not on file  Relationships  . Social connections:    Talks on phone: Not on file    Gets together: Not on file    Attends religious service: Not on file    Active member of club or organization: Not on file    Attends meetings of clubs or organizations: Not on file    Relationship status: Not on file  . Intimate partner  violence:    Fear of current or ex partner: Not on file    Emotionally abused: Not on file    Physically abused: Not on file    Forced sexual activity: Not on file  Other Topics Concern  . Not on file  Social History Narrative   Married. 5 children. 4 grandkids. 1 greatgrandkid.       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       Hobbies: woodworking      Advised to consider advanced directives/hcpoa-thinks wife may have 58 from years ago.    Current Outpatient Medications  Medication Sig Dispense Refill  . allopurinol (ZYLOPRIM) 300 MG tablet Take 1 tablet by mouth once daily (Patient taking differently: Take 300 mg by mouth once daily) 90 tablet 1  . amLODipine (NORVASC) 10 MG tablet Take 1 tablet by  mouth daily (Patient taking differently: Take 10 mg by mouth once daily) 90 tablet 2  . aspirin 325 MG tablet Take 162.5 mg by mouth daily.    . furosemide (LASIX) 20 MG tablet Take 1 tablet (20 mg total) by mouth daily. 90 tablet 2  . IRON PO Take 65 mg/day by mouth daily.     . potassium citrate (UROCIT-K) 10 MEQ (1080 MG) SR tablet Take 1 tablet (10 mEq total) by mouth 3 (three) times daily with meals. (Patient taking differently: Take 10 mEq by mouth 2 (two) times daily. ) 300 tablet 1  . quinapril (ACCUPRIL) 40 MG tablet Take 1 tablet by mouth daily (Patient taking differently: Take 40 mg by mouth daily) 90 tablet 1   No current facility-administered medications for this visit.     No Known Allergies    Review of Systems:   General:  normal appetite, decreased energy, no weight gain, no weight loss, no fever  Cardiac:  no chest pain with exertion, no chest pain at rest, + SOB with exertion, no resting SOB, no PND, + orthopnea, no palpitations, no arrhythmia, no atrial fibrillation, + LE edema, no dizzy spells, no syncope  Respiratory:  + shortness of breath, no home oxygen, no productive cough, no dry cough, no bronchitis, no wheezing, no hemoptysis, no  asthma, no pain with inspiration or cough, no sleep apnea, no CPAP at night  GI:   no difficulty swallowing, no reflux, no frequent heartburn, no hiatal hernia, no abdominal pain, no constipation, no diarrhea, no hematochezia, no hematemesis, no melena  GU:   no dysuria,  no frequency, no urinary tract infection, no hematuria, no enlarged prostate, + kidney stones, no kidney disease  Vascular:  no pain suggestive of claudication, no pain in feet, no leg cramps, no varicose veins, no DVT, no non-healing foot ulcer  Neuro:   no stroke, no TIA's, no seizures, no headaches, no temporary blindness one eye,  no slurred speech, no peripheral neuropathy, no chronic pain, no instability of gait, no memory/cognitive dysfunction  Musculoskeletal: + arthritis, no joint swelling, no myalgias, no difficulty walking, normal mobility   Skin:   no rash, no itching, no skin infections, no pressure sores or ulcerations  Psych:   no anxiety, no depression, no nervousness, no unusual recent stress  Eyes:   no blurry vision, no floaters, no recent vision changes, does not wears glasses or contacts  ENT:   + hearing loss, no loose or painful teeth, no dentures, last saw dentist within the past year  Hematologic:  no easy bruising, no abnormal bleeding, no clotting disorder, no frequent epistaxis  Endocrine:  no diabetes, does not check CBG's at home           Physical Exam:   BP (!) 158/70 (BP Location: Right Arm, Patient Position: Sitting, Cuff Size: Large)   Pulse 72   Resp 16   Ht 6' (1.829 m)   Wt 240 lb 3.2 oz (109 kg)   SpO2 95% Comment: ON RA  BMI 32.58 kg/m   General:  Moderately obese,  well-appearing  HEENT:  Unremarkable   Neck:   no JVD, no bruits, no adenopathy   Chest:   clear to auscultation, symmetrical breath sounds, no wheezes, no rhonchi   CV:   RRR, grade III/VI crescendo/decrescendo murmur heard best at RSB,  no diastolic murmur  Abdomen:  soft, non-tender, no masses    Extremities:  warm, well-perfused, pulses diminished but palpable, no LE  edema  Rectal/GU  Deferred  Neuro:   Grossly non-focal and symmetrical throughout  Skin:   Clean and dry, no rashes, no breakdown   Diagnostic Tests:  Transthoracic Echocardiography  Patient:    Kanden, Carey MR #:       [id number] Study Date: 07/12/2017 Gender:     M Age:        7 Height:     182.9 cm Weight:     107 kg BSA:        2.36 m^2 Pt. Status: Room:   ATTENDING    Fransico Him, MD  Willia Craze, Christopher  REFERRING    [place], [place], Outpatient  SONOGRAPHER  [facility], RDCS  cc:  ------------------------------------------------------------------- LV EF: 65% -   70%  ------------------------------------------------------------------- Indications:      Aortic Stenosis (I35.0).  ------------------------------------------------------------------- History:   PMH:  History of Moderate Aortic Stenosis (prior mean gradient 49mmHG, 10-23-16)  Dyspnea and murmur.  Congestive heart failure.  Aortic valve disease.  Risk factors:  Family history of coronary artery disease. Former tobacco use. Hypertension. Dyslipidemia.  ------------------------------------------------------------------- Study Conclusions  - Left ventricle: The cavity size was mildly dilated. There was   moderate concentric hypertrophy. Systolic function was vigorous.   The estimated ejection fraction was in the range of 65% to 70%.   Wall motion was normal; there were no regional wall motion   abnormalities. There was an increased relative contribution of   atrial contraction to ventricular filling. Doppler parameters are   consistent with abnormal left ventricular relaxation (grade 1   diastolic dysfunction). Doppler parameters are consistent with   high ventricular filling pressure. - Aortic valve: Severely calcified annulus. Trileaflet; moderately   thickened,  moderately calcified leaflets. Valve mobility was   mildly restricted. There was moderate stenosis. There was mild   regurgitation. Mean gradient (S): 33 mm Hg. Valve area (VTI):   1.77 cm^2. Valve area (Vmax): 1.86 cm^2. Valve area (Vmean): 1.78   cm^2. - Aorta: Aortic root dimension: 49 mm (ED). Ascending aortic   diameter: 49 mm (S). - Aortic root: The aortic root was moderately dilated. - Ascending aorta: The ascending aorta was moderately dilated. - Left atrium: The atrium was mildly dilated. - Pulmonary arteries: PA peak pressure: 41 mm Hg (S). - Impressions: Moderate LVH, mildly dilated LV, mild LAE, normal   LVH EF 60-65%, calcified AV with reduced leaflet mobility and   moderate AS and mild AI. There is moderate dilatation of the   aortic root at the sinus of Vlalsalva and moderately dilated   ascending aorta. Consider chest CT angio or chest MRI/MRA for   further evaluation. COmpared to prior echo, the mean AV gradient   has not changed but the ascending aorta and aortic root diameters   have increased (aortic root 48>12mm and ascending aorta 44>54mm).  Impressions:  - Moderate LVH, mildly dilated LV, mild LAE, normal LVH EF 60-65%,   calcified AV with reduced leaflet mobility and moderate AS and   mild AI. There is moderate dilatation of the aortic root at the   sinus of Vlalsalva and moderately dilated ascending aorta.   Consider chest CT angio or chest MRI/MRA for further evaluation.   COmpared to prior echo, the mean AV gradient has not changed but   the ascending aorta and aortic root diameters have increased   (aortic root 48>49mm and ascending aorta 44>44mm). The right   ventricular systolic pressure  was increased consistent with mild   pulmonary hypertension.  ------------------------------------------------------------------- Study data:  Comparison was made to the study of 10/23/2016.  Study status:  Routine.  Procedure:  Transthoracic  echocardiography. Image quality was adequate.          Transthoracic echocardiography.  M-mode, complete 2D, spectral Doppler, and color Doppler.  Birthdate:  Patient birthdate: 1932/08/16.  Age:  Patient is 84 yr old.  Sex:  Gender: male.    BMI: 32 kg/m^2.  Blood pressure:     136/70  Patient status:  Outpatient.  Study date: Study date: 07/12/2017. Study time: 09:39 AM.  Location:  [facility]  -------------------------------------------------------------------  ------------------------------------------------------------------- Left ventricle:  The cavity size was mildly dilated. There was moderate concentric hypertrophy. Systolic function was vigorous. The estimated ejection fraction was in the range of 65% to 70%. Wall motion was normal; there were no regional wall motion abnormalities. There was an increased relative contribution of atrial contraction to ventricular filling. Doppler parameters are consistent with abnormal left ventricular relaxation (grade 1 diastolic dysfunction). Doppler parameters are consistent with high ventricular filling pressure.  ------------------------------------------------------------------- Aortic valve:   Severely calcified annulus. Trileaflet; moderately thickened, moderately calcified leaflets. Valve mobility was mildly restricted.  Doppler:   There was moderate stenosis.   There was mild regurgitation.    VTI ratio of LVOT to aortic valve: 0.31. Valve area (VTI): 1.77 cm^2. Indexed valve area (VTI): 0.75 cm^2/m^2. Peak velocity ratio of LVOT to aortic valve: 0.32. Valve area (Vmax): 1.86 cm^2. Indexed valve area (Vmax): 0.79 cm^2/m^2. Mean velocity ratio of LVOT to aortic valve: 0.31. Valve area (Vmean): 1.78 cm^2. Indexed valve area (Vmean): 0.75 cm^2/m^2. Mean gradient (S): 33 mm Hg. Peak gradient (S): 57 mm Hg.  ------------------------------------------------------------------- Aorta:  Aortic root: The aortic root was  moderately dilated. Ascending aorta: The ascending aorta was moderately dilated.  ------------------------------------------------------------------- Mitral valve:   Structurally normal valve.   Mobility was not restricted.  Doppler:  Transvalvular velocity was within the normal range. There was no evidence for stenosis. There was no regurgitation.    Peak gradient (D): 3 mm Hg.  ------------------------------------------------------------------- Left atrium:  The atrium was mildly dilated.  ------------------------------------------------------------------- Right ventricle:  The cavity size was normal. Wall thickness was normal. Systolic function was normal.  ------------------------------------------------------------------- Pulmonic valve:    Structurally normal valve.   Cusp separation was normal.  Doppler:  Transvalvular velocity was within the normal range. There was no evidence for stenosis. There was no regurgitation.  ------------------------------------------------------------------- Tricuspid valve:   Structurally normal valve.    Doppler: Transvalvular velocity was within the normal range. There was mild regurgitation.  ------------------------------------------------------------------- Pulmonary artery:   The main pulmonary artery was normal-sized. Systolic pressure was within the normal range.  ------------------------------------------------------------------- Right atrium:  The atrium was normal in size.  ------------------------------------------------------------------- Pericardium:  There was no pericardial effusion.  ------------------------------------------------------------------- Systemic veins: Inferior vena cava: The vessel was normal in size.  ------------------------------------------------------------------- Measurements   Left ventricle                           Value          Reference  LV ID, ED, PLAX chordal          (H)     53     mm       43 - 52  LV ID, ES, PLAX chordal  29.2  mm       23 - 38  LV fx shortening, PLAX chordal           45    %        >=29  LV PW thickness, ED                      13.1  mm       ----------  IVS/LV PW ratio, ED                      0.98           <=1.3  Stroke volume, 2D                        162   ml       ----------  Stroke volume/bsa, 2D                    69    ml/m^2   ----------  LV e&', lateral                           5.11  cm/s     ----------  LV E/e&', lateral                         16.26          ----------  LV e&', medial                            4.9   cm/s     ----------  LV E/e&', medial                          16.96          ----------  LV e&', average                           5.01  cm/s     ----------  LV E/e&', average                         16.6           ----------    Ventricular septum                       Value          Reference  IVS thickness, ED                        12.9  mm       ----------    LVOT                                     Value          Reference  LVOT ID, S                               27    mm       ----------  LVOT area  5.73  cm^2     ----------  LVOT peak velocity, S                    122   cm/s     ----------  LVOT mean velocity, S                    83    cm/s     ----------  LVOT VTI, S                              28.2  cm       ----------  LVOT peak gradient, S                    6     mm Hg    ----------    Aortic valve                             Value          Reference  Aortic valve peak velocity, S            376   cm/s     ----------  Aortic valve mean velocity, S            267   cm/s     ----------  Aortic valve VTI, S                      91.3  cm       ----------  Aortic mean gradient, S                  33    mm Hg    ----------  Aortic peak gradient, S                  57    mm Hg    ----------  VTI ratio, LVOT/AV                       0.31            ----------  Aortic valve area, VTI                   1.77  cm^2     ----------  Aortic valve area/bsa, VTI               0.75  cm^2/m^2 ----------  Velocity ratio, peak, LVOT/AV            0.32           ----------  Aortic valve area, peak velocity         1.86  cm^2     ----------  Aortic valve area/bsa, peak              0.79  cm^2/m^2 ----------  velocity  Velocity ratio, mean, LVOT/AV            0.31           ----------  Aortic valve area, mean velocity         1.78  cm^2     ----------  Aortic valve area/bsa, mean              0.75  cm^2/m^2 ----------  velocity  Aortic regurg pressure half-time  470   ms       ----------    Aorta                                    Value          Reference  Aortic root ID, ED                       49    mm       ----------  Ascending aorta ID, A-P, S               49    mm       ----------    Left atrium                              Value          Reference  LA ID, A-P, ES                           51    mm       ----------  LA ID/bsa, A-P                           2.16  cm/m^2   <=2.2  LA volume, S                             63.2  ml       ----------  LA volume/bsa, S                         26.8  ml/m^2   ----------  LA volume, ES, 1-p A4C                   45.7  ml       ----------  LA volume/bsa, ES, 1-p A4C               19.4  ml/m^2   ----------  LA volume, ES, 1-p A2C                   72.4  ml       ----------  LA volume/bsa, ES, 1-p A2C               30.7  ml/m^2   ----------    Mitral valve                             Value          Reference  Mitral E-wave peak velocity              83.1  cm/s     ----------  Mitral A-wave peak velocity              135   cm/s     ----------  Mitral deceleration time         (H)     342   ms       150 - 230  Mitral peak gradient, D  3     mm Hg    ----------  Mitral E/A ratio, peak                   0.6            ----------    Pulmonary arteries                       Value           Reference  PA pressure, S, DP               (H)     41    mm Hg    <=30    Tricuspid valve                          Value          Reference  Tricuspid regurg peak velocity           307   cm/s     ----------  Tricuspid peak RV-RA gradient            38    mm Hg    ----------    Right atrium                             Value          Reference  RA ID, S-I, ES, A4C              (H)     56.5  mm       34 - 49  RA area, ES, A4C                         15.4  cm^2     8.3 - 19.5  RA volume, ES, A/L                       34.9  ml       ----------  RA volume/bsa, ES, A/L                   14.8  ml/m^2   ----------    Right ventricle                          Value          Reference  TAPSE                                    29.6  mm       ----------  RV s&', lateral, S                        20.3  cm/s     ----------  Legend: (L)  and  (H)  mark values outside specified reference range.  ------------------------------------------------------------------- Prepared and Electronically Authenticated by  Fransico Him, MD 2019-01-24T10:47:10   RIGHT/LEFT HEART CATH AND CORONARY ANGIOGRAPHY  Conclusion     Mid RCA lesion is 20% stenosed.  Prox LAD lesion is 20% stenosed.  Ost 1st Diag lesion is 60% stenosed.   1. Mild non-obstructive disease in the LAD, Circumflex and RCA\ 2. Moderate stenosis small caliber Diagonal branch  3. Severe aortic stenosis (peak to peak gradient 33 mmHg, mean gradient 32 mmHg, AVA 1.37 cm2).   Recommendations: Will continue workup for AVR. I think we will likely end up pursuing TAVR but given his dilated aortic root, I want to have Dr. Cyndia Bent see him before we plan further testing.    Indications   Severe aortic stenosis [I35.0 (ICD-10-CM)]  Procedural Details/Technique   Technical Details Indication: 84 yo male with thoracic aortic aneurysm and severe AS.   Procedure: The risks, benefits, complications, treatment options, and expected  outcomes were discussed with the patient. The patient and/or family concurred with the proposed plan, giving informed consent. The patient was sedated with Versed and Fentanyl. I changed the antecubital IV in the right antecubital vein for a 5 French sheath. The right wrist was prepped and draped in a sterile fashion. 1% lidocaine was used for local anesthesia. Using the modified Seldinger access technique, a 5 French sheath was placed in the right radial artery. 3 mg Verapamil was given through the sheath. 5500 units IV heparin was given. Standard diagnostic catheters were used to perform selective coronary angiography. I crossed the the aortic valve with an AL-1 catheter and a straight wire. LV pressures measured. No left ventricular angiogram was performed. The sheath was removed from the right radial artery and a Terumo hemostasis band was applied at the arteriotomy site on the right wrist.     Estimated blood loss <50 mL.  During this procedure the patient was administered the following to achieve and maintain moderate conscious sedation: Versed 1 mg, Fentanyl 25 mcg, while the patient's heart rate, blood pressure, and oxygen saturation were continuously monitored. The period of conscious sedation was 38 minutes, of which I was present face-to-face 100% of this time.  Complications   Complications documented before study signed (08/10/2017 3:16 PM EST)    RIGHT/LEFT HEART CATH AND CORONARY ANGIOGRAPHY   None Documented by [name], MD 08/10/2017 3:15 PM EST  Time Range: Intraprocedure      Coronary Findings   Diagnostic  Dominance: Right  Left Anterior Descending  Prox LAD lesion 20% stenosed  Prox LAD lesion is 20% stenosed.  First Diagonal Branch  Ost 1st Diag lesion 60% stenosed  Ost 1st Diag lesion is 60% stenosed.  Right Coronary Artery  Vessel is large.  Mid RCA lesion 20% stenosed  Mid RCA lesion is 20% stenosed.  Intervention   No interventions have been  documented.  Coronary Diagrams   Diagnostic Diagram       Implants    No implant documentation for this case.  MERGE Images   Show images for CARDIAC CATHETERIZATION   Link to Procedure Log   Procedure Log    Hemo Data    Most Recent Value  Fick Cardiac Output 7.67 L/min  Fick Cardiac Output Index 3.35 (L/min)/BSA  Aortic Mean Gradient 32 mmHg  Aortic Peak Gradient 33 mmHg  Aortic Valve Area 1.37  Aortic Value Area Index 0.6 cm2/BSA  RA A Wave 7 mmHg  RA V Wave 6 mmHg  RA Mean 4 mmHg  RV Systolic Pressure 29 mmHg  RV Diastolic Pressure 0 mmHg  RV EDP 6 mmHg  PA Systolic Pressure 23 mmHg  PA Diastolic Pressure 3 mmHg  PA Mean 11 mmHg  PW A Wave 6 mmHg  PW V Wave 5 mmHg  PW Mean 4 mmHg  AO Systolic Pressure 322 mmHg  AO Diastolic Pressure 62 mmHg  AO Mean 92 mmHg  LV Systolic Pressure 846 mmHg  LV Diastolic Pressure 11 mmHg  LV EDP 14 mmHg  Arterial Occlusion Pressure Extended Systolic Pressure 962 mmHg  Arterial Occlusion Pressure Extended Diastolic Pressure 64 mmHg  Arterial Occlusion Pressure Extended Mean Pressure 94 mmHg  Left Ventricular Apex Extended Systolic Pressure 952 mmHg  Left Ventricular Apex Extended Diastolic Pressure 9 mmHg  Left Ventricular Apex Extended EDP Pressure 16 mmHg  QP/QS 1  TPVR Index 3.29 HRUI  TSVR Index 27.46 HRUI  PVR SVR Ratio 0.08  TPVR/TSVR Ratio 0.12     Cardiac TAVR CT  TECHNIQUE: The patient was scanned on a [employer]. A 120 kV retrospective scan was triggered in the descending thoracic aorta at 111 HU's. Gantry rotation speed was 250 msecs and collimation was .6 mm. No beta blockade or nitro were given. The 3D data set was reconstructed in 5% intervals of the R-R cycle. Systolic and diastolic phases were analyzed on a dedicated work station using MPR, MIP and VRT modes. The patient received 80 cc of contrast.  FINDINGS: Aortic Valve: Trileaflet aortic valve with moderately thickened  and calcified leaflets and severely restricted leaflet opening. No calcifications are extending into the LVOT.  Aorta: There is moderate ascending aortic aneurysm and also dilated aortic arch and descending thoracic aorta. Mild calcifications, no dissection.  Sinotubular Junction: 41 x 40 mm  Ascending Thoracic Aorta: 47 x 45 mm  Aortic Arch: 37 x 36 mm  Descending Thoracic Aorta: 36 x 34 mm  Sinus of Valsalva Measurements:  Non-coronary: 46 mm  Right -coronary: 44 mm  Left -coronary: 45 mm  Sinus of Valsalva Height:  Right -coronary: 22 mm  Left -coronary: 21 mm  Coronary Artery Height above Annulus:  Left Main: 14 mm  Right Coronary: 14 mm  Virtual Basal Annulus Measurements:  Maximum/Minimum Diameter: 32.7 x 25 mm  Mean Diameter: 28.6 mm  Perimeter: 92.4 mm  Area: 644 mm2  Optimum Fluoroscopic Angle for Delivery: LAO 13 CAU 6  IMPRESSION: 1. Trileaflet aortic valve with moderately thickened and calcified leaflets and severely restricted leaflet opening. Annular measurements suitable for delivery of a 29 mm Edwards SAPIEN 3 valve or a 34 mm Medtronic Evolut R TAVR valve.  2. Sufficient coronary to annulus distance.  3. Optimum Fluoroscopic Angle for Delivery:  LAO 13 CAU 6  4. No thrombus in the left atrial appendage.  5. There is moderately dilated ascending aortic aneurysm as well as mildly dilated aortic arch and descending thoracic aorta.  6. Dilated pulmonary artery measuring 36 mm suggestive of pulmonary hypertension.   Electronically Signed   By: Ena Dawley   On: 09/05/2017 09:37   CT ANGIOGRAPHY ABDOMEN AND PELVIS WITH CONTRAST AND WITHOUT CONTRAST  TECHNIQUE: Multidetector CT imaging of the abdomen and pelvis was performed using the standard protocol during bolus administration of intravenous contrast. Multiplanar reconstructed images and MIPs were obtained and reviewed to evaluate the  vascular anatomy.  CONTRAST:  153mL ISOVUE-370 IOPAMIDOL (ISOVUE-370) INJECTION 76%  COMPARISON:  CT the abdomen and pelvis 03/09/2016.  FINDINGS: CTA ABDOMEN AND PELVIS FINDINGS  Lower thorax: Atherosclerotic calcifications in the left main, left anterior descending and right coronary arteries. Severe thickening calcification of the aortic valve.  Hepatobiliary: No suspicious cystic or solid hepatic lesions. No intra or extrahepatic biliary ductal dilatation. Gallbladder is normal in appearance.  Pancreas: No pancreatic mass. No pancreatic ductal dilatation. No pancreatic or peripancreatic fluid or inflammatory changes.  Spleen: Unremarkable.  Adrenals/Urinary Tract: Multiple nonobstructive calculi  are noted within the collecting systems of both kidneys, the largest of which measures 8 mm in the interpolar region of the left kidney. Exophytic 2.9 cm low-attenuation lesion in the lower pole of the left kidney, compatible with a simple cyst. Right kidney and bilateral adrenal glands are normal in appearance. No hydroureteronephrosis. Urinary bladder is normal in appearance.  Stomach/Bowel: The appearance of the stomach is normal. No pathologic dilatation of small bowel or colon. Status post right hemicolectomy.  Vascular/Lymphatic: Aortic atherosclerosis, with vascular findings and measurements pertinent to potential TAVR procedure, as detailed below. No aneurysm or dissection noted in the abdominal or pelvic vasculature. Celiac axis, superior mesenteric artery and inferior mesenteric artery are all widely patent without hemodynamically significant stenosis. Single right and 2 left renal arteries are all widely patent without hemodynamically significant stenosis. No lymphadenopathy noted in the abdomen or pelvis.  Reproductive: Prostate gland and seminal vesicles are unremarkable in appearance.  Other: No significant volume of ascites.  No  pneumoperitoneum.  Musculoskeletal: There are no aggressive appearing lytic or blastic lesions noted in the visualized portions of the skeleton.  VASCULAR MEASUREMENTS PERTINENT TO TAVR:  AORTA:  Minimal Aortic Diameter-24 x 24 mm  Severity of Aortic Calcification-moderate  RIGHT PELVIS:  Right Common Iliac Artery -  Minimal Diameter-13.0 x 12.2 mm  Tortuosity-mild  Calcification-moderate  Right External Iliac Artery -  Minimal Diameter-11.6 x 10.1 mm  Tortuosity-moderate  Calcification-mild  Right Common Femoral Artery -  Minimal Diameter-11.5 x 10.8 mm  Tortuosity-mild  Calcification-mild  LEFT PELVIS:  Left Common Iliac Artery -  Minimal Diameter-11.2 x 13.1 mm  Tortuosity-mild  Calcification-moderate  Left External Iliac Artery -  Minimal Diameter-11.0 x 10.8 mm  Tortuosity-moderate  Calcification-mild  Left Common Femoral Artery -  Minimal Diameter-10.3 x 11.2 mm  Tortuosity-mild  Calcification-mild  Review of the MIP images confirms the above findings.  IMPRESSION: 1. Vascular findings and measurements pertinent to potential TAVR procedure, as detailed above. 2. Severe thickening calcification of the aortic valve, compatible with the reported clinical history of severe aortic stenosis. 3. Multiple nonobstructive calculi in the left renal collecting system measuring up to 8 mm in the interpolar region. 4. Additional incidental findings, as above.   Electronically Signed   By: Vinnie Langton M.D.   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  He presents with worsening symptoms of exertional shortness of breath, fatigue, and lower extremity edema consistent with chronic diastolic congestive heart failure, New York Heart Association functional class IIb.  I have personally reviewed the patient's  recent transthoracic echocardiogram, diagnostic cardiac catheterization, and CT angiograms.  Echocardiogram reveals a trileaflet aortic valve with severe annular thickening and calcification with severe leaflet restriction involving all 3 leaflets of the aortic valve.  Gross anatomic characteristics are suggestive of rheumatic disease with additional degenerative calcification.  Peak velocity across the aortic valve measured 3.8 m/s corresponding to mean transvalvular gradient estimated 33 mmHg.  The patient also has at least moderate aortic insufficiency with pressure halftime reported 470 ms.  Left ventricular systolic function remains normal.  I agree the patient needs aortic valve replacement.  Diagnostic cardiac catheterization confirmed the presence of aortic stenosis and was notable for the absence of significant coronary artery disease.  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  However, cardiac gated CT angiogram reveals most accurate sizing information, and the maximum  transverse diameter of the ascending aorta was only 4.7 cm.  The patient does not have a sinus of Valsalva aneurysm and the overall dimensions of the aortic root are well below 5.0 cm.  Include conventional surgical aortic valve replacement and transcatheter aortic valve replacement.  Risks associated with conventional surgery would be at least moderately elevated because of the patient's age and likely need for replacement of the ascending thoracic aorta and possibly the aortic root.  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  Cardiac-gated CTA of the heart reveals anatomical characteristics consistent with aortic stenosis suitable for treatment by transcatheter aortic valve replacement  without any significant complicating features and CTA of the aorta and iliac vessels demonstrate what appears to be adequate pelvic vascular access to facilitate a transfemoral approach.   Under the circumstances I agree that transcatheter aortic valve replacement seems a reasonable alternative to conventional surgery.   Plan:  The patient and his wife were counseled at length regarding treatment alternatives for management of severe symptomatic aortic stenosis with aortic insufficiency. Alternative approaches such as conventional aortic valve replacement with or without replacement of his ascending thoracic aorta, transcatheter aortic valve replacement, and continued medical therapy without intervention were compared and contrasted at length.  The risks associated with conventional surgical aortic valve replacement were discussed in detail, as were expectations for post-operative convalescence.  Issues specific to transcatheter aortic valve replacement were discussed including questions about long term valve durability, the implied expectant management of the mild aneurysmal enlargement of the ascending thoracic aorta, the potential for paravalvular leak, possible increased risk of need for permanent pacemaker placement, and other technical complications related to the procedure itself.  Long-term prognosis with medical therapy was discussed. This discussion was placed in the context of the patient's own specific clinical presentation and past medical history.  All of their questions have been addressed.  The patient desires to proceed with transcatheter aortic valve replacement as soon as possible.  Surgery has tentatively been planned for September 25, 2017.  Following the decision to proceed with transcatheter aortic valve replacement, a discussion has been held regarding what types of management strategies would be attempted intraoperatively in the event of life-threatening complications, including whether  or not the patient would be considered a candidate for the use of cardiopulmonary bypass and/or conversion to open sternotomy for attempted surgical intervention.  The patient has been advised of a variety of complications that might develop including but not limited to risks of death, stroke, paravalvular leak, aortic dissection or other major vascular complications, aortic annulus rupture, device embolization, cardiac rupture or perforation, mitral regurgitation, acute myocardial infarction, arrhythmia, heart block or bradycardia requiring permanent pacemaker placement, congestive heart failure, respiratory failure, renal failure, pneumonia, infection, other late complications related to structural valve deterioration or migration, or other complications that might ultimately cause a temporary or permanent loss of functional independence or other long term morbidity.  The patient provides full informed consent for the procedure as described and all questions were answered.   I spent in excess of 90 minutes during the conduct of this office consultation and >50% of this time involved direct face-to-face encounter with the patient for counseling and/or coordination of their care.      Valentina Gu. Roxy Manns, MD 09/19/2017 4:22 PM

## 2017-09-20 ENCOUNTER — Ambulatory Visit: Payer: PPO | Attending: Cardiovascular Disease | Admitting: Physical Therapy

## 2017-09-20 ENCOUNTER — Other Ambulatory Visit: Payer: Self-pay

## 2017-09-20 ENCOUNTER — Encounter: Payer: Self-pay | Admitting: Physical Therapy

## 2017-09-20 DIAGNOSIS — R2689 Other abnormalities of gait and mobility: Secondary | ICD-10-CM | POA: Diagnosis not present

## 2017-09-20 DIAGNOSIS — I350 Nonrheumatic aortic (valve) stenosis: Secondary | ICD-10-CM

## 2017-09-20 NOTE — Pre-Procedure Instructions (Addendum)
Barry Decker  09/20/2017      [facility] - [address], [place] [address] Phone: [phone] Fax: [phone]  [id number] - [place], [place]. AT [address] [place] [address] Phone: [phone] Fax: [phone]  [facility] [place], [place] [address] Phone: [phone] Fax: [phone]    Your procedure is scheduled on Tuesday April 9.  Report to [facility] Admitting at 10:30 A.M.  Call this number if you have problems the morning of surgery:  [phone]   Remember:  Do not eat food or drink liquids after midnight.   Continue taking all medication as prescribed until the day before surgery  Take these medicines the morning of surgery with A SIP OF WATER: Allopurinol (Zyloprim)  7 days prior to surgery STOP taking any Aleve, Naproxen, Ibuprofen, Motrin, Advil, Goody's, BC's, all herbal medications, fish oil, and all vitamins    Do not wear jewelry, make-up or nail polish.  Do not wear lotions, powders, or perfumes, or deodorant.  Do not shave 48 hours prior to surgery.  Men may shave face and neck.  Do not bring valuables to the hospital.  [facility] is not responsible for any belongings or valuables.  Contacts, dentures or bridgework may not be worn into surgery.  Leave your suitcase in the car.  After surgery it may be brought to your room.  For patients admitted to the hospital, discharge time will be determined by your treatment team.  Patients discharged the day of surgery will not be allowed to drive home.   Special instructions:    [facility]- Preparing For Surgery  Before surgery, you can play an important role. Because skin is not sterile, your skin needs to be as free of germs as possible. You can  reduce the number of germs on your skin by washing with CHG (chlorahexidine gluconate) Soap before surgery.  CHG is an antiseptic cleaner which kills germs and bonds with the skin to continue killing germs even after washing.  Please do not use if you have an allergy to CHG or antibacterial soaps. If your skin becomes reddened/irritated stop using the CHG.  Do not shave (including legs and underarms) for at least 48 hours prior to first CHG shower. It is OK to shave your face.  Please follow these instructions carefully.   1. Shower the NIGHT BEFORE SURGERY and the MORNING OF SURGERY with CHG.   2. If you chose to wash your hair, wash your hair first as usual with your normal shampoo.  3. After you shampoo, rinse your hair and body thoroughly to remove the shampoo.  4. Use CHG as you would any other liquid soap. You can apply CHG directly to the skin and wash gently with a scrungie or a clean washcloth.   5. Apply the CHG Soap to your body ONLY FROM THE NECK DOWN.  Do not use on open wounds or open sores. Avoid contact with your eyes, ears, mouth and genitals (private parts). Wash Face and genitals (private parts)  with your normal soap.  6. Wash thoroughly, paying special attention to the area where your surgery will be performed.  7. Thoroughly rinse your body with warm water from the neck down.  8. DO NOT shower/wash with your normal soap after using  and rinsing off the CHG Soap.  9. Pat yourself dry with a CLEAN TOWEL.  10. Wear CLEAN PAJAMAS to bed the night before surgery, wear comfortable clothes the morning of surgery  11. Place CLEAN SHEETS on your bed the night of your first shower and DO NOT SLEEP WITH PETS.    Day of Surgery: Do not apply any deodorants/lotions. Please wear clean clothes to the hospital/surgery center.      Please read over the following fact sheets that you were given. Coughing and Deep Breathing, MRSA Information and Surgical Site Infection  Prevention

## 2017-09-20 NOTE — Therapy (Signed)
[place], [place], [address] Phone: [phone]   Fax:  [phone]  Physical Therapy Evaluation  Patient Details  Name: Barry Decker MRN: [id number] Date of Birth: 04-06-1933 Referring Provider: Lauree Chandler MD   Encounter Date: 09/20/2017  PT End of Session - 09/20/17 0845    Visit Number  1    Number of Visits  1    Date for PT Re-Evaluation  09/21/17    PT Start Time  0800    PT Stop Time  0840    PT Time Calculation (min)  40 min    Equipment Utilized During Treatment  Gait belt    Behavior During Therapy  [facility] for tasks assessed/performed       Past Medical History:  Diagnosis Date  . Aortic valve disease   . Arthritis    "spinal" (11/16/2015)  . Colon cancer ([place])    precancer-partial colectomy  . Elevated HDL   . Gout   . Heart murmur   . Hypertension   . Kidney stones   . Squamous cell skin cancer     Past Surgical History:  Procedure Laterality Date  . BACK SURGERY    . CATARACT EXTRACTION W/ INTRAOCULAR LENS  IMPLANT, BILATERAL Bilateral 2017  . COLONOSCOPY W/ POLYPECTOMY    . FACET JOINT INJECTION  2009  . LITHOTRIPSY  2010  . LUMBAR LAMINECTOMY  ~ 1992  . PARTIAL COLECTOMY  1997   precancerours  . PARTIAL KNEE ARTHROPLASTY Left 11/16/2015   Procedure: LEFT KNEE UNICOMPARTMENTAL ;  Surgeon: [name], MD;  Location: [place];  Service: Orthopedics;  Laterality: Left;  . REPLACEMENT UNICONDYLAR JOINT KNEE Left 11/16/2015  . RIGHT/LEFT HEART CATH AND CORONARY ANGIOGRAPHY N/A 08/10/2017   Procedure: RIGHT/LEFT HEART CATH AND CORONARY ANGIOGRAPHY;  Surgeon: [name], MD;  Location: [place] [facility];  Service: Cardiovascular;  Laterality: N/A;  . TONSILLECTOMY    . TYMPANOPLASTY Left 1998   ruptured ear drum with skin graft    There were no vitals filed for this visit.   Subjective Assessment - 09/20/17 0803    Subjective  pt is a 84 y.o M with CC  of progressive worsening over the last 10 years with fatigue and difficulty catching breath with excertion. reports aches and pains but nothing that would limit walking/ standing or normal activities.     Limitations  Sitting;Walking    How long can you sit comfortably?  60 min    How long can you stand comfortably?  30 min    How long can you walk comfortably?  20-30  min    Patient Stated Goals  to get the heart better, improve QOL    Currently in Pain?  No/denies         [facility] PT Assessment - 09/20/17 9381      Assessment   Medical Diagnosis  severe aortic stenosis    Referring Provider  Lauree Chandler MD    Onset Date/Surgical Date  -- 10 years with progressive worsening    Hand Dominance  Right    Prior Therapy  yes for knee athroplasty      Precautions   Precautions  None      Restrictions   Weight Bearing Restrictions  No      Balance Screen   Has the patient fallen in the past 6 months  No    Has the patient had a decrease in activity level  because of a fear of falling?   No    Is the patient reluctant to leave their home because of a fear of falling?   No      Home Environment   Living Environment  Private residence    Living Arrangements  Spouse/significant other    Available Help at Discharge  Family;Available PRN/intermittently    Type of Home  House    Home Access  Stairs to enter    Entrance Stairs-Number of Steps  5    Entrance Stairs-Rails  Left ascending steps    Home Layout  Two level    Alternate Level Stairs-Number of Steps  12    Alternate Level Stairs-Rails  Left ascending    Additional Comments  reports they don't go up to the second floor      Prior Function   Level of Independence  Independent with basic ADLs    Vocation  Retired      Associate Professor   Overall Cognitive Status  Within Functional Limits for tasks assessed      Posture/Postural Control   Posture/Postural Control  Postural limitations    Postural Limitations  Rounded  Shoulders;Forward head      ROM / Strength   AROM / PROM / Strength  AROM;Strength      AROM   Overall AROM   Within functional limits for tasks performed      Strength   Overall Strength Comments  functional strength overall except for L ankle DF sith a 4/5    Strength Assessment Site  Hand    Right Hand Grip (lbs)  80    Left Hand Grip (lbs)  68      Ambulation/Gait   Ambulation/Gait  Yes    Gait Pattern  Step-through pattern;Antalgic foot slap noted on the L     Gait Comments  pt demonstrates 38.45% disability compard to normal age related value        [facility] Pre-Surgical Assessment - 09/20/17 0001    5 Meter Walk Test- trial 1  4 sec    5 Meter Walk Test- trial 2  3 sec.     5 Meter Walk Test- trial 3  3 sec.    5 meter walk test average  3.33 sec    4 Stage Balance Test tolerated for:   10 sec.    4 Stage Balance Test Position  1    Sit To Stand Test- trial 1  18 sec.    ADL/IADL Independent with:  Bathing;Dressing;Meal prep;Finances    ADL/IADL Needs Assistance with:  [employer] work    ADL/IADL Fraility Index  Midly frail    6 Minute Walk- Baseline  yes    BP (mmHg)  135/72    HR (bpm)  73    02 Sat (%RA)  94 %    Modified Borg Scale for Dyspnea  3- Moderate shortness of breath or breathing difficulty    Perceived Rate of Exertion (Borg)  11- Fairly light    6 Minute Walk Post Test  yes    BP (mmHg)  129/70    HR (bpm)  91    02 Sat (%RA)  95 %    Modified Borg Scale for Dyspnea  3- Moderate shortness of breath or breathing difficulty    Perceived Rate of Exertion (Borg)  13- Somewhat hard    Aerobic Endurance Distance Walked  842    Endurance additional comments  mulitple rest breaks maintained 95% O2 and HR  at 91               Objective measurements completed on examination: See above findings.              PT Education - 09/20/17 0844    Education provided  Yes    Education Details  frailty score and evaluation findings. gait regarding to  increased L foot slap regarding potential tripping hazard    Person(s) Educated  Patient;Spouse    Methods  Explanation;Verbal cues    Comprehension  Verbalized understanding;Verbal cues required                  Plan - 09/20/17 0845    Clinical Impression Statement  See assessment below    PT Frequency  One time visit    PT Next Visit Plan  evaluation only    Consulted and Agree with Plan of Care  Patient;Family member/caregiver        Clinical Impression Statement: Pt is a 84 yo M presenting to OP PT for evaluation prior to possible TAVR surgery due to severe aortic stenosis. Pt reports onset of 10 years ago with progressive worsening. Symptoms are limiting walking/ standing endurnace. Pt presents with good ROM and strength in all joints except limited L ankle DF strength balance and is fair at mild fall risk 4 stage balance test, good walking speed and poor aerobic endurance per 6 minute walk test. Pt ambulated 842 feet resting 2 times once at 1:52 lasting 30 seconds and again at 4:10 lasting for 57 seconds. At times of rest, patient's HR was 91 bpm and O2 was 95 on room air. Pt reported 3/10 shortness of breath on modified scale for dyspnea.   Fatigue  increased significantly with 6 minute walk test. Based on the Short Physical Performance Battery, patient has a frailty rating of 9/12 with </= 5/12 considered frail.    Patient will benefit from skilled therapeutic intervention in order to improve the following deficits and impairments:     Visit Diagnosis: Other abnormalities of gait and mobility     Problem List Patient Active Problem List   Diagnosis Date Noted  . Squamous cell skin cancer   . Kidney stones   . Hypertension   . Heart murmur   . Elevated HDL   . Colon cancer ([place])   . Arthritis   . Aortic valve disease   . Chronic diastolic CHF (congestive heart failure) ([place]) 03/30/2017  . Abdominal aortic ectasia ([place]) 04/07/2016  . Primary osteoarthritis of  left knee 10/29/2015  . Osteoarthritis of cervical spine 08/31/2015  . Throat pain in adult 08/31/2015  . Osteoarthritis of left knee 05/25/2014  . HYPERCHOLESTEROLEMIA WITH HIGH HDL 01/31/2007  . Gout 01/31/2007  . Essential hypertension 01/31/2007  . History of malignant neoplasm of large intestine 01/31/2007  . Severe aortic stenosis    Starr Lake PT, DPT, LAT, ATC  09/20/17  8:52 AM      [facility] [address] [place], [place], [address] Phone: [phone]   Fax:  [phone]  Name: Barry Decker MRN: [id number] Date of Birth: [birth year]

## 2017-09-21 ENCOUNTER — Ambulatory Visit (HOSPITAL_COMMUNITY)
Admission: RE | Admit: 2017-09-21 | Discharge: 2017-09-21 | Disposition: A | Discharge status: Home / Self Care | Payer: PPO | Source: Ambulatory Visit | Attending: Cardiovascular Disease | Admitting: Cardiovascular Disease

## 2017-09-21 ENCOUNTER — Other Ambulatory Visit: Payer: Self-pay

## 2017-09-21 ENCOUNTER — Encounter (HOSPITAL_COMMUNITY)
Admission: RE | Admit: 2017-09-21 | Discharge: 2017-09-21 | Disposition: A | Discharge status: Home / Self Care | Payer: PPO | Source: Ambulatory Visit | Attending: Cardiovascular Disease | Admitting: Cardiovascular Disease

## 2017-09-21 ENCOUNTER — Encounter (HOSPITAL_COMMUNITY): Payer: Self-pay

## 2017-09-21 DIAGNOSIS — Z01818 Encounter for other preprocedural examination: Secondary | ICD-10-CM | POA: Diagnosis not present

## 2017-09-21 DIAGNOSIS — R0600 Dyspnea, unspecified: Secondary | ICD-10-CM

## 2017-09-21 DIAGNOSIS — Z01810 Encounter for preprocedural cardiovascular examination: Secondary | ICD-10-CM | POA: Diagnosis not present

## 2017-09-21 DIAGNOSIS — I350 Nonrheumatic aortic (valve) stenosis: Secondary | ICD-10-CM

## 2017-09-21 DIAGNOSIS — R9431 Abnormal electrocardiogram [ECG] [EKG]: Secondary | ICD-10-CM | POA: Diagnosis not present

## 2017-09-21 DIAGNOSIS — Z01812 Encounter for preprocedural laboratory examination: Secondary | ICD-10-CM | POA: Insufficient documentation

## 2017-09-21 DIAGNOSIS — R0609 Other forms of dyspnea: Secondary | ICD-10-CM

## 2017-09-21 LAB — TYPE AND SCREEN
ABO/RH(D): O POS
Antibody Screen: NEGATIVE

## 2017-09-21 LAB — URINALYSIS, ROUTINE W REFLEX MICROSCOPIC
Bacteria, UA: NONE SEEN
Bilirubin Urine: NEGATIVE
Glucose, UA: NEGATIVE mg/dL
Hgb urine dipstick: NEGATIVE
Ketones, ur: NEGATIVE mg/dL
Leukocytes, UA: NEGATIVE
Nitrite: NEGATIVE
Protein, ur: NEGATIVE mg/dL
Specific Gravity, Urine: 1.011 (ref 1.005–1.030)
Squamous Epithelial / LPF: NONE SEEN
WBC, UA: NONE SEEN WBC/hpf (ref 0–5)
pH: 5 (ref 5.0–8.0)

## 2017-09-21 LAB — PULMONARY FUNCTION TEST
DL/VA % pred: 98 %
DL/VA: 4.61 ml/min/mmHg/L
DLCO unc % pred: 69 %
DLCO unc: 24.28 ml/min/mmHg
FEF 25-75 Post: 2.2 L/sec
FEF 25-75 Pre: 1.54 L/sec
FEF2575-%Change-Post: 42 %
FEF2575-%Pred-Post: 108 %
FEF2575-%Pred-Pre: 75 %
FEV1-%Change-Post: 10 %
FEV1-%Pred-Post: 74 %
FEV1-%Pred-Pre: 67 %
FEV1-Post: 2.26 L
FEV1-Pre: 2.04 L
FEV1FVC-%Change-Post: 0 %
FEV1FVC-%Pred-Pre: 105 %
FEV6-%Change-Post: 9 %
FEV6-%Pred-Post: 75 %
FEV6-%Pred-Pre: 68 %
FEV6-Post: 3 L
FEV6-Pre: 2.73 L
FEV6FVC-%Pred-Post: 107 %
FEV6FVC-%Pred-Pre: 107 %
FVC-%Change-Post: 10 %
FVC-%Pred-Post: 70 %
FVC-%Pred-Pre: 64 %
FVC-Post: 3.01 L
FVC-Pre: 2.73 L
Post FEV1/FVC ratio: 75 %
Post FEV6/FVC ratio: 100 %
Pre FEV1/FVC ratio: 75 %
Pre FEV6/FVC Ratio: 100 %
RV % pred: 145 %
RV: 4.11 L
TLC % pred: 95 %
TLC: 7.15 L

## 2017-09-21 LAB — BLOOD GAS, ARTERIAL
Acid-Base Excess: 0.4 mmol/L (ref 0.0–2.0)
Bicarbonate: 24.3 mmol/L (ref 20.0–28.0)
Drawn by: 421801
O2 Saturation: 96.7 %
Patient temperature: 98.6
pCO2 arterial: 37.7 mmHg (ref 32.0–48.0)
pH, Arterial: 7.425 (ref 7.350–7.450)
pO2, Arterial: 86.3 mmHg (ref 83.0–108.0)

## 2017-09-21 LAB — HEMOGLOBIN A1C
Hgb A1c MFr Bld: 5.2 % (ref 4.8–5.6)
Mean Plasma Glucose: 102.54 mg/dL

## 2017-09-21 LAB — COMPREHENSIVE METABOLIC PANEL
ALT: 18 U/L (ref 17–63)
AST: 22 U/L (ref 15–41)
Albumin: 3.4 g/dL — ABNORMAL LOW (ref 3.5–5.0)
Alkaline Phosphatase: 69 U/L (ref 38–126)
Anion gap: 9 (ref 5–15)
BUN: 13 mg/dL (ref 6–20)
CO2: 21 mmol/L — ABNORMAL LOW (ref 22–32)
Calcium: 9.1 mg/dL (ref 8.9–10.3)
Chloride: 105 mmol/L (ref 101–111)
Creatinine, Ser: 0.99 mg/dL (ref 0.61–1.24)
GFR calc Af Amer: >60 mL/min (ref >60)
GFR calc non Af Amer: >60 mL/min (ref >60)
Glucose, Bld: 111 mg/dL — ABNORMAL HIGH (ref 65–99)
Potassium: 4.2 mmol/L (ref 3.5–5.1)
Sodium: 135 mmol/L (ref 135–145)
Total Bilirubin: 0.8 mg/dL (ref 0.3–1.2)
Total Protein: 6.5 g/dL (ref 6.5–8.1)

## 2017-09-21 LAB — SURGICAL PCR SCREEN
MRSA, PCR: NEGATIVE
Staphylococcus aureus: NEGATIVE

## 2017-09-21 LAB — CBC
HCT: 40.8 % (ref 39.0–52.0)
Hemoglobin: 13.7 g/dL (ref 13.0–17.0)
MCH: 34.9 pg — ABNORMAL HIGH (ref 26.0–34.0)
MCHC: 33.6 g/dL (ref 30.0–36.0)
MCV: 103.8 fL — ABNORMAL HIGH (ref 78.0–100.0)
Platelets: 254 10*3/uL (ref 150–400)
RBC: 3.93 MIL/uL — ABNORMAL LOW (ref 4.22–5.81)
RDW: 13.7 % (ref 11.5–15.5)
WBC: 7.3 10*3/uL (ref 4.0–10.5)

## 2017-09-21 LAB — BRAIN NATRIURETIC PEPTIDE: B Natriuretic Peptide: 67.9 pg/mL (ref 0.0–100.0)

## 2017-09-21 LAB — APTT: aPTT: 33 seconds (ref 24–36)

## 2017-09-21 LAB — PROTIME-INR
INR: 1.04
Prothrombin Time: 13.5 seconds (ref 11.4–15.2)

## 2017-09-21 MED ORDER — ALBUTEROL SULFATE (2.5 MG/3ML) 0.083% IN NEBU
2.5000 mg | INHALATION_SOLUTION | Freq: Once | RESPIRATORY_TRACT | Status: AC
  Administered 2017-09-21: 2.5 mg via RESPIRATORY_TRACT

## 2017-09-21 NOTE — Progress Notes (Signed)
*  PRELIMINARY RESULTS* Vascular Ultrasound Carotid Duplex (Doppler) has been completed.  Findings suggest 1-39% internal carotid artery stenosis bilaterally. Vertebral arteries are patent with antegrade flow.  09/21/2017 11:09 AM Maudry Mayhew, BS, RVT, RDCS, RDMS

## 2017-09-24 MED ORDER — EPINEPHRINE PF 1 MG/ML IJ SOLN
0.0000 ug/min | INTRAMUSCULAR | Status: DC
  Filled 2017-09-24: qty 4

## 2017-09-24 MED ORDER — NITROGLYCERIN IN D5W 200-5 MCG/ML-% IV SOLN
2.0000 ug/min | INTRAVENOUS | Status: DC
  Filled 2017-09-24: qty 250

## 2017-09-24 MED ORDER — MAGNESIUM SULFATE 50 % IJ SOLN
40.0000 meq | INTRAMUSCULAR | Status: DC
  Filled 2017-09-24: qty 9.85

## 2017-09-24 MED ORDER — SODIUM CHLORIDE 0.9 % IV SOLN
INTRAVENOUS | Status: DC
  Filled 2017-09-24: qty 1

## 2017-09-24 MED ORDER — POTASSIUM CHLORIDE 2 MEQ/ML IV SOLN
80.0000 meq | INTRAVENOUS | Status: DC
  Filled 2017-09-24: qty 40

## 2017-09-24 MED ORDER — SODIUM CHLORIDE 0.9 % IV SOLN
INTRAVENOUS | Status: DC
  Filled 2017-09-24: qty 30

## 2017-09-24 MED ORDER — DEXMEDETOMIDINE HCL IN NACL 400 MCG/100ML IV SOLN
0.1000 ug/kg/h | INTRAVENOUS | Status: AC
  Administered 2017-09-25: 1.5 ug/kg/h via INTRAVENOUS
  Filled 2017-09-24: qty 100

## 2017-09-24 MED ORDER — VANCOMYCIN HCL 10 G IV SOLR
1500.0000 mg | INTRAVENOUS | Status: AC
  Administered 2017-09-25: 1500 mg via INTRAVENOUS
  Filled 2017-09-24: qty 1500

## 2017-09-24 MED ORDER — SODIUM CHLORIDE 0.9 % IV SOLN
1.5000 g | INTRAVENOUS | Status: AC
  Administered 2017-09-25: 1.5 g via INTRAVENOUS
  Filled 2017-09-24: qty 1.5

## 2017-09-24 MED ORDER — NOREPINEPHRINE BITARTRATE 1 MG/ML IV SOLN
0.0000 ug/min | INTRAVENOUS | Status: AC
  Administered 2017-09-25: 5 ug/min via INTRAVENOUS
  Filled 2017-09-24: qty 4

## 2017-09-24 MED ORDER — SODIUM CHLORIDE 0.9 % IV SOLN
30.0000 ug/min | INTRAVENOUS | Status: DC
  Filled 2017-09-24: qty 2

## 2017-09-24 MED ORDER — DOPAMINE-DEXTROSE 3.2-5 MG/ML-% IV SOLN
0.0000 ug/kg/min | INTRAVENOUS | Status: DC
  Filled 2017-09-24: qty 250

## 2017-09-24 NOTE — H&P (Signed)
[address]       [place] [address]             [phone]      Cardiothoracic Surgery Admission History and Physical   Referring Provider is [name]*  PCP is [name], MD      Chief Complaint  Patient presents with  . Thoracic Aortic Aneurysm       HPI:  The patient is an 84 year old gentleman with hypertension, hyperlipidemia, and aortic stenosis who had rheumatic fever as a child and a heart murmur most of his life. An echocardiogram in 09/2011 showed moderate aortic stenosis with a mean gradient of 31 mmHg and a peak gradient of 54 mmHg. There is moderate aortic insufficiency at that time. He has been followed with serial echocardiograms and his most recent echocardiogram on 07/12/2017 showed a trileaflet aortic valve with moderate thickening and calcification of the leaflets and restricted mobility. There is severe annular calcification. The mean gradient was 33 mmHg with a peak gradient of 57 mmHg. Dimensionless index was 0.32 with a valve area calculated at 1.8 cm. There is mild aortic insufficiency. Left ventricular ejection fraction was normal at 65-70% with grade 1 diastolic dysfunction. He underwent cardiac catheterization on 08/10/2017 which showed a mean gradient of 32 mmHg with a peak to peak gradient of 33 mmHg and an aortic valve area at 1.37 cm. He had mild nonobstructive coronary disease. Right heart pressures were normal. He was also noted to have an enlarged aortic root and underwent a CTA of the chest on 07/23/2017 which showed a transverse dimension at the level of the sinus of Valsalva 4.8 cm. The ascending aorta was 4.6 cm with the aortic arch 3.7 cm in the descending aorta 3.5 cm. There is also a 6 mm pleural-based nodule in the right lower lobe as well as some inferior left lower lobe mild nodular parenchymal changes measuring up to 8 x 6 mm. Follow-up CT scan was recommended in 3-6 months. 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 Over the past few months he has developed exertional shortness of breath and fatigue as well as some orthopnea. His wife is with him today and is noted that he gets short of breath with low level activity which never used to bother him before. He has had some lower extremity edema and is on Lasix. He denies any dizziness or syncope. He has had no chest pain or pressure.       Past Medical History:  Diagnosis Date  . Aortic valve disease   . Arthritis    "spinal" (11/16/2015)  . Colon cancer ([place])    precancer-partial colectomy  . Elevated HDL   . Gout   . Heart murmur   . Hypertension   . Kidney stones   . Squamous cell skin cancer         Past Surgical History:  Procedure Laterality Date  . BACK SURGERY    . CATARACT EXTRACTION W/ INTRAOCULAR LENS IMPLANT, BILATERAL Bilateral 2017  . COLONOSCOPY W/ POLYPECTOMY    . FACET JOINT INJECTION  2009  . LITHOTRIPSY  2010  . LUMBAR LAMINECTOMY  ~ 1992  . PARTIAL COLECTOMY  1997   precancerours  . PARTIAL KNEE ARTHROPLASTY Left 11/16/2015   Procedure: LEFT KNEE UNICOMPARTMENTAL ; Surgeon: [name], MD; Location: [facility]; Service: Orthopedics; Laterality: Left;  . REPLACEMENT UNICONDYLAR JOINT KNEE Left 11/16/2015  . RIGHT/LEFT HEART CATH AND CORONARY  ANGIOGRAPHY N/A 08/10/2017   Procedure: RIGHT/LEFT HEART CATH AND CORONARY ANGIOGRAPHY; Surgeon: [name], MD; Location: [facility]; Service: Cardiovascular; Laterality: N/A;  . TONSILLECTOMY    . TYMPANOPLASTY Left 1998   ruptured ear drum with skin graft        Family History  Problem Relation Age of Onset  . Heart attack Mother 70   former smoker  . Heart attack Father 50   former smoker  . Hypertension Sister   . Heart attack Brother 26   Rheumatic fever   Social History        Socioeconomic History  . Marital status: Married    Spouse name: Not on file  . Number of children: 5  . Years of education: Not on file  . Highest  education level: Not on file  Social Needs  . Financial resource strain: Not on file  . Food insecurity - worry: Not on file  . Food insecurity - inability: Not on file  . Transportation needs - medical: Not on file  . Transportation needs - non-medical: Not on file  Occupational History  . Occupation: Government social research officer  Tobacco Use  . Smoking status: Former Smoker    Packs/day: 1.00    Years: 20.00    Pack years: 20.00    Types: Cigarettes    Last attempt to quit: 09/15/1980    Years since quitting: 36.9  . Smokeless tobacco: Never Used  Substance and Sexual Activity  . Alcohol use: Yes    Alcohol/week: 7.2 oz    Types: 10 Standard drinks or equivalent, 1 Glasses of wine, 1 Shots of liquor per week  . Drug use: No  . Sexual activity: No  Other Topics Concern  . Not on file  Social History Narrative   Married. 5 children. 4 grandkids. 1 greatgrandkid.       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       Hobbies: woodworking      Advised to consider advanced directives/hcpoa-thinks wife may have 20 from years ago.         Current Outpatient Medications  Medication Sig Dispense Refill  . allopurinol (ZYLOPRIM) 300 MG tablet Take 1 tablet by mouth once daily (Patient taking differently: Take 300 mg by mouth once daily) 90 tablet 1  . amLODipine (NORVASC) 10 MG tablet Take 1 tablet by mouth daily (Patient taking differently: Take 10 mg by mouth once daily) 90 tablet 2  . aspirin 325 MG tablet Take 162.5 mg by mouth daily.    . IRON PO Take 1 tablet by mouth daily.     . potassium citrate (UROCIT-K) 10 MEQ (1080 MG) SR tablet Take 1 tablet (10 mEq total) by mouth 3 (three) times daily with meals. (Patient taking differently: Take 10 mEq by mouth daily. ) 300 tablet 1  . quinapril (ACCUPRIL) 40 MG tablet Take 1 tablet by mouth daily (Patient taking differently: Take 40 mg by mouth daily) 90 tablet 1  . furosemide (LASIX) 20 MG tablet Take 1 tablet (20 mg  total) by mouth daily. 90 tablet 2   No current facility-administered medications for this visit.    No Known Allergies   Review of Systems:   General: normal appetite, reduced energy, no weight gain, no weight loss, no fever  Cardiac: no chest pain with exertion, no chest pain at rest, +SOB with mild exertion, no resting SOB, no PND, + orthopnea, no palpitations, no arrhythmia, no atrial fibrillation, + LE  edema, no dizzy spells, no syncope  Respiratory: exertional shortness of breath, no home oxygen, no productive cough, no dry cough, no bronchitis, no wheezing, no hemoptysis, no asthma, no pain with inspiration or cough, no sleep apnea, no CPAP at night  GI: no difficulty swallowing, no reflux, no frequent heartburn, no hiatal hernia, no abdominal pain, no constipation, no diarrhea, no hematochezia, no hematemesis, no melena  GU: no dysuria, no frequency, no urinary tract infection, no hematuria, no enlarged prostate, + kidney stones, no kidney disease  Vascular: no pain suggestive of claudication, no pain in feet, no leg cramps, no varicose veins, no DVT, no non-healing foot ulcer  Neuro: no stroke, no TIA's, no seizures, no headaches, no temporary blindness one eye, no slurred speech, no peripheral neuropathy, no chronic pain, no instability of gait, no memory/cognitive dysfunction  Musculoskeletal: + arthritis, no joint swelling, no myalgias, no difficulty walking, normal mobility  Skin: no rash, no itching, no skin infections, no pressure sores or ulcerations  Psych: no anxiety, no depression, no nervousness, no unusual recent stress  Eyes: no blurry vision, no floaters, no recent vision changes, wears glasses or contacts  ENT: + hearing loss, no loose or painful teeth, no dentures, last saw dentist last year  Hematologic: no easy bruising, no abnormal bleeding, no clotting disorder, no frequent epistaxis  Endocrine: no diabetes, does not check CBG's at home    Physical Exam:   BP  (!) 144/77  Pulse 84  Resp 20  Ht 6' (1.829 m)  Wt 235 lb (106.6 kg)  SpO2 97% Comment: RA  BMI 31.87 kg/m  General: Elderly but well-appearing  HEENT: Unremarkable, NCAT, PERLA, EOMI, oropharynx clear  Neck: no JVD, no bruits, no adenopathy or thyromegaly  Chest: clear to auscultation, symmetrical breath sounds, no wheezes, no rhonchi  CV: RRR, grade III/VI crescendo/decrescendo murmur heard best at RSB, no diastolic murmur  Abdomen: soft, non-tender, no masses or organomegaly  Extremities: warm, well-perfused, pulses palpable in feet, no LE edema  Rectal/GU Deferred  Neuro: Grossly non-focal and symmetrical throughout  Skin: Clean and dry, no rashes, no breakdown    Diagnostic Tests:  Result status: Final result  [facility]  [address], [place] [address]  [phone]  -------------------------------------------------------------------  Transthoracic Echocardiography  Patient: Hulen, Mandler  MR #: [id number]  Study Date: 07/12/2017  Gender: M  Age: 27  Height: 182.9 cm  Weight: 107 kg  BSA: 2.36 m^2  Pt. Status:  Room:  ATTENDING Fransico Him, MD  Willia Craze, Christopher  REFERRING [place], [place], Outpatient  SONOGRAPHER [facility], RDCS  cc:  -------------------------------------------------------------------  LV EF: 65% - 70%  -------------------------------------------------------------------  Indications: Aortic Stenosis (I35.0).  -------------------------------------------------------------------  History: PMH: History of Moderate Aortic Stenosis (prior mean  gradient 67mmHG, 10-23-16) Dyspnea and murmur. Congestive heart  failure. Aortic valve disease. Risk factors: Family history of  coronary artery disease. Former tobacco use. Hypertension.  Dyslipidemia.  -------------------------------------------------------------------  Study Conclusions  - Left ventricle: The cavity size was mildly  dilated. There was  moderate concentric hypertrophy. Systolic function was vigorous.  The estimated ejection fraction was in the range of 65% to 70%.  Wall motion was normal; there were no regional wall motion  abnormalities. There was an increased relative contribution of  atrial contraction to ventricular filling. Doppler parameters are  consistent with abnormal left ventricular relaxation (grade 1  diastolic dysfunction). Doppler parameters are consistent with  high ventricular filling pressure.  -  Aortic valve: Severely calcified annulus. Trileaflet; moderately  thickened, moderately calcified leaflets. Valve mobility was  mildly restricted. There was moderate stenosis. There was mild  regurgitation. Mean gradient (S): 33 mm Hg. Valve area (VTI):  1.77 cm^2. Valve area (Vmax): 1.86 cm^2. Valve area (Vmean): 1.78  cm^2.  - Aorta: Aortic root dimension: 49 mm (ED). Ascending aortic  diameter: 49 mm (S).  - Aortic root: The aortic root was moderately dilated.  - Ascending aorta: The ascending aorta was moderately dilated.  - Left atrium: The atrium was mildly dilated.  - Pulmonary arteries: PA peak pressure: 41 mm Hg (S).  - Impressions: Moderate LVH, mildly dilated LV, mild LAE, normal  LVH EF 60-65%, calcified AV with reduced leaflet mobility and  moderate AS and mild AI. There is moderate dilatation of the  aortic root at the sinus of Vlalsalva and moderately dilated  ascending aorta. Consider chest CT angio or chest MRI/MRA for  further evaluation. COmpared to prior echo, the mean AV gradient  has not changed but the ascending aorta and aortic root diameters  have increased (aortic root 48>76mm and ascending aorta 44>95mm).  Impressions:  - Moderate LVH, mildly dilated LV, mild LAE, normal LVH EF 60-65%,  calcified AV with reduced leaflet mobility and moderate AS and  mild AI. There is moderate dilatation of the aortic root at the  sinus of Vlalsalva and moderately dilated  ascending aorta.  Consider chest CT angio or chest MRI/MRA for further evaluation.  COmpared to prior echo, the mean AV gradient has not changed but  the ascending aorta and aortic root diameters have increased  (aortic root 48>27mm and ascending aorta 44>16mm). The right  ventricular systolic pressure was increased consistent with mild  pulmonary hypertension.  -------------------------------------------------------------------  Study data: Comparison was made to the study of 10/23/2016. Study  status: Routine. Procedure: Transthoracic echocardiography.  Image quality was adequate. Transthoracic  echocardiography. M-mode, complete 2D, spectral Doppler, and color  Doppler. Birthdate: Patient birthdate: Oct 23, 1932. Age: Patient  is 84 yr old. Sex: Gender: male. BMI: 32 kg/m^2. Blood  pressure: 136/70 Patient status: Outpatient. Study date:  Study date: 07/12/2017. Study time: 09:39 AM. Location: [facility]  -------------------------------------------------------------------  -------------------------------------------------------------------  Left ventricle: The cavity size was mildly dilated. There was  moderate concentric hypertrophy. Systolic function was vigorous.  The estimated ejection fraction was in the range of 65% to 70%.  Wall motion was normal; there were no regional wall motion  abnormalities. There was an increased relative contribution of  atrial contraction to ventricular filling. Doppler parameters are  consistent with abnormal left ventricular relaxation (grade 1  diastolic dysfunction). Doppler parameters are consistent with high  ventricular filling pressure.  -------------------------------------------------------------------  Aortic valve: Severely calcified annulus. Trileaflet; moderately  thickened, moderately calcified leaflets. Valve mobility was mildly  restricted. Doppler: There was moderate stenosis. There was  mild regurgitation. VTI ratio of LVOT  to aortic valve: 0.31.  Valve area (VTI): 1.77 cm^2. Indexed valve area (VTI): 0.75  cm^2/m^2. Peak velocity ratio of LVOT to aortic valve: 0.32. Valve  area (Vmax): 1.86 cm^2. Indexed valve area (Vmax): 0.79 cm^2/m^2.  Mean velocity ratio of LVOT to aortic valve: 0.31. Valve area  (Vmean): 1.78 cm^2. Indexed valve area (Vmean): 0.75 cm^2/m^2.  Mean gradient (S): 33 mm Hg. Peak gradient (S): 57 mm Hg.  -------------------------------------------------------------------  Aorta: Aortic root: The aortic root was moderately dilated.  Ascending aorta: The ascending aorta was moderately dilated.  -------------------------------------------------------------------  Mitral valve: Structurally  normal valve. Mobility was not  restricted. Doppler: Transvalvular velocity was within the normal  range. There was no evidence for stenosis. There was no  regurgitation. Peak gradient (D): 3 mm Hg.  -------------------------------------------------------------------  Left atrium: The atrium was mildly dilated.  -------------------------------------------------------------------  Right ventricle: The cavity size was normal. Wall thickness was  normal. Systolic function was normal.  -------------------------------------------------------------------  Pulmonic valve: Structurally normal valve. Cusp separation was  normal. Doppler: Transvalvular velocity was within the normal  range. There was no evidence for stenosis. There was no  regurgitation.  -------------------------------------------------------------------  Tricuspid valve: Structurally normal valve. Doppler:  Transvalvular velocity was within the normal range. There was mild  regurgitation.  -------------------------------------------------------------------  Pulmonary artery: The main pulmonary artery was normal-sized.  Systolic pressure was within the normal range.  -------------------------------------------------------------------  Right  atrium: The atrium was normal in size.  -------------------------------------------------------------------  Pericardium: There was no pericardial effusion.  -------------------------------------------------------------------  Systemic veins:  Inferior vena cava: The vessel was normal in size.  -------------------------------------------------------------------  Measurements  Left ventricle Value Reference  LV ID, ED, PLAX chordal (H) 53 mm 43 - 52  LV ID, ES, PLAX chordal 29.2 mm 23 - 38  LV fx shortening, PLAX chordal 45 % >=29  LV PW thickness, ED 13.1 mm ----------  IVS/LV PW ratio, ED 0.98 <=1.3  Stroke volume, 2D 162 ml ----------  Stroke volume/bsa, 2D 69 ml/m^2 ----------  LV e&', lateral 5.11 cm/s ----------  LV E/e&', lateral 16.26 ----------  LV e&', medial 4.9 cm/s ----------  LV E/e&', medial 16.96 ----------  LV e&', average 5.01 cm/s ----------  LV E/e&', average 16.6 ----------  Ventricular septum Value Reference  IVS thickness, ED 12.9 mm ----------  LVOT Value Reference  LVOT ID, S 27 mm ----------  LVOT area 5.73 cm^2 ----------  LVOT peak velocity, S 122 cm/s ----------  LVOT mean velocity, S 83 cm/s ----------  LVOT VTI, S 28.2 cm ----------  LVOT peak gradient, S 6 mm Hg ----------  Aortic valve Value Reference  Aortic valve peak velocity, S 376 cm/s ----------  Aortic valve mean velocity, S 267 cm/s ----------  Aortic valve VTI, S 91.3 cm ----------  Aortic mean gradient, S 33 mm Hg ----------  Aortic peak gradient, S 57 mm Hg ----------  VTI ratio, LVOT/AV 0.31 ----------  Aortic valve area, VTI 1.77 cm^2 ----------  Aortic valve area/bsa, VTI 0.75 cm^2/m^2 ----------  Velocity ratio, peak, LVOT/AV 0.32 ----------  Aortic valve area, peak velocity 1.86 cm^2 ----------  Aortic valve area/bsa, peak 0.79 cm^2/m^2 ----------  velocity  Velocity ratio, mean, LVOT/AV 0.31 ----------  Aortic valve area, mean velocity 1.78 cm^2 ----------  Aortic valve  area/bsa, mean 0.75 cm^2/m^2 ----------  velocity  Aortic regurg pressure half-time 470 ms ----------  Aorta Value Reference  Aortic root ID, ED 49 mm ----------  Ascending aorta ID, A-P, S 49 mm ----------  Left atrium Value Reference  LA ID, A-P, ES 51 mm ----------  LA ID/bsa, A-P 2.16 cm/m^2 <=2.2  LA volume, S 63.2 ml ----------  LA volume/bsa, S 26.8 ml/m^2 ----------  LA volume, ES, 1-p A4C 45.7 ml ----------  LA volume/bsa, ES, 1-p A4C 19.4 ml/m^2 ----------  LA volume, ES, 1-p A2C 72.4 ml ----------  LA volume/bsa, ES, 1-p A2C 30.7 ml/m^2 ----------  Mitral valve Value Reference  Mitral E-wave peak velocity 83.1 cm/s ----------  Mitral A-wave peak velocity 135 cm/s ----------  Mitral deceleration time (H) 342 ms 150 - 230  Mitral peak gradient,  D 3 mm Hg ----------  Mitral E/A ratio, peak 0.6 ----------  Pulmonary arteries Value Reference  PA pressure, S, DP (H) 41 mm Hg <=30  Tricuspid valve Value Reference  Tricuspid regurg peak velocity 307 cm/s ----------  Tricuspid peak RV-RA gradient 38 mm Hg ----------  Right atrium Value Reference  RA ID, S-I, ES, A4C (H) 56.5 mm 34 - 49  RA area, ES, A4C 15.4 cm^2 8.3 - 19.5  RA volume, ES, A/L 34.9 ml ----------  RA volume/bsa, ES, A/L 14.8 ml/m^2 ----------  Right ventricle Value Reference  TAPSE 29.6 mm ----------  RV s&', lateral, S 20.3 cm/s ----------  Legend:  (L) and (H) mark values outside specified reference range.  -------------------------------------------------------------------  Prepared and Electronically Authenticated by  Fransico Him, MD  2019-01-24T10:47:10  Physicians  Panel Physicians Referring Physician Case Authorizing Physician  [name], MD (Primary)    Procedures  RIGHT/LEFT HEART CATH AND CORONARY ANGIOGRAPHY  Conclusion  Mid RCA lesion is 20% stenosed.  Prox LAD lesion is 20% stenosed.  Ost 1st Diag lesion is 60% stenosed. 1. Mild non-obstructive disease in the LAD,  Circumflex and RCA\  2. Moderate stenosis small caliber Diagonal branch  3. Severe aortic stenosis (peak to peak gradient 33 mmHg, mean gradient 32 mmHg, AVA 1.37 cm2).  Recommendations: Will continue workup for AVR. I think we will likely end up pursuing TAVR but given his dilated aortic root, I want to have Dr. Cyndia Bent see him before we plan further testing.   Indications  Severe aortic stenosis [I35.0 (ICD-10-CM)]  Procedural Details/Technique  Technical Details Indication: 84 yo male with thoracic aortic aneurysm and severe AS.   Procedure: The risks, benefits, complications, treatment options, and expected outcomes were discussed with the patient. The patient and/or family concurred with the proposed plan, giving informed consent. The patient was sedated with Versed and Fentanyl. I changed the antecubital IV in the right antecubital vein for a 5 French sheath. The right wrist was prepped and draped in a sterile fashion. 1% lidocaine was used for local anesthesia. Using the modified Seldinger access technique, a 5 French sheath was placed in the right radial artery. 3 mg Verapamil was given through the sheath. 5500 units IV heparin was given. Standard diagnostic catheters were used to perform selective coronary angiography. I crossed the the aortic valve with an AL-1 catheter and a straight wire. LV pressures measured. No left ventricular angiogram was performed. The sheath was removed from the right radial artery and a Terumo hemostasis band was applied at the arteriotomy site on the right wrist.     Estimated blood loss <50 mL.  During this procedure the patient was administered the following to achieve and maintain moderate conscious sedation: Versed 1 mg, Fentanyl 25 mcg, while the patient's heart rate, blood pressure, and oxygen saturation were continuously monitored. The period of conscious sedation was 38 minutes, of which I was present face-to-face 100% of this time.  Complications    Complications documented before study signed (08/10/2017 3:16 PM EST)   RIGHT/LEFT HEART CATH AND CORONARY ANGIOGRAPHY   None Documented by [name], MD 08/10/2017 3:15 PM EST  Time Range: Intra-procedure    Coronary Findings  Diagnostic  Dominance: Right  Left Anterior Descending  Prox LAD lesion 20% stenosed  Prox LAD lesion is 20% stenosed.  First Diagonal Branch  Ost 1st Diag lesion 60% stenosed  Ost 1st Diag lesion is 60% stenosed.  Right Coronary Artery  Vessel is large.  Mid RCA lesion 20% stenosed  Mid RCA lesion is 20% stenosed.  Intervention  No interventions have been documented.  Coronary Diagrams  Diagnostic Diagram     Implants     No implant documentation for this case.  MERGE Images  Link to Procedure Log   Show images for CARDIAC CATHETERIZATION Procedure Log  Hemo Data   Most Recent Value  Fick Cardiac Output 7.67 L/min  Fick Cardiac Output Index 3.35 (L/min)/BSA  Aortic Mean Gradient 32 mmHg  Aortic Peak Gradient 33 mmHg  Aortic Valve Area 1.37  Aortic Value Area Index 0.6 cm2/BSA  RA A Wave 7 mmHg  RA V Wave 6 mmHg  RA Mean 4 mmHg  RV Systolic Pressure 29 mmHg  RV Diastolic Pressure 0 mmHg  RV EDP 6 mmHg  PA Systolic Pressure 23 mmHg  PA Diastolic Pressure 3 mmHg  PA Mean 11 mmHg  PW A Wave 6 mmHg  PW V Wave 5 mmHg  PW Mean 4 mmHg  AO Systolic Pressure 751 mmHg  AO Diastolic Pressure 62 mmHg  AO Mean 92 mmHg  LV Systolic Pressure 025 mmHg  LV Diastolic Pressure 11 mmHg  LV EDP 14 mmHg  Arterial Occlusion Pressure Extended Systolic Pressure 852 mmHg  Arterial Occlusion Pressure Extended Diastolic Pressure 64 mmHg  Arterial Occlusion Pressure Extended Mean Pressure 94 mmHg  Left Ventricular Apex Extended Systolic Pressure 778 mmHg  Left Ventricular Apex Extended Diastolic Pressure 9 mmHg  Left Ventricular Apex Extended EDP Pressure 16 mmHg  QP/QS 1  TPVR Index 3.29 HRUI  TSVR Index 27.46 HRUI  PVR SVR Ratio 0.08   TPVR/TSVR Ratio 0.12  TECHNIQUE:  Multidetector CT imaging of the chest was performed using the  standard protocol during bolus administration of intravenous  contrast. Multiplanar CT image reconstructions and MIPs were  obtained to evaluate the vascular anatomy.  CONTRAST: 171mL ISOVUE-370 IOPAMIDOL (ISOVUE-370) INJECTION 76%  COMPARISON: No comparison chest CT. Abdominopelvic CT 03/09/2016.  FINDINGS:  Cardiovascular: Calcified aortic valve. Calcified plaque throughout  thoracic aorta and origin of great vessels. Dilated ascending  thoracic aorta with proximal to mid ascending thoracic aorta  measuring up to 4.6 cm. Cardiac motion without dissection noted. At  the level of the sinus of Valsalva, transverse dimension of 4.8 cm.  At the sinotubular junction, transverse dimension of 4.1 cm. Aortic  arch with transverse dimension of 3.7 cm. Descending thoracic aorta  measures up to 3.5 cm.  Coronary artery calcifications. Heart size top-normal.  No pulmonary embolus.  Mediastinum/Nodes: No mediastinal/hilar adenopathy. No obvious  esophageal abnormality.  Lungs/Pleura: 6 mm pleural based nodule right lower lobe (series 6,  image 62). Inferior left lower lobe mild nodular parenchymal changes  measuring up to 8 x 6 mm (series 6, image 69). Calcified granuloma  peripheral aspect left upper lobe.  Scattered mild emphysematous changes.  Upper Abdomen: No worrisome abnormality.  Musculoskeletal: Ankylosis thoracic spine.  Review of the MIP images confirms the above findings.  IMPRESSION:  Calcified aortic valve. Dilated ascending thoracic aorta with  proximal to mid ascending thoracic aorta measuring up to 4.6 cm.  Sinus of Valsalva, transverse dimension of 4.8 cm. Sinotubular  junction, transverse dimension of 4.1 cm. Aortic arch transverse  dimension of 3.7 cm. Descending thoracic aorta measures up to 3.5  cm.  Coronary artery calcifications.  6 mm pleural based nodule right lower  lobe (series 6, image 62).  Inferior left lower lobe mild nodular parenchymal changes measuring  up to 8 x 6  mm (series 6, image 69). Non-contrast chest CT at 3-6  months is recommended. If the nodules are stable at time of repeat  CT, then future CT at 18-24 months (from today's scan) is considered  optional for low-risk patients, but is recommended for high-risk  patients. This recommendation follows the consensus statement:  Guidelines for Management of Incidental Pulmonary Nodules Detected  on CT Images: From the Fleischner Society 2017; Radiology 2017;  284:228-243.  Ankylosis thoracic spine.  Aortic Atherosclerosis (ICD10-I70.0).  Emphysema (ICD10-J43.9).  Electronically Signed  By: Genia Del M.D.  On: 07/24/2017 08:49    ADDENDUM REPORT: 09/05/2017 09:37  CLINICAL DATA:  84 year old male with severe aortic stenosis being evaluated for a TAVR procedure.  EXAM: Cardiac TAVR CT  TECHNIQUE: The patient was scanned on a [employer]. A 120 kV retrospective scan was triggered in the descending thoracic aorta at 111 HU's. Gantry rotation speed was 250 msecs and collimation was .6 mm. No beta blockade or nitro were given. The 3D data set was reconstructed in 5% intervals of the R-R cycle. Systolic and diastolic phases were analyzed on a dedicated work station using MPR, MIP and VRT modes. The patient received 80 cc of contrast.  FINDINGS: Aortic Valve: Trileaflet aortic valve with moderately thickened and calcified leaflets and severely restricted leaflet opening. No calcifications are extending into the LVOT.  Aorta: There is moderate ascending aortic aneurysm and also dilated aortic arch and descending thoracic aorta. Mild calcifications, no dissection.  Sinotubular Junction: 41 x 40 mm  Ascending Thoracic Aorta: 47 x 45 mm  Aortic Arch: 37 x 36 mm  Descending Thoracic Aorta: 36 x 34 mm  Sinus of Valsalva Measurements:  Non-coronary: 46  mm  Right -coronary: 44 mm  Left -coronary: 45 mm  Sinus of Valsalva Height:  Right -coronary: 22 mm  Left -coronary: 21 mm  Coronary Artery Height above Annulus:  Left Main: 14 mm  Right Coronary: 14 mm  Virtual Basal Annulus Measurements:  Maximum/Minimum Diameter: 32.7 x 25 mm  Mean Diameter: 28.6 mm  Perimeter: 92.4 mm  Area: 644 mm2  Optimum Fluoroscopic Angle for Delivery: LAO 13 CAU 6  IMPRESSION: 1. Trileaflet aortic valve with moderately thickened and calcified leaflets and severely restricted leaflet opening. Annular measurements suitable for delivery of a 29 mm Edwards SAPIEN 3 valve or a 34 mm Medtronic Evolut R TAVR valve.  2. Sufficient coronary to annulus distance.  3. Optimum Fluoroscopic Angle for Delivery:  LAO 13 CAU 6  4. No thrombus in the left atrial appendage.  5. There is moderately dilated ascending aortic aneurysm as well as mildly dilated aortic arch and descending thoracic aorta.  6. Dilated pulmonary artery measuring 36 mm suggestive of pulmonary hypertension.   Electronically Signed   By: Ena Dawley   On: 09/05/2017 09:37   CLINICAL DATA:  84 year old male with history of severe aortic stenosis. Preprocedural study prior to potential transcatheter aortic valve replacement (TAVR) procedure.  EXAM: CT ANGIOGRAPHY ABDOMEN AND PELVIS WITH CONTRAST AND WITHOUT CONTRAST  TECHNIQUE: Multidetector CT imaging of the abdomen and pelvis was performed using the standard protocol during bolus administration of intravenous contrast. Multiplanar reconstructed images and MIPs were obtained and reviewed to evaluate the vascular anatomy.  CONTRAST:  133mL ISOVUE-370 IOPAMIDOL (ISOVUE-370) INJECTION 76%  COMPARISON:  CT the abdomen and pelvis 03/09/2016.  FINDINGS: CTA ABDOMEN AND PELVIS FINDINGS  Lower thorax: Atherosclerotic calcifications in the left main, left anterior descending and right  coronary arteries. Severe thickening  calcification of the aortic valve.  Hepatobiliary: No suspicious cystic or solid hepatic lesions. No intra or extrahepatic biliary ductal dilatation. Gallbladder is normal in appearance.  Pancreas: No pancreatic mass. No pancreatic ductal dilatation. No pancreatic or peripancreatic fluid or inflammatory changes.  Spleen: Unremarkable.  Adrenals/Urinary Tract: Multiple nonobstructive calculi are noted within the collecting systems of both kidneys, the largest of which measures 8 mm in the interpolar region of the left kidney. Exophytic 2.9 cm low-attenuation lesion in the lower pole of the left kidney, compatible with a simple cyst. Right kidney and bilateral adrenal glands are normal in appearance. No hydroureteronephrosis. Urinary bladder is normal in appearance.  Stomach/Bowel: The appearance of the stomach is normal. No pathologic dilatation of small bowel or colon. Status post right hemicolectomy.  Vascular/Lymphatic: Aortic atherosclerosis, with vascular findings and measurements pertinent to potential TAVR procedure, as detailed below. No aneurysm or dissection noted in the abdominal or pelvic vasculature. Celiac axis, superior mesenteric artery and inferior mesenteric artery are all widely patent without hemodynamically significant stenosis. Single right and 2 left renal arteries are all widely patent without hemodynamically significant stenosis. No lymphadenopathy noted in the abdomen or pelvis.  Reproductive: Prostate gland and seminal vesicles are unremarkable in appearance.  Other: No significant volume of ascites.  No pneumoperitoneum.  Musculoskeletal: There are no aggressive appearing lytic or blastic lesions noted in the visualized portions of the skeleton.  VASCULAR MEASUREMENTS PERTINENT TO TAVR:  AORTA:  Minimal Aortic Diameter-24 x 24 mm  Severity of Aortic Calcification-moderate  RIGHT  PELVIS:  Right Common Iliac Artery -  Minimal Diameter-13.0 x 12.2 mm  Tortuosity-mild  Calcification-moderate  Right External Iliac Artery -  Minimal Diameter-11.6 x 10.1 mm  Tortuosity-moderate  Calcification-mild  Right Common Femoral Artery -  Minimal Diameter-11.5 x 10.8 mm  Tortuosity-mild  Calcification-mild  LEFT PELVIS:  Left Common Iliac Artery -  Minimal Diameter-11.2 x 13.1 mm  Tortuosity-mild  Calcification-moderate  Left External Iliac Artery -  Minimal Diameter-11.0 x 10.8 mm  Tortuosity-moderate  Calcification-mild  Left Common Femoral Artery -  Minimal Diameter-10.3 x 11.2 mm  Tortuosity-mild  Calcification-mild  Review of the MIP images confirms the above findings.  IMPRESSION: 1. Vascular findings and measurements pertinent to potential TAVR procedure, as detailed above. 2. Severe thickening calcification of the aortic valve, compatible with the reported clinical history of severe aortic stenosis. 3. Multiple nonobstructive calculi in the left renal collecting system measuring up to 8 mm in the interpolar region. 4. Additional incidental findings, as above.   Electronically Signed   By: Vinnie Langton M.D.   On: 09/04/2017 12:57   [facility] Adult Cardiac Surgery Database Version 2.9 RISK SCORES Procedure: Isolated AVR CALCULATE  Risk of Mortality:  1.485%   Renal Failure:  1.848%   Permanent Stroke:  1.741%   Prolonged Ventilation:  4.774%   DSW Infection:  0.107%   Reoperation:  4.040%   Morbidity or Mortality:  8.673%   Short Length of Stay:  41.811%   Long Length of Stay:  2.950%    Impression:   This 84 year old gentleman has stage D, moderate, symptomatic aortic stenosis with New York Heart Association class II symptoms of exertional fatigue and shortness of breath as well as orthopnea and lower extremity edema that have developed over the past several months consistent with chronic  diastolic congestive heart failure. 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 I have personally reviewed his most recent echocardiogram,cardiac catheterization, and CTA studies. The aortic valve is trileaflet with moderate calcification and and thickening of the leaflets with mildly restricted mobility. Although the mean gradient was measured up to 38 mmHg the valve area was calculated at 1.8 cm. There is mild to moderate aortic insufficiency with a pressure half-time of 470 ms. Cardiac catheterization shows large coronary arteries with no significant disease. The mean gradient measured at catheterization was 32 mmHg. His left ventricular systolic function is normal. His symptoms appear to be due to aortic stenosis although it is still technically in the moderate range. Aortic valve replacement may be the best treatment for him although that decision is complicated by the fact that he has enlargement of his aortic root and ascending aorta. His operative risk for open surgical aortic valve replacement would be fairly low although it is quite possible that he would require replacement of his aortic root and ascending aorta at the same time which would have a significantly higher risk at his age. His gated cardiac CTA shows anatomy suitable for transcatheter aortic valve replacement with no complicated features. The abdominal and pelvic CTA shows adequate pelvic arterial anatomy for transfemoral insertion. I reviewed the studies with him and his wife and the complexity of the situation. I have answered all their questions at this point. He would like to proceed with TAVR instead of open surgical repair.  The patient and his wife were counseled at length regarding treatment alternatives for management of severe symptomatic aortic stenosis. The risks and benefits of surgical intervention  has been discussed in detail. Long-term prognosis with medical therapy was discussed. Alternative approaches such as conventional surgical aortic valve replacement, transcatheter aortic valve replacement, and palliative medical therapy were compared and contrasted at length. This discussion was placed in the context of the patient's own specific clinical presentation and past medical history. All of their questions have been addressed.   Following the decision to proceed with transcatheter aortic valve replacement, a discussion was held regarding what types of management strategies would be attempted intraoperatively in the event of life-threatening complications, including whether or not the patient would be considered a candidate for the use of cardiopulmonary bypass and/or conversion to open sternotomy for attempted surgical intervention.   The patient has been advised of a variety of complications that might develop including but not limited to risks of death, stroke, paravalvular leak, aortic dissection or other major vascular complications, aortic annulus rupture, device embolization, cardiac rupture or perforation, mitral regurgitation, acute myocardial infarction, arrhythmia, heart block or bradycardia requiring permanent pacemaker placement, congestive heart failure, respiratory failure, renal failure, pneumonia, infection, other late complications related to structural valve deterioration or migration, or other complications that might ultimately cause a temporary or permanent loss of functional independence or other long term morbidity. The patient provides full informed consent for the procedure as described and all questions were answered.    Plan:   Transfemoral TAVR   Gaye Pollack, MD

## 2017-09-25 ENCOUNTER — Encounter (HOSPITAL_COMMUNITY)
Admission: RE | Disposition: A | Discharge status: Home / Self Care | Payer: Self-pay | Source: Home / Self Care | Attending: Cardiovascular Disease

## 2017-09-25 ENCOUNTER — Ambulatory Visit (HOSPITAL_COMMUNITY): Payer: PPO

## 2017-09-25 ENCOUNTER — Inpatient Hospital Stay (HOSPITAL_COMMUNITY): Payer: PPO | Admitting: Emergency Medicine

## 2017-09-25 ENCOUNTER — Inpatient Hospital Stay (HOSPITAL_COMMUNITY)
Admission: RE | Admit: 2017-09-25 | Discharge: 2017-09-27 | DRG: 267 | Disposition: A | Discharge status: Home / Self Care | Payer: PPO | Attending: Cardiovascular Disease | Admitting: Cardiovascular Disease

## 2017-09-25 ENCOUNTER — Encounter (HOSPITAL_COMMUNITY): Payer: Self-pay | Admitting: *Deleted

## 2017-09-25 ENCOUNTER — Inpatient Hospital Stay (HOSPITAL_COMMUNITY): Payer: PPO

## 2017-09-25 ENCOUNTER — Inpatient Hospital Stay (HOSPITAL_COMMUNITY): Payer: PPO | Admitting: Anesthesiology

## 2017-09-25 DIAGNOSIS — Z953 Presence of xenogenic heart valve: Secondary | ICD-10-CM | POA: Diagnosis not present

## 2017-09-25 DIAGNOSIS — M4324 Fusion of spine, thoracic region: Secondary | ICD-10-CM | POA: Diagnosis present

## 2017-09-25 DIAGNOSIS — Z006 Encounter for examination for normal comparison and control in clinical research program: Secondary | ICD-10-CM

## 2017-09-25 DIAGNOSIS — I712 Thoracic aortic aneurysm, without rupture: Secondary | ICD-10-CM | POA: Diagnosis not present

## 2017-09-25 DIAGNOSIS — J9811 Atelectasis: Secondary | ICD-10-CM | POA: Diagnosis not present

## 2017-09-25 DIAGNOSIS — Z8249 Family history of ischemic heart disease and other diseases of the circulatory system: Secondary | ICD-10-CM

## 2017-09-25 DIAGNOSIS — I361 Nonrheumatic tricuspid (valve) insufficiency: Secondary | ICD-10-CM | POA: Diagnosis not present

## 2017-09-25 DIAGNOSIS — M479 Spondylosis, unspecified: Secondary | ICD-10-CM | POA: Diagnosis present

## 2017-09-25 DIAGNOSIS — Z9841 Cataract extraction status, right eye: Secondary | ICD-10-CM

## 2017-09-25 DIAGNOSIS — E785 Hyperlipidemia, unspecified: Secondary | ICD-10-CM | POA: Diagnosis present

## 2017-09-25 DIAGNOSIS — Z87442 Personal history of urinary calculi: Secondary | ICD-10-CM

## 2017-09-25 DIAGNOSIS — I351 Nonrheumatic aortic (valve) insufficiency: Secondary | ICD-10-CM | POA: Diagnosis not present

## 2017-09-25 DIAGNOSIS — Z9842 Cataract extraction status, left eye: Secondary | ICD-10-CM | POA: Diagnosis not present

## 2017-09-25 DIAGNOSIS — Z7982 Long term (current) use of aspirin: Secondary | ICD-10-CM | POA: Diagnosis not present

## 2017-09-25 DIAGNOSIS — M109 Gout, unspecified: Secondary | ICD-10-CM | POA: Diagnosis not present

## 2017-09-25 DIAGNOSIS — Z79899 Other long term (current) drug therapy: Secondary | ICD-10-CM | POA: Diagnosis not present

## 2017-09-25 DIAGNOSIS — I272 Pulmonary hypertension, unspecified: Secondary | ICD-10-CM | POA: Diagnosis present

## 2017-09-25 DIAGNOSIS — Z9049 Acquired absence of other specified parts of digestive tract: Secondary | ICD-10-CM | POA: Diagnosis not present

## 2017-09-25 DIAGNOSIS — I110 Hypertensive heart disease with heart failure: Secondary | ICD-10-CM | POA: Diagnosis not present

## 2017-09-25 DIAGNOSIS — I350 Nonrheumatic aortic (valve) stenosis: Secondary | ICD-10-CM

## 2017-09-25 DIAGNOSIS — I10 Essential (primary) hypertension: Secondary | ICD-10-CM | POA: Diagnosis not present

## 2017-09-25 DIAGNOSIS — I071 Rheumatic tricuspid insufficiency: Secondary | ICD-10-CM | POA: Diagnosis present

## 2017-09-25 DIAGNOSIS — I2510 Atherosclerotic heart disease of native coronary artery without angina pectoris: Secondary | ICD-10-CM | POA: Diagnosis present

## 2017-09-25 DIAGNOSIS — I352 Nonrheumatic aortic (valve) stenosis with insufficiency: Principal | ICD-10-CM | POA: Diagnosis present

## 2017-09-25 DIAGNOSIS — Z96652 Presence of left artificial knee joint: Secondary | ICD-10-CM | POA: Diagnosis not present

## 2017-09-25 DIAGNOSIS — Z85828 Personal history of other malignant neoplasm of skin: Secondary | ICD-10-CM | POA: Diagnosis not present

## 2017-09-25 DIAGNOSIS — Z87891 Personal history of nicotine dependence: Secondary | ICD-10-CM

## 2017-09-25 DIAGNOSIS — Z85038 Personal history of other malignant neoplasm of large intestine: Secondary | ICD-10-CM | POA: Diagnosis not present

## 2017-09-25 DIAGNOSIS — R011 Cardiac murmur, unspecified: Secondary | ICD-10-CM | POA: Diagnosis not present

## 2017-09-25 DIAGNOSIS — Z961 Presence of intraocular lens: Secondary | ICD-10-CM | POA: Diagnosis present

## 2017-09-25 DIAGNOSIS — I5032 Chronic diastolic (congestive) heart failure: Secondary | ICD-10-CM | POA: Diagnosis not present

## 2017-09-25 HISTORY — PX: TEE WITHOUT CARDIOVERSION: SHX5443

## 2017-09-25 HISTORY — PX: TRANSCATHETER AORTIC VALVE REPLACEMENT, TRANSFEMORAL: SHX6400

## 2017-09-25 LAB — POCT I-STAT, CHEM 8
BUN: 12 mg/dL (ref 6–20)
BUN: 11 mg/dL (ref 6–20)
Calcium, Ion: 1.25 mmol/L (ref 1.15–1.40)
Calcium, Ion: 1.25 mmol/L (ref 1.15–1.40)
Chloride: 102 mmol/L (ref 101–111)
Chloride: 103 mmol/L (ref 101–111)
Creatinine, Ser: 0.8 mg/dL (ref 0.61–1.24)
Creatinine, Ser: 0.7 mg/dL (ref 0.61–1.24)
Glucose, Bld: 104 mg/dL — ABNORMAL HIGH (ref 65–99)
Glucose, Bld: 112 mg/dL — ABNORMAL HIGH (ref 65–99)
HCT: 39 % (ref 39.0–52.0)
HCT: 36 % — ABNORMAL LOW (ref 39.0–52.0)
Hemoglobin: 13.3 g/dL (ref 13.0–17.0)
Hemoglobin: 12.2 g/dL — ABNORMAL LOW (ref 13.0–17.0)
Potassium: 3.9 mmol/L (ref 3.5–5.1)
Potassium: 4.2 mmol/L (ref 3.5–5.1)
Sodium: 140 mmol/L (ref 135–145)
Sodium: 139 mmol/L (ref 135–145)
TCO2: 25 mmol/L (ref 22–32)
TCO2: 25 mmol/L (ref 22–32)

## 2017-09-25 LAB — CBC
HCT: 35.9 % — ABNORMAL LOW (ref 39.0–52.0)
Hemoglobin: 11.9 g/dL — ABNORMAL LOW (ref 13.0–17.0)
MCH: 34.2 pg — ABNORMAL HIGH (ref 26.0–34.0)
MCHC: 33.1 g/dL (ref 30.0–36.0)
MCV: 103.2 fL — ABNORMAL HIGH (ref 78.0–100.0)
Platelets: 184 10*3/uL (ref 150–400)
RBC: 3.48 MIL/uL — ABNORMAL LOW (ref 4.22–5.81)
RDW: 13.4 % (ref 11.5–15.5)
WBC: 7.6 10*3/uL (ref 4.0–10.5)

## 2017-09-25 LAB — PROTIME-INR
INR: 1.32
Prothrombin Time: 16.2 seconds — ABNORMAL HIGH (ref 11.4–15.2)

## 2017-09-25 LAB — APTT: aPTT: 40 seconds — ABNORMAL HIGH (ref 24–36)

## 2017-09-25 LAB — POCT I-STAT 4, (NA,K, GLUC, HGB,HCT)
Glucose, Bld: 109 mg/dL — ABNORMAL HIGH (ref 65–99)
HCT: 34 % — ABNORMAL LOW (ref 39.0–52.0)
Hemoglobin: 11.6 g/dL — ABNORMAL LOW (ref 13.0–17.0)
Potassium: 4.2 mmol/L (ref 3.5–5.1)
Sodium: 139 mmol/L (ref 135–145)

## 2017-09-25 SURGERY — IMPLANTATION, AORTIC VALVE, TRANSCATHETER, FEMORAL APPROACH
Anesthesia: General | Site: Chest

## 2017-09-25 MED ORDER — SODIUM CHLORIDE 0.9 % IV SOLN
INTRAVENOUS | Status: DC | PRN
  Administered 2017-09-25: 500 mL

## 2017-09-25 MED ORDER — ACETAMINOPHEN 325 MG PO TABS
650.0000 mg | ORAL_TABLET | Freq: Once | ORAL | Status: AC
  Administered 2017-09-25: 650 mg via ORAL
  Filled 2017-09-25: qty 2

## 2017-09-25 MED ORDER — DEXMEDETOMIDINE HCL IN NACL 200 MCG/50ML IV SOLN
INTRAVENOUS | Status: DC | PRN
  Administered 2017-09-25: 50 ug via INTRAVENOUS

## 2017-09-25 MED ORDER — LACTATED RINGERS IV SOLN
INTRAVENOUS | Status: DC | PRN
  Administered 2017-09-25: 16:00:00 via INTRAVENOUS

## 2017-09-25 MED ORDER — CLOPIDOGREL BISULFATE 75 MG PO TABS
75.0000 mg | ORAL_TABLET | Freq: Every day | ORAL | Status: DC
  Administered 2017-09-26 – 2017-09-27 (×2): 75 mg via ORAL
  Filled 2017-09-25 (×2): qty 1

## 2017-09-25 MED ORDER — CHLORHEXIDINE GLUCONATE 0.12 % MT SOLN
15.0000 mL | Freq: Once | OROMUCOSAL | Status: AC
  Administered 2017-09-25: 15 mL via OROMUCOSAL
  Filled 2017-09-25: qty 15

## 2017-09-25 MED ORDER — PROPOFOL 500 MG/50ML IV EMUL
INTRAVENOUS | Status: DC | PRN
  Administered 2017-09-25: 15 ug/kg/min via INTRAVENOUS

## 2017-09-25 MED ORDER — HEPARIN SODIUM (PORCINE) 1000 UNIT/ML IJ SOLN
INTRAMUSCULAR | Status: DC | PRN
  Administered 2017-09-25: 17000 [IU] via INTRAVENOUS

## 2017-09-25 MED ORDER — LIDOCAINE HCL (PF) 1 % IJ SOLN
INTRAMUSCULAR | Status: AC
  Filled 2017-09-25: qty 30

## 2017-09-25 MED ORDER — CHLORHEXIDINE GLUCONATE 4 % EX LIQD: 60.0000 mL | Freq: Once | CUTANEOUS | Status: DC

## 2017-09-25 MED ORDER — MIDAZOLAM HCL 2 MG/2ML IJ SOLN: 2.0000 mg | INTRAMUSCULAR | Status: DC | PRN

## 2017-09-25 MED ORDER — ASPIRIN EC 81 MG PO TBEC
81.0000 mg | DELAYED_RELEASE_TABLET | Freq: Every day | ORAL | Status: DC
  Administered 2017-09-26 – 2017-09-27 (×2): 81 mg via ORAL
  Filled 2017-09-25 (×2): qty 1

## 2017-09-25 MED ORDER — TRAMADOL HCL 50 MG PO TABS: 50.0000 mg | ORAL_TABLET | ORAL | Status: DC | PRN

## 2017-09-25 MED ORDER — OXYCODONE HCL 5 MG PO TABS: 5.0000 mg | ORAL_TABLET | ORAL | Status: DC | PRN

## 2017-09-25 MED ORDER — LABETALOL HCL 5 MG/ML IV SOLN
INTRAVENOUS | Status: AC
Start: 2017-09-25 — End: ?
  Filled 2017-09-25: qty 4

## 2017-09-25 MED ORDER — ALBUMIN HUMAN 5 % IV SOLN: 250.0000 mL | INTRAVENOUS | Status: DC | PRN

## 2017-09-25 MED ORDER — FENTANYL CITRATE (PF) 100 MCG/2ML IJ SOLN
75.0000 ug | Freq: Once | INTRAMUSCULAR | Status: AC
  Administered 2017-09-25: 75 ug via INTRAVENOUS

## 2017-09-25 MED ORDER — SODIUM CHLORIDE 0.9 % IV SOLN
INTRAVENOUS | Status: DC | PRN
  Administered 2017-09-25: 16:00:00 500 mL

## 2017-09-25 MED ORDER — ACETAMINOPHEN 500 MG PO TABS
1000.0000 mg | ORAL_TABLET | Freq: Four times a day (QID) | ORAL | Status: DC
  Administered 2017-09-26 – 2017-09-27 (×5): 1000 mg via ORAL
  Filled 2017-09-25 (×5): qty 2

## 2017-09-25 MED ORDER — VANCOMYCIN HCL IN DEXTROSE 1-5 GM/200ML-% IV SOLN
1000.0000 mg | Freq: Once | INTRAVENOUS | Status: AC
  Administered 2017-09-26: 1000 mg via INTRAVENOUS
  Filled 2017-09-25: qty 200

## 2017-09-25 MED ORDER — CEFAZOLIN SODIUM-DEXTROSE 2-4 GM/100ML-% IV SOLN
2.0000 g | Freq: Three times a day (TID) | INTRAVENOUS | Status: DC
  Administered 2017-09-25 – 2017-09-27 (×5): 2 g via INTRAVENOUS
  Filled 2017-09-25 (×7): qty 100

## 2017-09-25 MED ORDER — IODIXANOL 320 MG/ML IV SOLN
INTRAVENOUS | Status: DC | PRN
  Administered 2017-09-25: 132.8 mL via INTRAVENOUS

## 2017-09-25 MED ORDER — PROTAMINE SULFATE 10 MG/ML IV SOLN
INTRAVENOUS | Status: DC | PRN
  Administered 2017-09-25: 10 mg via INTRAVENOUS
  Administered 2017-09-25: 160 mg via INTRAVENOUS

## 2017-09-25 MED ORDER — ACETAMINOPHEN 160 MG/5ML PO SOLN: 1000.0000 mg | Freq: Four times a day (QID) | ORAL | Status: DC

## 2017-09-25 MED ORDER — ACETAMINOPHEN 160 MG/5ML PO SOLN: 650.0000 mg | Freq: Once | ORAL | Status: DC

## 2017-09-25 MED ORDER — SODIUM CHLORIDE 0.9 % IV SOLN
INTRAVENOUS | Status: AC
  Administered 2017-09-25: 18:00:00 via INTRAVENOUS

## 2017-09-25 MED ORDER — AMLODIPINE BESYLATE 10 MG PO TABS
10.0000 mg | ORAL_TABLET | Freq: Every day | ORAL | Status: DC
  Administered 2017-09-26 – 2017-09-27 (×2): 10 mg via ORAL
  Filled 2017-09-25 (×2): qty 1

## 2017-09-25 MED ORDER — FENTANYL CITRATE (PF) 100 MCG/2ML IJ SOLN
INTRAMUSCULAR | Status: DC | PRN
  Administered 2017-09-25 (×4): 25 ug via INTRAVENOUS

## 2017-09-25 MED ORDER — ACETAMINOPHEN 650 MG RE SUPP
650.0000 mg | Freq: Once | RECTAL | Status: DC
  Filled 2017-09-25: qty 1

## 2017-09-25 MED ORDER — SODIUM CHLORIDE 0.9 % IV SOLN
INTRAVENOUS | Status: AC
  Filled 2017-09-25 (×3): qty 1.2

## 2017-09-25 MED ORDER — ONDANSETRON HCL 4 MG/2ML IJ SOLN
INTRAMUSCULAR | Status: DC | PRN
  Administered 2017-09-25: 4 mg via INTRAVENOUS

## 2017-09-25 MED ORDER — POTASSIUM CHLORIDE CRYS ER 20 MEQ PO TBCR
20.0000 meq | EXTENDED_RELEASE_TABLET | Freq: Every day | ORAL | Status: DC
  Administered 2017-09-26 – 2017-09-27 (×2): 20 meq via ORAL
  Filled 2017-09-25 (×2): qty 1

## 2017-09-25 MED ORDER — ALLOPURINOL 300 MG PO TABS
300.0000 mg | ORAL_TABLET | Freq: Every day | ORAL | Status: DC
  Administered 2017-09-26 – 2017-09-27 (×2): 300 mg via ORAL
  Filled 2017-09-25 (×2): qty 1

## 2017-09-25 MED ORDER — LACTATED RINGERS IV SOLN
INTRAVENOUS | Status: DC
  Administered 2017-09-25: 15:00:00 via INTRAVENOUS

## 2017-09-25 MED ORDER — 0.9 % SODIUM CHLORIDE (POUR BTL) OPTIME
TOPICAL | Status: DC | PRN
  Administered 2017-09-25: 1000 mL

## 2017-09-25 MED ORDER — MORPHINE SULFATE (PF) 2 MG/ML IV SOLN: 2.0000 mg | INTRAVENOUS | Status: DC | PRN

## 2017-09-25 MED ORDER — FENTANYL CITRATE (PF) 100 MCG/2ML IJ SOLN
INTRAMUSCULAR | Status: AC
  Administered 2017-09-25: 75 ug via INTRAVENOUS
  Filled 2017-09-25: qty 2

## 2017-09-25 MED ORDER — SODIUM CHLORIDE 0.9 % IV SOLN: INTRAVENOUS | Status: DC

## 2017-09-25 MED ORDER — LACTATED RINGERS IV SOLN: 500.0000 mL | Freq: Once | INTRAVENOUS | Status: DC | PRN

## 2017-09-25 MED ORDER — LIDOCAINE HCL (PF) 1 % IJ SOLN
INTRAMUSCULAR | Status: DC | PRN
  Administered 2017-09-25: 14 mL

## 2017-09-25 MED ORDER — ORAL CARE MOUTH RINSE
15.0000 mL | Freq: Two times a day (BID) | OROMUCOSAL | Status: DC
  Administered 2017-09-26 – 2017-09-27 (×3): 15 mL via OROMUCOSAL

## 2017-09-25 MED ORDER — FENTANYL CITRATE (PF) 250 MCG/5ML IJ SOLN
INTRAMUSCULAR | Status: AC
  Filled 2017-09-25: qty 5

## 2017-09-25 MED ORDER — QUINAPRIL HCL 10 MG PO TABS
40.0000 mg | ORAL_TABLET | Freq: Every day | ORAL | Status: DC
  Administered 2017-09-26 – 2017-09-27 (×2): 40 mg via ORAL
  Filled 2017-09-25 (×2): qty 4

## 2017-09-25 MED ORDER — PHENYLEPHRINE HCL 10 MG/ML IJ SOLN
INTRAVENOUS | Status: DC | PRN
  Administered 2017-09-25: 50 ug/min via INTRAVENOUS

## 2017-09-25 MED ORDER — FAMOTIDINE IN NACL 20-0.9 MG/50ML-% IV SOLN
20.0000 mg | Freq: Two times a day (BID) | INTRAVENOUS | Status: AC
  Administered 2017-09-26: 20 mg via INTRAVENOUS
  Filled 2017-09-25: qty 50

## 2017-09-25 MED ORDER — PANTOPRAZOLE SODIUM 40 MG PO TBEC
40.0000 mg | DELAYED_RELEASE_TABLET | Freq: Every day | ORAL | Status: DC
  Administered 2017-09-27: 40 mg via ORAL
  Filled 2017-09-25: qty 1

## 2017-09-25 MED ORDER — FUROSEMIDE 20 MG PO TABS
20.0000 mg | ORAL_TABLET | Freq: Every day | ORAL | Status: DC
  Administered 2017-09-26 – 2017-09-27 (×2): 20 mg via ORAL
  Filled 2017-09-25 (×2): qty 1

## 2017-09-25 MED ORDER — METOPROLOL TARTRATE 5 MG/5ML IV SOLN: 2.5000 mg | INTRAVENOUS | Status: DC | PRN

## 2017-09-25 MED ORDER — CHLORHEXIDINE GLUCONATE 4 % EX LIQD: 30.0000 mL | CUTANEOUS | Status: DC

## 2017-09-25 MED ORDER — ONDANSETRON HCL 4 MG/2ML IJ SOLN: 4.0000 mg | Freq: Four times a day (QID) | INTRAMUSCULAR | Status: DC | PRN

## 2017-09-25 SURGICAL SUPPLY — 93 items
ADH SKN CLS APL DERMABOND .7 (GAUZE/BANDAGES/DRESSINGS) ×2
BAG DECANTER FOR FLEXI CONT (MISCELLANEOUS) IMPLANT
BAG SNAP BAND KOVER 36X36 (MISCELLANEOUS) ×7 IMPLANT
BLADE CLIPPER SURG (BLADE) IMPLANT
BLADE OSCILLATING /SAGITTAL (BLADE) IMPLANT
BLADE STERNUM SYSTEM 6 (BLADE) ×2 IMPLANT
CABLE ADAPT CONN TEMP 6FT (ADAPTER) ×3 IMPLANT
CANNULA FEM VENOUS REMOTE 22FR (CANNULA) IMPLANT
CANNULA OPTISITE PERFUSION 16F (CANNULA) IMPLANT
CANNULA OPTISITE PERFUSION 18F (CANNULA) IMPLANT
CATH DIAG EXPO 6F VENT PIG 145 (CATHETERS) ×6 IMPLANT
CATH EXPO 5FR AL1 (CATHETERS) ×4 IMPLANT
CATH INFINITI 6F AL2 (CATHETERS) ×3 IMPLANT
CATH S G BIP PACING (SET/KITS/TRAYS/PACK) ×3 IMPLANT
CLIP VESOCCLUDE MED 24/CT (CLIP) IMPLANT
CLIP VESOCCLUDE SM WIDE 24/CT (CLIP) IMPLANT
CONT SPEC 4OZ CLIKSEAL STRL BL (MISCELLANEOUS) ×5 IMPLANT
COVER BACK TABLE 24X17X13 BIG (DRAPES) ×4 IMPLANT
COVER BACK TABLE 80X110 HD (DRAPES) ×4 IMPLANT
COVER DOME SNAP 22 D (MISCELLANEOUS) ×6 IMPLANT
COVER PROBE W GEL 5X96 (DRAPES) ×2 IMPLANT
CRADLE DONUT ADULT HEAD (MISCELLANEOUS) ×3 IMPLANT
DERMABOND ADVANCED (GAUZE/BANDAGES/DRESSINGS) ×1
DERMABOND ADVANCED .7 DNX12 (GAUZE/BANDAGES/DRESSINGS) ×2 IMPLANT
DEVICE CLOSURE PERCLS PRGLD 6F (VASCULAR PRODUCTS) ×4 IMPLANT
DRAPE INCISE IOBAN 66X45 STRL (DRAPES) IMPLANT
DRSG TEGADERM 4X4.75 (GAUZE/BANDAGES/DRESSINGS) ×3 IMPLANT
ELECT CAUTERY BLADE 6.4 (BLADE) ×2 IMPLANT
ELECT REM PT RETURN 9FT ADLT (ELECTROSURGICAL) ×6
ELECTRODE REM PT RTRN 9FT ADLT (ELECTROSURGICAL) ×4 IMPLANT
FELT TEFLON 6X6 (MISCELLANEOUS) IMPLANT
FEMORAL VENOUS CANN RAP (CANNULA) IMPLANT
GAUZE SPONGE 2X2 8PLY NS (GAUZE/BANDAGES/DRESSINGS) ×1 IMPLANT
GAUZE SPONGE 4X4 12PLY STRL (GAUZE/BANDAGES/DRESSINGS) ×4 IMPLANT
GLOVE BIO SURGEON STRL SZ7.5 (GLOVE) ×3 IMPLANT
GLOVE BIO SURGEON STRL SZ8 (GLOVE) ×3 IMPLANT
GLOVE EUDERMIC 7 POWDERFREE (GLOVE) ×5 IMPLANT
GLOVE ORTHO TXT STRL SZ7.5 (GLOVE) ×6 IMPLANT
GOWN STRL REUS W/ TWL LRG LVL3 (GOWN DISPOSABLE) ×6 IMPLANT
GOWN STRL REUS W/ TWL XL LVL3 (GOWN DISPOSABLE) ×6 IMPLANT
GOWN STRL REUS W/TWL LRG LVL3 (GOWN DISPOSABLE) ×9
GOWN STRL REUS W/TWL XL LVL3 (GOWN DISPOSABLE) ×9
GUIDEWIRE SAFE TJ AMPLATZ EXST (WIRE) ×3 IMPLANT
GUIDEWIRE STRAIGHT .035 260CM (WIRE) ×3 IMPLANT
INSERT FOGARTY SM (MISCELLANEOUS) IMPLANT
KIT BASIN OR (CUSTOM PROCEDURE TRAY) ×3 IMPLANT
KIT DILATOR VASC 18G NDL (KITS) IMPLANT
KIT HEART LEFT (KITS) ×3 IMPLANT
KIT SUCTION CATH 14FR (SUCTIONS) IMPLANT
KIT TURNOVER KIT B (KITS) ×3 IMPLANT
LOOP VESSEL MAXI BLUE (MISCELLANEOUS) IMPLANT
LOOP VESSEL MINI RED (MISCELLANEOUS) IMPLANT
NDL PERC 18GX7CM (NEEDLE) ×2 IMPLANT
NEEDLE 22X1 1/2 (OR ONLY) (NEEDLE) IMPLANT
NEEDLE PERC 18GX7CM (NEEDLE) ×3 IMPLANT
NS IRRIG 1000ML POUR BTL (IV SOLUTION) ×9 IMPLANT
PACK ENDOVASCULAR (PACKS) ×3 IMPLANT
PAD ARMBOARD 7.5X6 YLW CONV (MISCELLANEOUS) ×6 IMPLANT
PAD ELECT DEFIB RADIOL ZOLL (MISCELLANEOUS) ×3 IMPLANT
PENCIL BUTTON HOLSTER BLD 10FT (ELECTRODE) ×3 IMPLANT
PERCLOSE PROGLIDE 6F (VASCULAR PRODUCTS) ×12
SET MICROPUNCTURE 5F STIFF (MISCELLANEOUS) ×3 IMPLANT
SHEATH BRITE TIP 6FR 35CM (SHEATH) ×1 IMPLANT
SHEATH PINNACLE 6F 10CM (SHEATH) ×5 IMPLANT
SHEATH PINNACLE 8F 10CM (SHEATH) ×3 IMPLANT
SLEEVE REPOSITIONING LENGTH 30 (MISCELLANEOUS) ×4 IMPLANT
SPONGE LAP 4X18 X RAY DECT (DISPOSABLE) IMPLANT
STOPCOCK MORSE 400PSI 3WAY (MISCELLANEOUS) ×7 IMPLANT
SUT ETHIBOND X763 2 0 SH 1 (SUTURE) ×3 IMPLANT
SUT GORETEX CV 4 TH 22 36 (SUTURE) ×3 IMPLANT
SUT GORETEX CV4 TH-18 (SUTURE) ×9 IMPLANT
SUT MNCRL AB 3-0 PS2 18 (SUTURE) ×2 IMPLANT
SUT PROLENE 5 0 C 1 36 (SUTURE) ×4 IMPLANT
SUT PROLENE 6 0 C 1 30 (SUTURE) ×6 IMPLANT
SUT SILK  1 MH (SUTURE) ×1
SUT SILK 1 MH (SUTURE) ×2 IMPLANT
SUT VIC AB 2-0 CT1 27 (SUTURE) ×3
SUT VIC AB 2-0 CT1 TAPERPNT 27 (SUTURE) ×2 IMPLANT
SUT VIC AB 2-0 CTX 36 (SUTURE) IMPLANT
SUT VIC AB 3-0 SH 8-18 (SUTURE) ×6 IMPLANT
SYR 50ML LL SCALE MARK (SYRINGE) ×3 IMPLANT
SYR BULB IRRIGATION 50ML (SYRINGE) IMPLANT
SYR CONTROL 10ML LL (SYRINGE) IMPLANT
TAPE CLOTH SURG 4X10 WHT LF (GAUZE/BANDAGES/DRESSINGS) ×1 IMPLANT
TOWEL GREEN STERILE (TOWEL DISPOSABLE) ×6 IMPLANT
TOWEL OR NON WOVEN STRL DISP B (DISPOSABLE) ×3 IMPLANT
TRANSDUCER W/STOPCOCK (MISCELLANEOUS) ×6 IMPLANT
TRAY FOLEY SILVER 16FR TEMP (SET/KITS/TRAYS/PACK) ×2 IMPLANT
VALVE HEART TRANSCATH SZ3 29MM (Prosthesis & Implant Heart) ×1 IMPLANT
WIRE .035 3MM-J 145CM (WIRE) ×3 IMPLANT
WIRE AMPLATZ SS-J .035X180CM (WIRE) ×4 IMPLANT
WIRE EMERALD ST .035X260CM (WIRE) ×1 IMPLANT
YANKAUER SUCT BULB TIP NO VENT (SUCTIONS) ×2 IMPLANT

## 2017-09-25 NOTE — CV Procedure (Signed)
[facility]  TAVR OPERATIVE NOTE   Date of Procedure:  09/25/2017  Preoperative Diagnosis: Severe Aortic Stenosis   Postoperative Diagnosis: Same   Procedure:    Transcatheter Aortic Valve Replacement - Transfemoral Approach  Edwards Sapien 3 THV (size 29 mm, model # [id number], serial # [id number])   Co-Surgeons:  Lauree Chandler, MD and Gaye Pollack, MD   Anesthesiologist:  Massagee  Echocardiographer:  Johnsie Cancel  Pre-operative Echo Findings:  Severe aortic stenosis  Normal left ventricular systolic function  Post-operative Echo Findings:  No paravalvular leak  Normal left ventricular systolic function  BRIEF CLINICAL NOTE AND INDICATIONS FOR SURGERY  84 yo male with history of HTN, HLD, aortic stenosis, aortic insufficiency here today for TAVR. I saw him as a new patient August 2015. He has been known to have AS for many years. He has a history of rheumatic fever as a child with murmur noted his entire life. His aortic stenosis has been moderate. Echo in May 2018 showed normal LVEF, severe LVH, moderate AS (mean gradient 33 mmHg) mild AI, mildly dilated aortic root. I saw him 07/04/17 and he c/o worsened dyspnea. Echo 07/12/17 showed normal LV systolic function with LMBE=67-54%. His aortic valve was thickened and calcified with DVI of 0.31, mean gradient 38 mmHg and AVA estimated over 1.5 cm2. His aortic root was enlarged. Chest CTA 07/23/17 with dilated ascending thoracic aorta with proximal to mid ascending thoracic aorta up to 4.6 cm. Sinus of Valsalva with transverse dimension of 4.8 cm. 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 The Diagonal has a moderate ostial stenosis.   During the course of the patient's preoperative work up they have been evaluated comprehensively by a multidisciplinary team of specialists coordinated through the [facility] in the [facility].  They  have been demonstrated to suffer from symptomatic severe aortic stenosis as noted above. The patient has been counseled extensively as to the relative risks and benefits of all options for the treatment of severe aortic stenosis including long term medical therapy, conventional surgery for aortic valve replacement, and transcatheter aortic valve replacement.  The patient has been independently evaluated by two cardiac surgeons including Dr Roxy Manns and Dr. Cyndia Bent, and they are felt to be at high risk for conventional surgical aortic valve replacement. Both surgeons indicated the patient would be a poor candidate for conventional surgery. Based upon review of all of the patient's preoperative diagnostic tests they are felt to be candidate for transcatheter aortic valve replacement using the transfemoral approach as an alternative to high risk conventional surgery.    Following the decision to proceed with transcatheter aortic valve replacement, a discussion has been held regarding what types of management strategies would be attempted intraoperatively in the event of life-threatening complications, including whether or not the patient would be considered a candidate for the use of cardiopulmonary bypass and/or conversion to open sternotomy for attempted surgical intervention.  The patient has been advised of a variety of complications that might develop peculiar to this approach including but not limited to risks of death, stroke, paravalvular leak, aortic dissection or other major vascular complications, aortic annulus rupture, device embolization, cardiac rupture or perforation, acute myocardial infarction, arrhythmia, heart block or bradycardia requiring permanent pacemaker placement, congestive heart failure, respiratory failure, renal failure, pneumonia, infection, other late complications related to structural valve deterioration or migration, or other complications that might ultimately cause a temporary or  permanent loss of functional  independence or other long term morbidity.  The patient provides full informed consent for the procedure as described and all questions were answered preoperatively.    DETAILS OF THE OPERATIVE PROCEDURE  PREPARATION:   The patient is brought to the operating room on the above mentioned date and central monitoring was established by the anesthesia team including placement of a radial arterial line. The patient is placed in the supine position on the operating table.  Intravenous antibiotics are administered. Conscious sedation is used.   Baseline transthoracic echocardiogram was performed. The patient's chest, abdomen, both groins, and both lower extremities are prepared and draped in a sterile manner. A time out procedure is performed.   PERIPHERAL ACCESS:   Using the modified Seldinger technique, femoral arterial and venous access were obtained with placement of 6 Fr sheaths on the left side using u/s guidance.  A pigtail diagnostic catheter was passed through the femoral arterial sheath under fluoroscopic guidance into the aortic root.  A temporary transvenous pacemaker catheter was passed through the femoral venous sheath under fluoroscopic guidance into the right ventricle.  The pacemaker was tested to ensure stable lead placement and pacemaker capture. Aortic root angiography was performed in order to determine the optimal angiographic angle for valve deployment.  TRANSFEMORAL ACCESS:  A micropuncture kit was used to gain access to the right femoral artery using u/s guidance. Position confirmed with angiography. Pre-closure with double ProGlide closure devices. The patient was heparinized systemically and ACT verified > 250 seconds.    A 16 Fr transfemoral E-sheath was introduced into the right femoral artery after progressively dilating over an Amplatz superstiff wire. An AL-1catheter was used to direct a straight-tip exchange length wire across the native aortic  valve into the left ventricle. This was exchanged out for a pigtail catheter and position was confirmed in the LV apex. Simultaneous LV and Ao pressures were recorded.  The pigtail catheter was then exchanged for an Amplatz Extra-stiff wire in the LV apex.   TRANSCATHETER HEART VALVE DEPLOYMENT:  An Edwards Sapien 3 THV (size 29 mm) was prepared and crimped per manufacturer's guidelines, and the proper orientation of the valve is confirmed on the [employer] delivery system. The valve was advanced through the introducer sheath using normal technique until in an appropriate position in the abdominal aorta beyond the sheath tip. The balloon was then retracted and using the fine-tuning wheel was centered on the valve. The valve was then advanced across the aortic arch using appropriate flexion of the catheter. The valve was carefully positioned across the aortic valve annulus. The Commander catheter was retracted using normal technique. Once final position of the valve has been confirmed by angiographic assessment, the valve is deployed while temporarily holding ventilation and during rapid ventricular pacing to maintain systolic blood pressure < 50 mmHg and pulse pressure < 10 mmHg. The balloon inflation is held for >3 seconds after reaching full deployment volume. Once the balloon has fully deflated the balloon is retracted into the ascending aorta and valve function is assessed using TTE. There is felt to be no paravalvular leak and no central aortic insufficiency.  The patient's hemodynamic recovery following valve deployment is good.  The deployment balloon and guidewire are both removed. Echo demostrated acceptable post-procedural gradients, stable mitral valve function, and no AI.   PROCEDURE COMPLETION:  The sheath was then removed and closure devices were completed. Protamine was administered once femoral arterial repair was complete. The temporary pacemaker, pigtail catheters and femoral sheaths  were  removed with manual pressure used for hemostasis.   The patient tolerated the procedure well and is transported to the surgical intensive care in stable condition. There were no immediate intraoperative complications. All sponge instrument and needle counts are verified correct at completion of the operation.   No blood products were administered during the operation.  The patient received a total of 132.8 mL of intravenous contrast during the procedure.  Lauree Chandler MD 09/25/2017 8:04 PM

## 2017-09-25 NOTE — Anesthesia Procedure Notes (Addendum)
Central Venous Catheter Insertion Performed by: Rica Koyanagi, MD, anesthesiologist Start/End4/02/2018 2:55 PM, 09/25/2017 3:13 PM Preanesthetic checklist: patient identified, IV checked, site marked, risks and benefits discussed, surgical consent, monitors and equipment checked, pre-op evaluation and timeout performed Position: Trendelenburg Lidocaine 1% used for infiltration and patient sedated Hand hygiene performed , maximum sterile barriers used  and Seldinger technique used Catheter size: 8 Fr Central line was placed.Double lumen Swan type:thermodilution Procedure performed using ultrasound guided technique. Ultrasound Notes:anatomy identified, needle tip was noted to be adjacent to the nerve/plexus identified, no ultrasound evidence of intravascular and/or intraneural injection and image(s) printed for medical record Attempts: 1 Following insertion, line sutured, dressing applied and Biopatch. Post procedure assessment: blood return through all ports, free fluid flow and no air  Patient tolerated the procedure well with no immediate complications.

## 2017-09-25 NOTE — Progress Notes (Signed)
  Echocardiogram 2D Echocardiogram has been performed.  Barry Decker 09/25/2017, 5:41 PM

## 2017-09-25 NOTE — Anesthesia Preprocedure Evaluation (Signed)
Anesthesia Evaluation  Patient identified by MRN, date of birth, ID band Patient awake    Reviewed: Allergy & Precautions, NPO status , Patient's Chart, lab work & pertinent test results  History of Anesthesia Complications Negative for: history of anesthetic complications  Airway Mallampati: II  TM Distance: >3 FB Neck ROM: Full    Dental no notable dental hx.    Pulmonary former smoker,    breath sounds clear to auscultation       Cardiovascular hypertension, +CHF  + Valvular Problems/Murmurs AS  Rhythm:Regular Rate:Normal + Systolic murmurs    Neuro/Psych negative psych ROS   GI/Hepatic negative GI ROS, Neg liver ROS,   Endo/Other    Renal/GU      Musculoskeletal  (+) Arthritis ,   Abdominal   Peds  Hematology negative hematology ROS (+)   Anesthesia Other Findings   Reproductive/Obstetrics                             Anesthesia Physical Anesthesia Plan  ASA: III  Anesthesia Plan: General   Post-op Pain Management:    Induction:   PONV Risk Score and Plan: 2 and Treatment may vary due to age or medical condition  Airway Management Planned: Natural Airway and Simple Face Mask  Additional Equipment: Arterial line, CVP and Ultrasound Guidance Line Placement  Intra-op Plan:   Post-operative Plan:   Informed Consent: I have reviewed the patients History and Physical, chart, labs and discussed the procedure including the risks, benefits and alternatives for the proposed anesthesia with the patient or authorized representative who has indicated his/her understanding and acceptance.     Plan Discussed with: CRNA  Anesthesia Plan Comments:         Anesthesia Quick Evaluation

## 2017-09-25 NOTE — Transfer of Care (Signed)
Immediate Anesthesia Transfer of Care Note  Patient: Barry Decker  Procedure(s) Performed: TRANSCATHETER AORTIC VALVE REPLACEMENT, TRANSFEMORAL (N/A Chest) TRANSESOPHAGEAL ECHOCARDIOGRAM (TEE) (N/A )  Patient Location: ICU  Anesthesia Type:MAC  Level of Consciousness: awake, alert  and patient cooperative  Airway & Oxygen Therapy: Patient Spontanous Breathing and Patient connected to nasal cannula oxygen  Post-op Assessment: Report given to RN and Post -op Vital signs reviewed and stable  Post vital signs: Reviewed and stable  Last Vitals:  Vitals Value Taken Time  BP    Temp    Pulse 50 09/25/2017  5:54 PM  Resp 26 09/25/2017  5:54 PM  SpO2 99 % 09/25/2017  5:54 PM  Vitals shown include unvalidated device data.  Last Pain:  Vitals:   09/25/17 1415  TempSrc: Oral         Complications: No apparent anesthesia complications

## 2017-09-25 NOTE — Progress Notes (Signed)
[facility] BRIEF SICU PROGRESS NOTE  Day of Surgery  S/P Procedure(s) (LRB): TRANSCATHETER AORTIC VALVE REPLACEMENT, TRANSFEMORAL (N/A) TRANSESOPHAGEAL ECHOCARDIOGRAM (TEE) (N/A)   Doing well No pain NSR w/ stable BP Both groins okay  Plan: Continue routine post TAVR  Rexene Alberts, MD 09/25/2017 6:44 PM

## 2017-09-25 NOTE — Interval H&P Note (Signed)
History and Physical Interval Note:  09/25/2017 2:51 PM  [name]  has presented today for surgery, with the diagnosis of Severe Aortic Stenosis  The various methods of treatment have been discussed with the patient and family. After consideration of risks, benefits and other options for treatment, the patient has consented to  Procedure(s): TRANSCATHETER AORTIC VALVE REPLACEMENT, TRANSFEMORAL (N/A) TRANSESOPHAGEAL ECHOCARDIOGRAM (TEE) (N/A) as a surgical intervention .  The patient's history has been reviewed, patient examined, no change in status, stable for surgery.  I have reviewed the patient's chart and labs.  Questions were answered to the patient's satisfaction.     Gaye Pollack

## 2017-09-25 NOTE — Anesthesia Postprocedure Evaluation (Signed)
Anesthesia Post Note  Patient: Barry Decker  Procedure(s) Performed: TRANSCATHETER AORTIC VALVE REPLACEMENT, TRANSFEMORAL (N/A Chest) TRANSESOPHAGEAL ECHOCARDIOGRAM (TEE) (N/A )     Patient location during evaluation: SICU Anesthesia Type: General Level of consciousness: awake and alert Pain management: pain level controlled Vital Signs Assessment: post-procedure vital signs reviewed and stable Respiratory status: spontaneous breathing, nonlabored ventilation, respiratory function stable and patient connected to nasal cannula oxygen Cardiovascular status: stable and blood pressure returned to baseline Postop Assessment: no apparent nausea or vomiting Anesthetic complications: no    Last Vitals:  Vitals:   09/25/17 1900 09/25/17 2000  BP: 113/63   Pulse: (!) 49   Resp: 16   Temp:  36.5 C  SpO2: 99%     Last Pain:  Vitals:   09/25/17 2000  TempSrc: Oral  PainSc:                  Baruc Tugwell COKER

## 2017-09-25 NOTE — Op Note (Signed)
[facility]   MULTIDISCIPLINARY HEART VALVE TEAM   TAVR OPERATIVE NOTE   Date of Procedure:  09/25/2017  Preoperative Diagnosis: Severe Aortic Stenosis   Postoperative Diagnosis: Same   Procedure:    Transcatheter Aortic Valve Replacement - Percutaneous Right Transfemoral Approach  Edwards Sapien 3 THV (size 29 mm, model # 9600TFX, serial # [id number])   Co-Surgeons:  Gaye Pollack, MD and Lauree Chandler, MD   Anesthesiologist:  Roberts Gaudy, MD  Echocardiographer:  Jenkins Rouge, MD  Pre-operative Echo Findings:  Severe aortic stenosis  Normal left ventricular systolic function  Post-operative Echo Findings:  No paravalvular leak  Normal left ventricular systolic function   BRIEF CLINICAL NOTE AND INDICATIONS FOR SURGERY  This 84 year old gentleman has stage D, moderate, symptomatic aortic stenosis with New York Heart Association class II symptoms of exertional fatigue and shortness of breath as well as orthopnea and lower extremity edema that have developed over the past several months consistent with chronic diastolic congestive heart failure. 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 I have personally reviewed his most recent echocardiogram,cardiac catheterization, and CTA studies. The aortic valve is trileaflet with moderate calcification and and thickening of the leaflets with mildly restricted mobility. Although the mean gradient was measured up to 38 mmHg the valve area was calculated at 1.8 cm. There is mild to moderate aortic insufficiency with a pressure half-time of 470 ms. Cardiac catheterization shows large coronary arteries with no significant disease. The mean gradient measured at catheterization was 32 mmHg. His left ventricular systolic function is normal. His symptoms appear to be due to aortic stenosis  although it is still technically in the moderate range. Aortic valve replacement may be the best treatment for him although that decision is complicated by the fact that he has enlargement of his aortic root and ascending aorta. His operative risk for open surgical aortic valve replacement would be fairly low although it is quite possible that he would require replacement of his aortic root and ascending aorta at the same time which would have a significantly higher risk at his age. His gated cardiac CTA shows anatomy suitable for transcatheter aortic valve replacement with no complicated features. The abdominal and pelvic CTA shows adequate pelvic arterial anatomy for transfemoral insertion. I reviewed the studies with him and his wife and the complexity of the situation. I have answered all their questions at this point. He would like to proceed with TAVR instead of open surgical repair.  The patient and his wife were counseled at length regarding treatment alternatives for management of severe symptomatic aortic stenosis. The risks and benefits of surgical intervention has been discussed in detail. Long-term prognosis with medical therapy was discussed. Alternative approaches such as conventional surgical aortic valve replacement, transcatheter aortic valve replacement, and palliative medical therapy were compared and contrasted at length. This discussion was placed in the context of the patient's own specific clinical presentation and past medical history. All of their questions have been addressed.   Following the decision to proceed with transcatheter aortic valve replacement, a discussion was held regarding what types of management strategies would be attempted intraoperatively in the event of life-threatening complications, including whether or not the patient would be considered a candidate for the use of cardiopulmonary bypass and/or conversion to open sternotomy for attempted surgical intervention.  The patient has been advised of a variety of complications that might develop including but not limited to risks of death, stroke, paravalvular leak, aortic dissection or other major vascular complications, aortic annulus rupture, device embolization, cardiac rupture or perforation, mitral regurgitation, acute myocardial infarction, arrhythmia, heart block or bradycardia requiring permanent pacemaker placement, congestive heart failure, respiratory failure, renal failure, pneumonia, infection, other late complications related to structural valve deterioration or migration, or other complications that might ultimately cause a temporary or permanent loss of functional independence or other long term morbidity. The patient provides full informed consent for the procedure as described and all questions were answered.     DETAILS OF THE OPERATIVE PROCEDURE  PREPARATION:    The patient is brought to the operating room on the above mentioned date and central monitoring was established by the anesthesia team including placement of a central venous line and radial arterial line. The patient is placed in the supine position on the operating table.  Intravenous antibiotics are administered. The patient is monitored closely throughout the procedure under conscious sedation.   Baseline transthoracic echocardiogram was performed. The patient's chest, abdomen, both groins, and both lower extremities are prepared and draped in a sterile manner. A time out procedure is performed.   PERIPHERAL ACCESS:    Using the modified Seldinger technique, femoral arterial and venous access was obtained with placement of 6 Fr sheaths on the left side.  A pigtail diagnostic catheter was passed through the left arterial sheath under fluoroscopic guidance into the aortic root.  A temporary transvenous pacemaker catheter was passed through the left femoral venous sheath under fluoroscopic guidance into the right ventricle.  The  pacemaker was tested to ensure stable lead placement and pacemaker capture. Aortic root angiography was performed in order to determine the optimal angiographic angle for valve deployment.   TRANSFEMORAL ACCESS:   Percutaneous transfemoral access and sheath placement was performed using ultrasound guidance.  The right common femoral artery was cannulated using a micropuncture needle and appropriate location was verified using hand injection angiogram.  A pair of Abbott Perclose percutaneous closure devices were placed and a 6 French sheath replaced into the femoral artery.  The patient was heparinized systemically and ACT verified > 250 seconds.    A 16 Fr transfemoral E-sheath was introduced into the right femoral artery after progressively dilating over an Amplatz superstiff wire. An AL-2 catheter was used to direct a straight-tip exchange length wire across the native aortic valve into the left ventricle. This was exchanged out for a pigtail catheter and position was confirmed in the LV apex. Simultaneous LV and Ao pressures were recorded.  The pigtail catheter was exchanged for an Amplatz Extra-stiff wire in the LV apex.  Echocardiography was utilized to confirm appropriate wire position and no sign of entanglement in the mitral subvalvular apparatus.   BALLOON AORTIC VALVULOPLASTY:   Not performed   TRANSCATHETER HEART VALVE DEPLOYMENT:   An Edwards Sapien 3 transcatheter heart valve (size 29 mm, model #9600TFX, serial #[id number]) was prepared and crimped per manufacturer's guidelines, and the proper orientation of the valve is confirmed on the [employer] delivery system. The valve was advanced through the introducer sheath using normal technique until in an appropriate position in the abdominal aorta beyond the sheath tip. The balloon was then retracted and using the fine-tuning wheel was centered on the valve. The valve was then advanced across the aortic arch using appropriate flexion  of the catheter. The valve was carefully positioned across  the aortic valve annulus. The Commander catheter was retracted using normal technique. Once final position of the valve has been confirmed by angiographic assessment, the valve is deployed while temporarily holding ventilation and during rapid ventricular pacing to maintain systolic blood pressure < 50 mmHg and pulse pressure < 10 mmHg. The balloon inflation is held for >3 seconds after reaching full deployment volume. Once the balloon has fully deflated the balloon is retracted into the ascending aorta and valve function is assessed using echocardiography. There is felt to be no paravalvular leak and no central aortic insufficiency.  The patient's hemodynamic recovery following valve deployment is good.  The deployment balloon and guidewire are both removed.    PROCEDURE COMPLETION:   The sheath was removed and femoral artery closure performed using the previously placed Perclose devices.  Protamine was administered once femoral arterial repair was complete. The temporary pacemaker, pigtail catheters and femoral sheaths were removed with manual pressure used for hemostasis.   The patient tolerated the procedure well and is transported to the surgical intensive care in stable condition. There were no immediate intraoperative complications. All sponge instrument and needle counts are verified correct at completion of the operation.   No blood products were administered during the operation.  The patient received a total of 132.8 mL of intravenous contrast during the procedure.   Gaye Pollack, MD 09/25/2017 5:41 PM

## 2017-09-26 ENCOUNTER — Encounter (HOSPITAL_COMMUNITY): Payer: Self-pay | Admitting: Cardiovascular Disease

## 2017-09-26 ENCOUNTER — Other Ambulatory Visit: Payer: Self-pay

## 2017-09-26 ENCOUNTER — Inpatient Hospital Stay (HOSPITAL_COMMUNITY): Payer: PPO

## 2017-09-26 DIAGNOSIS — I361 Nonrheumatic tricuspid (valve) insufficiency: Secondary | ICD-10-CM

## 2017-09-26 DIAGNOSIS — I10 Essential (primary) hypertension: Secondary | ICD-10-CM

## 2017-09-26 DIAGNOSIS — I350 Nonrheumatic aortic (valve) stenosis: Secondary | ICD-10-CM

## 2017-09-26 DIAGNOSIS — Z952 Presence of prosthetic heart valve: Secondary | ICD-10-CM

## 2017-09-26 DIAGNOSIS — Z953 Presence of xenogenic heart valve: Secondary | ICD-10-CM

## 2017-09-26 LAB — CBC
HCT: 35.3 % — ABNORMAL LOW (ref 39.0–52.0)
Hemoglobin: 11.8 g/dL — ABNORMAL LOW (ref 13.0–17.0)
MCH: 35 pg — ABNORMAL HIGH (ref 26.0–34.0)
MCHC: 33.4 g/dL (ref 30.0–36.0)
MCV: 104.7 fL — ABNORMAL HIGH (ref 78.0–100.0)
Platelets: 219 10*3/uL (ref 150–400)
RBC: 3.37 MIL/uL — ABNORMAL LOW (ref 4.22–5.81)
RDW: 13.9 % (ref 11.5–15.5)
WBC: 5.1 10*3/uL (ref 4.0–10.5)

## 2017-09-26 LAB — ECHOCARDIOGRAM LIMITED: Weight: 3763.69 oz

## 2017-09-26 LAB — BASIC METABOLIC PANEL
Anion gap: 8 (ref 5–15)
BUN: 11 mg/dL (ref 6–20)
CO2: 26 mmol/L (ref 22–32)
Calcium: 8.5 mg/dL — ABNORMAL LOW (ref 8.9–10.3)
Chloride: 103 mmol/L (ref 101–111)
Creatinine, Ser: 0.81 mg/dL (ref 0.61–1.24)
GFR calc Af Amer: >60 mL/min (ref >60)
GFR calc non Af Amer: >60 mL/min (ref >60)
Glucose, Bld: 92 mg/dL (ref 65–99)
Potassium: 4 mmol/L (ref 3.5–5.1)
Sodium: 137 mmol/L (ref 135–145)

## 2017-09-26 LAB — MAGNESIUM: Magnesium: 1.9 mg/dL (ref 1.7–2.4)

## 2017-09-26 NOTE — Care Management Note (Signed)
Case Management Note Marvetta Gibbons RN, BSN Unit 4E-Case Manager-- [place] coverage [phone]  Patient Details  Name: Barry Decker MRN: [id number] Date of Birth: 1932-06-29  Subjective/Objective:   Pt admitted s/p TAVR                 Action/Plan: PTA pt lived at home with spouse- anticipate return home- CM to follow for transition of care needs.   Expected Discharge Date:                  Expected Discharge Plan:  Home/Self Care  In-House Referral:     Discharge planning Services  CM Consult  Post Acute Care Choice:    Choice offered to:     DME Arranged:    DME Agency:     HH Arranged:    HH Agency:     Status of Service:  In process, will continue to follow  If discussed at Long Length of Stay Meetings, dates discussed:    Discharge Disposition:   Additional Comments:  Dawayne Patricia, RN 09/26/2017, 11:28 AM

## 2017-09-26 NOTE — Progress Notes (Signed)
Progress Note  Patient Name: Barry Decker Date of Encounter: 09/26/2017  Primary Cardiologist: Lauree Chandler, MD   Subjective   No chest pain or dyspnea.   Inpatient Medications    Scheduled Meds: . acetaminophen  1,000 mg Oral Q6H   Or  . acetaminophen (TYLENOL) oral liquid 160 mg/5 mL  1,000 mg Per Tube Q6H  . allopurinol  300 mg Oral Daily  . amLODipine  10 mg Oral Daily  . aspirin EC  81 mg Oral Daily  . clopidogrel  75 mg Oral Daily  . furosemide  20 mg Oral Daily  . mouth rinse  15 mL Mouth Rinse BID  . [START ON 09/27/2017] pantoprazole  40 mg Oral Daily  . potassium chloride  20 mEq Oral Daily  . quinapril  40 mg Oral Daily   Continuous Infusions: . albumin human    .  ceFAZolin (ANCEF) IV 2 g (09/26/17 0644)  . famotidine (PEPCID) IV    . lactated ringers    . lactated ringers 10 mL/hr at 09/25/17 1500   PRN Meds: albumin human, lactated ringers, metoprolol tartrate, midazolam, morphine injection, ondansetron (ZOFRAN) IV, oxyCODONE, traMADol   Vital Signs    Vitals:   09/26/17 0000 09/26/17 0400 09/26/17 0407 09/26/17 0420  BP:    (!) 161/77  Pulse:    (!) 58  Resp:    16  Temp: 97.8 F (36.6 C) 98.1 F (36.7 C)  98.2 F (36.8 C)  TempSrc: Oral Oral  Oral  SpO2:    99%  Weight:   235 lb 3.7 oz (106.7 kg)     Intake/Output Summary (Last 24 hours) at 09/26/2017 0653 Last data filed at 09/26/2017 0644 Gross per 24 hour  Intake 1570 ml  Output 1045 ml  Net 525 ml   Filed Weights   09/26/17 0407  Weight: 235 lb 3.7 oz (106.7 kg)    Telemetry    Sinus brady - Personally Reviewed  ECG    No am ekg - Personally Reviewed  Physical Exam   GEN: No acute distress.   Neck: No JVD Cardiac: RRR, no murmurs, rubs, or gallops.  Respiratory: Clear to auscultation bilaterally. GI: Soft, nontender, non-distended  Ext: No LE edema. Bilateral groins without hematoma Neuro:  Nonfocal  Psych: Normal affect   Labs    Chemistry Recent  Labs  Lab 09/21/17 0916 09/25/17 1601 09/25/17 1718 09/25/17 1757 09/26/17 0412  NA 135 140 139 139 137  K 4.2 3.9 4.2 4.2 4.0  CL 105 102 103  --  103  CO2 21*  --   --   --  26  GLUCOSE 111* 104* 112* 109* 92  BUN 13 12 11   --  11  CREATININE 0.99 0.80 0.70  --  0.81  CALCIUM 9.1  --   --   --  8.5*  PROT 6.5  --   --   --   --   ALBUMIN 3.4*  --   --   --   --   AST 22  --   --   --   --   ALT 18  --   --   --   --   ALKPHOS 69  --   --   --   --   BILITOT 0.8  --   --   --   --   GFRNONAA >60  --   --   --  >60  GFRAA >60  --   --   --  >  60  ANIONGAP 9  --   --   --  8     Hematology Recent Labs  Lab 09/21/17 0916  09/25/17 1750 09/25/17 1757 09/26/17 0412  WBC 7.3  --  7.6  --  5.1  RBC 3.93*  --  3.48*  --  3.37*  HGB 13.7   < > 11.9* 11.6* 11.8*  HCT 40.8   < > 35.9* 34.0* 35.3*  MCV 103.8*  --  103.2*  --  104.7*  MCH 34.9*  --  34.2*  --  35.0*  MCHC 33.6  --  33.1  --  33.4  RDW 13.7  --  13.4  --  13.9  PLT 254  --  184  --  219   < > = values in this interval not displayed.    Cardiac EnzymesNo results for input(s): TROPONINI in the last 168 hours. No results for input(s): TROPIPOC in the last 168 hours.   BNP Recent Labs  Lab 09/21/17 0917  BNP 67.9     DDimer No results for input(s): DDIMER in the last 168 hours.   Radiology    Dg Chest Port 1 View  Result Date: 09/25/2017 CLINICAL DATA:  Status post TAVR EXAM: PORTABLE CHEST 1 VIEW COMPARISON:  09/21/2017 FINDINGS: Right-sided jugular venous catheter tip overlies the SVC. No pneumothorax. Interval aortic valve replacement. Cardiomegaly with minimal atelectasis at the left base. No pneumothorax. IMPRESSION: 1. Interim placement of right IJ central venous catheter with tip over the SVC. No pneumothorax. Interval aortic valve replacement 2. Cardiomegaly with minimal atelectasis at the left base. Electronically Signed   By: Donavan Foil M.D.   On: 09/25/2017 18:17    Cardiac Studies     Patient Profile     84 y.o. male with history of HTN, HLD, severe AS who is s/p TAVR on 09/25/17.   Assessment & Plan    1. Severe aortic stenosis: He is one day post TAVR with placement of a 29 mm Edwards Sapien 3 valve from the right transfemoral approach on 09/25/17. He is doing well this am. Will plan echo today to assess valve. Will continue ASA and Plavix. Discontinue central line and arterial line. Ambulate. Transfer to telemetry unit.   2. HTN: Resume home meds  For questions or updates, please contact [place] Please consult [email or web address] for contact info under Cardiology/STEMI.      Signed, Lauree Chandler, MD  09/26/2017, 6:53 AM

## 2017-09-26 NOTE — Progress Notes (Signed)
  Echocardiogram 2D Echocardiogram Limited S/P TAVR has been performed.  Barry Decker M 09/26/2017, 8:37 AM

## 2017-09-26 NOTE — Plan of Care (Signed)
  Problem: Clinical Measurements: Goal: Cardiovascular complication will be avoided Outcome: Progressing   

## 2017-09-26 NOTE — Progress Notes (Signed)
1450 Came to walk with pt. Getting ready to get Aline out. Will follow up tomorrow . Graylon Good RN BSN 09/26/2017 2:50 PM

## 2017-09-27 LAB — CBC
HCT: 34 % — ABNORMAL LOW (ref 39.0–52.0)
Hemoglobin: 11.5 g/dL — ABNORMAL LOW (ref 13.0–17.0)
MCH: 35.3 pg — ABNORMAL HIGH (ref 26.0–34.0)
MCHC: 33.8 g/dL (ref 30.0–36.0)
MCV: 104.3 fL — ABNORMAL HIGH (ref 78.0–100.0)
Platelets: 183 10*3/uL (ref 150–400)
RBC: 3.26 MIL/uL — ABNORMAL LOW (ref 4.22–5.81)
RDW: 13.6 % (ref 11.5–15.5)
WBC: 6.9 10*3/uL (ref 4.0–10.5)

## 2017-09-27 LAB — BASIC METABOLIC PANEL
Anion gap: 7 (ref 5–15)
BUN: 10 mg/dL (ref 6–20)
CO2: 27 mmol/L (ref 22–32)
Calcium: 8.7 mg/dL — ABNORMAL LOW (ref 8.9–10.3)
Chloride: 104 mmol/L (ref 101–111)
Creatinine, Ser: 0.92 mg/dL (ref 0.61–1.24)
GFR calc Af Amer: >60 mL/min (ref >60)
GFR calc non Af Amer: >60 mL/min (ref >60)
Glucose, Bld: 111 mg/dL — ABNORMAL HIGH (ref 65–99)
Potassium: 4 mmol/L (ref 3.5–5.1)
Sodium: 138 mmol/L (ref 135–145)

## 2017-09-27 MED ORDER — ASPIRIN 81 MG PO TBEC: 81.0000 mg | DELAYED_RELEASE_TABLET | Freq: Every day | ORAL | Status: DC

## 2017-09-27 MED ORDER — CLOPIDOGREL BISULFATE 75 MG PO TABS: 75.0000 mg | ORAL_TABLET | Freq: Every day | ORAL | 0 refills | Status: DC

## 2017-09-27 MED FILL — Heparin Sodium (Porcine) Inj 1000 Unit/ML: INTRAMUSCULAR | Qty: 30 | Status: AC

## 2017-09-27 MED FILL — Magnesium Sulfate Inj 50%: INTRAMUSCULAR | Qty: 10 | Status: AC

## 2017-09-27 MED FILL — Potassium Chloride Inj 2 mEq/ML: INTRAVENOUS | Qty: 40 | Status: AC

## 2017-09-27 NOTE — Progress Notes (Signed)
Progress Note  Patient Name: Barry Decker Date of Encounter: 09/27/2017  Primary Cardiologist: Lauree Chandler, MD   Subjective   No chest pain or dyspnea. He has been ambulating.   Inpatient Medications    Scheduled Meds: . acetaminophen  1,000 mg Oral Q6H   Or  . acetaminophen (TYLENOL) oral liquid 160 mg/5 mL  1,000 mg Per Tube Q6H  . allopurinol  300 mg Oral Daily  . amLODipine  10 mg Oral Daily  . aspirin EC  81 mg Oral Daily  . clopidogrel  75 mg Oral Daily  . furosemide  20 mg Oral Daily  . mouth rinse  15 mL Mouth Rinse BID  . pantoprazole  40 mg Oral Daily  . potassium chloride  20 mEq Oral Daily  . quinapril  40 mg Oral Daily   Continuous Infusions: .  ceFAZolin (ANCEF) IV Stopped (09/27/17 0514)  . lactated ringers Stopped (09/27/17 0444)   PRN Meds: metoprolol tartrate, midazolam, morphine injection, ondansetron (ZOFRAN) IV, oxyCODONE, traMADol   Vital Signs    Vitals:   09/26/17 2315 09/27/17 0001 09/27/17 0310 09/27/17 0444  BP:  122/66 (!) 147/80   Pulse: 70 70 77   Resp: 16 20 14    Temp:  98.4 F (36.9 C) 98.2 F (36.8 C)   TempSrc:  Oral Oral   SpO2: 95% 94% 95%   Weight:    235 lb 0.2 oz (106.6 kg)  Height:  6' (1.829 m)      Intake/Output Summary (Last 24 hours) at 09/27/2017 0659 Last data filed at 09/27/2017 0444 Gross per 24 hour  Intake 776 ml  Output 675 ml  Net 101 ml   Filed Weights   09/26/17 0407 09/27/17 0444  Weight: 235 lb 3.7 oz (106.7 kg) 235 lb 0.2 oz (106.6 kg)    Telemetry    sinus - Personally Reviewed  ECG    No am ekg - Personally Reviewed  Physical Exam   General: Well developed, well nourished, NAD  HEENT: OP clear, mucus membranes moist  SKIN: warm, dry. No rashes. Neuro: No focal deficits  Musculoskeletal: Muscle strength 5/5 all ext  Psychiatric: Mood and affect normal  Neck: No JVD, no carotid bruits, no thyromegaly, no lymphadenopathy.  Lungs:Clear bilaterally, no wheezes, rhonci,  crackles Cardiovascular: Regular rate and rhythm. Systolic murmur noted.  Abdomen:Soft. Bowel sounds present. Non-tender.  Extremities: No lower extremity edema. Pulses are 2 + in the bilateral DP/PT.   Labs    Chemistry Recent Labs  Lab 09/21/17 [phone]  09/25/17 1718 09/25/17 1757 09/26/17 0412 09/27/17 0321  NA 135   < > 139 139 137 138  K 4.2   < > 4.2 4.2 4.0 4.0  CL 105   < > 103  --  103 104  CO2 21*  --   --   --  26 27  GLUCOSE 111*   < > 112* 109* 92 111*  BUN 13   < > 11  --  11 10  CREATININE 0.99   < > 0.70  --  0.81 0.92  CALCIUM 9.1  --   --   --  8.5* 8.7*  PROT 6.5  --   --   --   --   --   ALBUMIN 3.4*  --   --   --   --   --   AST 22  --   --   --   --   --  ALT 18  --   --   --   --   --   ALKPHOS 69  --   --   --   --   --   BILITOT 0.8  --   --   --   --   --   GFRNONAA >60  --   --   --  >60 >60  GFRAA >60  --   --   --  >60 >60  ANIONGAP 9  --   --   --  8 7   < > = values in this interval not displayed.     Hematology Recent Labs  Lab 09/25/17 1750 09/25/17 1757 09/26/17 0412 09/27/17 0321  WBC 7.6  --  5.1 6.9  RBC 3.48*  --  3.37* 3.26*  HGB 11.9* 11.6* 11.8* 11.5*  HCT 35.9* 34.0* 35.3* 34.0*  MCV 103.2*  --  104.7* 104.3*  MCH 34.2*  --  35.0* 35.3*  MCHC 33.1  --  33.4 33.8  RDW 13.4  --  13.9 13.6  PLT 184  --  219 183    Cardiac EnzymesNo results for input(s): TROPONINI in the last 168 hours. No results for input(s): TROPIPOC in the last 168 hours.   BNP Recent Labs  Lab 09/21/17 0917  BNP 67.9     DDimer No results for input(s): DDIMER in the last 168 hours.   Radiology    Dg Chest Port 1 View  Result Date: 09/25/2017 CLINICAL DATA:  Status post TAVR EXAM: PORTABLE CHEST 1 VIEW COMPARISON:  09/21/2017 FINDINGS: Right-sided jugular venous catheter tip overlies the SVC. No pneumothorax. Interval aortic valve replacement. Cardiomegaly with minimal atelectasis at the left base. No pneumothorax. IMPRESSION: 1. Interim  placement of right IJ central venous catheter with tip over the SVC. No pneumothorax. Interval aortic valve replacement 2. Cardiomegaly with minimal atelectasis at the left base. Electronically Signed   By: Donavan Foil M.D.   On: 09/25/2017 18:17    Cardiac Studies    Patient Profile     84 y.o. male with history of HTN, HLD, severe AS who is s/p TAVR on 09/25/17.   Assessment & Plan    1. Severe aortic stenosis: He is now two days post TAVR with placement of a 29 mm Edwards Sapien 3 valve from the right transfemoral approach on 09/25/17.  He is doing well today and ambulating. Echo yesterday showed normally functioning bioprosthetic AVR with mild to moderate paravalvular AI. Will continue ASA and Plavix.   Will d/c home today. Follow up one week with office APP and one month with me in the valve clinic with echo.   For questions or updates, please contact [place] Please consult [email or web address] for contact info under Cardiology/STEMI.      Signed, Lauree Chandler, MD  09/27/2017, 6:59 AM

## 2017-09-27 NOTE — Progress Notes (Signed)
0511-0211 Pt stated he has walked with staff this morning without walker and tolerated well so did not walk with pt. Gave pt ex ed for walking at home and discussed CRP 2. Referred to [place] program. Gave heart healthy diet for information and encouraged watching sodium. Pt and wife voiced understanding. Graylon Good RN BSN 09/27/2017 9:27 AM

## 2017-09-27 NOTE — Discharge Summary (Signed)
Discharge Summary    Patient ID: Barry Decker,  MRN: [id number], DOB/AGE: October 12, 1932 84 y.o.  Admit date: 09/25/2017 Discharge date: 09/27/2017  Primary Care Provider: Marin Decker Primary Cardiologist: Dr. 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 Severe aortic stenosis ? Normal left ventricular systolic function  Post-operative Echo Findings: ? No paravalvular leak ? Normal left ventricular systolic function  TTE: 0/73/71  Study Conclusions  - Left ventricle: The cavity size was normal. Systolic function was   normal. The estimated ejection fraction was in the range of 60%   to 65%. Wall motion was normal; there were no regional wall   motion abnormalities. Doppler parameters are consistent with   abnormal left ventricular relaxation (grade 1 diastolic   dysfunction). Doppler parameters are consistent with elevated   ventricular end-diastolic filling pressure. - Aortic valve: S/P TAVR with a 29 mm Edwards-SAPIEN 3 valve. There   was mild to moderate regurgitation. Mean gradient (S): 13 mm Hg.   Peak gradient (S): 26 mm Hg. Valve area (VTI): 2.59 cm^2. Valve   area (Vmax): 2.62 cm^2. Valve area (Vmean): 2.8 cm^2. - Ascending aorta: The ascending aorta was mildly dilated measuring   44 mm. - Mitral valve: Valve area by continuity equation (using LVOT   flow): 17.58  cm^2. - Right ventricle: Systolic function was normal. - Tricuspid valve: There was mild regurgitation.  Impressions:  - Day 1 post TAVR with a 29 mm Edwards-SAPIEN 3 valve. Normal   transaortic gradients with peak/mean gradient 26/13 mmHg. AVA   2.59 mm2. Mild to moderate paravalvular leak, no central   regurgitation. _____________   History of Present Illness     84 year old gentleman has stage D, moderate, symptomatic aortic stenosis with New York Heart Association class II symptoms of exertional fatigue and shortness of breath as well as orthopnea and lower extremity edema that have developed over the past several months consistent with chronic diastolic congestive heart failure. 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 On echo, the aortic valve is trileaflet with moderate calcification and and thickening of the leaflets with mildly restricted mobility. Although the mean gradient was measured up to 38 mmHg the valve area was calculated at 1.8 cm. There is mild to moderate aortic insufficiency with a pressure half-time of 470 ms. Cardiac catheterization showed large coronary arteries with no significant disease. The mean gradient  measured at catheterization was 32 mmHg. His left ventricular systolic function is normal. His symptoms appear to be due to aortic stenosis although it is still technically in the moderate range. Aortic valve replacement was felt to be the best treatment forhimalthough that decision was complicated by the fact that he has enlargement of his aortic root and ascending aorta.His gated cardiac CTA showed anatomy suitable for transcatheter aortic valve replacement with no complicated features. The abdominal and pelvic CTA shows adequate pelvic arterial anatomy for transfemoral insertion.Studies were reviewedwith him and his wife and the  complexity of the situation.He was agreeable to proceed with TAVR instead of open surgical repair.  Hospital Course     Underwent successful TAVR with Edwards Sapien 3 THV (size 23mm) with Dr. Angelena Form and Dr. Cyndia Bent via transfemoral approach. Felt well the following morning, and post op labs remained stable. Central/arterial lines were removed. Follow up echo stable valve with mild to moderate regurgitation. Mild to moderate paravalvular leak. Mean gradient (S): 13 mm Hg. Peak gradient (S): 26 mm Hg. Valve area (VTI): 2.59 cm^2. Valve area (Vmax): 2.62 cm^2. Valve area (Vmean): 2.8 cm^2. Plan for ASA/Plavix. Worked well with cardiac rehab.    [name] was seen by Dr. Angelena Form and determined stable for discharge home. Follow up in the office has been arranged. Medications are listed below.   _____________  Discharge Vitals Blood pressure (!) 147/71, pulse 64, temperature 98.6 F (37 C), temperature source Oral, resp. rate 17, height 6' (1.829 m), weight 235 lb 0.2 oz (106.6 kg), SpO2 94 %.  Filed Weights   09/26/17 0407 09/27/17 0444  Weight: 235 lb 3.7 oz (106.7 kg) 235 lb 0.2 oz (106.6 kg)    Labs & Radiologic Studies    CBC Recent Labs    09/26/17 0412 09/27/17 0321  WBC 5.1 6.9  HGB 11.8* 11.5*  HCT 35.3* 34.0*  MCV 104.7* 104.3*  PLT 219 409   Basic Metabolic Panel Recent Labs    09/26/17 0412 09/27/17 0321  NA 137 138  K 4.0 4.0  CL 103 104  CO2 26 27  GLUCOSE 92 111*  BUN 11 10  CREATININE 0.81 0.92  CALCIUM 8.5* 8.7*  MG 1.9  --    Liver Function Tests No results for input(s): AST, ALT, ALKPHOS, BILITOT, PROT, ALBUMIN in the last 72 hours. No results for input(s): LIPASE, AMYLASE in the last 72 hours. Cardiac Enzymes No results for input(s): CKTOTAL, CKMB, CKMBINDEX, TROPONINI in the last 72 hours. BNP Invalid input(s): POCBNP D-Dimer No results for input(s): DDIMER in the last 72 hours. Hemoglobin A1C No results for input(s): HGBA1C in the  last 72 hours. Fasting Lipid Panel No results for input(s): CHOL, HDL, LDLCALC, TRIG, CHOLHDL, LDLDIRECT in the last 72 hours. Thyroid Function Tests No results for input(s): TSH, T4TOTAL, T3FREE, THYROIDAB in the last 72 hours.  Invalid input(s): FREET3 _____________  Dg Chest 2 View  Result Date: 09/21/2017 CLINICAL DATA:  Severe aortic stenosis EXAM: CHEST - 2 VIEW COMPARISON:  CT chest 07/23/2017 FINDINGS: The heart size and mediastinal contours are within normal limits. Both lungs are clear. The visualized skeletal structures are unremarkable. IMPRESSION: No active cardiopulmonary disease. Electronically Signed   By: Franchot Gallo M.D.   On: 09/21/2017 14:13   Ct Coronary Morph W/cta Cor W/score W/ca W/cm &/or Wo/cm  Addendum Date: 09/05/2017   ADDENDUM REPORT: 09/05/2017 09:37 CLINICAL DATA:  84 year old male with severe aortic stenosis being evaluated for a TAVR procedure. EXAM:  Cardiac TAVR CT TECHNIQUE: The patient was scanned on a [employer]. A 120 kV retrospective scan was triggered in the descending thoracic aorta at 111 HU's. Gantry rotation speed was 250 msecs and collimation was .6 mm. No beta blockade or nitro were given. The 3D data set was reconstructed in 5% intervals of the R-R cycle. Systolic and diastolic phases were analyzed on a dedicated work station using MPR, MIP and VRT modes. The patient received 80 cc of contrast. FINDINGS: Aortic Valve: Trileaflet aortic valve with moderately thickened and calcified leaflets and severely restricted leaflet opening. No calcifications are extending into the LVOT. Aorta: There is moderate ascending aortic aneurysm and also dilated aortic arch and descending thoracic aorta. Mild calcifications, no dissection. Sinotubular Junction: 41 x 40 mm Ascending Thoracic Aorta: 47 x 45 mm Aortic Arch: 37 x 36 mm Descending Thoracic Aorta: 36 x 34 mm Sinus of Valsalva Measurements: Non-coronary: 46 mm Right -coronary: 44 mm Left -coronary: 45  mm Sinus of Valsalva Height: Right -coronary: 22 mm Left -coronary: 21 mm Coronary Artery Height above Annulus: Left Main: 14 mm Right Coronary: 14 mm Virtual Basal Annulus Measurements: Maximum/Minimum Diameter: 32.7 x 25 mm Mean Diameter: 28.6 mm Perimeter: 92.4 mm Area: 644 mm2 Optimum Fluoroscopic Angle for Delivery: LAO 13 CAU 6 IMPRESSION: 1. Trileaflet aortic valve with moderately thickened and calcified leaflets and severely restricted leaflet opening. Annular measurements suitable for delivery of a 29 mm Edwards SAPIEN 3 valve or a 34 mm Medtronic Evolut R TAVR valve. 2. Sufficient coronary to annulus distance. 3. Optimum Fluoroscopic Angle for Delivery:  LAO 13 CAU 6 4. No thrombus in the left atrial appendage. 5. There is moderately dilated ascending aortic aneurysm as well as mildly dilated aortic arch and descending thoracic aorta. 6. Dilated pulmonary artery measuring 36 mm suggestive of pulmonary hypertension. Electronically Signed   By: Ena Dawley   On: 09/05/2017 09:37   Result Date: 09/05/2017 EXAM: OVER-READ INTERPRETATION  CT CHEST The following report is an over-read performed by radiologist Dr. Vinnie Langton of [facility], [place] on 09/04/2017. This over-read does not include interpretation of cardiac or coronary anatomy or pathology. The coronary calcium score/coronary CTA interpretation by the cardiologist is attached. COMPARISON:  None. FINDINGS: Aortic atherosclerosis with aneurysmal dilatation of the ascending thoracic aorta which measures up to 4.7 cm. Calcified granuloma in the posterior aspect of the left upper lobe incidentally noted. Within the visualized portions of the thorax there are no suspicious appearing pulmonary nodules or masses, there is no acute consolidative airspace disease, no pleural effusions, no pneumothorax and no lymphadenopathy. Visualized portions of the upper abdomen are unremarkable. There are no aggressive appearing lytic or blastic lesions  noted in the visualized portions of the skeleton. IMPRESSION: 1. Aortic atherosclerosis with aneurysmal dilatation of ascending thoracic aorta which measures up to 4.7 cm in diameter. Ascending thoracic aortic aneurysm. Recommend semi-annual imaging followup by CTA or MRA and referral to cardiothoracic surgery if not already obtained. This recommendation follows 2010 ACCF/AHA/AATS/ACR/ASA/SCA/SCAI/SIR/STS/SVM Guidelines for the Diagnosis and Management of Patients With Thoracic Aortic Disease. Circulation. 2010; 121: Z610-R604. Aortic Atherosclerosis (ICD10-I70.0). Aortic aneurysm NOS (ICD10-I71.9). Electronically Signed: By: Vinnie Langton M.D. On: 09/04/2017 08:21   Dg Chest Port 1 View  Result Date: 09/25/2017 CLINICAL DATA:  Status post TAVR EXAM: PORTABLE CHEST 1 VIEW COMPARISON:  09/21/2017 FINDINGS: Right-sided jugular venous catheter tip overlies the SVC. No pneumothorax. Interval aortic valve replacement. Cardiomegaly with minimal atelectasis at the left base. No pneumothorax.  IMPRESSION: 1. Interim placement of right IJ central venous catheter with tip over the SVC. No pneumothorax. Interval aortic valve replacement 2. Cardiomegaly with minimal atelectasis at the left base. Electronically Signed   By: Donavan Foil M.D.   On: 09/25/2017 18:17   Ct Angio Abd/pel W/ And/or W/o  Result Date: 09/04/2017 CLINICAL DATA:  84 year old male with history of severe aortic stenosis. Preprocedural study prior to potential transcatheter aortic valve replacement (TAVR) procedure. EXAM: CT ANGIOGRAPHY ABDOMEN AND PELVIS WITH CONTRAST AND WITHOUT CONTRAST TECHNIQUE: Multidetector CT imaging of the abdomen and pelvis was performed using the standard protocol during bolus administration of intravenous contrast. Multiplanar reconstructed images and MIPs were obtained and reviewed to evaluate the vascular anatomy. CONTRAST:  178mL ISOVUE-370 IOPAMIDOL (ISOVUE-370) INJECTION 76% COMPARISON:  CT the abdomen and pelvis  03/09/2016. FINDINGS: CTA ABDOMEN AND PELVIS FINDINGS Lower thorax: Atherosclerotic calcifications in the left main, left anterior descending and right coronary arteries. Severe thickening calcification of the aortic valve. Hepatobiliary: No suspicious cystic or solid hepatic lesions. No intra or extrahepatic biliary ductal dilatation. Gallbladder is normal in appearance. Pancreas: No pancreatic mass. No pancreatic ductal dilatation. No pancreatic or peripancreatic fluid or inflammatory changes. Spleen: Unremarkable. Adrenals/Urinary Tract: Multiple nonobstructive calculi are noted within the collecting systems of both kidneys, the largest of which measures 8 mm in the interpolar region of the left kidney. Exophytic 2.9 cm low-attenuation lesion in the lower pole of the left kidney, compatible with a simple cyst. Right kidney and bilateral adrenal glands are normal in appearance. No hydroureteronephrosis. Urinary bladder is normal in appearance. Stomach/Bowel: The appearance of the stomach is normal. No pathologic dilatation of small bowel or colon. Status post right hemicolectomy. Vascular/Lymphatic: Aortic atherosclerosis, with vascular findings and measurements pertinent to potential TAVR procedure, as detailed below. No aneurysm or dissection noted in the abdominal or pelvic vasculature. Celiac axis, superior mesenteric artery and inferior mesenteric artery are all widely patent without hemodynamically significant stenosis. Single right and 2 left renal arteries are all widely patent without hemodynamically significant stenosis. No lymphadenopathy noted in the abdomen or pelvis. Reproductive: Prostate gland and seminal vesicles are unremarkable in appearance. Other: No significant volume of ascites.  No pneumoperitoneum. Musculoskeletal: There are no aggressive appearing lytic or blastic lesions noted in the visualized portions of the skeleton. VASCULAR MEASUREMENTS PERTINENT TO TAVR: AORTA: Minimal Aortic  Diameter-24 x 24 mm Severity of Aortic Calcification-moderate RIGHT PELVIS: Right Common Iliac Artery - Minimal Diameter-13.0 x 12.2 mm Tortuosity-mild Calcification-moderate Right External Iliac Artery - Minimal Diameter-11.6 x 10.1 mm Tortuosity-moderate Calcification-mild Right Common Femoral Artery - Minimal Diameter-11.5 x 10.8 mm Tortuosity-mild Calcification-mild LEFT PELVIS: Left Common Iliac Artery - Minimal Diameter-11.2 x 13.1 mm Tortuosity-mild Calcification-moderate Left External Iliac Artery - Minimal Diameter-11.0 x 10.8 mm Tortuosity-moderate Calcification-mild Left Common Femoral Artery - Minimal Diameter-10.3 x 11.2 mm Tortuosity-mild Calcification-mild Review of the MIP images confirms the above findings. IMPRESSION: 1. Vascular findings and measurements pertinent to potential TAVR procedure, as detailed above. 2. Severe thickening calcification of the aortic valve, compatible with the reported clinical history of severe aortic stenosis. 3. Multiple nonobstructive calculi in the left renal collecting system measuring up to 8 mm in the interpolar region. 4. Additional incidental findings, as above. Electronically Signed   By: Vinnie Langton M.D.   On: 09/04/2017 12:57   Disposition   Pt is being discharged home today in good condition.  Follow-up Plans & Appointments    Follow-up Information    [place], [place], [place] Follow  up on 10/04/2017.   Specialty:  Cardiology Why:  at 9am for your follow up appt.  Contact information: [address] [place] [address] [phone]          Discharge Instructions    Amb Referral to Cardiac Rehabilitation   Complete by:  As directed    Diagnosis:  Valve Replacement   Valve:  Aortic Comment - TAVR   Call MD for:  redness, tenderness, or signs of infection (pain, swelling, redness, odor or green/yellow discharge around incision site)   Complete by:  As directed    Diet - low sodium heart healthy   Complete by:  As  directed    Discharge instructions   Complete by:  As directed    ACTIVITY AND EXERCISE . Daily activity and exercise are an important part of your recovery. People recover at different rates depending on their general health and type of valve procedure. . Most people require six to 10 weeks to feel recovered. . No lifting, pushing, pulling more than 10 pounds (examples to avoid: groceries, vacuuming, gardening, golfing):  - For one week with a procedure through the groin.  - For six weeks for procedures through the chest wall.  - For three months for procedures through the breast-bone. NOTE: You will typically see one of our providers 7-10 days after your procedure to discuss [place] the above activities.    DRIVING . Do not drive for four weeks after the date of your procedure. . If you have been told by your doctor in the past that you may not drive, you must talk with him/her before you begin driving again. . When you resume driving, you must have someone with you.   DRESSING . Groin site: you may leave the clear dressing over the site for up to one week or until it falls off.   HYGIENE . If you had a femoral (leg) procedure, you may take a shower when you return home. After the shower, pat the site dry. Do NOT use powder, oils or lotions in your groin area until the site has completely healed. . If you had a chest procedure, you may shower when you return home unless specifically instructed not to by your discharging practitioner.  - DO NOT scrub incision; pat dry with a towel  - DO NOT apply any lotions, oils, powders to the incision  - No tub baths / swimming for at least six weeks. . If you notice any fevers, chills, increased pain, swelling, bleeding or pus, please contact your doctor.  ADDITIONAL INFORMATION . If you are going to have an upcoming dental procedure, please contact our office as you may require antibiotics ahead of time to prevent infection on your  heart valve.   Increase activity slowly   Complete by:  As directed       Discharge Medications     Medication List    STOP taking these medications   aspirin 325 MG tablet Replaced by:  aspirin 81 MG EC tablet     TAKE these medications   allopurinol 300 MG tablet Commonly known as:  ZYLOPRIM Take 1 tablet by mouth once daily What changed:    how much to take  how to take this  when to take this   amLODipine 10 MG tablet Commonly known as:  NORVASC Take 1 tablet by mouth daily What changed:    how much to take  how to take this  when  to take this   aspirin 81 MG EC tablet Take 1 tablet (81 mg total) by mouth daily. Start taking on:  09/28/2017 Replaces:  aspirin 325 MG tablet   clopidogrel 75 MG tablet Commonly known as:  PLAVIX Take 1 tablet (75 mg total) by mouth daily. Start taking on:  09/28/2017   furosemide 20 MG tablet Commonly known as:  LASIX Take 1 tablet (20 mg total) by mouth daily.   IRON PO Take 65 mg/day by mouth daily.   potassium citrate 10 MEQ (1080 MG) SR tablet Commonly known as:  UROCIT-K Take 1 tablet (10 mEq total) by mouth 3 (three) times daily with meals. What changed:  when to take this   quinapril 40 MG tablet Commonly known as:  ACCUPRIL Take 1 tablet by mouth daily What changed:    how much to take  how to take this  when to take this        Outstanding Labs/Studies   Follow up echo in one month.  Duration of Discharge Encounter   Greater than 30 minutes including physician time.  Signed, Reino Bellis NP-C 09/27/2017, 10:30 AM

## 2017-09-27 NOTE — Progress Notes (Signed)
Discharged home accompanied by spouse by wheelchair. Discharged instructions given to pt. Belongings taken home.

## 2017-09-28 ENCOUNTER — Telehealth (HOSPITAL_COMMUNITY): Payer: Self-pay

## 2017-09-28 ENCOUNTER — Ambulatory Visit: Payer: PPO | Admitting: Family Medicine

## 2017-09-28 ENCOUNTER — Telehealth: Payer: Self-pay

## 2017-09-28 NOTE — Consult Note (Signed)
            [facility] CM Primary Care Navigator  09/28/2017  Barry Decker January 25, 1933 [id number]   Attempt toseepatient at the bedside to identify possible discharge needsbuthe was already dischargedhome.  Patient was seen and treated for severe aortic stenosis (status post transcatheter aortic valve replacement (TAVR).  Primary care provider's officeis listed as providingtransition of care (TOC)follow-up.   Patient has discharge instruction to follow-up withcardiology on 10/04/17.   For additional questions please contact:  Edwena Felty A. Evely Gainey, BSN, RN-BC [facility] PRIMARY CARE Navigator Cell: [phone]

## 2017-09-28 NOTE — Telephone Encounter (Signed)
Patient contacted regarding discharge from [facility] on 09/27/2017.  Patient understands to follow up with provider Vin Bhagat PA-C on 10/04/2017 at 9:00 AM at [facility] office. Patient understands discharge instructions? yes Patient understands medications and regiment? yes Patient understands to bring all medications to this visit? yes  The pt is feeling well today and does not have any additional questions or concerns at this time.  I advised the pt to contact the office if needed. The pt is also aware of 11/07/17 apt for Echo and OV with Nell Range PA-C.

## 2017-09-28 NOTE — Telephone Encounter (Signed)
Patients insurance is active and benefits verified through [facility] [facility] - $10.00 co-pay, no deductible, out of pocket amount of $3,100/$385.00 has been met, no co-insurance, and no pre-authorization is required. Reference [id number]  Will contact patient to see if he is interested in the Cardiac Rehab Program. If interested, patient will need to complete follow up appt. Once completed, patient will be contacted for scheduling upon review by the RN Navigator.

## 2017-09-28 NOTE — Telephone Encounter (Signed)
Called patient to see if he is interested in the Cardiac Rehab Program. Patient stated he just got home yesterday and would like to go over the brochure and speak with his Dr. I explained to patient the scheduling process and he stated he understands. Went over insurance and patient verbally stated he understands what he would be responsible for. Will contact patient to follow up once follow up appt has been completed.

## 2017-10-03 NOTE — Progress Notes (Signed)
Cardiology Office Note    Date:  10/04/2017   ID:  Barry Decker, DOB [birth year], MRN [id number]  PCP:  [name], MD  Cardiologist:   Dr. Angelena Form   Chief Complaint: Hospital follow up s/p TVAR History of Present Illness:   Barry Decker is a 84 y.o. male aortic valve disease s/p TVAR, chronic diastolic CHF,HTN, HLD and dialated aortic root and ascending aorta presented for follow up.   He has stage D, moderate, symptomatic aortic stenosis with New York Heart Association class II symptoms of exertional fatigue and shortness of breath as well as orthopnea and lower extremity edema that have developed over the past several months consistent with chronic diastolic congestive heart failure. 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 On echo, the aortic valve is trileaflet with moderate calcification and and thickening of the leaflets with mildly restricted mobility. Although the mean gradient was measured up to 38 mmHg the valve area was calculated at 1.8 cm. There is mild to moderate aortic insufficiency with a pressure half-time of 470 ms. Cardiac catheterization showed large coronary arteries with no significant disease. The mean gradient measured at catheterization was 32 mmHg. His left ventricular systolic function is normal. His symptoms appear to be due to aortic stenosis although it is still technically in the moderate range. Aortic valve replacement was felt to be the best treatment forhimalthough that decision was complicated by the fact that he has enlargement of his aortic root and ascending aorta.  Underwent successful TAVR 09/25/17 with Oletta Lamas Sapien 3 THV (size 67mm) with Dr. Angelena Form and Dr. Cyndia Bent via transfemoral approach. Follow up echo stable valve with mild to moderate regurgitation. Mild to moderate paravalvular he denies Mean gradient (S):  13 mm Hg. Peak gradient (S): 26 mm Hg. Valve area (VTI): 2.59 cm^2. Valve area (Vmax): 2.62 cm^2. Valve area (Vmean): 2.8 cm^2. Plan for ASA/Plavix.   Here today for follow up.  He is doing well post surgery.  He denies chest pain, shortness of breath, palpitation, orthopnea, syncope, lower extremity edema, melena or bloody stool or urine.  Compliant with medication.  He is walking approximately 15 minutes every day without any limitation.   Past Medical History:  Diagnosis Date  . Aortic valve disease   . Arthritis    "spinal" (11/16/2015)  . Colon cancer ([place])    precancer-partial colectomy  . Elevated HDL   . Gout   . Heart murmur   . History of kidney stones   . Hypertension   . Squamous cell skin cancer     Past Surgical History:  Procedure Laterality Date  . BACK SURGERY    . CATARACT EXTRACTION W/ INTRAOCULAR LENS  IMPLANT, BILATERAL Bilateral 2017  . COLONOSCOPY W/ POLYPECTOMY    . FACET JOINT INJECTION  2009  . LITHOTRIPSY  2010  . LUMBAR LAMINECTOMY  ~ 1992  . PARTIAL COLECTOMY  1997   precancerours  . PARTIAL KNEE ARTHROPLASTY Left 11/16/2015   Procedure: LEFT KNEE UNICOMPARTMENTAL ;  Surgeon: [name], MD;  Location: [facility];  Service: Orthopedics;  Laterality: Left;  . REPLACEMENT UNICONDYLAR JOINT KNEE Left 11/16/2015  . RIGHT/LEFT HEART CATH AND CORONARY ANGIOGRAPHY N/A 08/10/2017   Procedure: RIGHT/LEFT HEART CATH AND CORONARY ANGIOGRAPHY;  Surgeon: [name], MD;  Location: [facility];  Service: Cardiovascular;  Laterality: N/A;  . TEE WITHOUT CARDIOVERSION N/A 09/25/2017   Procedure: TRANSESOPHAGEAL ECHOCARDIOGRAM (TEE);  Surgeon: [name], MD;  Location: [facility];  Service: Open Heart Surgery;  Laterality: N/A;  . TONSILLECTOMY    . TRANSCATHETER AORTIC VALVE REPLACEMENT, TRANSFEMORAL N/A 09/25/2017   Procedure: TRANSCATHETER AORTIC VALVE REPLACEMENT, TRANSFEMORAL;  Surgeon: [name], MD;  Location: [facility];   Service: Open Heart Surgery;  Laterality: N/A;  . TYMPANOPLASTY Left 1998   ruptured ear drum with skin graft    Current Medications: Prior to Admission medications   Medication Sig Start Date End Date Taking? Authorizing Provider  allopurinol (ZYLOPRIM) 300 MG tablet Take 1 tablet by mouth once daily Patient taking differently: Take 300 mg by mouth once daily 04/23/17   [name], MD  amLODipine (NORVASC) 10 MG tablet Take 1 tablet by mouth daily Patient taking differently: Take 10 mg by mouth once daily 02/05/17   [name], MD  aspirin EC 81 MG EC tablet Take 1 tablet (81 mg total) by mouth daily. 09/28/17   Cheryln Manly, NP  clopidogrel (PLAVIX) 75 MG tablet Take 1 tablet (75 mg total) by mouth daily. 09/28/17   Cheryln Manly, NP  furosemide (LASIX) 20 MG tablet Take 1 tablet (20 mg total) by mouth daily. 02/12/17   [name], MD  IRON PO Take 65 mg/day by mouth daily.     [provider]  potassium citrate (UROCIT-K) 10 MEQ (1080 MG) SR tablet Take 1 tablet (10 mEq total) by mouth 3 (three) times daily with meals. Patient taking differently: Take 10 mEq by mouth 2 (two) times daily.  07/04/13   Swords, Darrick Penna, MD  quinapril (ACCUPRIL) 40 MG tablet Take 1 tablet by mouth daily Patient taking differently: Take 40 mg by mouth daily 04/23/17   [name], MD    Allergies:   Patient has no known allergies.   Social History   Socioeconomic History  . Marital status: Married    Spouse name: Not on file  . Number of children: 5  . Years of education: Not on file  . Highest education level: Not on file  Occupational History  . Occupation: Government social research officer  Social Needs  . Financial resource strain: Not on file  . Food insecurity:    Worry: Not on file    Inability: Not on file  . Transportation needs:    Medical: Not on file    Non-medical: Not on file  Tobacco Use  . Smoking status: Former Smoker    Packs/day:  1.00    Years: 20.00    Pack years: 20.00    Types: Cigarettes    Last attempt to quit: 09/15/1980    Years since quitting: 37.0  . Smokeless tobacco: Never Used  Substance and Sexual Activity  . Alcohol use: Yes    Alcohol/week: 4.2 oz    Types: 1 Glasses of wine, 1 Shots of liquor, 5 Standard drinks or equivalent per week  . Drug use: No  . Sexual activity: Never  Lifestyle  . Physical activity:    Days per week: Not on file    Minutes per session: Not on file  . Stress: Not on file  Relationships  . Social connections:    Talks on phone: Not on file    Gets together: Not on file    Attends religious service: Not on file    Active member of club or  organization: Not on file    Attends meetings of clubs or organizations: Not on file    Relationship status: Not on file  Other Topics Concern  . Not on file  Social History Narrative   Married. 5 children. 4 grandkids. 1 greatgrandkid.       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       Hobbies: woodworking      Advised to consider advanced directives/hcpoa-thinks wife may have 24 from years ago.     Family History:  The patient's family history includes Heart attack (age of onset: 69) in his brother; Heart attack (age of onset: 48) in his father and mother; Hypertension in his sister.   ROS:   Please see the history of present illness.    ROS All other systems reviewed and are negative.   PHYSICAL EXAM:   VS:  BP 124/62   Pulse 70   Ht 6' (1.829 m)   Wt 237 lb (107.5 kg)   SpO2 94%   BMI 32.14 kg/m    GEN: Well nourished, well developed, in no acute distress  HEENT: normal  Neck: no JVD, carotid bruits, or masses Cardiac: RRR; no murmurs, rubs, or gallops,no edema.  Right groin cath site without hematoma he has ecchymosis on the suprapubic area normal sinus rhythm Respiratory:  clear to auscultation bilaterally, normal work of breathing GI: soft, nontender, nondistended, + BS MS: no  deformity or atrophy  Skin: warm and dry, no rash Neuro:  Alert and Oriented x 3, Strength and sensation are intact Psych: euthymic mood, full affect  Wt Readings from Last 3 Encounters:  10/04/17 237 lb (107.5 kg)  09/27/17 235 lb 0.2 oz (106.6 kg)  09/21/17 239 lb 14.4 oz (108.8 kg)      Studies/Labs Reviewed:   EKG:  EKG is ordered today.  The ekg ordered today demonstrates with first-degree AV block  Recent Labs: 09/21/2017: ALT 18; B Natriuretic Peptide 67.9 09/26/2017: Magnesium 1.9 09/27/2017: BUN 10; Creatinine, Ser 0.92; Hemoglobin 11.5; Platelets 183; Potassium 4.0; Sodium 138   Lipid Panel    Component Value Date/Time   CHOL 208 (H) 03/30/2017 0854   TRIG 119.0 03/30/2017 0854   TRIG 87 06/22/2006 1013   HDL 63.00 03/30/2017 0854   CHOLHDL 3 03/30/2017 0854   VLDL 23.8 03/30/2017 0854   LDLCALC 121 (H) 03/30/2017 0854   LDLDIRECT 136.2 10/25/2012 1009    Additional studies/ records that were reviewed today include:  TAVR: 09/25/17 Transcatheter Aortic Valve Replacement - PercutaneousRightTransfemoral Approach Edwards Sapien 3 THV (size 53mm, model # [id number], serial # [id number])  TTE: 09/26/17  Study Conclusions  - Left ventricle: The cavity size was normal. Systolic function was normal. The estimated ejection fraction was in the range of 60% to 65%. Wall motion was normal; there were no regional wall motion abnormalities. Doppler parameters are consistent with abnormal left ventricular relaxation (grade 1 diastolic dysfunction). Doppler parameters are consistent with elevated ventricular end-diastolic filling pressure. - Aortic valve: S/P TAVR with a 29 mm Edwards-SAPIEN 3 valve. There was mild to moderate regurgitation. Mean gradient (S): 13 mm Hg. Peak gradient (S): 26 mm Hg. Valve area (VTI): 2.59 cm^2. Valve area (Vmax): 2.62 cm^2. Valve area (Vmean): 2.8 cm^2. - Ascending aorta: The ascending aorta was mildly dilated  measuring 44 mm. - Mitral valve: Valve area by continuity equation (using LVOT flow): 17.58 cm^2. - Right ventricle: Systolic function was normal. - Tricuspid valve: There was mild regurgitation.  Impressions:  -  Day 1 post TAVR with a 29 mm Edwards-SAPIEN 3 valve. Normal transaortic gradients with peak/mean gradient 26/13 mmHg. AVA 2.59 mm2. Mild to moderate paravalvular leak, no central regurgitation.  RIGHT/LEFT HEART CATH AND CORONARY ANGIOGRAPHY  08/10/17  Conclusion     Mid RCA lesion is 20% stenosed.  Prox LAD lesion is 20% stenosed.  Ost 1st Diag lesion is 60% stenosed.   1. Mild non-obstructive disease in the LAD, Circumflex and RCA\ 2. Moderate stenosis small caliber Diagonal branch 3. Severe aortic stenosis (peak to peak gradient 33 mmHg, mean gradient 32 mmHg, AVA 1.37 cm2).   Recommendations: Will continue workup for AVR. I think we will likely end up pursuing TAVR but given his dilated aortic root, I want to have Dr. Cyndia Bent see him before we plan further testing.     ASSESSMENT & PLAN:    1. S/p TVAR 09/25/17  - Post op echo showed Mild to moderate paravalvular leak, no central regurgitation.  No dyspnea or orthopnea.  No syncope or dizziness.  Continue aspirin and Plavix.  2. HTN -Blood pressure stable on quinapril and amlodipine.  3. Dialated aortic root and ascending aorta  - Enlargement of the aortic root and ascending aorta with a diameter of 4.8 cm at the sinus of Valsalva and 4.6 cm in the mid a sending aorta by CTA of the chest.  We will continue to follow.  4.  First-degree AV block -No dizziness or syncope.  Not on any AV blocking agent.     Medication Adjustments/Labs and Tests Ordered: Current medicines are reviewed at length with the patient today.  Concerns regarding medicines are outlined above.  Medication changes, Labs and Tests ordered today are listed in the Patient Instructions below. Patient Instructions  Medication  Instructions:  Your physician recommends that you continue on your current medications as directed. Please refer to the Current Medication list given to you today.  Labwork: None  Testing/Procedures: None  Follow-Up: Keep follow up appointment on May 22, at [facility] with Bonney Leitz, PA-C.   Any Other Special Instructions Will Be Listed Below (If Applicable).     If you need a refill on your cardiac medications before your next appointment, please call your pharmacy.      Jarrett Soho, [place]  10/04/2017 9:32 AM    [address], [place], [place]  [address] Phone: [phone]; Fax: [phone]

## 2017-10-04 ENCOUNTER — Encounter: Payer: Self-pay | Admitting: Physician Assistant

## 2017-10-04 ENCOUNTER — Ambulatory Visit: Payer: PPO | Admitting: Physician Assistant

## 2017-10-04 VITALS — BP 124/62 | HR 70 | Ht 72.0 in | Wt 237.0 lb

## 2017-10-04 DIAGNOSIS — Z952 Presence of prosthetic heart valve: Secondary | ICD-10-CM | POA: Diagnosis not present

## 2017-10-04 DIAGNOSIS — I77810 Thoracic aortic ectasia: Secondary | ICD-10-CM | POA: Diagnosis not present

## 2017-10-04 DIAGNOSIS — I5032 Chronic diastolic (congestive) heart failure: Secondary | ICD-10-CM

## 2017-10-04 DIAGNOSIS — I350 Nonrheumatic aortic (valve) stenosis: Secondary | ICD-10-CM

## 2017-10-04 NOTE — Patient Instructions (Addendum)
Medication Instructions:  Your physician recommends that you continue on your current medications as directed. Please refer to the Current Medication list given to you today.  Labwork: None  Testing/Procedures: None  Follow-Up: Keep follow up appointment on May 22, at [facility] with Bonney Leitz, PA-C.   Any Other Special Instructions Will Be Listed Below (If Applicable).     If you need a refill on your cardiac medications before your next appointment, please call your pharmacy.

## 2017-10-05 ENCOUNTER — Telehealth (HOSPITAL_COMMUNITY): Payer: Self-pay

## 2017-10-05 NOTE — Telephone Encounter (Signed)
Called to follow up with patient in regards to Cardiac Rehab - Patient stated he did not talk with his Cardiologist about CR and has not had a moment to look over the brochure. Patient stated he will look over it this weekend and to call on Monday.

## 2017-10-08 ENCOUNTER — Telehealth (HOSPITAL_COMMUNITY): Payer: Self-pay | Admitting: *Deleted

## 2017-10-08 NOTE — Telephone Encounter (Signed)
-----   Message from [name], MD sent at 10/08/2017  7:55 AM EDT ----- Regarding: RE: Cardiac Rehab No restrictions on activity. I would try to keep his BP under 160. Thanks, chris  ----- Message ----- From: Rowe Pavy, RN Sent: 10/05/2017   8:10 AM To: [name], MD Subject: Cardiac Rehab                                  Dr. Angelena Form,  The above patient s/p TAVR referred to cardiac rehab.  Pt completed his first follow up appt on 4/18 with Bghat PA.  He is on track with his recovery and we will reach out to him for scheduling.  Noted in MEDICAL RECORD NUMBERDialated aortic root and ascending aorta  - Enlargement of the aortic root and ascending aorta with a diameter of 4.8 cm at the sinus of Valsalva and 4.6 cm in the mid a sending aorta by CTA of the chest.  We will continue to follow.   Any restrictions for activity or BP parameters for rest and on exertion needed.   Thanks so much for your input! Cherre Huger, BSN Cardiac and Training and development officer

## 2017-10-17 ENCOUNTER — Encounter: Payer: Self-pay | Admitting: Physician Assistant

## 2017-10-19 ENCOUNTER — Telehealth (HOSPITAL_COMMUNITY): Payer: Self-pay

## 2017-10-19 NOTE — Telephone Encounter (Signed)
Attempted to follow up with patient in regards to Cardiac Rehab - lm on vm °

## 2017-10-30 ENCOUNTER — Telehealth: Payer: Self-pay

## 2017-10-30 ENCOUNTER — Telehealth: Payer: Self-pay | Admitting: *Deleted

## 2017-10-30 NOTE — Telephone Encounter (Signed)
Medical Clearance for Dental Services received in office from [facility].  I spoke with pt and he is scheduled for cleaning on June 6th.  He is seeing K. Thompson,PA on 11/07/17 and I told him form could be addressed at that visit.  Pt is aware he will need to take an antibiotic prior to any dental office visit.  He has taken amoxicillin in the past.

## 2017-10-30 NOTE — Telephone Encounter (Signed)
error 

## 2017-10-31 NOTE — Telephone Encounter (Signed)
I spoke with Apolonio Schneiders at [facility] and told her pt had appointment on 5/22 and clearance request would be addressed at that appt.

## 2017-11-07 ENCOUNTER — Encounter: Payer: Self-pay | Admitting: Physician Assistant

## 2017-11-07 ENCOUNTER — Ambulatory Visit: Payer: PPO | Admitting: Physician Assistant

## 2017-11-07 ENCOUNTER — Ambulatory Visit (HOSPITAL_COMMUNITY): Payer: PPO | Attending: Cardiology

## 2017-11-07 ENCOUNTER — Other Ambulatory Visit: Payer: Self-pay

## 2017-11-07 VITALS — BP 154/70 | HR 71 | Ht 72.0 in | Wt 235.8 lb

## 2017-11-07 DIAGNOSIS — I10 Essential (primary) hypertension: Secondary | ICD-10-CM | POA: Diagnosis not present

## 2017-11-07 DIAGNOSIS — I350 Nonrheumatic aortic (valve) stenosis: Secondary | ICD-10-CM

## 2017-11-07 DIAGNOSIS — Z953 Presence of xenogenic heart valve: Secondary | ICD-10-CM | POA: Insufficient documentation

## 2017-11-07 DIAGNOSIS — E785 Hyperlipidemia, unspecified: Secondary | ICD-10-CM | POA: Diagnosis not present

## 2017-11-07 DIAGNOSIS — I509 Heart failure, unspecified: Secondary | ICD-10-CM | POA: Diagnosis not present

## 2017-11-07 DIAGNOSIS — I77810 Thoracic aortic ectasia: Secondary | ICD-10-CM | POA: Diagnosis not present

## 2017-11-07 DIAGNOSIS — I110 Hypertensive heart disease with heart failure: Secondary | ICD-10-CM | POA: Insufficient documentation

## 2017-11-07 DIAGNOSIS — Z952 Presence of prosthetic heart valve: Secondary | ICD-10-CM | POA: Diagnosis not present

## 2017-11-07 DIAGNOSIS — I080 Rheumatic disorders of both mitral and aortic valves: Secondary | ICD-10-CM | POA: Insufficient documentation

## 2017-11-07 NOTE — Progress Notes (Signed)
[facility]                                       Cardiology Office Note    Date:  11/07/2017   ID:  Barry Decker, DOB 07-Dec-1932, MRN [id number]  PCP:  [name], MD  Cardiologist: Dr. Angelena Form  CC: 1 month follow up s/p TAVR   History of Present Illness:  Barry Decker is a 84 y.o. male with a history of HTN, HLD, rheumatic fever in childhood, dialated aortic root and ascending aorta, and severe AS s/p TAVR (09/25/17) who presents to clinic for follow up.  The patient has history of rheumatic fever during childhood and states that he has been told he had a heart murmur most of his life.  He has been followed by Dr. Angelena Form since 2015.  Echocardiograms have documented the presence of normal left ventricular systolic function with moderate aortic stenosis and aortic insufficiency with mildly dilated aortic root.  Over the last several years the patient has developed progressive symptoms of exertional shortness of breath and fatigue.  Recent follow-up transthoracic echocardiogram performed July 12, 2017 revealed normal left ventricular systolic function with ejection fraction estimated 65-70%.  There was severe calcification of the aortic annulus with at least moderate aortic stenosis and regurgitation.  The aortic valve is trileaflet with thickened, moderately calcified leaflets.  Peak velocity across the aortic valve measured as high as 3.8 m/s corresponding to mean transvalvular gradient estimated 33 mmHg.  The DVI was reported 0.31.  There was aortic insufficiency with pressure halftime measured 470 ms.  The patient subsequently underwent diagnostic cardiac catheterization by Dr. Angelena Form on August 10, 2017.  He was found to have mild nonobstructive coronary artery disease.  Peak to peak and mean transvalvular gradients across the aortic valve were measured 33 and 32 mmHg respectively, corresponding to aortic valve area calculated  1.37 cm.   He ultimately underwent successful TAVR 09/25/17 with Oletta Lamas Sapien 3 THV (size 32mm) with Dr. Angelena Form and Dr. Cyndia Bent via transfemoral approach. Follow up echo revealed a stable valve withmild to moderate PVL; Mean gradient (S): 13 mm Hg; Peak gradient (S): 26 mm Hg. He was discharged on ASA and plavix.   Today he presents to clinic for 1 month follow up. He is feeling great. He has much more energy and doing all the things he loves including woodworking. He has started walking and up to a half an hour. He can walk up a flight of stairs with no issues. No CP. No LE edema, orthopnea or PND. No dizziness or syncope. No blood in stool or urine. No palpitations.    Past Medical History:  Diagnosis Date  . Aortic valve disease    s/p TVAR 09/25/17  . Arthritis    "spinal" (11/16/2015)  . Colon cancer ([place])    precancer-partial colectomy  . Dilated aortic root ([place])   . Elevated HDL   . First degree AV block   . Gout   . Heart murmur   . History of kidney stones   . Hypertension   . Squamous cell skin cancer     Past Surgical History:  Procedure Laterality Date  . BACK SURGERY    . CATARACT EXTRACTION W/ INTRAOCULAR LENS  IMPLANT, BILATERAL Bilateral 2017  . COLONOSCOPY W/ POLYPECTOMY    . FACET JOINT INJECTION  2009  . LITHOTRIPSY  2010  . LUMBAR LAMINECTOMY  ~ 1992  . PARTIAL COLECTOMY  1997   precancerours  . PARTIAL KNEE ARTHROPLASTY Left 11/16/2015   Procedure: LEFT KNEE UNICOMPARTMENTAL ;  Surgeon: [name], MD;  Location: [facility];  Service: Orthopedics;  Laterality: Left;  . REPLACEMENT UNICONDYLAR JOINT KNEE Left 11/16/2015  . RIGHT/LEFT HEART CATH AND CORONARY ANGIOGRAPHY N/A 08/10/2017   Procedure: RIGHT/LEFT HEART CATH AND CORONARY ANGIOGRAPHY;  Surgeon: [name], MD;  Location: [facility];  Service: Cardiovascular;  Laterality: N/A;  . TEE WITHOUT CARDIOVERSION N/A 09/25/2017   Procedure: TRANSESOPHAGEAL ECHOCARDIOGRAM (TEE);  Surgeon:  [name], MD;  Location: [facility];  Service: Open Heart Surgery;  Laterality: N/A;  . TONSILLECTOMY    . TRANSCATHETER AORTIC VALVE REPLACEMENT, TRANSFEMORAL N/A 09/25/2017   Procedure: TRANSCATHETER AORTIC VALVE REPLACEMENT, TRANSFEMORAL;  Surgeon: [name], MD;  Location: [facility];  Service: Open Heart Surgery;  Laterality: N/A;  . TYMPANOPLASTY Left 1998   ruptured ear drum with skin graft    Current Medications: Outpatient Medications Prior to Visit  Medication Sig Dispense Refill  . allopurinol (ZYLOPRIM) 300 MG tablet Take 1 tablet by mouth once daily 90 tablet 1  . amLODipine (NORVASC) 10 MG tablet Take 1 tablet by mouth daily 90 tablet 2  . aspirin EC 81 MG EC tablet Take 1 tablet (81 mg total) by mouth daily.    . clopidogrel (PLAVIX) 75 MG tablet Take 1 tablet (75 mg total) by mouth daily. 90 tablet 0  . furosemide (LASIX) 20 MG tablet Take 1 tablet (20 mg total) by mouth daily. 90 tablet 2  . IRON PO Take 65 mg/day by mouth daily.     . potassium citrate (UROCIT-K) 10 MEQ (1080 MG) SR tablet Take 1 tablet (10 mEq total) by mouth 3 (three) times daily with meals. 300 tablet 1  . quinapril (ACCUPRIL) 40 MG tablet Take 1 tablet by mouth daily (Patient taking differently: Take 40 mg by mouth daily) 90 tablet 1   No facility-administered medications prior to visit.      Allergies:   Patient has no known allergies.   Social History   Socioeconomic History  . Marital status: Married    Spouse name: Not on file  . Number of children: 5  . Years of education: Not on file  . Highest education level: Not on file  Occupational History  . Occupation: Government social research officer  Social Needs  . Financial resource strain: Not on file  . Food insecurity:    Worry: Not on file    Inability: Not on file  . Transportation needs:    Medical: Not on file    Non-medical: Not on file  Tobacco Use  . Smoking status: Former Smoker    Packs/day: 1.00    Years: 20.00     Pack years: 20.00    Types: Cigarettes    Last attempt to quit: 09/15/1980    Years since quitting: 37.1  . Smokeless tobacco: Never Used  Substance and Sexual Activity  . Alcohol use: Yes    Alcohol/week: 4.2 oz    Types: 1 Glasses of wine, 1 Shots of liquor, 5 Standard drinks or equivalent per week  . Drug use: No  . Sexual activity: Never  Lifestyle  . Physical activity:    Days per week: Not on file    Minutes per session: Not on file  . Stress: Not on file  Relationships  . Social connections:    Talks on phone: Not on file    Gets together: Not on file    Attends religious service: Not on file    Active member of club or organization: Not on file    Attends meetings of clubs or organizations: Not on file    Relationship status: Not on file  Other Topics Concern  . Not on file  Social History Narrative   Married. 5 children. 4 grandkids. 1 greatgrandkid.       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       Hobbies: woodworking      Advised to consider advanced directives/hcpoa-thinks wife may have 57 from years ago.     Family History:  The patient's family history includes Heart attack (age of onset: 63) in his brother; Heart attack (age of onset: 1) in his father and mother; Hypertension in his sister.      ROS:   Please see the history of present illness.    ROS All other systems reviewed and are negative.   PHYSICAL EXAM:   VS:  BP (!) 154/70   Pulse 71   Ht 6' (1.829 m)   Wt 235 lb 12.8 oz (107 kg)   BMI 31.98 kg/m    GEN: Well nourished, well developed, in no acute distress  HEENT: normal  Neck: no JVD, carotid bruits, or masses Cardiac: RRR; soft flow murmurs. No rubs, or gallops,no edema  Respiratory:  clear to auscultation bilaterally, normal work of breathing GI: soft, nontender, nondistended, + BS MS: no deformity or atrophy  Skin: warm and dry, no rash Neuro:  Alert and Oriented x 3, Strength and sensation are  intact Psych: euthymic mood, full affect   Wt Readings from Last 3 Encounters:  11/07/17 235 lb 12.8 oz (107 kg)  10/04/17 237 lb (107.5 kg)  09/27/17 235 lb 0.2 oz (106.6 kg)      Studies/Labs Reviewed:   EKG:  EKG is NOT ordered today.  Recent Labs: 09/21/2017: ALT 18; B Natriuretic Peptide 67.9 09/26/2017: Magnesium 1.9 09/27/2017: BUN 10; Creatinine, Ser 0.92; Hemoglobin 11.5; Platelets 183; Potassium 4.0; Sodium 138   Lipid Panel    Component Value Date/Time   CHOL 208 (H) 03/30/2017 0854   TRIG 119.0 03/30/2017 0854   TRIG 87 06/22/2006 1013   HDL 63.00 03/30/2017 0854   CHOLHDL 3 03/30/2017 0854   VLDL 23.8 03/30/2017 0854   LDLCALC 121 (H) 03/30/2017 0854   LDLDIRECT 136.2 10/25/2012 1009    Additional studies/ records that were reviewed today include:  TAVR: 09/25/17 Transcatheter Aortic Valve Replacement - PercutaneousRightTransfemoral Approach Edwards Sapien 3 THV (size 44mm, model # [id number], serial # [id number])  Post operative echo: 09/26/17 Study Conclusions - Left ventricle: The cavity size was normal. Systolic function was normal. The estimated ejection fraction was in the range of 60% to 65%. Wall motion was normal; there were no regional wall motion abnormalities. Doppler parameters are consistent with abnormal left ventricular relaxation (grade 1 diastolic dysfunction). Doppler parameters are consistent with elevated ventricular end-diastolic filling pressure. - Aortic valve: S/P TAVR with a 29 mm Edwards-SAPIEN 3 valve. There was mild to moderate regurgitation. Mean gradient (S): 13 mm Hg. Peak gradient (S): 26 mm Hg. Valve area (VTI): 2.59 cm^2. Valve area (Vmax): 2.62 cm^2. Valve area (Vmean): 2.8 cm^2. - Ascending aorta: The ascending aorta was mildly dilated measuring 44 mm. - Mitral valve: Valve area by continuity equation (using LVOT flow): 17.58  cm^2. - Right ventricle: Systolic function was normal. -  Tricuspid valve: There was mild regurgitation. Impressions: - Day 1 post TAVR with a 29 mm Edwards-SAPIEN 3 valve. Normal transaortic gradients with peak/mean gradient 26/13 mmHg. AVA 2.59 mm2. Mild to moderate paravalvular leak, no central regurgitation.  ________________  2D ECHO 11/07/17 (30 day post op) Study Conclusions - Left ventricle: The cavity size was normal. Wall thickness was   increased in a pattern of moderate LVH. Systolic function was   normal. The estimated ejection fraction was in the range of 60%   to 65%. Wall motion was normal; there were no regional wall   motion abnormalities. Doppler parameters are consistent with   abnormal left ventricular relaxation (grade 1 diastolic   dysfunction). - Aortic valve: Bioprosthetic aortic valve s/p TAVR. There is mild   peri-valvular regurgitation. No significant stenosis. Mean   gradient (S): 16 mm Hg. Valve area (VTI): 1.93 cm^2. - Aorta: Dilated aortic root and ascending aorta. Aortic root   dimension: 45 mm (ED). Ascending aortic diameter: 46 mm (S). - Mitral valve: Mildly calcified annulus. Mildly calcified leaflets - Left atrium: The atrium was mildly dilated. - Right ventricle: The cavity size was normal. Systolic function   was normal. - Tricuspid valve: Peak RV-RA gradient (S): 25 mm Hg. - Pulmonary arteries: PA peak pressure: 28 mm Hg (S). - Inferior vena cava: The vessel was normal in size. The   respirophasic diameter changes were in the normal range (>= 50%),   consistent with normal central venous pressure. Impressions: - Normal LV size with moderate LV hypertrophy. EF 60-65%. Normal RV   size and systolic function. Bioprosthetic aortic valve s/p TAVR   with mild peri-valvular regurgitation and no significant   stenosis.  ASSESSMENT & PLAN:   Severe AS s/p TAVR: 2D ECHO today shows EF 60%, normally functioning TAVR valve with mild PVL; mean gradient 16 mmHg. He has NHYA class I symptoms. Continue  ASA and plavix. He can stop plavix after 6 months of therapy. SBE prophylaxis discussed; he has Amoxil. He has a dental extraction planned. I discussed with pharmacy and he is okay to continue on plavix for a single dental extraction. Paperwork filled out. I will see him back in 1 year for follow up and echo.   HTN: BP elevated today. He says it tends to be elevated at Drs office. I have asked him to keep a log and call us back if BP running consistently over 140/90.  Dialated aortic root and ascending aorta: pre TAVR CTs showed enlargement of the aortic root and ascending aorta with a diameter of 4.8 cm at the sinus of Valsalva and 4.6 cm in the mid a sending aorta by CTA of the chest.  We will continue to follow.    Medication Adjustments/Labs and Tests Ordered: Current medicines are reviewed at length with the patient today.  Concerns regarding medicines are outlined above.  Medication changes, Labs and Tests ordered today are listed in the Patient Instructions below. Patient Instructions  Medication Instructions:  1) STOP PLAVIX 03/28/2018 (6 months out from your TAVR)  2) Your physician discussed the importance of taking an antibiotic prior to any dental, gastrointestinal, genitourinary procedures to prevent damage to the heart valves from infection.  Labwork: None  Testing/Procedures: You will be called to schedule your one year TAVR echocardiogram.  Follow-Up: You will be called to schedule your one year visit with Nell Range, PA.  Any Other Special Instructions Will Be Listed  Below (If Applicable). Please check your blood pressure every now and then. If you notice that it is consistently over 140/90, call us.   If you need a refill on your cardiac medications before your next appointment, please call your pharmacy.      Signed, Angelena Form, PA-C  11/07/2017 4:52 PM    [facility] [place], [place], [place]  [address] Phone: [phone]; Fax: [phone]

## 2017-11-07 NOTE — Patient Instructions (Addendum)
Medication Instructions:  1) STOP PLAVIX 03/28/2018 (6 months out from your TAVR)  2) Your physician discussed the importance of taking an antibiotic prior to any dental, gastrointestinal, genitourinary procedures to prevent damage to the heart valves from infection.  Labwork: None  Testing/Procedures: You will be called to schedule your one year TAVR echocardiogram.  Follow-Up: You will be called to schedule your one year visit with Nell Range, PA.  Any Other Special Instructions Will Be Listed Below (If Applicable). Please check your blood pressure every now and then. If you notice that it is consistently over 140/90, call us.   If you need a refill on your cardiac medications before your next appointment, please call your pharmacy.

## 2017-11-08 ENCOUNTER — Other Ambulatory Visit: Payer: Self-pay | Admitting: Physician Assistant

## 2017-11-08 MED ORDER — CLOPIDOGREL BISULFATE 75 MG PO TABS: 75.0000 mg | ORAL_TABLET | Freq: Every day | ORAL | 0 refills | Status: DC

## 2017-11-17 ENCOUNTER — Other Ambulatory Visit: Payer: Self-pay | Admitting: Cardiovascular Disease

## 2017-11-20 ENCOUNTER — Other Ambulatory Visit: Payer: Self-pay | Admitting: Cardiovascular Disease

## 2017-11-20 ENCOUNTER — Encounter (HOSPITAL_COMMUNITY): Payer: Self-pay

## 2017-11-20 ENCOUNTER — Telehealth (HOSPITAL_COMMUNITY): Payer: Self-pay

## 2017-11-20 ENCOUNTER — Other Ambulatory Visit: Payer: Self-pay | Admitting: Family Medicine

## 2017-11-20 NOTE — Telephone Encounter (Signed)
2nd attempt to call patient in regards to Cardiac Rehab - lm on vm. Sending letter. °

## 2017-11-22 ENCOUNTER — Encounter: Payer: Self-pay | Admitting: Thoracic Surgery (Cardiothoracic Vascular Surgery)

## 2017-12-03 DIAGNOSIS — H26492 Other secondary cataract, left eye: Secondary | ICD-10-CM | POA: Diagnosis not present

## 2017-12-05 ENCOUNTER — Telehealth (HOSPITAL_COMMUNITY): Payer: Self-pay

## 2017-12-05 NOTE — Telephone Encounter (Signed)
3rd attempt to contact patient in regards to Cardiac Rehab - lm on vm °

## 2018-01-18 ENCOUNTER — Telehealth: Payer: Self-pay | Admitting: *Deleted

## 2018-01-18 DIAGNOSIS — I77810 Thoracic aortic ectasia: Secondary | ICD-10-CM

## 2018-01-18 DIAGNOSIS — R911 Solitary pulmonary nodule: Secondary | ICD-10-CM

## 2018-01-18 NOTE — Telephone Encounter (Signed)
Pt needs follow up CTA of chest in August.  I placed call to pt to schedule CTA and BMP.  Left message to call back

## 2018-01-23 NOTE — Telephone Encounter (Signed)
I spoke with pt about follow up CTA.  He will come in tomorrow for BMP. I told him CT department would call him to schedule appointment.

## 2018-01-24 ENCOUNTER — Other Ambulatory Visit: Payer: PPO | Admitting: *Deleted

## 2018-01-24 DIAGNOSIS — I77810 Thoracic aortic ectasia: Secondary | ICD-10-CM

## 2018-01-24 DIAGNOSIS — R911 Solitary pulmonary nodule: Secondary | ICD-10-CM

## 2018-01-24 LAB — BASIC METABOLIC PANEL
BUN/Creatinine Ratio: 19 (ref 10–24)
BUN: 19 mg/dL (ref 8–27)
CO2: 24 mmol/L (ref 20–29)
Calcium: 9.4 mg/dL (ref 8.6–10.2)
Chloride: 103 mmol/L (ref 96–106)
Creatinine, Ser: 0.98 mg/dL (ref 0.76–1.27)
GFR calc Af Amer: 82 mL/min/{1.73_m2} (ref >59)
GFR calc non Af Amer: 71 mL/min/{1.73_m2} (ref >59)
Glucose: 100 mg/dL — ABNORMAL HIGH (ref 65–99)
Potassium: 4.7 mmol/L (ref 3.5–5.2)
Sodium: 140 mmol/L (ref 134–144)

## 2018-02-04 ENCOUNTER — Ambulatory Visit (INDEPENDENT_AMBULATORY_CARE_PROVIDER_SITE_OTHER)
Admission: RE | Admit: 2018-02-04 | Discharge: 2018-02-04 | Disposition: A | Discharge status: Home / Self Care | Payer: PPO | Source: Ambulatory Visit | Attending: Cardiovascular Disease | Admitting: Cardiovascular Disease

## 2018-02-04 DIAGNOSIS — I712 Thoracic aortic aneurysm, without rupture: Secondary | ICD-10-CM | POA: Diagnosis not present

## 2018-02-04 DIAGNOSIS — R911 Solitary pulmonary nodule: Secondary | ICD-10-CM | POA: Diagnosis not present

## 2018-02-04 DIAGNOSIS — I77810 Thoracic aortic ectasia: Secondary | ICD-10-CM | POA: Diagnosis not present

## 2018-02-04 MED ORDER — IOPAMIDOL (ISOVUE-370) INJECTION 76%
100.0000 mL | Freq: Once | INTRAVENOUS | Status: AC | PRN
  Administered 2018-02-04: 100 mL via INTRAVENOUS

## 2018-03-11 ENCOUNTER — Encounter: Payer: Self-pay | Admitting: Cardiovascular Disease

## 2018-03-11 ENCOUNTER — Ambulatory Visit (INDEPENDENT_AMBULATORY_CARE_PROVIDER_SITE_OTHER): Payer: PPO | Admitting: Cardiovascular Disease

## 2018-03-11 VITALS — BP 130/68 | HR 75 | Ht 72.0 in | Wt 236.0 lb

## 2018-03-11 DIAGNOSIS — I350 Nonrheumatic aortic (valve) stenosis: Secondary | ICD-10-CM

## 2018-03-11 DIAGNOSIS — I5032 Chronic diastolic (congestive) heart failure: Secondary | ICD-10-CM

## 2018-03-11 DIAGNOSIS — I10 Essential (primary) hypertension: Secondary | ICD-10-CM | POA: Diagnosis not present

## 2018-03-11 DIAGNOSIS — I712 Thoracic aortic aneurysm, without rupture, unspecified: Secondary | ICD-10-CM

## 2018-03-11 DIAGNOSIS — I77810 Thoracic aortic ectasia: Secondary | ICD-10-CM

## 2018-03-11 NOTE — Patient Instructions (Addendum)
Medication Instructions:  Your physician recommends that you continue on your current medications as directed. Please refer to the Current Medication list given to you today.   Labwork: Your physician recommends that you return for lab work in: February  --BMP--prior to CTA   Testing/Procedures: Non-Cardiac CT Angiography (CTA), is a special type of CT scan that uses a computer to produce multi-dimensional views of major blood vessels throughout the body. In CT angiography, a contrast material is injected through an IV to help visualize the blood vessels To be done in February 2020-around February 19th   Follow-Up: We will call you to schedule follow up in April--You will have an echocardiogram and see K. Grandville Silos, PA at that time.   Any Other Special Instructions Will Be Listed Below (If Applicable).     If you need a refill on your cardiac medications before your next appointment, please call your pharmacy.

## 2018-03-11 NOTE — Progress Notes (Signed)
Chief Complaint  Patient presents with  . Follow-up    Aortic valve disease   History of Present Illness: 84 yo male with history of HTN, HLD, dilated aortic root and severe aortic stenosis s/p TAVR April 2019 who is here today for follow up. He had rheumatic fever as a child and was followed for aortic stenosis for many years. I saw him 07/04/17 and he c/o worsened dyspnea. Echo 07/12/17 showed normal LV systolic function with PYPP=50-93% and severe aortic stenosis. His aortic root was enlarged. Chest CTA 07/23/17 with dilated ascending thoracic aorta with proximal to mid ascending thoracic aorta up to 4.6 cm. Cardiac cath February 2019 with mild non-obstructive CAD. He had TAVR 09/25/17 with placement of a 29 mm Edwards Sapien 3 valve from the transfemoral approach. He was discharged on ASA and Plavix. Echo May 2019 with normal LV systolic function and normally functioning bioprosthetic AVR with mild perivalvular AI. Chest CTA 02/04/18 with stable 4.8 cm ascending thoracic aortic aneurysm.   He is here today for follow up. The patient denies any chest pain, dyspnea, palpitations, lower extremity edema, orthopnea, PND, dizziness, near syncope or syncope. He feels great.     Primary Care Physician: [name], MD  Past Medical History:  Diagnosis Date  . Aortic valve disease    s/p TVAR 09/25/17  . Arthritis    "spinal" (11/16/2015)  . Colon cancer ([place])    precancer-partial colectomy  . Dilated aortic root ([place])   . Elevated HDL   . First degree AV block   . Gout   . Heart murmur   . History of kidney stones   . Hypertension   . Squamous cell skin cancer     Past Surgical History:  Procedure Laterality Date  . BACK SURGERY    . CATARACT EXTRACTION W/ INTRAOCULAR LENS  IMPLANT, BILATERAL Bilateral 2017  . COLONOSCOPY W/ POLYPECTOMY    . FACET JOINT INJECTION  2009  . LITHOTRIPSY  2010  . LUMBAR LAMINECTOMY  ~ 1992  . PARTIAL COLECTOMY  1997   precancerours  . PARTIAL KNEE  ARTHROPLASTY Left 11/16/2015   Procedure: LEFT KNEE UNICOMPARTMENTAL ;  Surgeon: [name], MD;  Location: [facility];  Service: Orthopedics;  Laterality: Left;  . REPLACEMENT UNICONDYLAR JOINT KNEE Left 11/16/2015  . RIGHT/LEFT HEART CATH AND CORONARY ANGIOGRAPHY N/A 08/10/2017   Procedure: RIGHT/LEFT HEART CATH AND CORONARY ANGIOGRAPHY;  Surgeon: [name], MD;  Location: [facility];  Service: Cardiovascular;  Laterality: N/A;  . TEE WITHOUT CARDIOVERSION N/A 09/25/2017   Procedure: TRANSESOPHAGEAL ECHOCARDIOGRAM (TEE);  Surgeon: [name], MD;  Location: [facility];  Service: Open Heart Surgery;  Laterality: N/A;  . TONSILLECTOMY    . TRANSCATHETER AORTIC VALVE REPLACEMENT, TRANSFEMORAL N/A 09/25/2017   Procedure: TRANSCATHETER AORTIC VALVE REPLACEMENT, TRANSFEMORAL;  Surgeon: [name], MD;  Location: [facility];  Service: Open Heart Surgery;  Laterality: N/A;  . TYMPANOPLASTY Left 1998   ruptured ear drum with skin graft    Current Outpatient Medications  Medication Sig Dispense Refill  . allopurinol (ZYLOPRIM) 300 MG tablet Take 1 tablet by mouth once daily 90 tablet 1  . amLODipine (NORVASC) 10 MG tablet Take 1 tablet by mouth daily 90 tablet 3  . aspirin EC 81 MG EC tablet Take 1 tablet (81 mg total) by mouth daily.    . furosemide (LASIX) 20 MG tablet TAKE 1 TABLET BY MOUTH EVERY DAY 90 tablet 3  . IRON  PO Take 65 mg/day by mouth daily.     . potassium citrate (UROCIT-K) 10 MEQ (1080 MG) SR tablet Take 1 tablet (10 mEq total) by mouth 3 (three) times daily with meals. 300 tablet 1  . quinapril (ACCUPRIL) 40 MG tablet Take 1 tablet by mouth daily 90 tablet 1   No current facility-administered medications for this visit.     No Known Allergies  Social History   Socioeconomic History  . Marital status: Married    Spouse name: Not on file  . Number of children: 5  . Years of education: Not on file  . Highest education level: Not on file    Occupational History  . Occupation: Government social research officer  Social Needs  . Financial resource strain: Not on file  . Food insecurity:    Worry: Not on file    Inability: Not on file  . Transportation needs:    Medical: Not on file    Non-medical: Not on file  Tobacco Use  . Smoking status: Former Smoker    Packs/day: 1.00    Years: 20.00    Pack years: 20.00    Types: Cigarettes    Last attempt to quit: 09/15/1980    Years since quitting: 37.5  . Smokeless tobacco: Never Used  Substance and Sexual Activity  . Alcohol use: Yes    Alcohol/week: 7.0 standard drinks    Types: 1 Glasses of wine, 1 Shots of liquor, 5 Standard drinks or equivalent per week  . Drug use: No  . Sexual activity: Never  Lifestyle  . Physical activity:    Days per week: Not on file    Minutes per session: Not on file  . Stress: Not on file  Relationships  . Social connections:    Talks on phone: Not on file    Gets together: Not on file    Attends religious service: Not on file    Active member of club or organization: Not on file    Attends meetings of clubs or organizations: Not on file    Relationship status: Not on file  . Intimate partner violence:    Fear of current or ex partner: Not on file    Emotionally abused: Not on file    Physically abused: Not on file    Forced sexual activity: Not on file  Other Topics Concern  . Not on file  Social History Narrative   Married. 5 children. 4 grandkids. 1 greatgrandkid.       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       Hobbies: woodworking      Advised to consider advanced directives/hcpoa-thinks wife may have 66 from years ago.    Family History  Problem Relation Age of Onset  . Heart attack Mother 71       former smoker  . Heart attack Father 49       former smoker  . Hypertension Sister   . Heart attack Brother 26       Rheumatic fever    Review of Systems:  As stated in the HPI and otherwise negative.    BP 130/68   Pulse 75   Ht 6' (1.829 m)   Wt 236 lb (107 kg)   SpO2 94%   BMI 32.01 kg/m   Physical Examination:  General: Well developed, well nourished, NAD  HEENT: OP clear, mucus membranes moist  SKIN: warm, dry. No rashes. Neuro: No focal deficits  Musculoskeletal: Muscle strength 5/5 all  ext  Psychiatric: Mood and affect normal  Neck: No JVD, no carotid bruits, no thyromegaly, no lymphadenopathy.  Lungs:Clear bilaterally, no wheezes, rhonci, crackles Cardiovascular: Regular rate and rhythm. No murmurs, gallops or rubs. Abdomen:Soft. Bowel sounds present. Non-tender.  Extremities: No lower extremity edema. Pulses are 2 + in the bilateral DP/PT.   Echo May 2019: Left ventricle: The cavity size was normal. Wall thickness was   increased in a pattern of moderate LVH. Systolic function was   normal. The estimated ejection fraction was in the range of 60%   to 65%. Wall motion was normal; there were no regional wall   motion abnormalities. Doppler parameters are consistent with   abnormal left ventricular relaxation (grade 1 diastolic   dysfunction). - Aortic valve: Bioprosthetic aortic valve s/p TAVR. There is mild   peri-valvular regurgitation. No significant stenosis. Mean   gradient (S): 16 mm Hg. Valve area (VTI): 1.93 cm^2. - Aorta: Dilated aortic root and ascending aorta. Aortic root   dimension: 45 mm (ED). Ascending aortic diameter: 46 mm (S). - Mitral valve: Mildly calcified annulus. Mildly calcified leaflets   . - Left atrium: The atrium was mildly dilated. - Right ventricle: The cavity size was normal. Systolic function   was normal. - Tricuspid valve: Peak RV-RA gradient (S): 25 mm Hg. - Pulmonary arteries: PA peak pressure: 28 mm Hg (S). - Inferior vena cava: The vessel was normal in size. The   respirophasic diameter changes were in the normal range (>= 50%),   consistent with normal central venous pressure.  Impressions:  - Normal LV size with  moderate LV hypertrophy. EF 60-65%. Normal RV   size and systolic function. Bioprosthetic aortic valve s/p TAVR   with mild peri-valvular regurgitation and no significant   stenosis.  EKG:  EKG is not  ordered today. The ekg ordered today demonstrates   Recent Labs: 09/21/2017: ALT 18; B Natriuretic Peptide 67.9 09/26/2017: Magnesium 1.9 09/27/2017: Hemoglobin 11.5; Platelets 183 01/24/2018: BUN 19; Creatinine, Ser 0.98; Potassium 4.7; Sodium 140   Lipid Panel    Component Value Date/Time   CHOL 208 (H) 03/30/2017 0854   TRIG 119.0 03/30/2017 0854   TRIG 87 06/22/2006 1013   HDL 63.00 03/30/2017 0854   CHOLHDL 3 03/30/2017 0854   VLDL 23.8 03/30/2017 0854   LDLCALC 121 (H) 03/30/2017 0854   LDLDIRECT 136.2 10/25/2012 1009     Wt Readings from Last 3 Encounters:  03/11/18 236 lb (107 kg)  11/07/17 235 lb 12.8 oz (107 kg)  10/04/17 237 lb (107.5 kg)     Other studies Reviewed: Additional studies/ records that were reviewed today include: . Review of the above records demonstrates:   Assessment and Plan:   1. Severe AS: He is now s/p TAVR. His bioprosthetic AVR is working well by echo May 2019. There is mild perivalvular AI. Will continue ASA. SBE prophylaxis as indicated. He will need one year follow up in the valve clinic April 2020 and echo that day.  2. HTN: BP is controlled. Continue Norvasc and Accupril.   3. Chronic diastolic CHF: Volume status is ok today. Weight is stable. Continue Lasix.   4. Thoracic aortic aneurysm: Stable ascending aortic aneurysm by chest CTA August 2019. Will repeat chest CTA February 2020.   Current medicines are reviewed at length with the patient today.  The patient does not have concerns regarding medicines.  The following changes have been made:  no change  Labs/ tests ordered today include:  Orders Placed This Encounter  Procedures  . CT ANGIO CHEST AORTA W &/OR WO CONTRAST  . Basic Metabolic Panel (BMET)   Disposition:     Signed, Lauree Chandler, MD 03/11/2018 12:06 PM    [address], [place], [place]  [address] Phone: [phone]; Fax: [phone]

## 2018-03-21 DIAGNOSIS — N200 Calculus of kidney: Secondary | ICD-10-CM | POA: Diagnosis not present

## 2018-03-21 DIAGNOSIS — R351 Nocturia: Secondary | ICD-10-CM | POA: Diagnosis not present

## 2018-03-21 DIAGNOSIS — N401 Enlarged prostate with lower urinary tract symptoms: Secondary | ICD-10-CM | POA: Diagnosis not present

## 2018-05-21 ENCOUNTER — Encounter: Payer: Self-pay | Admitting: Family Medicine

## 2018-05-21 ENCOUNTER — Ambulatory Visit (INDEPENDENT_AMBULATORY_CARE_PROVIDER_SITE_OTHER): Payer: PPO | Admitting: Family Medicine

## 2018-05-21 VITALS — BP 134/68 | HR 68 | Temp 97.9°F | Ht 72.0 in | Wt 238.6 lb

## 2018-05-21 DIAGNOSIS — M7552 Bursitis of left shoulder: Secondary | ICD-10-CM | POA: Diagnosis not present

## 2018-05-21 MED ORDER — METHYLPREDNISOLONE ACETATE 80 MG/ML IJ SUSP
80.0000 mg | Freq: Once | INTRAMUSCULAR | Status: AC
  Administered 2018-05-21: 80 mg via INTRA_ARTICULAR

## 2018-05-21 NOTE — Progress Notes (Signed)
Subjective:  Barry Decker is a 84 y.o. year old very pleasant male patient who presents for/with See problem oriented charting ROS- no chest pain, shortness of breath, numbness or tingling into left arm, edema in arm/hand   Past Medical History-  Patient Active Problem List   Diagnosis Date Noted  . 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 Severe aortic stenosis     Priority: High  . Chronic diastolic CHF (congestive heart failure) ([place]) 03/30/2017    Priority: Medium  . Abdominal aortic ectasia ([place]) 04/07/2016    Priority: Medium  . HYPERCHOLESTEROLEMIA WITH HIGH HDL 01/31/2007    Priority: Medium  . Gout 01/31/2007    Priority: Medium  . Essential hypertension 01/31/2007    Priority: Medium  . History of malignant neoplasm of large intestine 01/31/2007    Priority: Medium  . Osteoarthritis of cervical spine 08/31/2015    Priority: Low  . Osteoarthritis of left knee 05/25/2014    Priority: Low  . Kidney stones   . Throat pain in adult 08/31/2015    Medications- reviewed and updated Current Outpatient Medications  Medication Sig Dispense Refill  . allopurinol (ZYLOPRIM) 300 MG tablet Take 1 tablet by mouth once daily 90 tablet 1  . amLODipine (NORVASC) 10 MG tablet Take 1 tablet by mouth daily 90 tablet 3  . aspirin EC 81 MG EC tablet Take 1 tablet (81 mg total) by mouth daily.    . furosemide (LASIX) 20 MG tablet TAKE 1 TABLET BY MOUTH EVERY DAY 90 tablet 3  . IRON PO Take 65 mg/day by mouth daily.     . potassium citrate (UROCIT-K) 10 MEQ (1080 MG) SR tablet Take 1 tablet (10 mEq total) by mouth 3 (three) times daily with meals. 300 tablet 1  . quinapril (ACCUPRIL) 40 MG tablet Take 1 tablet by mouth daily 90 tablet 1   No current facility-administered medications for this visit.     Objective: BP 134/68 (BP Location: Left Arm, Patient Position: Sitting, Cuff Size: Large)   Pulse 68   Temp 97.9  F (36.6 C) (Oral)   Ht 6' (1.829 m)   Wt 238 lb 9.6 oz (108.2 kg)   SpO2 94%   BMI 32.36 kg/m  Gen: NAD, resting comfortably CV: RRR 4 out of 6 systolic ejection murmur right upper sternal border Lungs: CTAB no crackles, wheeze, rhonchi Abdomen: soft/nontender/nondistended Ext: no edema Skin: warm, dry  Left Shoulder: Inspection reveals no abnormalities, atrophy or asymmetry. Palpation is normal with no tenderness over AC joint or bicipital groove. ROM is full in all planes but somewhat limited by pain. Rotator cuff strength limited by pain Signs of impingement with positive Neer and Hawkin's tests, empty can. Normal scapular function observed. No painful arc and no drop arm sign.  Shoulder injection Verbal consent obtained and verified. Sterile betadine prep. Furthur cleansed with alcohol. Topical analgesic spray: Ethyl chloride. Joint: left subacromial injection Approached in typical fashion with: posterior approach Completed without difficulty Meds: 3 cc lidocaine 2% no epi, 1 cc depomedrol 80mg /cc Needle:1.5 inch 25 gauge Aftercare instructions and Red flags advised. Immediate improvement in pain noted  Assessment/Plan:  Acute bursitis of left shoulder - Plan: methylPREDNISolone acetate (DEPO-MEDROL) injection 80 mg  Severe aortic stenosis S: has left shoulder pain for several weeks- wondered if he pulled something at first. Wonders now if it could be bursitis - had this in the past and responded well to  steroid injection.   At least 2-3 weeks of discomfort. Gets sharp pains with movements- mild to moderate aching. With remaining still has virtually no pain. Worse with lifting hand overhead. Denies repetitive overhead lifting/activity. No chest pain or shortness of breath with it.   Has tried some motrin- some relief with that.  Pain pattern overall not improving.  Denies right shoulder pain.  Denies pain with palpation of the shoulder. A/P: Patient with recurrence  of left shoulder bursitis-though this is a new acute problem at this office as not previously evaluated.  I discussed with patient trial of stronger NSAIDs but with minimal relief on Motrin wants to try new pathway.  Discussed possible prednisone but he has had significant relief from prior steroid injection-so we opted for that pathway.  Sports medicine follow-up if fails to improve From AVS:  " Appears you have recurrence of left shoulder bursitis. If you are not feeling better within 2 weeks or symptoms worsen- let me refer you to Dr. Paulla Fore of sports medicine in our building.  "  No problem-specific Assessment & Plan notes found for this encounter.  Looks like patient could have physical for next visit.-Last 03/30/2017 Future Appointments  Date Time Provider [place]  07/29/2018  7:45 AM [facility]  08/07/2018 10:40 AM [name], MD [facility]  08/08/2018  8:30 AM [facility]   Lab/Order associations: Acute bursitis of left shoulder - Plan: methylPREDNISolone acetate (DEPO-MEDROL) injection 80 mg  Meds ordered this encounter  Medications  . methylPREDNISolone acetate (DEPO-MEDROL) injection 80 mg   Return precautions advised.  Garret Reddish, MD

## 2018-05-21 NOTE — Patient Instructions (Addendum)
Health Maintenance Due  Topic Date Due  . TETANUS/TDAP-please get this at your pharmacy 12/26/2017   PHQ 2  Appears you have recurrence of left shoulder bursitis. If you are not feeling better within 2 weeks or symptoms worsen- let me refer you to Dr. Paulla Fore of sports medicine in our building.   You had an injection today.  Things to be aware of after injection are listed below: . You may experience no significant improvement or even a slight worsening in your symptoms during the first 24 to 48 hours.  After that we expect your symptoms to improve gradually over the next 2 weeks for the medicine to have its maximal effect.  You should continue to have improvement out to 6 weeks after your injection. . Dr. Yong Channel recommends icing the site of the injection for 20 minutes at least 2 times the day of your injection . You may shower but no swimming, tub bath or Jacuzzi for 24 hours. . If your bandage falls off this does not need to be replaced.  It is appropriate to remove the bandage after 4 hours. . You may resume light activities as tolerated unless otherwise directed per Dr. Yong Channel during your visit  POSSIBLE STEROID SIDE EFFECTS:  Side effects from injectable steroids tend to be less than when taken orally however you may experience some of the symptoms listed below.  If experienced these should only last for a short period of time. Change in menstrual flow  Edema (swelling)  Increased appetite Skin flushing (redness)  Skin rash/acne  Thrush (oral) Yeast vaginitis    Increased sweating  Depression Increased blood glucose levels Cramping and leg/calf  Euphoria (feeling happy)  POSSIBLE PROCEDURE SIDE EFFECTS: The side effects of the injection are usually fairly minimal however if you may experience some of the following side effects that are usually self-limited and will is off on their own.  If you are concerned please feel free to call the office with questions:  Increased numbness or  tingling  Nausea or vomiting  Swelling or bruising at the injection site   Please call our office if if you experience any of the following symptoms over the next week as these can be signs of infection:   Fever greater than 100.1F  Significant swelling at the injection site  Significant redness or drainage from the injection site  If after 2 weeks you are continuing to have worsening symptoms please call our office to discuss what the next appropriate actions should be including the potential for a return office visit or other diagnostic testing.

## 2018-06-17 ENCOUNTER — Other Ambulatory Visit: Payer: Self-pay | Admitting: Family Medicine

## 2018-07-29 ENCOUNTER — Other Ambulatory Visit: Payer: PPO | Admitting: *Deleted

## 2018-07-29 DIAGNOSIS — I350 Nonrheumatic aortic (valve) stenosis: Secondary | ICD-10-CM | POA: Diagnosis not present

## 2018-07-29 DIAGNOSIS — I5032 Chronic diastolic (congestive) heart failure: Secondary | ICD-10-CM

## 2018-07-29 DIAGNOSIS — I77810 Thoracic aortic ectasia: Secondary | ICD-10-CM | POA: Diagnosis not present

## 2018-07-29 LAB — BASIC METABOLIC PANEL WITH GFR
BUN/Creatinine Ratio: 16 (ref 10–24)
BUN: 18 mg/dL (ref 8–27)
CO2: 23 mmol/L (ref 20–29)
Calcium: 9 mg/dL (ref 8.6–10.2)
Chloride: 102 mmol/L (ref 96–106)
Creatinine, Ser: 1.12 mg/dL (ref 0.76–1.27)
GFR calc Af Amer: 70 mL/min/1.73
GFR calc non Af Amer: 60 mL/min/1.73
Glucose: 126 mg/dL — ABNORMAL HIGH (ref 65–99)
Potassium: 4.6 mmol/L (ref 3.5–5.2)
Sodium: 139 mmol/L (ref 134–144)

## 2018-08-07 ENCOUNTER — Encounter: Payer: PPO | Admitting: Family Medicine

## 2018-08-08 ENCOUNTER — Ambulatory Visit (INDEPENDENT_AMBULATORY_CARE_PROVIDER_SITE_OTHER)
Admission: RE | Admit: 2018-08-08 | Discharge: 2018-08-08 | Disposition: A | Discharge status: Home / Self Care | Payer: PPO | Source: Ambulatory Visit | Attending: Cardiovascular Disease | Admitting: Cardiovascular Disease

## 2018-08-08 DIAGNOSIS — I712 Thoracic aortic aneurysm, without rupture, unspecified: Secondary | ICD-10-CM

## 2018-08-08 MED ORDER — IOPAMIDOL (ISOVUE-370) INJECTION 76%
100.0000 mL | Freq: Once | INTRAVENOUS | Status: AC | PRN
  Administered 2018-08-08: 100 mL via INTRAVENOUS

## 2018-08-09 ENCOUNTER — Other Ambulatory Visit: Payer: Self-pay | Admitting: *Deleted

## 2018-08-09 DIAGNOSIS — Z952 Presence of prosthetic heart valve: Secondary | ICD-10-CM

## 2018-08-09 DIAGNOSIS — I350 Nonrheumatic aortic (valve) stenosis: Secondary | ICD-10-CM

## 2018-08-10 ENCOUNTER — Other Ambulatory Visit: Payer: Self-pay | Admitting: Family Medicine

## 2018-08-10 ENCOUNTER — Other Ambulatory Visit: Payer: Self-pay | Admitting: Cardiovascular Disease

## 2018-09-09 IMAGING — CT CT CTA ABD/PEL W/CM AND/OR W/O CM
3 of 13 series · 11 of 46 positions shown, 17 images · IV contrast (APPLIED)
Comparison: CT the abdomen and pelvis 03/09/2016.

CLINICAL DATA: 84-year-old male with history of severe aortic
stenosis. Preprocedural study prior to potential transcatheter
aortic valve replacement (TAVR) procedure.

EXAM:
CT ANGIOGRAPHY ABDOMEN AND PELVIS WITH CONTRAST AND WITHOUT CONTRAST
TECHNIQUE: Multidetector CT imaging of the abdomen and pelvis was performed
using the standard protocol during bolus administration of
intravenous contrast. Multiplanar reconstructed images and MIPs were
obtained and reviewed to evaluate the vascular anatomy.
CONTRAST:  100mL [id number] IOPAMIDOL ([id number]) INJECTION 76%

[Series 5: best diast 63 % · axial · 0.42mm/px · z∈[+1114,+1204]mm · 4 of 602 slices shown]
[im 76/602  soft-tissue]
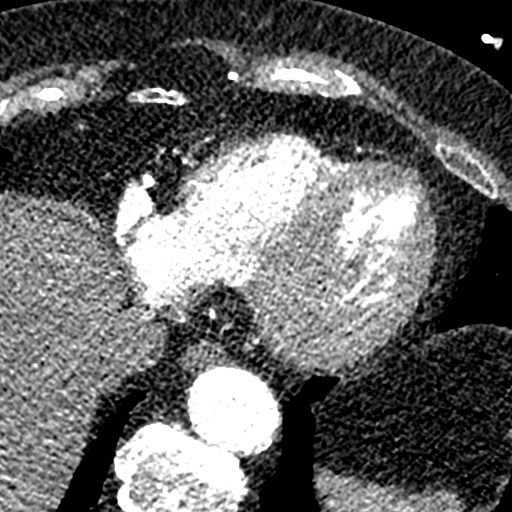
[im 151/602  soft-tissue]
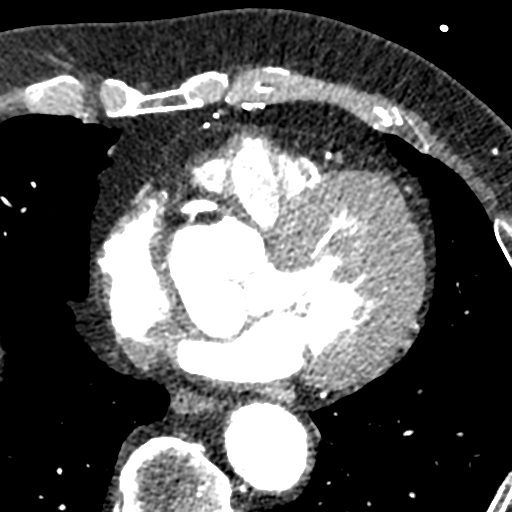
[im 226/602  soft-tissue]
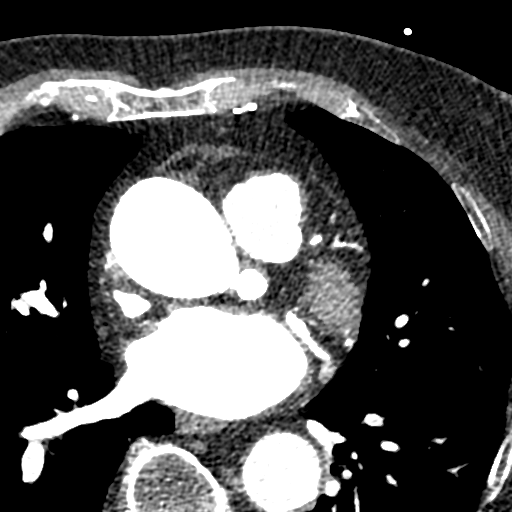
[im 301/602  soft-tissue]
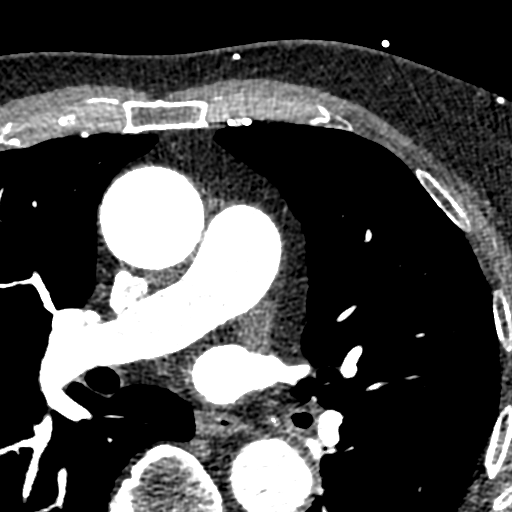

[Series 13: ax thins · axial · 0.59mm/px · z∈[+736,+1116]mm · 6 of 533 slices shown, 11 images]
[im 77/533  soft-tissue]
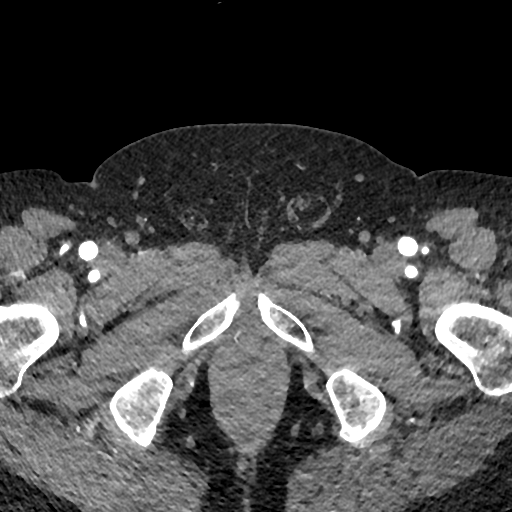
[im 77/533  bone]
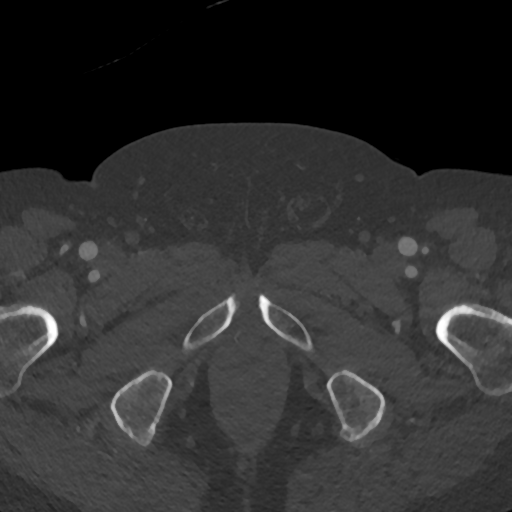
[im 153/533  soft-tissue]
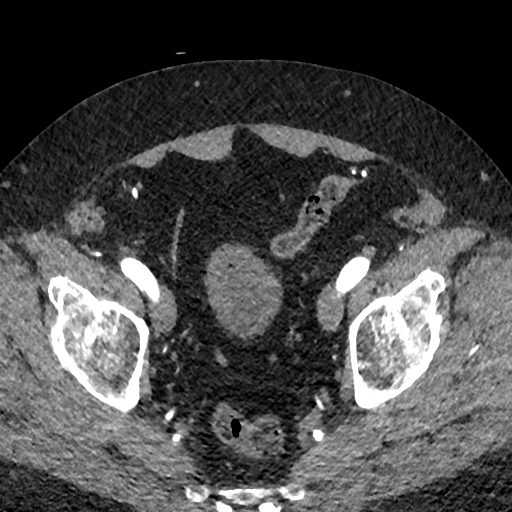
[im 229/533  soft-tissue]
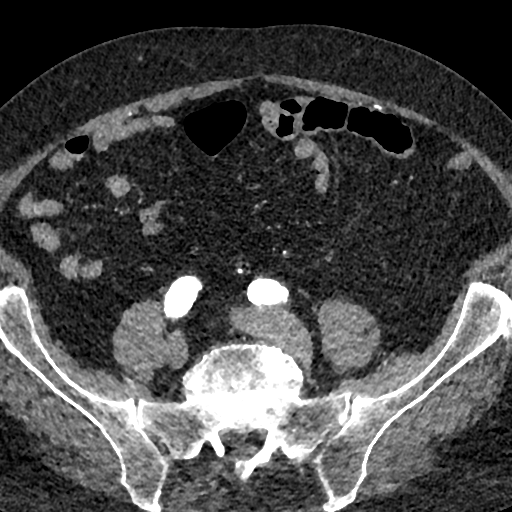
[im 229/533  lung]
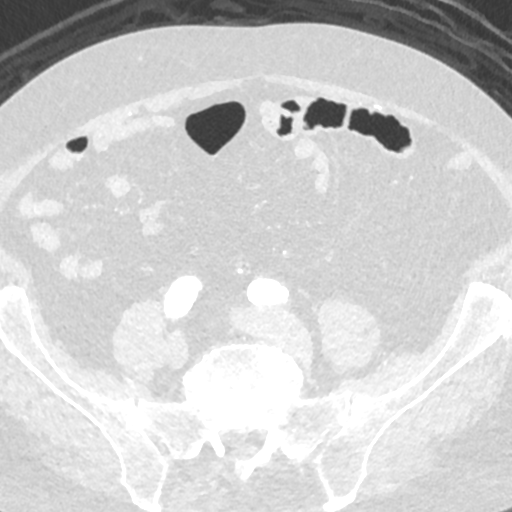
[im 305/533  soft-tissue]
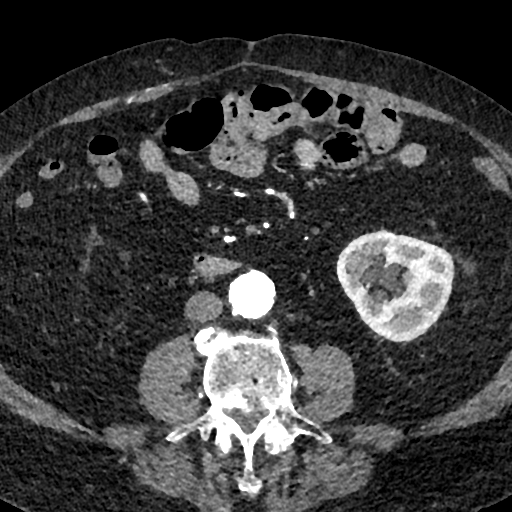
[im 305/533  lung]
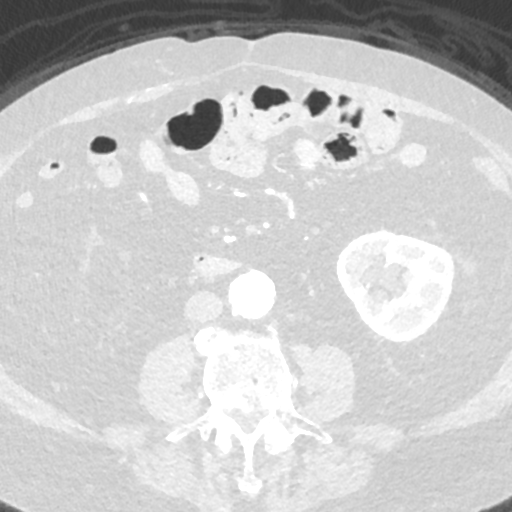
[im 381/533  soft-tissue]
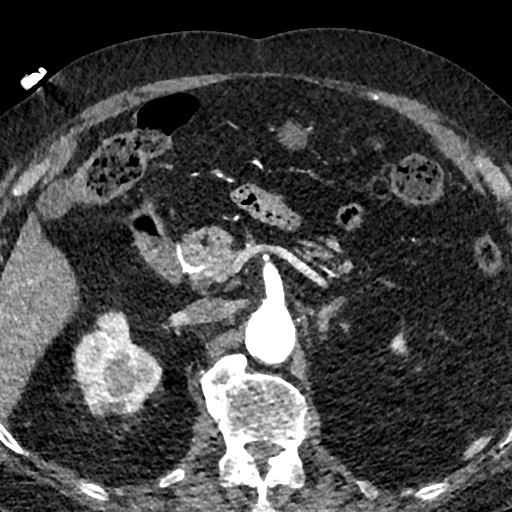
[im 381/533  lung]
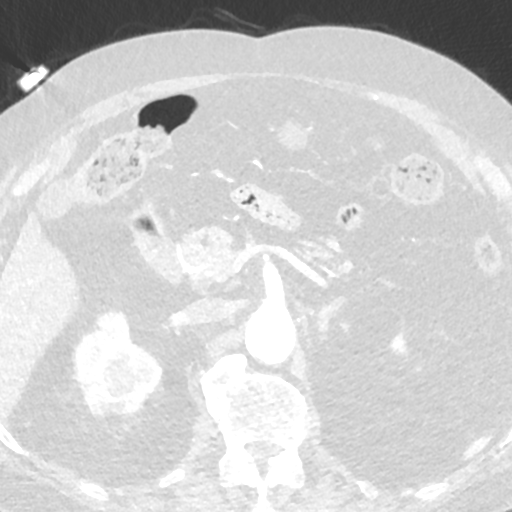
[im 457/533  soft-tissue]
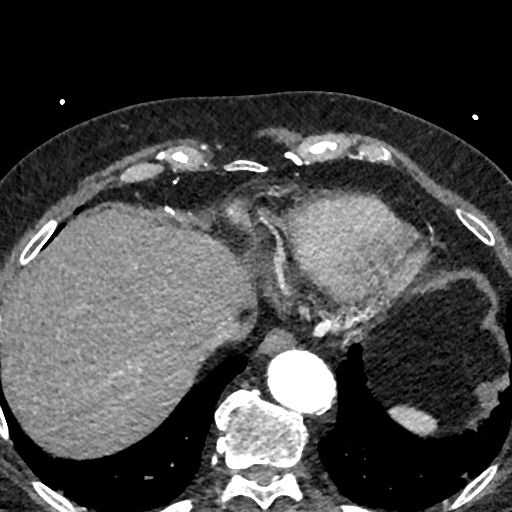
[im 457/533  lung]
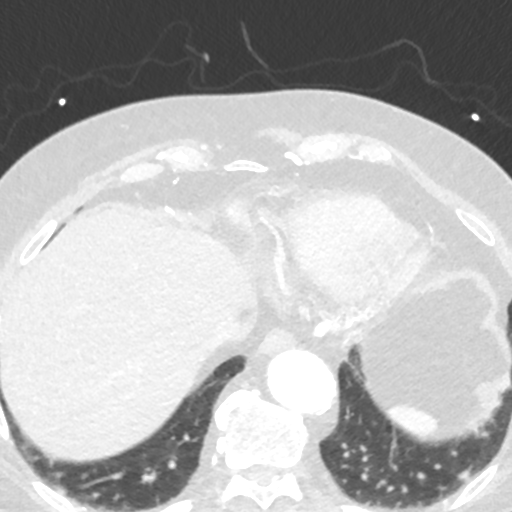

[Series 16: cor · coronal · 0.79mm/px · 1 of 130 slices shown, 2 images]
[im 65/130  soft-tissue]
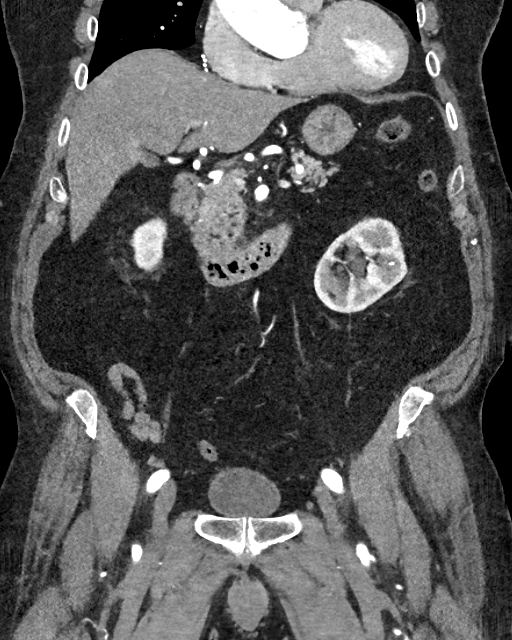
[im 65/130  bone]
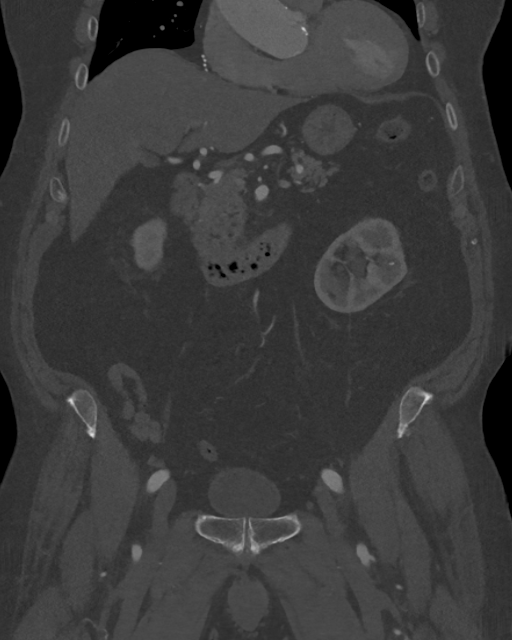

[11 of 46 positions shown; findings below may reference images not displayed]

FINDINGS: CTA ABDOMEN AND PELVIS FINDINGS

Lower thorax: Atherosclerotic calcifications in the left main, left
anterior descending and right coronary arteries. Severe thickening
calcification of the aortic valve.

Hepatobiliary: No suspicious cystic or solid hepatic lesions. No
intra or extrahepatic biliary ductal dilatation. Gallbladder is
normal in appearance.

Pancreas: No pancreatic mass. No pancreatic ductal dilatation. No
pancreatic or peripancreatic fluid or inflammatory changes.

Spleen: Unremarkable.

Adrenals/Urinary Tract: Multiple nonobstructive calculi are noted
within the collecting systems of both kidneys, the largest of which
measures 8 mm in the interpolar region of the left kidney. Exophytic
2.9 cm low-attenuation lesion in the lower pole of the left kidney,
compatible with a simple cyst. Right kidney and bilateral adrenal
glands are normal in appearance. No hydroureteronephrosis. Urinary
bladder is normal in appearance.

Stomach/Bowel: The appearance of the stomach is normal. No
pathologic dilatation of small bowel or colon. Status post right
hemicolectomy.

Vascular/Lymphatic: Aortic atherosclerosis, with vascular findings
and measurements pertinent to potential TAVR procedure, as detailed
below. No aneurysm or dissection noted in the abdominal or pelvic
vasculature. Celiac axis, superior mesenteric artery and inferior
mesenteric artery are all widely patent without hemodynamically
significant stenosis. Single right and 2 left renal arteries are all
widely patent without hemodynamically significant stenosis. No
lymphadenopathy noted in the abdomen or pelvis.

Reproductive: Prostate gland and seminal vesicles are unremarkable
in appearance.

Other: No significant volume of ascites.  No pneumoperitoneum.

Musculoskeletal: There are no aggressive appearing lytic or blastic
lesions noted in the visualized portions of the skeleton.

VASCULAR MEASUREMENTS PERTINENT TO TAVR:

AORTA:

Minimal Aortic 6iameter-EO x 24 mm

Severity of Aortic Calcification-moderate

RIGHT PELVIS:

Right Common Iliac Artery -

Minimal Eiameter-1S.0 x 12.2 mm

Tortuosity-mild

Calcification-moderate

Right External Iliac Artery -

Minimal Qiameter-XX.4 x 10.1 mm

Tortuosity-moderate

Calcification-mild

Right Common Femoral Artery -

Minimal Xiameter-RR.Z x 10.8 mm

Tortuosity-mild

Calcification-mild

LEFT PELVIS:

Left Common Iliac Artery -

Minimal Giameter-FF.9 x 13.1 mm

Tortuosity-mild

Calcification-moderate

Left External Iliac Artery -

Minimal Piameter-LL.Z x 10.8 mm

Tortuosity-moderate

Calcification-mild

Left Common Femoral Artery -

Minimal Wiameter-KZ.F x 11.2 mm

Tortuosity-mild

Calcification-mild

Review of the MIP images confirms the above findings.
IMPRESSION: 1. Vascular findings and measurements pertinent to potential TAVR
procedure, as detailed above.
2. Severe thickening calcification of the aortic valve, compatible
with the reported clinical history of severe aortic stenosis.
3. Multiple nonobstructive calculi in the left renal collecting
system measuring up to 8 mm in the interpolar region.
4. Additional incidental findings, as above.

## 2018-09-26 ENCOUNTER — Telehealth: Payer: Self-pay

## 2018-09-26 NOTE — Telephone Encounter (Signed)
Called pt to see if an ov can be scheduled. Pt has not been seen since 05/2018

## 2018-10-02 ENCOUNTER — Other Ambulatory Visit: Payer: Self-pay

## 2018-10-02 ENCOUNTER — Other Ambulatory Visit: Payer: Self-pay | Admitting: Physician Assistant

## 2018-10-02 ENCOUNTER — Telehealth (INDEPENDENT_AMBULATORY_CARE_PROVIDER_SITE_OTHER): Payer: PPO | Admitting: Physician Assistant

## 2018-10-02 VITALS — BP 128/68 | HR 61 | Wt 225.0 lb

## 2018-10-02 DIAGNOSIS — R911 Solitary pulmonary nodule: Secondary | ICD-10-CM

## 2018-10-02 DIAGNOSIS — I77810 Thoracic aortic ectasia: Secondary | ICD-10-CM

## 2018-10-02 DIAGNOSIS — Z7189 Other specified counseling: Secondary | ICD-10-CM

## 2018-10-02 DIAGNOSIS — I77819 Aortic ectasia, unspecified site: Secondary | ICD-10-CM

## 2018-10-02 DIAGNOSIS — R918 Other nonspecific abnormal finding of lung field: Secondary | ICD-10-CM

## 2018-10-02 DIAGNOSIS — I712 Thoracic aortic aneurysm, without rupture, unspecified: Secondary | ICD-10-CM

## 2018-10-02 DIAGNOSIS — Z952 Presence of prosthetic heart valve: Secondary | ICD-10-CM | POA: Diagnosis not present

## 2018-10-02 DIAGNOSIS — I350 Nonrheumatic aortic (valve) stenosis: Secondary | ICD-10-CM | POA: Diagnosis not present

## 2018-10-02 DIAGNOSIS — I10 Essential (primary) hypertension: Secondary | ICD-10-CM

## 2018-10-02 MED ORDER — AMOXICILLIN 500 MG PO TABS: 2000.0000 mg | ORAL_TABLET | ORAL | 12 refills | Status: DC

## 2018-10-02 NOTE — Progress Notes (Signed)
[facility]   MULTIDISCIPLINARY HEART VALVE TEAM   Evaluation Performed:  Follow-up visit  This visit type was conducted due to national recommendations for restrictions regarding the COVID-19 Pandemic (e.g. social distancing).  This format is felt to be most appropriate for this patient at this time.  All issues noted in this document were discussed and addressed.  No physical exam was performed (except for noted visual exam findings with Telehealth visits).  The patient has consented to conduct a Telehealth visit and understands insurance will be billed. Please see consent sent through MyChart to patient.  Date:  10/02/2018   ID:  Barry Decker, DOB 07/31/1932, MRN [id number]  Patient Location:  [address] [place] [address]   Provider location:   [address] [place], [place] [address]  PCP:  [name], MD  Cardiologist:  Lauree Chandler, MD  Electrophysiologist:  None   Chief Complaint:  1 year follow up s/p TAVR   History of Present Illness:    Barry Decker is a 85 y.o. male with a history of HTN, HLD, rheumatic fever in childhood, dialated aortic root and ascending aorta, and severe AS s/p TAVR (09/25/17)  who presents via audio/video conferencing for a telehealth visit today.    The patient does not symptoms concerning for COVID-19 infection (fever, chills, cough, or new SHORTNESS OF BREATH).   The patient has history of rheumatic fever during childhood and states that he has been told he had a heart murmur most of his life. He has been followed by Dr. Angelena Form since 2015. Echocardiograms have documented the presence of normal left ventricular systolic function with moderate aortic stenosis and aortic insufficiency with mildly dilated aortic root. Over the last several years the patient has developed progressive symptoms of exertional shortness of breath and fatigue. Follow-up transthoracic echocardiogram performed July 12, 2017 revealed  normal left ventricular systolic function with ejection fraction estimated 65-70%. There was severe calcification of the aortic annulus with at least moderate aortic stenosis and regurgitation. The aortic valve is trileaflet with thickened, moderately calcified leaflets. Peak velocity across the aortic valve measured as high as 3.8 m/s corresponding to mean transvalvular gradient estimated 33 mmHg. The DVI was reported 0.31. There was aortic insufficiency with pressure halftime measured 470 ms. The patient subsequently underwent diagnostic cardiac catheterization by Dr. Angelena Form on August 10, 2017. He was found to have mild nonobstructive coronary artery disease. Peak to peak and mean transvalvular gradients across the aortic valve were measured 33 and 32 mmHg respectively, corresponding to aortic valve area calculated 1.37 cm.   He ultimately underwent successful TAVR4/9/19with Edwards Sapien 3 THV (size 15mm) with Dr. Angelena Form and Dr. Cyndia Bent via transfemoral approach.Follow up echo revealed a stable valve withmild to moderate PVL; Mean gradient (S): 13 mm Hg; Peak gradient (S): 26 mm Hg. He was discharged on ASA and plavix. 1 mont echo showed EF 60%, normally functioning TAVR valve with mild PVL; mean gradient 16 mmHg.  Today he presents for a telehealth visit. No CP or SOB. No LE edema, orthopnea or PND. No dizziness or syncope. No blood in stool or urine. No palpitations. Still staying very active and doing yard work. Takes walks outside with wife.    Prior CV studies:   The following studies were reviewed today:  TAVR: 09/25/17 Transcatheter Aortic Valve Replacement - PercutaneousRightTransfemoral Approach Edwards Sapien 3 THV (size 34mm, model # [id number], serial # [id number])  ____________   2D ECHO 11/07/17 (30 day  post op) Study Conclusions - Left ventricle: The cavity size was normal. Wall thickness was increased in a pattern of moderate LVH. Systolic function  was normal. The estimated ejection fraction was in the range of 60% to 65%. Wall motion was normal; there were no regional wall motion abnormalities. Doppler parameters are consistent with abnormal left ventricular relaxation (grade 1 diastolic dysfunction). - Aortic valve: Bioprosthetic aortic valve s/p TAVR. There is mild peri-valvular regurgitation. No significant stenosis. Mean gradient (S): 16 mm Hg. Valve area (VTI): 1.93 cm^2. - Aorta: Dilated aortic root and ascending aorta. Aortic root dimension: 45 mm (ED). Ascending aortic diameter: 46 mm (S). - Mitral valve: Mildly calcified annulus. Mildly calcified leaflets - Left atrium: The atrium was mildly dilated. - Right ventricle: The cavity size was normal. Systolic function was normal. - Tricuspid valve: Peak RV-RA gradient (S): 25 mm Hg. - Pulmonary arteries: PA peak pressure: 28 mm Hg (S). - Inferior vena cava: The vessel was normal in size. The respirophasic diameter changes were in the normal range (>= 50%), consistent with normal central venous pressure. Impressions: - Normal LV size with moderate LV hypertrophy. EF 60-65%. Normal RV size and systolic function. Bioprosthetic aortic valve s/p TAVR with mild peri-valvular regurgitation and no significant stenosis.  Past Medical History:  Diagnosis Date   Aortic valve disease    s/p TVAR 09/25/17   Arthritis    "spinal" (11/16/2015)   Colon cancer ([facility])    precancer-partial colectomy   Dilated aortic root ([facility])    Elevated HDL    First degree AV block    Gout    Heart murmur    History of kidney stones    Hypertension    Squamous cell skin cancer    Past Surgical History:  Procedure Laterality Date   BACK SURGERY     CATARACT EXTRACTION W/ INTRAOCULAR LENS  IMPLANT, BILATERAL Bilateral 2017   COLONOSCOPY W/ POLYPECTOMY     FACET JOINT INJECTION  2009   LITHOTRIPSY  2010   LUMBAR LAMINECTOMY  ~ [facility]   precancerours   PARTIAL KNEE ARTHROPLASTY Left 11/16/2015   Procedure: LEFT KNEE UNICOMPARTMENTAL ;  Surgeon: [name], MD;  Location: [facility];  Service: Orthopedics;  Laterality: Left;   REPLACEMENT UNICONDYLAR JOINT KNEE Left 11/16/2015   RIGHT/LEFT HEART CATH AND CORONARY ANGIOGRAPHY N/A 08/10/2017   Procedure: RIGHT/LEFT HEART CATH AND CORONARY ANGIOGRAPHY;  Surgeon: [name], MD;  Location: [facility];  Service: Cardiovascular;  Laterality: N/A;   TEE WITHOUT CARDIOVERSION N/A 09/25/2017   Procedure: TRANSESOPHAGEAL ECHOCARDIOGRAM (TEE);  Surgeon: [name], MD;  Location: [facility];  Service: Open Heart Surgery;  Laterality: N/A;   TONSILLECTOMY     TRANSCATHETER AORTIC VALVE REPLACEMENT, TRANSFEMORAL N/A 09/25/2017   Procedure: TRANSCATHETER AORTIC VALVE REPLACEMENT, TRANSFEMORAL;  Surgeon: [name], MD;  Location: [facility];  Service: Open Heart Surgery;  Laterality: N/A;   TYMPANOPLASTY Left 1998   ruptured ear drum with skin graft     Current Meds  Medication Sig   allopurinol (ZYLOPRIM) 300 MG tablet Take 1 tablet by mouth once daily   amLODipine (NORVASC) 10 MG tablet Take 1 tablet by mouth once daily   aspirin EC 81 MG EC tablet Take 1 tablet (81 mg total) by mouth daily.   furosemide (LASIX) 20 MG tablet TAKE 1 TABLET BY MOUTH EVERY DAY   IRON PO Take 65 mg/day by mouth daily.  potassium citrate (UROCIT-K) 10 MEQ (1080 MG) SR tablet Take 1 tablet (10 mEq total) by mouth 3 (three) times daily with meals.   quinapril (ACCUPRIL) 40 MG tablet Take 1 tablet by mouth daily     Allergies:   Patient has no known allergies.   Social History   Tobacco Use   Smoking status: Former Smoker    Packs/day: 1.00    Years: 20.00    Pack years: 20.00    Types: Cigarettes    Last attempt to quit: 09/15/1980    Years since quitting: 38.0   Smokeless tobacco: Never Used  Substance Use Topics   Alcohol use:  Yes    Alcohol/week: 7.0 standard drinks    Types: 1 Glasses of wine, 1 Shots of liquor, 5 Standard drinks or equivalent per week   Drug use: No     Family Hx: The patient's family history includes Heart attack (age of onset: 22) in his brother; Heart attack (age of onset: 55) in his father and mother; Hypertension in his sister.  ROS:   Please see the history of present illness.    All other systems reviewed and are negative.   Labs/Other Tests and Data Reviewed:    Recent Labs: 07/29/2018: BUN 18; Creatinine, Ser 1.12; Potassium 4.6; Sodium 139   Recent Lipid Panel Lab Results  Component Value Date/Time   CHOL 208 (H) 03/30/2017 08:54 AM   TRIG 119.0 03/30/2017 08:54 AM   TRIG 87 06/22/2006 10:13 AM   HDL 63.00 03/30/2017 08:54 AM   CHOLHDL 3 03/30/2017 08:54 AM   LDLCALC 121 (H) 03/30/2017 08:54 AM   LDLDIRECT 136.2 10/25/2012 10:09 AM    Wt Readings from Last 3 Encounters:  10/02/18 225 lb (102.1 kg)  05/21/18 238 lb 9.6 oz (108.2 kg)  03/11/18 236 lb (107 kg)     Exam:    Vitals:   10/02/18 1306  BP: 128/68  Pulse: 61   Not completed as visit conducted over the phone and video portion did not work  ASSESSMENT & PLAN:    Severe AS s/p TAVR: doing excellent with NYHA class I symptoms. Echo has been pushed out until August. SBE prophylaxis discussed; he has amoxicillin. Continue Aspirin 81mg  daily.    HTN: BP well controlled. Continue current regimen   Dialated aortic root and ascending aorta: chest CTA 08/08/18 with stable 4.7 cm ascending thoracic aortic aneurysm. Semi annual follow up recommended. Will set this up for 01/2019.  Pulmonary nodules: he has had several pulmonary nodules that have recommended follow up. Most recent CT in 07/2018 reported a stable 0.7 cm nodular opacity in the LLL documenting 1 year of stability. Follow up in 1 year recommended. He has another scan due in 01/2019. I will have this arranged.  BMET the week prior  COVID-19  Education: the signs and symptoms of COVID-19 were discussed with the patient and how to seek care for testing (follow up with PCP or arrange E-visit).  The importance of social distancing was discussed today.  Patient Risk:   After full review of this patients clinical status, I feel that they are at least moderate risk at this time.  Time:   Today, I have spent 25 minutes with the patient with telehealth technology discussing post surgical recovery, symptoms and instructions going forward.    The visit was completed as an audio/video visit, but the video portion did not work on the patients end due to technical difficulties. They could seen me on  video but I was unable to see the patient.    Medication Adjustments/Labs and Tests Ordered: Current medicines are reviewed at length with the patient today.  Concerns regarding medicines are outlined above.  Tests Ordered: CT chest BMET   Medication Changes: Meds ordered this encounter  Medications   amoxicillin (AMOXIL) 500 MG tablet    Sig: Take 4 tablets (2,000 mg total) by mouth as directed. 1 hour prior to dental work including cleanings    Dispense:  12 tablet    Refill:  12    Order Specific Question:   Supervising Provider    Answer:   Burt Knack, [place]    Disposition:  in 4 month(s)  Signed, Angelena Form, PA-C  10/02/2018 2:07 PM    [address] [place], [place],   [address] Phone: [phone]; Fax: [phone]

## 2018-10-02 NOTE — Patient Instructions (Addendum)
Hello Barry Decker,   It was so nice to talk to you on the phone today. I am so glad you are doing so well. I just wanted to send you a recap of our discussion.   Please continue taking antibiotics prior to any dental work including cleanings (amoxicillin). Please continue on aspirin 81 mg and all your other medications.   Your 1 year echo has been rescheduled to August along with a CT scan prior to your appointment with Dr. Angelena Form. You will need lab work the week before. I have made you an appointment on 01/16/19 @ 11:45am, but you can go anytime between 8am-4pm that is convenient for you.   All appointment details are in your upcoming appointments tab on MyChart.  Please call us with any questions or concerns you may have and please stay safe during these uncertain times.  Nell Range

## 2018-10-03 ENCOUNTER — Ambulatory Visit: Payer: PPO | Admitting: Physician Assistant

## 2018-10-03 ENCOUNTER — Other Ambulatory Visit (HOSPITAL_COMMUNITY): Payer: PPO

## 2018-11-05 ENCOUNTER — Other Ambulatory Visit: Payer: Self-pay | Admitting: Family Medicine

## 2018-11-05 MED ORDER — QUINAPRIL HCL 40 MG PO TABS: 40.0000 mg | ORAL_TABLET | Freq: Every day | ORAL | 0 refills | Status: DC

## 2018-11-05 MED ORDER — ALLOPURINOL 300 MG PO TABS: 300.0000 mg | ORAL_TABLET | Freq: Every day | ORAL | 0 refills | Status: DC

## 2018-11-05 NOTE — Telephone Encounter (Signed)
MEDICATION:  Allopurinol 300 mg tablet Quinapril 40 mg tablet  Need new 90 day RX sent to pharmacy  PHARMACY:  [facility] # [phone]  IS THIS A 90 DAY SUPPLY :   IS PATIENT OUT OF MEDICATION:   IF NOT; HOW MUCH IS LEFT:   LAST APPOINTMENT DATE: @4 /02/2019  NEXT APPOINTMENT DATE:@Visit  date not found  OTHER COMMENTS:    **Let patient know to contact pharmacy at the end of the day to make sure medication is ready. **  ** Please notify patient to allow 48-72 hours to process**  **Encourage patient to contact the pharmacy for refills or they can request refills through [facility]**

## 2018-11-12 ENCOUNTER — Other Ambulatory Visit: Payer: Self-pay | Admitting: Cardiovascular Disease

## 2018-12-11 ENCOUNTER — Encounter: Payer: Self-pay | Admitting: Thoracic Surgery (Cardiothoracic Vascular Surgery)

## 2018-12-31 ENCOUNTER — Other Ambulatory Visit: Payer: Self-pay | Admitting: Family Medicine

## 2019-01-10 ENCOUNTER — Other Ambulatory Visit: Payer: Self-pay

## 2019-01-10 MED ORDER — ALLOPURINOL 300 MG PO TABS: 300.0000 mg | ORAL_TABLET | Freq: Every day | ORAL | 0 refills | Status: DC

## 2019-01-16 ENCOUNTER — Other Ambulatory Visit: Payer: PPO

## 2019-01-16 ENCOUNTER — Other Ambulatory Visit: Payer: Self-pay

## 2019-01-16 DIAGNOSIS — I712 Thoracic aortic aneurysm, without rupture, unspecified: Secondary | ICD-10-CM

## 2019-01-16 LAB — BASIC METABOLIC PANEL
BUN/Creatinine Ratio: 12 (ref 10–24)
BUN: 13 mg/dL (ref 8–27)
CO2: 24 mmol/L (ref 20–29)
Calcium: 9.3 mg/dL (ref 8.6–10.2)
Chloride: 99 mmol/L (ref 96–106)
Creatinine, Ser: 1.05 mg/dL (ref 0.76–1.27)
GFR calc Af Amer: 76 mL/min/{1.73_m2} (ref >59)
GFR calc non Af Amer: 65 mL/min/{1.73_m2} (ref >59)
Glucose: 122 mg/dL — ABNORMAL HIGH (ref 65–99)
Potassium: 4.2 mmol/L (ref 3.5–5.2)
Sodium: 139 mmol/L (ref 134–144)

## 2019-01-23 ENCOUNTER — Ambulatory Visit (INDEPENDENT_AMBULATORY_CARE_PROVIDER_SITE_OTHER)
Admission: RE | Admit: 2019-01-23 | Discharge: 2019-01-23 | Disposition: A | Discharge status: Home / Self Care | Payer: PPO | Source: Ambulatory Visit | Attending: Cardiovascular Disease | Admitting: Cardiovascular Disease

## 2019-01-23 ENCOUNTER — Ambulatory Visit (HOSPITAL_COMMUNITY): Payer: PPO | Attending: Cardiology

## 2019-01-23 ENCOUNTER — Ambulatory Visit: Payer: PPO | Admitting: Cardiovascular Disease

## 2019-01-23 ENCOUNTER — Other Ambulatory Visit: Payer: Self-pay

## 2019-01-23 ENCOUNTER — Encounter: Payer: Self-pay | Admitting: Cardiovascular Disease

## 2019-01-23 ENCOUNTER — Encounter: Payer: Self-pay | Admitting: Physician Assistant

## 2019-01-23 VITALS — BP 142/70 | HR 69 | Ht 72.0 in | Wt 240.2 lb

## 2019-01-23 DIAGNOSIS — Z952 Presence of prosthetic heart valve: Secondary | ICD-10-CM

## 2019-01-23 DIAGNOSIS — I712 Thoracic aortic aneurysm, without rupture, unspecified: Secondary | ICD-10-CM

## 2019-01-23 DIAGNOSIS — I350 Nonrheumatic aortic (valve) stenosis: Secondary | ICD-10-CM

## 2019-01-23 DIAGNOSIS — I5032 Chronic diastolic (congestive) heart failure: Secondary | ICD-10-CM

## 2019-01-23 DIAGNOSIS — I10 Essential (primary) hypertension: Secondary | ICD-10-CM

## 2019-01-23 MED ORDER — IOHEXOL 350 MG/ML SOLN
100.0000 mL | Freq: Once | INTRAVENOUS | Status: AC | PRN
  Administered 2019-01-23: 100 mL via INTRAVENOUS

## 2019-01-23 NOTE — Progress Notes (Signed)
Chief Complaint  Patient presents with  . Follow-up    HTN   History of Present Illness: 85 yo male with history of HTN, HLD, dilated aortic root and severe aortic stenosis s/p TAVR April 2019 who is here today for follow up. He had rheumatic fever as a child and was followed for aortic stenosis for many years. Echo 07/12/17 showed normal LV systolic function with GYBW=38-93% and severe aortic stenosis. His aortic root was enlarged. Chest CTA 07/23/17 with dilated ascending thoracic aorta with proximal to mid ascending thoracic aorta up to 4.6 cm. Cardiac cath February 2019 with mild non-obstructive CAD. He had TAVR 09/25/17 with placement of a 29 mm Edwards Sapien 3 valve from the transfemoral approach. Echo May 2019 with normal LV systolic function and normally functioning bioprosthetic AVR with mild perivalvular AI. Chest CTA February 2020 with stable 4.7 cm ascending thoracic aortic aneurysm. Repeat CTA today with result pending.   He is here today for follow up. The patient denies any chest pain, dyspnea, palpitations, lower extremity edema, orthopnea, PND, dizziness, near syncope or syncope.     Primary Care Physician: [name], MD  Past Medical History:  Diagnosis Date  . Aortic valve disease    s/p TVAR 09/25/17  . Arthritis    "spinal" (11/16/2015)  . Atypical nevus 05/14/2013   moderate atypia - left scapula  . Colon cancer ([facility])    precancer-partial colectomy  . Dilated aortic root ([facility])   . Elevated HDL   . First degree AV block   . Gout   . Heart murmur   . History of kidney stones   . Hypertension   . Squamous cell skin cancer 08/04/2011   Left outer brow - CX3 + 5FU    Past Surgical History:  Procedure Laterality Date  . BACK SURGERY    . CATARACT EXTRACTION W/ INTRAOCULAR LENS  IMPLANT, BILATERAL Bilateral 2017  . COLONOSCOPY W/ POLYPECTOMY    . FACET JOINT INJECTION  2009  . LITHOTRIPSY  2010  . LUMBAR LAMINECTOMY  ~ 1992  . PARTIAL COLECTOMY  1997   precancerours  . PARTIAL KNEE ARTHROPLASTY Left 11/16/2015   Procedure: LEFT KNEE UNICOMPARTMENTAL ;  Surgeon: [name], MD;  Location: [facility];  Service: Orthopedics;  Laterality: Left;  . REPLACEMENT UNICONDYLAR JOINT KNEE Left 11/16/2015  . RIGHT/LEFT HEART CATH AND CORONARY ANGIOGRAPHY N/A 08/10/2017   Procedure: RIGHT/LEFT HEART CATH AND CORONARY ANGIOGRAPHY;  Surgeon: [name], MD;  Location: [facility];  Service: Cardiovascular;  Laterality: N/A;  . TEE WITHOUT CARDIOVERSION N/A 09/25/2017   Procedure: TRANSESOPHAGEAL ECHOCARDIOGRAM (TEE);  Surgeon: [name], MD;  Location: [facility];  Service: Open Heart Surgery;  Laterality: N/A;  . TONSILLECTOMY    . TRANSCATHETER AORTIC VALVE REPLACEMENT, TRANSFEMORAL N/A 09/25/2017   Procedure: TRANSCATHETER AORTIC VALVE REPLACEMENT, TRANSFEMORAL;  Surgeon: [name], MD;  Location: [facility];  Service: Open Heart Surgery;  Laterality: N/A;  . TYMPANOPLASTY Left 1998   ruptured ear drum with skin graft    Current Outpatient Medications  Medication Sig Dispense Refill  . allopurinol (ZYLOPRIM) 300 MG tablet Take 1 tablet (300 mg total) by mouth daily. 30 tablet 0  . amLODipine (NORVASC) 10 MG tablet Take 1 tablet by mouth once daily 90 tablet 1  . amoxicillin (AMOXIL) 500 MG tablet Take 4 tablets (2,000 mg total) by mouth as directed. 1 hour prior to dental work including cleanings 12 tablet 12  .  aspirin EC 81 MG EC tablet Take 1 tablet (81 mg total) by mouth daily.    . furosemide (LASIX) 20 MG tablet TAKE 1 TABLET BY MOUTH EVERY DAY 90 tablet 3  . IRON PO Take 65 mg/day by mouth daily.     . potassium citrate (UROCIT-K) 10 MEQ (1080 MG) SR tablet Take 1 tablet (10 mEq total) by mouth 3 (three) times daily with meals. 300 tablet 1  . quinapril (ACCUPRIL) 40 MG tablet Take 1 tablet by mouth daily 90 tablet 0   No current facility-administered medications for this visit.     No Known Allergies   Social History   Socioeconomic History  . Marital status: Married    Spouse name: Not on file  . Number of children: 5  . Years of education: Not on file  . Highest education level: Not on file  Occupational History  . Occupation: Government social research officer  Social Needs  . Financial resource strain: Not on file  . Food insecurity    Worry: Not on file    Inability: Not on file  . Transportation needs    Medical: Not on file    Non-medical: Not on file  Tobacco Use  . Smoking status: Former Smoker    Packs/day: 1.00    Years: 20.00    Pack years: 20.00    Types: Cigarettes    Quit date: 09/15/1980    Years since quitting: 38.3  . Smokeless tobacco: Never Used  Substance and Sexual Activity  . Alcohol use: Yes    Alcohol/week: 7.0 standard drinks    Types: 1 Glasses of wine, 1 Shots of liquor, 5 Standard drinks or equivalent per week  . Drug use: No  . Sexual activity: Never  Lifestyle  . Physical activity    Days per week: Not on file    Minutes per session: Not on file  . Stress: Not on file  Relationships  . Social Herbalist on phone: Not on file    Gets together: Not on file    Attends religious service: Not on file    Active member of club or organization: Not on file    Attends meetings of clubs or organizations: Not on file    Relationship status: Not on file  . Intimate partner violence    Fear of current or ex partner: Not on file    Emotionally abused: Not on file    Physically abused: Not on file    Forced sexual activity: Not on file  Other Topics Concern  . Not on file  Social History Narrative   Married. 5 children. 4 grandkids. 1 greatgrandkid.       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       Hobbies: woodworking      Advised to consider advanced directives/hcpoa-thinks wife may have 51 from years ago.    Family History  Problem Relation Age of Onset  . Heart attack Mother 69       former smoker  . Heart  attack Father 78       former smoker  . Hypertension Sister   . Heart attack Brother 26       Rheumatic fever    Review of Systems:  As stated in the HPI and otherwise negative.   BP (!) 142/70   Pulse 69   Ht 6' (1.829 m)   Wt 240 lb 3.2 oz (109 kg)   SpO2 97%  BMI 32.58 kg/m   Physical Examination:  General: Well developed, well nourished, NAD  HEENT: OP clear, mucus membranes moist  SKIN: warm, dry. No rashes. Neuro: No focal deficits  Musculoskeletal: Muscle strength 5/5 all ext  Psychiatric: Mood and affect normal  Neck: No JVD, no carotid bruits, no thyromegaly, no lymphadenopathy.  Lungs:Clear bilaterally, no wheezes, rhonci, crackles Cardiovascular: Regular rate and rhythm. No murmurs, gallops or rubs. Abdomen:Soft. Bowel sounds present. Non-tender.  Extremities: No lower extremity edema. Pulses are 2 + in the bilateral DP/PT.  Echo May 2019: Left ventricle: The cavity size was normal. Wall thickness was   increased in a pattern of moderate LVH. Systolic function was   normal. The estimated ejection fraction was in the range of 60%   to 65%. Wall motion was normal; there were no regional wall   motion abnormalities. Doppler parameters are consistent with   abnormal left ventricular relaxation (grade 1 diastolic   dysfunction). - Aortic valve: Bioprosthetic aortic valve s/p TAVR. There is mild   peri-valvular regurgitation. No significant stenosis. Mean   gradient (S): 16 mm Hg. Valve area (VTI): 1.93 cm^2. - Aorta: Dilated aortic root and ascending aorta. Aortic root   dimension: 45 mm (ED). Ascending aortic diameter: 46 mm (S). - Mitral valve: Mildly calcified annulus. Mildly calcified leaflets   . - Left atrium: The atrium was mildly dilated. - Right ventricle: The cavity size was normal. Systolic function   was normal. - Tricuspid valve: Peak RV-RA gradient (S): 25 mm Hg. - Pulmonary arteries: PA peak pressure: 28 mm Hg (S). - Inferior vena cava: The  vessel was normal in size. The   respirophasic diameter changes were in the normal range (>= 50%),   consistent with normal central venous pressure.  Impressions:  - Normal LV size with moderate LV hypertrophy. EF 60-65%. Normal RV   size and systolic function. Bioprosthetic aortic valve s/p TAVR   with mild peri-valvular regurgitation and no significant   stenosis.  EKG:  EKG is ordered today. The ekg ordered today demonstrates Sinus, rate 69 bpm. 1st degree AV block. LBBB  Recent Labs: 01/16/2019: BUN 13; Creatinine, Ser 1.05; Potassium 4.2; Sodium 139   Lipid Panel    Component Value Date/Time   CHOL 208 (H) 03/30/2017 0854   TRIG 119.0 03/30/2017 0854   TRIG 87 06/22/2006 1013   HDL 63.00 03/30/2017 0854   CHOLHDL 3 03/30/2017 0854   VLDL 23.8 03/30/2017 0854   LDLCALC 121 (H) 03/30/2017 0854   LDLDIRECT 136.2 10/25/2012 1009     Wt Readings from Last 3 Encounters:  01/23/19 240 lb 3.2 oz (109 kg)  10/02/18 225 lb (102.1 kg)  05/21/18 238 lb 9.6 oz (108.2 kg)     Other studies Reviewed: Additional studies/ records that were reviewed today include: . Review of the above records demonstrates:   Assessment and Plan:   1. Severe AS: He is now s/p TAVR. His bioprosthetic AVR is working well by echo today. Continue ASA daily and SBE prophylaxis as needed.    2. HTN: BP is well controlled.  3. Chronic diastolic CHF: No signs of volume overload. Continue Lasix.   4. Thoracic aortic aneurysm: Stable ascending aortic aneurysm by chest CTA in February 2020. Repeat CTA today.     5. LBBB: New post TAVR. No dizziness or syncope.   Current medicines are reviewed at length with the patient today.  The patient does not have concerns regarding medicines.  The following changes have  been made:  no change  Labs/ tests ordered today include:   No orders of the defined types were placed in this encounter.  Disposition:  Follow up one year.   Signed, Lauree Chandler, MD 01/23/2019 1:24 PM    [facility] [place], [place], [place]  [address] Phone: [phone]; Fax: [phone]

## 2019-01-23 NOTE — Patient Instructions (Signed)

## 2019-01-31 DIAGNOSIS — Z961 Presence of intraocular lens: Secondary | ICD-10-CM | POA: Diagnosis not present

## 2019-01-31 DIAGNOSIS — H18413 Arcus senilis, bilateral: Secondary | ICD-10-CM | POA: Diagnosis not present

## 2019-01-31 DIAGNOSIS — H02833 Dermatochalasis of right eye, unspecified eyelid: Secondary | ICD-10-CM | POA: Diagnosis not present

## 2019-01-31 DIAGNOSIS — H02133 Senile ectropion of right eye, unspecified eyelid: Secondary | ICD-10-CM | POA: Diagnosis not present

## 2019-03-10 DIAGNOSIS — L309 Dermatitis, unspecified: Secondary | ICD-10-CM | POA: Diagnosis not present

## 2019-03-10 DIAGNOSIS — B351 Tinea unguium: Secondary | ICD-10-CM | POA: Diagnosis not present

## 2019-03-13 ENCOUNTER — Other Ambulatory Visit: Payer: Self-pay

## 2019-03-13 MED ORDER — QUINAPRIL HCL 40 MG PO TABS: 40.0000 mg | ORAL_TABLET | Freq: Every day | ORAL | 0 refills | Status: DC

## 2019-03-17 ENCOUNTER — Encounter: Payer: Self-pay | Admitting: Family Medicine

## 2019-03-17 NOTE — Patient Instructions (Addendum)
Health Maintenance Due  Topic Date Due  . INFLUENZA VACCINE 02/2019  ([facility]) 01/18/2019    no changes today other than trimming off 8 lbs gained from last year   Please stop by lab before you go If you do not have mychart- we will call you about results within 5 business days of [place] receiving them.  If you have mychart- we will send your results within 3 business days of [place] receiving them.  If abnormal or we want to clarify a result, we will call or mychart you to make sure you receive the message.  If you have questions or concerns or don't hear within 5-7 days, please send [place] a message or call us.

## 2019-03-17 NOTE — Progress Notes (Signed)
Phone: [phone]   Subjective:  Patient presents today for their annual physical. Chief complaint-noted.   See problem oriented charting- ROS- full  review of systems was completed and negative  except for: tinnitus, constipation, SOB, joint pain, back pain  The following were reviewed and entered/updated in epic: Past Medical History:  Diagnosis Date  . Aortic valve disease    s/p TVAR 09/25/17  . Arthritis    "spinal" (11/16/2015)  . Atypical nevus 05/14/2013   moderate atypia - left scapula  . Colon cancer ([place])    precancer-partial colectomy  . Dilated aortic root ([place])   . Elevated HDL   . First degree AV block   . Gout   . Heart murmur   . History of kidney stones   . Hypertension   . Squamous cell skin cancer 08/04/2011   Left outer brow - CX3 + 5FU   Patient Active Problem List   Diagnosis Date Noted  . 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 Severe aortic stenosis     Priority: High  . Chronic diastolic CHF (congestive heart failure) ([place]) 03/30/2017    Priority: Medium  . Abdominal aortic ectasia ([place]) 04/07/2016    Priority: Medium  . HYPERCHOLESTEROLEMIA WITH HIGH HDL 01/31/2007    Priority: Medium  . Gout 01/31/2007    Priority: Medium  . Essential hypertension 01/31/2007    Priority: Medium  . History of malignant neoplasm of large intestine 01/31/2007    Priority: Medium  . Osteoarthritis of cervical spine 08/31/2015    Priority: Low  . Osteoarthritis of left knee 05/25/2014    Priority: Low  . Kidney stones   . Throat pain in adult 08/31/2015   Past Surgical History:  Procedure Laterality Date  . BACK SURGERY    . CATARACT EXTRACTION W/ INTRAOCULAR LENS  IMPLANT, BILATERAL Bilateral 2017  . COLONOSCOPY W/ POLYPECTOMY    . FACET JOINT INJECTION  2009  . LITHOTRIPSY  2010  . LUMBAR LAMINECTOMY  ~ 1992  . PARTIAL COLECTOMY  1997   precancerours  . PARTIAL KNEE  ARTHROPLASTY Left 11/16/2015   Procedure: LEFT KNEE UNICOMPARTMENTAL ;  Surgeon: [name], MD;  Location: [facility];  Service: Orthopedics;  Laterality: Left;  . REPLACEMENT UNICONDYLAR JOINT KNEE Left 11/16/2015  . RIGHT/LEFT HEART CATH AND CORONARY ANGIOGRAPHY N/A 08/10/2017   Procedure: RIGHT/LEFT HEART CATH AND CORONARY ANGIOGRAPHY;  Surgeon: [name], MD;  Location: [facility];  Service: Cardiovascular;  Laterality: N/A;  . TEE WITHOUT CARDIOVERSION N/A 09/25/2017   Procedure: TRANSESOPHAGEAL ECHOCARDIOGRAM (TEE);  Surgeon: [name], MD;  Location: [facility];  Service: Open Heart Surgery;  Laterality: N/A;  . TONSILLECTOMY    . TRANSCATHETER AORTIC VALVE REPLACEMENT, TRANSFEMORAL N/A 09/25/2017   Procedure: TRANSCATHETER AORTIC VALVE REPLACEMENT, TRANSFEMORAL;  Surgeon: [name], MD;  Location: [facility];  Service: Open Heart Surgery;  Laterality: N/A;  . TYMPANOPLASTY Left 1998   ruptured ear drum with skin graft    Family History  Problem Relation Age of Onset  . Heart attack Mother 84       former smoker  . Heart attack Father 78       former smoker  . Hypertension Sister   . Heart attack Brother 26       Rheumatic fever    Medications- reviewed and updated Current Outpatient Medications  Medication Sig Dispense Refill  . allopurinol (ZYLOPRIM) 300  MG tablet Take 1 tablet (300 mg total) by mouth daily. 30 tablet 0  . amLODipine (NORVASC) 10 MG tablet Take 1 tablet by mouth once daily 90 tablet 1  . amoxicillin (AMOXIL) 500 MG tablet Take 4 tablets (2,000 mg total) by mouth as directed. 1 hour prior to dental work including cleanings 12 tablet 12  . aspirin EC 81 MG EC tablet Take 1 tablet (81 mg total) by mouth daily.    . furosemide (LASIX) 20 MG tablet TAKE 1 TABLET BY MOUTH EVERY DAY 90 tablet 3  . IRON PO Take 65 mg/day by mouth daily.     . potassium citrate (UROCIT-K) 10 MEQ (1080 MG) SR tablet Take 1 tablet (10 mEq total) by  mouth 3 (three) times daily with meals. 300 tablet 1  . quinapril (ACCUPRIL) 40 MG tablet Take 1 tablet (40 mg total) by mouth daily. 90 tablet 0   No current facility-administered medications for this visit.     Allergies-reviewed and updated No Known Allergies  Social History   Social History Narrative   Married. 5 children. 4 grandkids. 1 greatgrandkid.       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       Hobbies: woodworking      Advised to consider advanced directives/hcpoa-thinks wife may have 44 from years ago.   Objective  Objective:  BP 138/64   Pulse 70   Temp 98.3 F (36.8 C)   Ht 6' (1.829 m)   Wt 246 lb (111.6 kg)   SpO2 94%   BMI 33.36 kg/m  Gen: NAD, resting comfortably HEENT: Mucous membranes are moist. Oropharynx normal. TM obstructed bilaterally with cerumen- removed with curette Neck: no thyromegaly CV: RRR no murmurs rubs or gallops Lungs: CTAB no crackles, wheeze, rhonchi Abdomen: soft/nontender/nondistended/normal bowel sounds. No rebound or guarding.  Ext: trace edema Skin: warm, dry Neuro: grossly normal, moves all extremities, PERRLA, slightly hard of hearing   Assessment and Plan  85 y.o. male presenting for annual physical.  Health Maintenance counseling: 1. Anticipatory guidance: Patient counseled regarding regular dental exams -q6 months, eye exams -yearly,  avoiding smoking and second hand smoke , limiting alcohol to 2 beverages per day .   2. Risk factor reduction:  Advised patient of need for regular exercise and diet rich and fruits and vegetables to reduce risk of heart attack and stroke. Exercise- walking every other day 1/2 mile. Diet-up 8 lbs from last in office visit- plans to improve diet.  Wt Readings from Last 3 Encounters:  03/21/19 246 lb (111.6 kg)  01/23/19 240 lb 3.2 oz (109 kg)  10/02/18 225 lb (102.1 kg)  3. Immunizations/screenings/ancillary studies- fully up to date on immunizatoins Immunization  History  Administered Date(s) Administered  . Influenza, High Dose Seasonal PF 03/26/2014, 04/08/2017, 03/20/2018, 02/03/2019, 03/04/2019  . Influenza,inj,Quad PF,6+ Mos 03/14/2013  . Influenza-Unspecified 04/06/2015, 03/02/2016, 02/28/2017, 03/20/2018  . Pneumococcal Conjugate-13 05/25/2014  . Pneumococcal Polysaccharide-23 06/19/2004  . Td 12/27/2007  . Tdap 05/22/2018  . Zoster 06/19/2008  . Zoster Recombinat (Shingrix) 06/19/2018, 12/26/2018   4. Prostate cancer screening- stable nocturia twice a night. past age to based screening recommendations. Sees Dr. Jeffie Pollock for kidney stones Lab Results  Component Value Date   PSA 1.36 10/04/2009   PSA 1.19 12/27/2007   PSA 0.80 06/22/2006   5. Colon cancer screening - past age to based screening recommendations. Partial colectomy for precancerious polyps around 2011- was told no more colonoscopies- see letter from Dr. Loletha Carrow  on 08/17/16.  6. Skin cancer screening- sees Dr. Denna Haggard recently. advised regular sunscreen use. Denies worrisome, changing, or new skin lesions.  7.  Former smoker-quit in 1980s-urine is followed by urology  Status of chronic or acute concerns  Gout- controlled on Allopurinol 300 mg, pt has not had any gout flare ups-uric acid well controlled last check-update today Lab Results  Component Value Date   LABURIC 5.2 03/30/2017    HTN- controlled on Accupril 40mg , amlodipine 10mg , Lasix 20 mg.  Pt states he checks his BP occasionally at home and they are ranging under 140/90.  Reasonable control today.  On potassium through urology which counter balances Lasix-also on quinapril which increases potassium  Chronic diastolic CHF-patient remains on Lasix 20 mg. Has stable baseline shortness of breath- relates more to inactivity. If edema issues worsened couls always consider cutting back amlodipine which is not ideal but is helipng with BP   Hypercholesterolemia-elevated but not on medication for primary prevention- given age over  28 without history of heart attack or stroke we opted to remain off statin. Focus on lifestyle changes.   Patient with history of transcatheter aortic valve replacement using bioprosthesis for severe aortic stenosis-he has been doing well.  Aortic stenosis prior rheumatic fever.  Dental prophylaxis recommended per cardiology  Abdominal aortic ectasia- noted on urology work-up October 2017 with 5-year follow-up ultrasound recommended.  We will plan to check in 2022  Constipation- he is considering metamucil  Joint pain, back pain- mainly just tolerates this- very sparing aleve  Macrocytic anemia previously noted- update mma, folate rbc, b12 Lab Results  Component Value Date   WBC 6.9 09/27/2017   HGB 11.5 (L) 09/27/2017   HCT 34.0 (L) 09/27/2017   MCV 104.3 (H) 09/27/2017   PLT 183 09/27/2017   Recommended follow up: Patient prefers yearly physical-offered 71-month follow-up to recheck blood pressure  Lab/Order associations: had piece of toast this AM- with some butter   ICD-10-CM   1. Preventative health care  Z00.00 Lipid panel    Comprehensive metabolic panel    CBC (no diff)  2. Essential hypertension  I10 Comprehensive metabolic panel    CBC (no diff)  3. HYPERCHOLESTEROLEMIA WITH HIGH HDL  E78.00 Lipid panel  4. Abdominal aortic ectasia (HCC)  I77.811   5. 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 Severe aortic stenosis  I35.0     No orders of the defined types were placed in this encounter.   Return precautions advised.  Garret Reddish, MD

## 2019-03-21 ENCOUNTER — Other Ambulatory Visit: Payer: Self-pay

## 2019-03-21 ENCOUNTER — Ambulatory Visit (INDEPENDENT_AMBULATORY_CARE_PROVIDER_SITE_OTHER): Payer: PPO | Admitting: Family Medicine

## 2019-03-21 VITALS — BP 138/64 | HR 70 | Temp 98.3°F | Ht 72.0 in | Wt 246.0 lb

## 2019-03-21 DIAGNOSIS — D539 Nutritional anemia, unspecified: Secondary | ICD-10-CM | POA: Diagnosis not present

## 2019-03-21 DIAGNOSIS — M1A09X0 Idiopathic chronic gout, multiple sites, without tophus (tophi): Secondary | ICD-10-CM

## 2019-03-21 DIAGNOSIS — Z0000 Encounter for general adult medical examination without abnormal findings: Secondary | ICD-10-CM | POA: Diagnosis not present

## 2019-03-21 DIAGNOSIS — I77811 Abdominal aortic ectasia: Secondary | ICD-10-CM

## 2019-03-21 DIAGNOSIS — I350 Nonrheumatic aortic (valve) stenosis: Secondary | ICD-10-CM | POA: Diagnosis not present

## 2019-03-21 DIAGNOSIS — I10 Essential (primary) hypertension: Secondary | ICD-10-CM

## 2019-03-21 DIAGNOSIS — Z953 Presence of xenogenic heart valve: Secondary | ICD-10-CM | POA: Diagnosis not present

## 2019-03-21 DIAGNOSIS — E7800 Pure hypercholesterolemia, unspecified: Secondary | ICD-10-CM

## 2019-03-21 LAB — LIPID PANEL
Cholesterol: 199 mg/dL (ref 0–200)
HDL: 60 mg/dL (ref >39.00)
LDL Cholesterol: 109 mg/dL — ABNORMAL HIGH (ref 0–99)
NonHDL: 138.83
Total CHOL/HDL Ratio: 3
Triglycerides: 149 mg/dL (ref 0.0–149.0)
VLDL: 29.8 mg/dL (ref 0.0–40.0)

## 2019-03-21 LAB — COMPREHENSIVE METABOLIC PANEL
ALT: 17 U/L (ref 0–53)
AST: 17 U/L (ref 0–37)
Albumin: 4 g/dL (ref 3.5–5.2)
Alkaline Phosphatase: 81 U/L (ref 39–117)
BUN: 18 mg/dL (ref 6–23)
CO2: 29 mEq/L (ref 19–32)
Calcium: 9.6 mg/dL (ref 8.4–10.5)
Chloride: 104 mEq/L (ref 96–112)
Creatinine, Ser: 1.04 mg/dL (ref 0.40–1.50)
GFR: 68.01 mL/min (ref >60.00)
Glucose, Bld: 112 mg/dL — ABNORMAL HIGH (ref 70–99)
Potassium: 4.7 mEq/L (ref 3.5–5.1)
Sodium: 142 mEq/L (ref 135–145)
Total Bilirubin: 0.7 mg/dL (ref 0.2–1.2)
Total Protein: 6.7 g/dL (ref 6.0–8.3)

## 2019-03-21 LAB — URIC ACID: Uric Acid, Serum: 4.8 mg/dL (ref 4.0–7.8)

## 2019-03-21 LAB — CBC
HCT: 46.1 % (ref 39.0–52.0)
Hemoglobin: 15.4 g/dL (ref 13.0–17.0)
MCHC: 33.4 g/dL (ref 30.0–36.0)
MCV: 105.6 fl — ABNORMAL HIGH (ref 78.0–100.0)
Platelets: 203 10*3/uL (ref 150.0–400.0)
RBC: 4.37 Mil/uL (ref 4.22–5.81)
RDW: 14.9 % (ref 11.5–15.5)
WBC: 4.4 10*3/uL (ref 4.0–10.5)

## 2019-03-21 LAB — VITAMIN B12: Vitamin B-12: 206 pg/mL — ABNORMAL LOW (ref 211–911)

## 2019-03-21 MED ORDER — ALLOPURINOL 300 MG PO TABS: 300.0000 mg | ORAL_TABLET | Freq: Every day | ORAL | 3 refills | Status: DC

## 2019-03-21 MED ORDER — QUINAPRIL HCL 40 MG PO TABS: 40.0000 mg | ORAL_TABLET | Freq: Every day | ORAL | 3 refills | Status: DC

## 2019-03-21 MED ORDER — AMLODIPINE BESYLATE 10 MG PO TABS: 10.0000 mg | ORAL_TABLET | Freq: Every day | ORAL | 3 refills | Status: DC

## 2019-03-25 LAB — FOLATE RBC

## 2019-03-25 LAB — METHYLMALONIC ACID, SERUM: Methylmalonic Acid, Quant: 178 nmol/L (ref 87–318)

## 2019-03-26 ENCOUNTER — Telehealth: Payer: Self-pay | Admitting: Family Medicine

## 2019-03-26 NOTE — Telephone Encounter (Signed)
Reviewed lab results and physician's note with patient. Scheduled 1st VitB12 injection on nurse schedule for 03/27/19 at 4:30.

## 2019-03-27 ENCOUNTER — Ambulatory Visit (INDEPENDENT_AMBULATORY_CARE_PROVIDER_SITE_OTHER): Payer: PPO

## 2019-03-27 ENCOUNTER — Ambulatory Visit: Payer: PPO

## 2019-03-27 ENCOUNTER — Other Ambulatory Visit: Payer: Self-pay

## 2019-03-27 DIAGNOSIS — E538 Deficiency of other specified B group vitamins: Secondary | ICD-10-CM

## 2019-03-27 MED ORDER — CYANOCOBALAMIN 1000 MCG/ML IJ SOLN
1000.0000 ug | Freq: Once | INTRAMUSCULAR | Status: AC
  Administered 2019-03-27: 1000 ug via INTRAMUSCULAR

## 2019-03-27 NOTE — Progress Notes (Signed)
Per orders of Dr.Hunter, injection of b12 given in rt deltoid  by Sandford Craze. Patient tolerated injection well. Pt to return in a week for 2nd injection

## 2019-03-31 DIAGNOSIS — R351 Nocturia: Secondary | ICD-10-CM | POA: Diagnosis not present

## 2019-03-31 DIAGNOSIS — N200 Calculus of kidney: Secondary | ICD-10-CM | POA: Diagnosis not present

## 2019-03-31 DIAGNOSIS — N401 Enlarged prostate with lower urinary tract symptoms: Secondary | ICD-10-CM | POA: Diagnosis not present

## 2019-04-02 NOTE — Progress Notes (Signed)
I have reviewed and agree with note, evaluation, plan.   Nicolaus Andel, MD  

## 2019-04-03 ENCOUNTER — Other Ambulatory Visit: Payer: Self-pay

## 2019-04-03 ENCOUNTER — Ambulatory Visit (INDEPENDENT_AMBULATORY_CARE_PROVIDER_SITE_OTHER): Payer: PPO

## 2019-04-03 DIAGNOSIS — E538 Deficiency of other specified B group vitamins: Secondary | ICD-10-CM | POA: Diagnosis not present

## 2019-04-03 MED ORDER — CYANOCOBALAMIN 1000 MCG/ML IJ SOLN
1000.0000 ug | Freq: Once | INTRAMUSCULAR | Status: AC
  Administered 2019-04-03: 1000 ug via INTRAMUSCULAR

## 2019-04-03 NOTE — Progress Notes (Signed)
Per orders of Dr. Jerline Pain, injection of B-12 given by Francella Solian in left deltoid. Patient tolerated injection well. Patient will make appointment for 1week. Patient will make appointment at check out.

## 2019-04-10 ENCOUNTER — Ambulatory Visit (INDEPENDENT_AMBULATORY_CARE_PROVIDER_SITE_OTHER): Payer: PPO

## 2019-04-10 ENCOUNTER — Other Ambulatory Visit: Payer: Self-pay

## 2019-04-10 DIAGNOSIS — E538 Deficiency of other specified B group vitamins: Secondary | ICD-10-CM

## 2019-04-10 MED ORDER — CYANOCOBALAMIN 1000 MCG/ML IJ SOLN
1000.0000 ug | Freq: Once | INTRAMUSCULAR | Status: AC
  Administered 2019-04-10: 1000 ug via INTRAMUSCULAR

## 2019-04-10 NOTE — Progress Notes (Signed)
Per orders of Dr. Hunter, injection of B-12 given by Demiya Magno Y Kosha Jaquith in right deltoid. Patient tolerated injection well. Patient will make appointment for 1week  

## 2019-04-14 NOTE — Progress Notes (Signed)
I have reviewed and agree with note, evaluation, plan.   Stephen Hunter, MD  

## 2019-04-17 ENCOUNTER — Ambulatory Visit (INDEPENDENT_AMBULATORY_CARE_PROVIDER_SITE_OTHER): Payer: PPO

## 2019-04-17 ENCOUNTER — Other Ambulatory Visit: Payer: Self-pay

## 2019-04-17 DIAGNOSIS — E538 Deficiency of other specified B group vitamins: Secondary | ICD-10-CM | POA: Diagnosis not present

## 2019-04-17 MED ORDER — CYANOCOBALAMIN 1000 MCG/ML IJ SOLN
1000.0000 ug | Freq: Once | INTRAMUSCULAR | Status: AC
  Administered 2019-04-17: 1000 ug via INTRAMUSCULAR

## 2019-04-17 NOTE — Progress Notes (Signed)
Per orders of Dr. Hunter, injection of vitamin B12 1000 mcg given in right deltoid by Philippa Vessey, CMA.  Patient tolerated injection well.  

## 2019-04-21 DIAGNOSIS — D229 Melanocytic nevi, unspecified: Secondary | ICD-10-CM | POA: Diagnosis not present

## 2019-04-21 DIAGNOSIS — L309 Dermatitis, unspecified: Secondary | ICD-10-CM | POA: Diagnosis not present

## 2019-05-20 ENCOUNTER — Other Ambulatory Visit: Payer: Self-pay

## 2019-05-20 ENCOUNTER — Ambulatory Visit (INDEPENDENT_AMBULATORY_CARE_PROVIDER_SITE_OTHER): Payer: PPO

## 2019-05-20 DIAGNOSIS — E538 Deficiency of other specified B group vitamins: Secondary | ICD-10-CM | POA: Diagnosis not present

## 2019-05-20 MED ORDER — CYANOCOBALAMIN 1000 MCG/ML IJ SOLN
1000.0000 ug | Freq: Once | INTRAMUSCULAR | Status: AC
  Administered 2019-05-20: 11:00:00 1000 ug via INTRAMUSCULAR

## 2019-05-20 NOTE — Progress Notes (Signed)
Per orders of Dr. Yong Channel, injection of B12 given by Serita Sheller in left deltoid. Patient tolerated injection well. Patient will make appointment for 1 week.

## 2019-06-18 ENCOUNTER — Other Ambulatory Visit: Payer: Self-pay

## 2019-06-18 ENCOUNTER — Ambulatory Visit (INDEPENDENT_AMBULATORY_CARE_PROVIDER_SITE_OTHER): Payer: PPO

## 2019-06-18 DIAGNOSIS — E538 Deficiency of other specified B group vitamins: Secondary | ICD-10-CM

## 2019-06-18 MED ORDER — CYANOCOBALAMIN 1000 MCG/ML IJ SOLN
1000.0000 ug | Freq: Once | INTRAMUSCULAR | Status: AC
  Administered 2019-06-18: 1000 ug via INTRAMUSCULAR

## 2019-06-18 NOTE — Progress Notes (Signed)
Per orders of Samantha Worley, PA, injection of B 12 given by Itzel Mckibbin Y [place] Delauder in right deltoid. Patient tolerated injection well. Patient will make appointment for 1month   

## 2019-06-26 ENCOUNTER — Other Ambulatory Visit: Payer: Self-pay | Admitting: Family Medicine

## 2019-07-07 ENCOUNTER — Telehealth: Payer: Self-pay | Admitting: Family Medicine

## 2019-07-07 NOTE — Telephone Encounter (Signed)
Patients wife, [name] called in stating they are getting the covid vaccine on Wednesday and she was wanting to make sure it was safe for him to get considering the open heart surgery he had recently. She would also like to know if it is safe for her as well and would like a returned phone call at someone's earliest convinces.

## 2019-07-08 NOTE — Telephone Encounter (Signed)
There are certainly potential side effects but I think the benefit of the vaccine outweighs the risks at this point especially at his age bracket.  As long as neither has a history of anaphylaxis I am okay with them proceeding forward

## 2019-07-08 NOTE — Telephone Encounter (Signed)
Please advise 

## 2019-07-09 ENCOUNTER — Ambulatory Visit: Payer: PPO | Attending: Internal Medicine

## 2019-07-09 DIAGNOSIS — Z23 Encounter for immunization: Secondary | ICD-10-CM

## 2019-07-09 NOTE — Progress Notes (Signed)
   Covid-19 Vaccination Clinic  Name:  Barry Decker    MRN: [id number] DOB: Jan 06, 1933  07/09/2019  [name] was observed post Covid-19 immunization for 15 minutes without incidence. He was provided with Vaccine Information Sheet and instruction to access the V-Safe system.   [name] was instructed to call 911 with any severe reactions post vaccine: Marland Kitchen Difficulty breathing  . Swelling of your face and throat  . A fast heartbeat  . A bad rash all over your body  . Dizziness and weakness    Immunizations Administered    Name Date Dose VIS Date Route   Pfizer COVID-19 Vaccine 07/09/2019  3:22 PM 0.3 mL 05/30/2019 Intramuscular   Manufacturer: [place]   Lot: [id number]   [place]: [id number]

## 2019-07-10 NOTE — Telephone Encounter (Signed)
Called and lm from below.

## 2019-07-18 ENCOUNTER — Telehealth: Payer: Self-pay | Admitting: Family Medicine

## 2019-07-18 DIAGNOSIS — Z961 Presence of intraocular lens: Secondary | ICD-10-CM | POA: Diagnosis not present

## 2019-07-18 DIAGNOSIS — H26491 Other secondary cataract, right eye: Secondary | ICD-10-CM | POA: Diagnosis not present

## 2019-07-18 DIAGNOSIS — I10 Essential (primary) hypertension: Secondary | ICD-10-CM | POA: Diagnosis not present

## 2019-07-18 NOTE — Telephone Encounter (Signed)
I left a message asking the patient and spouse to call and schedule Medicare AWV with Loma Sousa ([place]).  If patient calls back, please schedule Medicare Wellness Visit at next available opening.  VDM ([place])

## 2019-07-21 ENCOUNTER — Other Ambulatory Visit: Payer: Self-pay

## 2019-07-22 ENCOUNTER — Ambulatory Visit (INDEPENDENT_AMBULATORY_CARE_PROVIDER_SITE_OTHER): Payer: PPO

## 2019-07-22 DIAGNOSIS — E538 Deficiency of other specified B group vitamins: Secondary | ICD-10-CM | POA: Diagnosis not present

## 2019-07-22 MED ORDER — CYANOCOBALAMIN 1000 MCG/ML IJ SOLN
1000.0000 ug | Freq: Once | INTRAMUSCULAR | Status: AC
  Administered 2019-07-22: 1000 ug via INTRAMUSCULAR

## 2019-07-22 NOTE — Progress Notes (Signed)
Per orders of Dr. Hunter, injection of B-12 given by Melitta D Caldwell in left deltoid. Patient tolerated injection well. Patient will make appointment for 1 month.  

## 2019-07-28 ENCOUNTER — Ambulatory Visit: Payer: PPO | Attending: Internal Medicine

## 2019-07-28 DIAGNOSIS — Z23 Encounter for immunization: Secondary | ICD-10-CM | POA: Insufficient documentation

## 2019-07-28 NOTE — Progress Notes (Signed)
   Covid-19 Vaccination Clinic  Name:  Barry Decker    MRN: [id number] DOB: 10-15-1932  07/28/2019  Barry Decker was observed post Covid-19 immunization for 15 minutes without incidence. He was provided with Vaccine Information Sheet and instruction to access the V-Safe system.   Barry Decker was instructed to call 911 with any severe reactions post vaccine: Marland Kitchen Difficulty breathing  . Swelling of your face and throat  . A fast heartbeat  . A bad rash all over your body  . Dizziness and weakness    Immunizations Administered    Name Date Dose VIS Date Route   Pfizer COVID-19 Vaccine 07/28/2019 12:13 PM 0.3 mL 05/30/2019 Intramuscular   Manufacturer: [place]   Lot: [id number]   [place]: [id number]

## 2019-07-30 NOTE — Telephone Encounter (Signed)
Patient called back and declined at this time.

## 2019-08-19 ENCOUNTER — Other Ambulatory Visit: Payer: Self-pay

## 2019-08-19 ENCOUNTER — Ambulatory Visit: Payer: PPO

## 2019-08-19 DIAGNOSIS — E538 Deficiency of other specified B group vitamins: Secondary | ICD-10-CM

## 2019-08-19 MED ORDER — CYANOCOBALAMIN 1000 MCG/ML IJ SOLN: 1000.0000 ug | Freq: Once | INTRAMUSCULAR | Status: DC

## 2019-08-19 NOTE — Progress Notes (Signed)
Per orders of Dr. Hunter, injection of Vitamin B12 given by Lash Matulich D Kirbie Stodghill in right deltoid. Patient tolerated injection well. Patient will make appointment for 1 month.   

## 2019-08-25 ENCOUNTER — Other Ambulatory Visit: Payer: Self-pay

## 2019-08-25 MED ORDER — QUINAPRIL HCL 40 MG PO TABS: 40.0000 mg | ORAL_TABLET | Freq: Every day | ORAL | 3 refills | Status: DC

## 2019-09-23 ENCOUNTER — Encounter: Payer: Self-pay | Admitting: Family Medicine

## 2019-09-23 ENCOUNTER — Ambulatory Visit (INDEPENDENT_AMBULATORY_CARE_PROVIDER_SITE_OTHER): Payer: PPO | Admitting: Family Medicine

## 2019-09-23 ENCOUNTER — Other Ambulatory Visit: Payer: Self-pay

## 2019-09-23 VITALS — BP 118/66 | HR 65 | Temp 97.8°F | Ht 72.0 in | Wt 238.8 lb

## 2019-09-23 DIAGNOSIS — I5032 Chronic diastolic (congestive) heart failure: Secondary | ICD-10-CM | POA: Diagnosis not present

## 2019-09-23 DIAGNOSIS — M1A09X0 Idiopathic chronic gout, multiple sites, without tophus (tophi): Secondary | ICD-10-CM | POA: Diagnosis not present

## 2019-09-23 DIAGNOSIS — I77811 Abdominal aortic ectasia: Secondary | ICD-10-CM

## 2019-09-23 DIAGNOSIS — I10 Essential (primary) hypertension: Secondary | ICD-10-CM | POA: Diagnosis not present

## 2019-09-23 DIAGNOSIS — E538 Deficiency of other specified B group vitamins: Secondary | ICD-10-CM

## 2019-09-23 DIAGNOSIS — Z953 Presence of xenogenic heart valve: Secondary | ICD-10-CM | POA: Diagnosis not present

## 2019-09-23 DIAGNOSIS — E7800 Pure hypercholesterolemia, unspecified: Secondary | ICD-10-CM

## 2019-09-23 LAB — COMPREHENSIVE METABOLIC PANEL
ALT: 15 U/L (ref 0–53)
AST: 17 U/L (ref 0–37)
Albumin: 4 g/dL (ref 3.5–5.2)
Alkaline Phosphatase: 78 U/L (ref 39–117)
BUN: 17 mg/dL (ref 6–23)
CO2: 28 mEq/L (ref 19–32)
Calcium: 9.6 mg/dL (ref 8.4–10.5)
Chloride: 102 mEq/L (ref 96–112)
Creatinine, Ser: 1.04 mg/dL (ref 0.40–1.50)
GFR: 67.93 mL/min (ref >60.00)
Glucose, Bld: 97 mg/dL (ref 70–99)
Potassium: 4.8 mEq/L (ref 3.5–5.1)
Sodium: 139 mEq/L (ref 135–145)
Total Bilirubin: 0.7 mg/dL (ref 0.2–1.2)
Total Protein: 6.7 g/dL (ref 6.0–8.3)

## 2019-09-23 LAB — CBC WITH DIFFERENTIAL/PLATELET
Basophils Absolute: 0 10*3/uL (ref 0.0–0.1)
Basophils Relative: 0.6 % (ref 0.0–3.0)
Eosinophils Absolute: 0.2 10*3/uL (ref 0.0–0.7)
Eosinophils Relative: 3.7 % (ref 0.0–5.0)
HCT: 43.8 % (ref 39.0–52.0)
Hemoglobin: 14.9 g/dL (ref 13.0–17.0)
Lymphocytes Relative: 24.7 % (ref 12.0–46.0)
Lymphs Abs: 1.1 10*3/uL (ref 0.7–4.0)
MCHC: 34.1 g/dL (ref 30.0–36.0)
MCV: 105.9 fl — ABNORMAL HIGH (ref 78.0–100.0)
Monocytes Absolute: 0.5 10*3/uL (ref 0.1–1.0)
Monocytes Relative: 11.6 % (ref 3.0–12.0)
Neutro Abs: 2.7 10*3/uL (ref 1.4–7.7)
Neutrophils Relative %: 59.4 % (ref 43.0–77.0)
Platelets: 198 10*3/uL (ref 150.0–400.0)
RBC: 4.14 Mil/uL — ABNORMAL LOW (ref 4.22–5.81)
RDW: 14.9 % (ref 11.5–15.5)
WBC: 4.6 10*3/uL (ref 4.0–10.5)

## 2019-09-23 LAB — VITAMIN B12: Vitamin B-12: 331 pg/mL (ref 211–911)

## 2019-09-23 NOTE — Patient Instructions (Addendum)
Please stop by lab before you go If you do not have mychart- we will call you about results within 5 business days of [place] receiving them.  If you have mychart- we will send your results within 3 business days of [place] receiving them.  If abnormal or we want to clarify a result, we will call or mychart you to make sure you receive the message.  If you have questions or concerns or don't hear within 5 business days, please send [place] a message or call us.   Check out nutritionfacts.org and search under high cholesterol- some dietary ideas to help with #s  No changes in medicine today- hoping b12 and large cell size/mcv is improving  Recommended follow up: Return in about 6 months (around 03/24/2020) for physical or sooner if needed.

## 2019-09-23 NOTE — Progress Notes (Signed)
Phone [phone] In person visit   Subjective:   Barry Decker is a 86 y.o. year old very pleasant male patient who presents for/with See problem oriented charting Chief Complaint  Patient presents with  . Follow-up    This visit occurred during the SARS-CoV-2 public health emergency.  Safety protocols were in place, including screening questions prior to the visit, additional usage of staff PPE, and extensive cleaning of exam room while observing appropriate contact time as indicated for disinfecting solutions.   Past Medical History-  Patient Active Problem List   Diagnosis Date Noted  . 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 Chronic diastolic CHF (congestive heart failure) ([place]) 03/30/2017    Priority: Medium  . Abdominal aortic ectasia ([place]) 04/07/2016    Priority: Medium  . HYPERCHOLESTEROLEMIA WITH HIGH HDL 01/31/2007    Priority: Medium  . Gout 01/31/2007    Priority: Medium  . Essential hypertension 01/31/2007    Priority: Medium  . History of malignant neoplasm of large intestine 01/31/2007    Priority: Medium  . Osteoarthritis of cervical spine 08/31/2015    Priority: Low  . Osteoarthritis of left knee 05/25/2014    Priority: Low  . B12 deficiency 09/23/2019  . Macrocytic anemia 03/21/2019  . Kidney stones   . Throat pain in adult 08/31/2015    Medications- reviewed and updated Current Outpatient Medications  Medication Sig Dispense Refill  . allopurinol (ZYLOPRIM) 300 MG tablet Take 1 tablet (300 mg total) by mouth daily. 90 tablet 3  . amLODipine (NORVASC) 10 MG tablet Take 1 tablet (10 mg total) by mouth daily. 90 tablet 3  . amoxicillin (AMOXIL) 500 MG tablet Take 4 tablets (2,000 mg total) by mouth as directed. 1 hour prior to dental work including cleanings 12 tablet 12  . aspirin EC 81 MG EC tablet Take 1 tablet (81 mg total) by mouth daily.    . furosemide (LASIX) 20 MG tablet  TAKE 1 TABLET BY MOUTH EVERY DAY 90 tablet 3  . IRON PO Take 65 mg/day by mouth daily.     . potassium citrate (UROCIT-K) 10 MEQ (1080 MG) SR tablet Take 1 tablet (10 mEq total) by mouth 3 (three) times daily with meals. 300 tablet 1  . quinapril (ACCUPRIL) 40 MG tablet Take 1 tablet (40 mg total) by mouth daily. 90 tablet 3   Current Facility-Administered Medications  Medication Dose Route Frequency Provider Last Rate Last Admin  . cyanocobalamin ((VITAMIN B-12)) injection 1,000 mcg  1,000 mcg Intramuscular Once [name], MD         Objective:  BP 118/66   Pulse 65   Temp 97.8 F (36.6 C) (Temporal)   Ht 6' (1.829 m)   Wt 238 lb 12.8 oz (108.3 kg)   SpO2 94%   BMI 32.39 kg/m  Gen: NAD, resting comfortably CV: RRR systolic murmur noted stable Lungs: CTAB no crackles, wheeze, rhonchi Ext: no edema Skin: warm, dry    Assessment and Plan   #beach trip with daughter in [place] next month. Also hoping to see son once he gets vaccinated  # with being so isolated over last year/less active- had some muscle atrophy- he is planning to get more active to try to counteract that  # close follow up with cardiology for TAVR with history severe aortic stenosis  # Diastolic CHF S:compliant with lasix 20mg  . Denies swelling in legs or weight gain A/P:  Stable. Continue current medications.   #hypertension S: compliant with accupril 40mg , amlodipine 10mg , lasix 20mg  BP Readings from Last 3 Encounters:  09/23/19 118/66  03/21/19 138/64  01/23/19 (!) 142/70  A/P: Stable. Continue current medications.   #Gout S: 0 flares in last 6 months on alopurinol 300mg  Lab Results  Component Value Date   LABURIC 4.8 03/21/2019  A/P:well controlled on last check- will continue current meds and update uric acid next visit   #hyperlipidemia S: mild poor control on no rx Lab Results  Component Value Date   CHOL 199 03/21/2019   HDL 60.00 03/21/2019   LDLCALC 109 (H) 03/21/2019    LDLDIRECT 136.2 10/25/2012   TRIG 149.0 03/21/2019   CHOLHDL 3 03/21/2019   A/P: this is only for primary prevention so he wants to hold off on medicine- plans to work on healthy eating/regular exercise/smaller portions  # macrocytic anemia/b12 deficiency S:coming for monthly shots with low b12-  A/P: hopefully improving- check cbc and b12 today   #abdominal aortic ectasia- due for repeat ultrasound 2022- we will plan on this. Continue to control risk factors as above  Recommended follow up: Return in about 6 months (around 03/24/2020) for physical or sooner if needed.  Lab/Order associations:   ICD-10-CM   1. Essential hypertension  I10 CBC with Differential/Platelet    Comprehensive metabolic panel  2. Chronic diastolic CHF (congestive heart failure) ([facility])  I50.32   3. Idiopathic chronic gout of multiple sites without tophus  M1A.09X0   4. HYPERCHOLESTEROLEMIA WITH HIGH HDL  E78.00   5. B12 deficiency  E53.8 Vitamin B12  6. Abdominal aortic ectasia ([facility]) Chronic I77.811   7. 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  Garret Reddish, MD

## 2019-09-30 ENCOUNTER — Ambulatory Visit (INDEPENDENT_AMBULATORY_CARE_PROVIDER_SITE_OTHER): Payer: PPO

## 2019-09-30 ENCOUNTER — Other Ambulatory Visit: Payer: Self-pay

## 2019-09-30 DIAGNOSIS — E538 Deficiency of other specified B group vitamins: Secondary | ICD-10-CM

## 2019-09-30 MED ORDER — CYANOCOBALAMIN 1000 MCG/ML IJ SOLN
1000.0000 ug | Freq: Once | INTRAMUSCULAR | Status: AC
  Administered 2019-09-30: 1000 ug via INTRAMUSCULAR

## 2019-09-30 NOTE — Progress Notes (Signed)
Barry Decker 86 y.o. male presents to office today for monthly B12 injections per Garret Reddish, MD. Administered CYANOCOBALAMIN 1,000 mcg IM right arm. Patient tolerated well. Aware to return next month for next injection.

## 2019-09-30 NOTE — Progress Notes (Signed)
I have reviewed and agree with note, evaluation, plan.   Shermaine Brigham, MD  

## 2019-10-30 ENCOUNTER — Other Ambulatory Visit: Payer: Self-pay

## 2019-10-30 ENCOUNTER — Ambulatory Visit (INDEPENDENT_AMBULATORY_CARE_PROVIDER_SITE_OTHER): Payer: PPO

## 2019-10-30 DIAGNOSIS — E538 Deficiency of other specified B group vitamins: Secondary | ICD-10-CM

## 2019-10-30 MED ORDER — CYANOCOBALAMIN 1000 MCG/ML IJ SOLN
1000.0000 ug | Freq: Once | INTRAMUSCULAR | Status: AC
  Administered 2019-10-30: 1000 ug via INTRAMUSCULAR

## 2019-10-30 NOTE — Progress Notes (Signed)
I have reviewed and agree with note, evaluation, plan.   Anastacia Reinecke, MD  

## 2019-10-30 NOTE — Progress Notes (Signed)
Patient came into the office to receive his Vitamin B12 injection.

## 2019-11-13 ENCOUNTER — Other Ambulatory Visit: Payer: Self-pay | Admitting: Cardiovascular Disease

## 2019-12-02 ENCOUNTER — Other Ambulatory Visit: Payer: Self-pay

## 2019-12-02 ENCOUNTER — Ambulatory Visit (INDEPENDENT_AMBULATORY_CARE_PROVIDER_SITE_OTHER): Payer: PPO

## 2019-12-02 ENCOUNTER — Ambulatory Visit: Payer: PPO

## 2019-12-02 DIAGNOSIS — E538 Deficiency of other specified B group vitamins: Secondary | ICD-10-CM | POA: Diagnosis not present

## 2019-12-02 MED ORDER — CYANOCOBALAMIN 1000 MCG/ML IJ SOLN
1000.0000 ug | Freq: Once | INTRAMUSCULAR | Status: AC
  Administered 2019-12-02: 1000 ug via INTRAMUSCULAR

## 2019-12-02 NOTE — Progress Notes (Signed)
Per orders of Dr.Hunter injection of Vitamin B12 given by Loralyn Freshwater. Patient tolerated injection well.

## 2020-01-01 ENCOUNTER — Other Ambulatory Visit: Payer: Self-pay

## 2020-01-01 ENCOUNTER — Ambulatory Visit (INDEPENDENT_AMBULATORY_CARE_PROVIDER_SITE_OTHER): Payer: PPO

## 2020-01-01 DIAGNOSIS — E538 Deficiency of other specified B group vitamins: Secondary | ICD-10-CM | POA: Diagnosis not present

## 2020-01-01 MED ORDER — CYANOCOBALAMIN 1000 MCG/ML IJ SOLN
1000.0000 ug | Freq: Once | INTRAMUSCULAR | Status: AC
  Administered 2020-01-01: 1000 ug via INTRAMUSCULAR

## 2020-01-01 NOTE — Progress Notes (Signed)
Per orders of Dr. Hunter, injection of Vitamin B12 given by Sihaam Chrobak. Patient tolerated injection well.  

## 2020-01-13 ENCOUNTER — Other Ambulatory Visit: Payer: Self-pay

## 2020-02-03 ENCOUNTER — Other Ambulatory Visit: Payer: Self-pay

## 2020-02-03 ENCOUNTER — Ambulatory Visit (INDEPENDENT_AMBULATORY_CARE_PROVIDER_SITE_OTHER): Payer: PPO

## 2020-02-03 DIAGNOSIS — E538 Deficiency of other specified B group vitamins: Secondary | ICD-10-CM | POA: Diagnosis not present

## 2020-02-03 MED ORDER — CYANOCOBALAMIN 1000 MCG/ML IJ SOLN
1000.0000 ug | Freq: Once | INTRAMUSCULAR | Status: AC
  Administered 2020-02-03: 1000 ug via INTRAMUSCULAR

## 2020-02-03 NOTE — Progress Notes (Signed)
Per orders of Dr. Yong Channel, injection of Vitamin B12 given by Tobe Sos in left deltoid. Patient tolerated injection well. Patient will make appointment for 1 month.

## 2020-02-08 ENCOUNTER — Other Ambulatory Visit: Payer: Self-pay | Admitting: Cardiovascular Disease

## 2020-02-18 ENCOUNTER — Other Ambulatory Visit: Payer: Self-pay | Admitting: Cardiovascular Disease

## 2020-03-01 ENCOUNTER — Other Ambulatory Visit: Payer: Self-pay | Admitting: Cardiovascular Disease

## 2020-03-04 ENCOUNTER — Ambulatory Visit: Payer: PPO

## 2020-03-09 ENCOUNTER — Ambulatory Visit (INDEPENDENT_AMBULATORY_CARE_PROVIDER_SITE_OTHER): Payer: PPO

## 2020-03-09 ENCOUNTER — Other Ambulatory Visit: Payer: Self-pay

## 2020-03-09 DIAGNOSIS — E538 Deficiency of other specified B group vitamins: Secondary | ICD-10-CM | POA: Diagnosis not present

## 2020-03-09 MED ORDER — CYANOCOBALAMIN 1000 MCG/ML IJ SOLN
1000.0000 ug | Freq: Once | INTRAMUSCULAR | Status: AC
  Administered 2020-03-09: 1000 ug via INTRAMUSCULAR

## 2020-03-09 NOTE — Progress Notes (Signed)
I have reviewed and agree with note, evaluation, plan.   Shigeko Manard, MD  

## 2020-03-09 NOTE — Progress Notes (Signed)
[name] 86 y.o. male presents to office today for monthly B12 injection per Garret Reddish, MD. Administered CYANOCOBALAMIN 1,000 mg/mL IM left arm. Patient tolerated well.

## 2020-03-15 DIAGNOSIS — I10 Essential (primary) hypertension: Secondary | ICD-10-CM | POA: Diagnosis not present

## 2020-03-15 DIAGNOSIS — H26491 Other secondary cataract, right eye: Secondary | ICD-10-CM | POA: Diagnosis not present

## 2020-03-15 DIAGNOSIS — Z961 Presence of intraocular lens: Secondary | ICD-10-CM | POA: Diagnosis not present

## 2020-03-17 ENCOUNTER — Other Ambulatory Visit: Payer: Self-pay | Admitting: Family Medicine

## 2020-03-24 NOTE — Patient Instructions (Addendum)
We will call you within two weeks about your referral to cardiology. If you do not hear within 3 weeks, give [place] a call.   If b12 looks good over 400 may try b12 over the counter 1031mcg every day instead of injections   Please stop by lab before you go If you have mychart- we will send your results within 3 business days of [place] receiving them.  If you do not have mychart- we will call you about results within 5 business days of [place] receiving them.  *please note we are currently using [facility] which has a longer processing time than [place] typically so labs may not come back as quickly as in the past *please also note that you will see labs on mychart as soon as they post. I will later go in and write notes on them- will say "notes from Dr. Yong Channel"

## 2020-03-24 NOTE — Progress Notes (Signed)
Phone: [phone]   Subjective:  Patient presents today for their annual physical. Chief complaint-noted.   See problem oriented charting- ROS- full  review of systems was completed and negative  except for: stable tinnitus, back arthritis, neck arthritis  The following were reviewed and entered/updated in epic: Past Medical History:  Diagnosis Date  . Aortic valve disease    s/p TVAR 09/25/17  . Arthritis    "spinal" (11/16/2015)  . Atypical nevus 05/14/2013   moderate atypia - left scapula  . Colon cancer ([place])    precancer-partial colectomy  . Dilated aortic root ([place])   . Elevated HDL   . First degree AV block   . Gout   . Heart murmur   . History of kidney stones   . Hypertension   . Squamous cell skin cancer 08/04/2011   Left outer brow - CX3 + 5FU   Patient Active Problem List   Diagnosis Date Noted  . Ascending aortic aneurysm ([place]) 03/26/2020    Priority: High  . 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 Aortic atherosclerosis ([place]) 03/26/2020    Priority: Medium  . B12 deficiency 09/23/2019    Priority: Medium  . Chronic diastolic CHF (congestive heart failure) ([place]) 03/30/2017    Priority: Medium  . Abdominal aortic ectasia ([place]) 04/07/2016    Priority: Medium  . HYPERCHOLESTEROLEMIA WITH HIGH HDL 01/31/2007    Priority: Medium  . Gout 01/31/2007    Priority: Medium  . Essential hypertension 01/31/2007    Priority: Medium  . History of malignant neoplasm of large intestine 01/31/2007    Priority: Medium  . Osteoarthritis of cervical spine 08/31/2015    Priority: Low  . Osteoarthritis of left knee 05/25/2014    Priority: Low  . Macrocytic anemia 03/21/2019  . Kidney stones   . Throat pain in adult 08/31/2015   Past Surgical History:  Procedure Laterality Date  . BACK SURGERY    . CATARACT EXTRACTION W/ INTRAOCULAR LENS  IMPLANT, BILATERAL Bilateral 2017  . COLONOSCOPY W/  POLYPECTOMY    . FACET JOINT INJECTION  2009  . LITHOTRIPSY  2010  . LUMBAR LAMINECTOMY  ~ 1992  . PARTIAL COLECTOMY  1997   precancerours  . PARTIAL KNEE ARTHROPLASTY Left 11/16/2015   Procedure: LEFT KNEE UNICOMPARTMENTAL ;  Surgeon: [name], MD;  Location: [facility];  Service: Orthopedics;  Laterality: Left;  . REPLACEMENT UNICONDYLAR JOINT KNEE Left 11/16/2015  . RIGHT/LEFT HEART CATH AND CORONARY ANGIOGRAPHY N/A 08/10/2017   Procedure: RIGHT/LEFT HEART CATH AND CORONARY ANGIOGRAPHY;  Surgeon: [name], MD;  Location: [facility];  Service: Cardiovascular;  Laterality: N/A;  . TEE WITHOUT CARDIOVERSION N/A 09/25/2017   Procedure: TRANSESOPHAGEAL ECHOCARDIOGRAM (TEE);  Surgeon: [name], MD;  Location: [facility];  Service: Open Heart Surgery;  Laterality: N/A;  . TONSILLECTOMY    . TRANSCATHETER AORTIC VALVE REPLACEMENT, TRANSFEMORAL N/A 09/25/2017   Procedure: TRANSCATHETER AORTIC VALVE REPLACEMENT, TRANSFEMORAL;  Surgeon: [name], MD;  Location: [facility];  Service: Open Heart Surgery;  Laterality: N/A;  . TYMPANOPLASTY Left 1998   ruptured ear drum with skin graft    Family History  Problem Relation Age of Onset  . Heart attack Mother 64       former smoker  . Heart attack Father 3       former smoker  . Hypertension Sister   . Heart attack Brother 21  Rheumatic fever    Medications- reviewed and updated Current Outpatient Medications  Medication Sig Dispense Refill  . allopurinol (ZYLOPRIM) 300 MG tablet TAKE 1 TABLET BY MOUTH EVERY DAY 90 tablet 3  . amLODipine (NORVASC) 10 MG tablet TAKE 1 TABLET BY MOUTH EVERY DAY 90 tablet 3  . amoxicillin (AMOXIL) 500 MG tablet Take 4 tablets (2,000 mg total) by mouth as directed. 1 hour prior to dental work including cleanings 12 tablet 12  . aspirin EC 81 MG EC tablet Take 1 tablet (81 mg total) by mouth daily.    . furosemide (LASIX) 20 MG tablet TAKE 1 TABLET DAILY. MAKE OV APPT  WITH DR Angelena Form BEFORE MORE REFILLS 15 tablet 0  . IRON PO Take 65 mg/day by mouth daily.     . potassium citrate (UROCIT-K) 10 MEQ (1080 MG) SR tablet Take 1 tablet (10 mEq total) by mouth 3 (three) times daily with meals. 300 tablet 1  . quinapril (ACCUPRIL) 40 MG tablet Take 1 tablet (40 mg total) by mouth daily. 90 tablet 3   No current facility-administered medications for this visit.    Allergies-reviewed and updated No Known Allergies  Social History   Social History Narrative   Married. 5 children. 4 grandkids. 1 greatgrandkid.       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       Hobbies: woodworking      Advised to consider advanced directives/hcpoa-thinks wife may have 70 from years ago.   Objective  Objective:  BP 124/80   Pulse 61   Temp 98 F (36.7 C) (Temporal)   Resp 18   Ht 6' (1.829 m)   Wt 226 lb 6.4 oz (102.7 kg)   SpO2 95%   BMI 30.71 kg/m  Gen: NAD, resting comfortably HEENT: Mucous membranes are moist. Oropharynx normal Neck: no thyromegaly CV: RRR no murmurs rubs or gallops Lungs: CTAB no crackles, wheeze, rhonchi Abdomen: soft/nontender/nondistended/normal bowel sounds. No rebound or guarding.  Ext: trace edema Skin: warm, dry Neuro: grossly normal, moves all extremities, PERRLA    Assessment and Plan  86 y.o. male presenting for annual physical.  Health Maintenance counseling: 1. Anticipatory guidance: Patient counseled regarding regular dental exams q6 months, eye exams yearly,  avoiding smoking and second hand smoke, limiting alcohol to 2 beverages per day .   2. Risk factor reduction:  Advised patient of need for regular exercise and diet rich and fruits and vegetables to reduce risk of heart attack and stroke. Exercise- prior walking 1/2 mile every other day- with weather getting better he will restart. Diet-congratulated on another 12 lbs weigh tloss! Has been working on diet. 20 lbs down in the year- states stayed  away from bad foods and portion controlled other foods Wt Readings from Last 3 Encounters:  03/26/20 226 lb 6.4 oz (102.7 kg)  09/23/19 238 lb 12.8 oz (108.3 kg)  03/21/19 246 lb (111.6 kg)  3. Immunizations/screenings/ancillary studies- high dose flu shot today . covid 19 booster  Immunization History  Administered Date(s) Administered  . Fluad Quad(high Dose 65+) 03/05/2020  . Influenza, High Dose Seasonal PF 03/26/2014, 04/08/2017, 03/20/2018, 02/03/2019, 03/04/2019  . Influenza,inj,Quad PF,6+ Mos 03/14/2013  . Influenza-Unspecified 04/06/2015, 03/02/2016, 02/28/2017, 03/20/2018  . PFIZER SARS-COV-2 Vaccination 07/09/2019, 07/28/2019, 03/17/2020  . Pneumococcal Conjugate-13 05/25/2014  . Pneumococcal Polysaccharide-23 06/19/2004  . Td 12/27/2007  . Tdap 05/22/2018  . Zoster 06/19/2008  . Zoster Recombinat (Shingrix) 06/19/2018, 12/26/2018   4. Prostate cancer screening-stable nocturia twice a  night.  Past age based screening recommendations.  Follows with Dr. Jeffie Pollock for kidney stones  Lab Results  Component Value Date   PSA 1.36 10/04/2009   PSA 1.19 12/27/2007   PSA 0.80 06/22/2006   5. Colon cancer screening - past age based screening recommendations.  Partial colectomy for precancerous polyps around 2011 and was told no more colonoscopies see letter from Dr. Loletha Carrow August 17, 2016 6. Skin cancer screening-follows with Dr. Denna Haggard. advised regular sunscreen use. Denies worrisome, changing, or new skin lesions.  7.  Former smoker-quit in 1980s.  Urine is followed by urology 8. STD screening - opts out as monogamous  Status of chronic or acute concerns   #Status post TAVR with bioprosthesis follows with Dr. Angelena Form for severe aortic stenosis-continues to do well.  Aortic stenosis was related to prior rheumatic fever.  Dental prophylaxis have been recommended by cardiology.  Continue cardiology follow-up -also with aortic aneurysm ascending which has bene stable- we looked back and  last visit I could see was 01/23/2019- referred back to cardiology today  #hyperlipidemia/aortic atherosclerosis S: Medication:none  Lab Results  Component Value Date   CHOL 199 03/21/2019   HDL 60.00 03/21/2019   LDLCALC 109 (H) 03/21/2019   LDLDIRECT 136.2 10/25/2012   TRIG 149.0 03/21/2019   CHOLHDL 3 03/21/2019   A/P: update lipids today- if LDL above 70 - perhaps consider once a week cholesterol medicine in lgiht of aortic atherosclerosis (just added to problem list today as noted when looking back at aortic aneurysm follow up)   # B12 deficiency S: Current treatment/medication (oral vs. IM): b12 injection 1018mcg Lab Results  Component Value Date   VITAMINB12 331 09/23/2019  A/P: improving last visit- hoping for at least over 400. If over 400 he would like to try oral and we can recheck next visit   #Gout S: 0 flares in 6 months on Allopurinol 300Mg  Lab Results  Component Value Date   LABURIC 4.8 03/21/2019  A/P: Uric acid well-controlled on last labs-date with labs today  #Chronic diastolic CHF-stable baseline shortness of breath #hypertension S: medication: Quinapril 40MG , amlodipine 10Mg , Lasix 20 mg  No increased edema or SOB- actually has lost weight BP Readings from Last 3 Encounters:  03/26/20 124/80  09/23/19 118/66  03/21/19 138/64  A/P: hypertension controlled. CHF appears stable.   #Abdominal aortic ectasia-noted on urology work-up October 2017 with 5-year follow-up ultrasound recommended-we will plan on this  #Constipation-Metamucil in past- doing well without this  # joint pain/back pain-mainly simply tolerates this.  Very sparing Aleve  #Macrocytic anemia-did have B12 deficiency-update labs today and will also considering checking pathology smear review if remains macrocytic and b12 over 400 Lab Results  Component Value Date   WBC 4.6 09/23/2019   HGB 14.9 09/23/2019   HCT 43.8 09/23/2019   MCV 105.9 (H) 09/23/2019   PLT 198.0 09/23/2019    Recommended follow up: Return in about 6 months (around 09/24/2020) for follow up- or sooner if needed.  Lab/Order associations:Non fasting- coffee and toast   ICD-10-CM   1. Preventative health care  Z00.00   2. Essential hypertension  I10 Lipid panel    CBC With Differential/Platelet    COMPLETE METABOLIC PANEL WITH GFR  3. B12 deficiency  E53.8 Vitamin B12  4. Idiopathic chronic gout of multiple sites without tophus  M1A.81E5 Uric acid  5. HYPERCHOLESTEROLEMIA WITH HIGH HDL  E78.00 Lipid panel    CBC With Differential/Platelet    COMPLETE METABOLIC PANEL WITH GFR  6. Ascending aortic aneurysm ([place])  I71.2 Ambulatory referral to Cardiology  7. 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 Aortic atherosclerosis ([place])  I70.0    Declines need for refills today  Return precautions advised.  Garret Reddish, MD

## 2020-03-26 ENCOUNTER — Ambulatory Visit (INDEPENDENT_AMBULATORY_CARE_PROVIDER_SITE_OTHER): Payer: PPO | Admitting: Family Medicine

## 2020-03-26 ENCOUNTER — Other Ambulatory Visit: Payer: Self-pay

## 2020-03-26 ENCOUNTER — Encounter: Payer: Self-pay | Admitting: Family Medicine

## 2020-03-26 VITALS — BP 124/80 | HR 61 | Temp 98.0°F | Resp 18 | Ht 72.0 in | Wt 226.4 lb

## 2020-03-26 DIAGNOSIS — I712 Thoracic aortic aneurysm, without rupture: Secondary | ICD-10-CM

## 2020-03-26 DIAGNOSIS — M1A09X0 Idiopathic chronic gout, multiple sites, without tophus (tophi): Secondary | ICD-10-CM | POA: Diagnosis not present

## 2020-03-26 DIAGNOSIS — I10 Essential (primary) hypertension: Secondary | ICD-10-CM

## 2020-03-26 DIAGNOSIS — I7121 Aneurysm of the ascending aorta, without rupture: Secondary | ICD-10-CM | POA: Insufficient documentation

## 2020-03-26 DIAGNOSIS — E538 Deficiency of other specified B group vitamins: Secondary | ICD-10-CM

## 2020-03-26 DIAGNOSIS — E7800 Pure hypercholesterolemia, unspecified: Secondary | ICD-10-CM | POA: Diagnosis not present

## 2020-03-26 DIAGNOSIS — I700 Atherosclerosis of aorta: Secondary | ICD-10-CM | POA: Diagnosis not present

## 2020-03-26 DIAGNOSIS — Z0000 Encounter for general adult medical examination without abnormal findings: Secondary | ICD-10-CM

## 2020-03-26 DIAGNOSIS — Z953 Presence of xenogenic heart valve: Secondary | ICD-10-CM

## 2020-03-26 LAB — CBC WITH DIFFERENTIAL/PLATELET
Absolute Monocytes: 489 cells/uL (ref 200–950)
Basophils Absolute: 38 cells/uL (ref 0–200)
Basophils Relative: 0.8 %
Eosinophils Absolute: 179 cells/uL (ref 15–500)
Eosinophils Relative: 3.8 %
HCT: 41.5 % (ref 38.5–50.0)
Hemoglobin: 14.2 g/dL (ref 13.2–17.1)
Lymphs Abs: 1401 cells/uL (ref 850–3900)
MCH: 36.1 pg — ABNORMAL HIGH (ref 27.0–33.0)
MCHC: 34.2 g/dL (ref 32.0–36.0)
MCV: 105.6 fL — ABNORMAL HIGH (ref 80.0–100.0)
MPV: 9.8 fL (ref 7.5–12.5)
Monocytes Relative: 10.4 %
Neutro Abs: 2594 cells/uL (ref 1500–7800)
Neutrophils Relative %: 55.2 %
Platelets: 216 10*3/uL (ref 140–400)
RBC: 3.93 10*6/uL — ABNORMAL LOW (ref 4.20–5.80)
RDW: 13.1 % (ref 11.0–15.0)
Total Lymphocyte: 29.8 %
WBC: 4.7 10*3/uL (ref 3.8–10.8)

## 2020-03-26 LAB — COMPLETE METABOLIC PANEL WITH GFR
AG Ratio: 1.5 (calc) (ref 1.0–2.5)
ALT: 15 U/L (ref 9–46)
AST: 22 U/L (ref 10–35)
Albumin: 4.1 g/dL (ref 3.6–5.1)
Alkaline phosphatase (APISO): 67 U/L (ref 35–144)
BUN: 19 mg/dL (ref 7–25)
CO2: 30 mmol/L (ref 20–32)
Calcium: 9.5 mg/dL (ref 8.6–10.3)
Chloride: 103 mmol/L (ref 98–110)
Creat: 0.99 mg/dL (ref 0.70–1.11)
GFR, Est African American: 80 mL/min/{1.73_m2} (ref >=60)
GFR, Est Non African American: 69 mL/min/{1.73_m2} (ref >=60)
Globulin: 2.7 g/dL (calc) (ref 1.9–3.7)
Glucose, Bld: 84 mg/dL (ref 65–99)
Potassium: 4.5 mmol/L (ref 3.5–5.3)
Sodium: 140 mmol/L (ref 135–146)
Total Bilirubin: 0.7 mg/dL (ref 0.2–1.2)
Total Protein: 6.8 g/dL (ref 6.1–8.1)

## 2020-03-26 LAB — LIPID PANEL
Cholesterol: 202 mg/dL — ABNORMAL HIGH (ref <200)
HDL: 71 mg/dL (ref >=40)
LDL Cholesterol (Calc): 108 mg/dL (calc) — ABNORMAL HIGH
Non-HDL Cholesterol (Calc): 131 mg/dL (calc) — ABNORMAL HIGH (ref <130)
Total CHOL/HDL Ratio: 2.8 (calc) (ref <5.0)
Triglycerides: 118 mg/dL (ref <150)

## 2020-03-26 LAB — URIC ACID: Uric Acid, Serum: 4.5 mg/dL (ref 4.0–8.0)

## 2020-03-26 LAB — VITAMIN B12: Vitamin B-12: 498 pg/mL (ref 200–1100)

## 2020-03-30 ENCOUNTER — Telehealth: Payer: Self-pay

## 2020-03-30 NOTE — Telephone Encounter (Signed)
Returned pt call regarding labs  

## 2020-03-30 NOTE — Telephone Encounter (Signed)
Patient is returning phone call form Barry Decker about lab results

## 2020-04-15 ENCOUNTER — Telehealth: Payer: Self-pay | Admitting: Family Medicine

## 2020-04-15 NOTE — Chronic Care Management (AMB) (Signed)
  Chronic Care Management   Note  04/15/2020 Name: JEREMAIH KLIMA MRN: [id number] DOB: 1933-03-12  [name] is a 86 y.o. year old male who is a primary care patient of Yong Channel, Brayton Mars, MD. I reached out to [name] by phone today in response to a referral sent by Mr. Wasil Wolke Fosnaugh's PCP, [name], MD.   Mr. Wile was given information about Chronic Care Management services today including:  1. CCM service includes personalized support from designated clinical staff supervised by his physician, including individualized plan of care and coordination with other care providers 2. 24/7 contact phone numbers for assistance for urgent and routine care needs. 3. Service will only be billed when office clinical staff spend 20 minutes or more in a month to coordinate care. 4. Only one practitioner may furnish and bill the service in a calendar month. 5. The patient may stop CCM services at any time (effective at the end of the month) by phone call to the office staff.   Patient agreed to services and verbal consent obtained.   Follow up plan:   Lauretta Grill Upstream Scheduler

## 2020-04-17 ENCOUNTER — Other Ambulatory Visit: Payer: Self-pay | Admitting: Cardiovascular Disease

## 2020-04-19 ENCOUNTER — Encounter: Payer: Self-pay | Admitting: Physician Assistant

## 2020-04-19 NOTE — Progress Notes (Signed)
Cardiology Office Note    Date:  04/20/2020   ID:  Barry Decker, DOB 25-Oct-1932, MRN [id number]  PCP:  [name], MD  Cardiologist:  Lauree Chandler, MD  Electrophysiologist:  None   Chief Complaint: f/u TAVR, TAA  History of Present Illness:   Barry Decker is a 86 y.o. 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 He had rheumatic fever as a child and was followed for AS for many years. In 2019 his echo demonstrated progression so he underwent cath showing nonobstructive CAD (20% mRCA, 20% pRCA, 60% D1) and eventually underwent TAVR 09/25/17 with placement of a 29 mm Edwards Sapien 3 valve from the transfemoral approach. 2D Echo showed normal LVEF. His last echo 01/2019 showed EF 50-55%, impaired relaxation, mild dilation of ascending aorta, s/p TAVR with mean gradient 50mmHg and trace AI. Last chest CTA 01/2019 showed stable ascending thoracic aortic aneurysm measuring 4.6cm, aortic atherosclerosis, emphysema, ankylosis of the thoracic spine, 1.5cm thyroid nodules (no further imaging needed for thyroid per CT).  He presents for routine follow-up today with his wife Barry Decker.  In general he feels he is doing well.  He continues to remain active around the house and working in his workshop.  He denies any new symptoms.  No chest pain.  His wife has noticed some mild dyspnea when he bends over and stands up.  She has also noticed 2 prior occasions where he coughed for several seconds in the middle of the night.  No gasping, orthopnea, coughing after eating or sustained symptoms.  The patient overall feels he is doing well.  They are originally from [place] and enjoyed reading.  Labwork independently reviewed: 03/26/20 LDL 108 (followed by PCP), Hgb 14.2 (macrocytic), CMET  wnl   Past Medical History:  Diagnosis Date  . Aortic atherosclerosis ([place])   . Aortic valve disease    a. severe aortic stenosis s/p TAVR 09/2017.  . Arthritis    "spinal" (11/16/2015)  . Atypical nevus 05/14/2013   moderate atypia - left scapula  . Colon cancer ([place])    precancer-partial colectomy  . Dilated aortic root ([place])   . Elevated HDL   . First degree AV block   . First degree AV block   . Gout   . Heart murmur   . History of kidney stones   . Hyperlipidemia   . Hypertension   . LBBB (left bundle branch block)   . Nonobstructive atherosclerosis of coronary artery   . Squamous cell skin cancer 08/04/2011   Left outer brow - CX3 + 5FU  . Thoracic aortic aneurysm [facility])     Past Surgical History:  Procedure Laterality Date  . BACK SURGERY    . CATARACT EXTRACTION W/ INTRAOCULAR LENS  IMPLANT, BILATERAL Bilateral 2017  . COLONOSCOPY W/ POLYPECTOMY    . FACET JOINT INJECTION  2009  . LITHOTRIPSY  2010  . LUMBAR LAMINECTOMY  ~ 1992  . PARTIAL COLECTOMY  1997   precancerours  . PARTIAL KNEE ARTHROPLASTY Left 11/16/2015   Procedure: LEFT KNEE UNICOMPARTMENTAL ;  Surgeon: [name], MD;  Location: [facility];  Service: Orthopedics;  Laterality: Left;  . REPLACEMENT UNICONDYLAR JOINT KNEE Left 11/16/2015  . RIGHT/LEFT HEART CATH AND CORONARY ANGIOGRAPHY N/A 08/10/2017   Procedure: RIGHT/LEFT HEART CATH  AND CORONARY ANGIOGRAPHY;  Surgeon: [name], MD;  Location: [facility];  Service: Cardiovascular;  Laterality: N/A;  . TEE WITHOUT CARDIOVERSION N/A 09/25/2017   Procedure: TRANSESOPHAGEAL ECHOCARDIOGRAM (TEE);  Surgeon: [name], MD;  Location: [facility];  Service: Open Heart Surgery;  Laterality: N/A;  . TONSILLECTOMY    . TRANSCATHETER AORTIC VALVE REPLACEMENT, TRANSFEMORAL N/A 09/25/2017   Procedure: TRANSCATHETER AORTIC VALVE REPLACEMENT, TRANSFEMORAL;  Surgeon: [name], MD;  Location: [facility];  Service: Open Heart Surgery;   Laterality: N/A;  . TYMPANOPLASTY Left 1998   ruptured ear drum with skin graft    Current Medications: Current Meds  Medication Sig  . allopurinol (ZYLOPRIM) 300 MG tablet TAKE 1 TABLET BY MOUTH EVERY DAY  . amLODipine (NORVASC) 10 MG tablet TAKE 1 TABLET BY MOUTH EVERY DAY  . amoxicillin (AMOXIL) 500 MG tablet Take 4 tablets (2,000 mg total) by mouth as directed. 1 hour prior to dental work including cleanings  . aspirin EC 81 MG EC tablet Take 1 tablet (81 mg total) by mouth daily.  . furosemide (LASIX) 20 MG tablet TAKE 1 TABLET BY MOUTH EVERY DAY **OFFICE VISIT NEEDED FOR REFILLS**  . IRON PO Take 65 mg/day by mouth daily.   . potassium citrate (UROCIT-K) 10 MEQ (1080 MG) SR tablet Take 1 tablet (10 mEq total) by mouth 3 (three) times daily with meals.  . quinapril (ACCUPRIL) 40 MG tablet Take 1 tablet (40 mg total) by mouth daily.      Allergies:   Patient has no known allergies.   Social History   Socioeconomic History  . Marital status: Married    Spouse name: Not on file  . Number of children: 5  . Years of education: Not on file  . Highest education level: Not on file  Occupational History  . Occupation: Government social research officer  Tobacco Use  . Smoking status: Former Smoker    Packs/day: 1.00    Years: 20.00    Pack years: 20.00    Types: Cigarettes    Quit date: 09/15/1980    Years since quitting: 39.6  . Smokeless tobacco: Never Used  Vaping Use  . Vaping Use: Never used  Substance and Sexual Activity  . Alcohol use: Yes    Alcohol/week: 7.0 standard drinks    Types: 1 Glasses of wine, 1 Shots of liquor, 5 Standard drinks or equivalent per week  . Drug use: No  . Sexual activity: Never  Other Topics Concern  . Not on file  Social History Narrative   Married. 5 children. 4 grandkids. 1 greatgrandkid.       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       Hobbies: woodworking      Advised to consider advanced directives/hcpoa-thinks wife  may have 37 from years ago.   Social Determinants of Health   Financial Resource Strain:   . Difficulty of Paying Living Expenses: Not on file  Food Insecurity:   . Worried About Charity fundraiser in the Last Year: Not on file  . Ran Out of Food in the Last Year: Not on file  Transportation Needs:   . Lack of Transportation (Medical): Not on file  . Lack of Transportation (Non-Medical): Not on file  Physical Activity:   . Days of Exercise per Week: Not on file  . Minutes of Exercise per Session: Not on file  Stress:   . Feeling of Stress : Not on file  Social  Connections:   . Frequency of Communication with Friends and Family: Not on file  . Frequency of Social Gatherings with Friends and Family: Not on file  . Attends Religious Services: Not on file  . Active Member of Clubs or Organizations: Not on file  . Attends Archivist Meetings: Not on file  . Marital Status: Not on file     Family History:  The patient's family history includes Heart attack (age of onset: 22) in his brother; Heart attack (age of onset: 54) in his father and mother; Hypertension in his sister.  ROS:   Please see the history of present illness. All other systems are reviewed and otherwise negative.    EKGs/Labs/Other Studies Reviewed:    Studies reviewed are outlined and summarized above. Reports included below if pertinent.  Cardiac Cath 07/2017  Mid RCA lesion is 20% stenosed.  Prox LAD lesion is 20% stenosed.  Ost 1st Diag lesion is 60% stenosed.   1. Mild non-obstructive disease in the LAD, Circumflex and RCA\ 2. Moderate stenosis small caliber Diagonal branch 3. Severe aortic stenosis (peak to peak gradient 33 mmHg, mean gradient 32 mmHg, AVA 1.37 cm2).   Recommendations: Will continue workup for AVR. I think we will likely end up pursuing TAVR but given his dilated aortic root, I want to have Dr. Cyndia Bent see him before we plan further testing.      EKG:  EKG is ordered  today, personally reviewed, demonstrating NSR 65bpm, LBBB, first degree AVB, similar to prior  Recent Labs: 03/26/2020: ALT 15; BUN 19; Creat 0.99; Hemoglobin 14.2; Platelets 216; Potassium 4.5; Sodium 140  Recent Lipid Panel    Component Value Date/Time   CHOL 202 (H) 03/26/2020 1128   TRIG 118 03/26/2020 1128   TRIG 87 06/22/2006 1013   HDL 71 03/26/2020 1128   CHOLHDL 2.8 03/26/2020 1128   VLDL 29.8 03/21/2019 1037   LDLCALC 108 (H) 03/26/2020 1128   LDLDIRECT 136.2 10/25/2012 1009    PHYSICAL EXAM:    VS:  BP (!) 142/72   Pulse 65   Ht 6' (1.829 m)   Wt 230 lb (104.3 kg)   SpO2 96%   BMI 31.19 kg/m   BMI: Body mass index is 31.19 kg/m.  GEN: Well nourished, well developed WM, in no acute distress HEENT: normocephalic, atraumatic Neck: no JVD, carotid bruits, or masses Cardiac: RRR; very soft SEM RUSB, no murmurs, rubs, or gallops, no edema  Respiratory:  clear to auscultation bilaterally, normal work of breathing GI: soft, nontender, nondistended, + BS MS: no deformity or atrophy Skin: warm and dry, no rash Neuro:  Alert and Oriented x 3, Strength and sensation are intact, follows commands Psych: euthymic mood, full affect  Wt Readings from Last 3 Encounters:  04/20/20 230 lb (104.3 kg)  03/26/20 226 lb 6.4 oz (102.7 kg)  09/23/19 238 lb 12.8 oz (108.3 kg)     ASSESSMENT & PLAN:   1. Severe aortic stenosis s/p TAVR -clinically stable. Per d/w structural APP, guidelines typically suggest continuing annual echocardiogram. Will arrange.  SBE prophylaxis reviewed.  Refill PRN amoxicillin per patient request. Continue aspirin. Mild occasional bendopnea noted, but patient appears euvolemic and offers no acute complaints himself. Continue present regimen otherwise. 2. Nonobstructive CAD -no recent progressive anginal symptoms.   3. Essential HTN -blood pressure marginally elevated today. Instructions given for checking blood pressure at home and relating readings in 1  week.  His previous blood pressures this year with primary care  were all well controlled. 4. Hyperlipidemia goal LDL <70 -his primary care doctor recently offered him a statin but he declined and wishes to work on lifestyle modifications. 5. Ascending aortic aneurysm -reviewed aneurysm precautions today.  Blood pressure monitoring as outlined above.  His last CT August 2020 suggested semiannual surveillance.  He is now due.  We will repeat CT angio of the aorta with pre-scan BMET. 6. LBBB - per records, this was new post TAVR. Also has a first degree AVB. He is not having any symptoms of progressive conduction disease. Continue to follow with each visit.  Disposition: F/u with Dr. Angelena Form in 1 year.  Medication Adjustments/Labs and Tests Ordered: Current medicines are reviewed at length with the patient today.  Concerns regarding medicines are outlined above. Medication changes, Labs and Tests ordered today are summarized above and listed in the Patient Instructions accessible in Encounters.   Signed, Charlie Pitter, PA-C  04/20/2020 10:11 AM    [address], [place], [place]  [address] Phone: [phone]; Fax: [phone]

## 2020-04-20 ENCOUNTER — Encounter: Payer: Self-pay | Admitting: Physician Assistant

## 2020-04-20 ENCOUNTER — Other Ambulatory Visit: Payer: Self-pay

## 2020-04-20 ENCOUNTER — Ambulatory Visit: Payer: PPO | Admitting: Physician Assistant

## 2020-04-20 VITALS — BP 142/72 | HR 65 | Ht 72.0 in | Wt 230.0 lb

## 2020-04-20 DIAGNOSIS — I712 Thoracic aortic aneurysm, without rupture: Secondary | ICD-10-CM

## 2020-04-20 DIAGNOSIS — E785 Hyperlipidemia, unspecified: Secondary | ICD-10-CM

## 2020-04-20 DIAGNOSIS — I10 Essential (primary) hypertension: Secondary | ICD-10-CM | POA: Diagnosis not present

## 2020-04-20 DIAGNOSIS — Z952 Presence of prosthetic heart valve: Secondary | ICD-10-CM | POA: Diagnosis not present

## 2020-04-20 DIAGNOSIS — I447 Left bundle-branch block, unspecified: Secondary | ICD-10-CM

## 2020-04-20 DIAGNOSIS — I2510 Atherosclerotic heart disease of native coronary artery without angina pectoris: Secondary | ICD-10-CM

## 2020-04-20 DIAGNOSIS — I7121 Aneurysm of the ascending aorta, without rupture: Secondary | ICD-10-CM

## 2020-04-20 LAB — BASIC METABOLIC PANEL
BUN/Creatinine Ratio: 16 (ref 10–24)
BUN: 16 mg/dL (ref 8–27)
CO2: 25 mmol/L (ref 20–29)
Calcium: 9.4 mg/dL (ref 8.6–10.2)
Chloride: 101 mmol/L (ref 96–106)
Creatinine, Ser: 1.01 mg/dL (ref 0.76–1.27)
GFR calc Af Amer: 78 mL/min/{1.73_m2} (ref >59)
GFR calc non Af Amer: 68 mL/min/{1.73_m2} (ref >59)
Glucose: 94 mg/dL (ref 65–99)
Potassium: 4.3 mmol/L (ref 3.5–5.2)
Sodium: 139 mmol/L (ref 134–144)

## 2020-04-20 MED ORDER — AMOXICILLIN 500 MG PO TABS: 2000.0000 mg | ORAL_TABLET | ORAL | 12 refills | Status: DC

## 2020-04-20 NOTE — Patient Instructions (Addendum)
Medication Instructions:  Your physician recommends that you continue on your current medications as directed. Please refer to the Current Medication list given to you today.  *If you need a refill on your cardiac medications before your next appointment, please call your pharmacy*   Lab Work: TODAY:  BMET  If you have labs (blood work) drawn today and your tests are completely normal, you will receive your results only by: Marland Kitchen MyChart Message (if you have MyChart) OR . A paper copy in the mail If you have any lab test that is abnormal or we need to change your treatment, we will call you to review the results.   Testing/Procedures: Non-Cardiac CT Angiography (CTA), is a special type of CT scan that uses a computer to produce multi-dimensional views of major blood vessels throughout the body. In CT angiography, a contrast material is injected through an IV to help visualize the blood vessels   Your physician has requested that you have an echocardiogram. Echocardiography is a painless test that uses sound waves to create images of your heart. It provides your doctor with information about the size and shape of your heart and how well your heart's chambers and valves are working. This procedure takes approximately one hour. There are no restrictions for this procedure.     Follow-Up: At [facility], you and your health needs are our priority.  As part of our continuing mission to provide you with exceptional heart care, we have created designated Provider Care Teams.  These Care Teams include your primary Cardiologist (physician) and Advanced Practice Providers (APPs -  Physician Assistants and Nurse Practitioners) who all work together to provide you with the care you need, when you need it.  We recommend signing up for the patient portal called "MyChart".  Sign up information is provided on this After Visit Summary.  MyChart is used to connect with patients for Virtual Visits  (Telemedicine).  Patients are able to view lab/test results, encounter notes, upcoming appointments, etc.  Non-urgent messages can be sent to your provider as well.   To learn more about what you can do with MyChart, go to [email or web address].    Your next appointment:   12 month(s)  The format for your next appointment:   In Person  Provider:   You may see Lauree Chandler, MD or one of the following Advanced Practice Providers on your designated Care Team:    Melina Copa, PA-C  Ermalinda Barrios, PA-C    Other Instructions Endocarditis Information  You may be at risk for developing endocarditis since you have an artificial heart valve or a repaired heart valve. Endocarditis is an infection of the lining of the heart or heart valves. Certain surgical and dental procedures may put you at risk, such as teeth cleaning or other dental procedures or other medical procedures. Notify your doctor or dentist before having any invasive or surgical procedures. You will need to take antibiotics before certain procedures. To prevent endocarditis, maintain good oral health. Seek prompt medical attention for any mouth/gum, skin or urinary tract infections.  Blood Pressure I would recommend using a blood pressure cuff that goes on your arm. The wrist ones can be inaccurate. If possible, try to select one that also reports your heart rate. To check your blood pressure, choose a time at least 3 hours after taking your blood pressure medicines. If you can sample it at different times of the day, that's great - it might give you more information about how  your blood pressure fluctuates. Remain seated in a chair for 5 minutes quietly beforehand, then check it. Please record a list of those readings and call us/send in MyChart message with them for our review in 1 week.  Information About Your Aneurysm One of your tests has shown an aneurysm of your aorta. The word "aneurysm" refers to a bulge in an artery  (blood vessel). Most people think of them in the context of an emergency, but yours was found incidentally. At this point there is nothing you need to do from a procedure standpoint, but there are some important things to keep in mind for day-to-day life.  Mainstays of therapy for aneurysms include very good blood pressure control, healthy lifestyle, and avoiding tobacco products and street drugs. Research has raised concern that antibiotics in the fluoroquinolone class could be associated with increased risk of having an aneurysm develop or tear. This includes medicines that end in "floxacin," like Cipro or Levaquin. Make sure to discuss this information with other healthcare providers if you require antibiotics.  Since aneurysms can run in families, you should discuss your diagnosis with first degree relatives as they may need to be screened for this. Regular mild-moderate physical exercise is important, but avoid heavy lifting/weight lifting over 30lbs, chopping wood, shoveling snow or digging heavy earth with a shovel. It is best to avoid activities that cause grunting or straining (medically referred to as a "Valsalva maneuver"). This happens when a person bears down against a closed throat to increase the strength of arm or abdominal muscles. There's often a tendency to do this when lifting heavy weights, doing sit-ups, push-ups or chin-ups, etc., but it may be harmful.  This is a finding I would expect to be monitored periodically by your cardiology team. Most unruptured thoracic aortic aneurysms cause no symptoms, so they are often found during exams for other conditions. Contact a health care provider if you develop any discomfort in your upper back, neck, abdomen, trouble swallowing, cough or hoarseness, or unexplained weight loss. Get help right away if you develop severe pain in your upper back or abdomen that may move into your chest and arms, or any other concerning symptoms such as shortness of  breath or fever.

## 2020-04-21 DIAGNOSIS — R351 Nocturia: Secondary | ICD-10-CM | POA: Diagnosis not present

## 2020-04-21 DIAGNOSIS — N401 Enlarged prostate with lower urinary tract symptoms: Secondary | ICD-10-CM | POA: Diagnosis not present

## 2020-04-21 DIAGNOSIS — N200 Calculus of kidney: Secondary | ICD-10-CM | POA: Diagnosis not present

## 2020-04-27 ENCOUNTER — Other Ambulatory Visit: Payer: Self-pay | Admitting: Cardiovascular Disease

## 2020-04-30 DIAGNOSIS — Z79899 Other long term (current) drug therapy: Secondary | ICD-10-CM

## 2020-04-30 NOTE — Telephone Encounter (Signed)
Please thank pt for sending in review of readings. Top number still slightly elevated compared to goal especially in setting of known aneurysm. Would not be too aggressive but would like to see the top number consistently <130. Would start HCTZ 12.5mg  daily to start with BMET 1 week after starting. Follow BP log just as he has been doing - will check in readings when we get the BMET.  While on HCTZ make sure to include potassium rich foods in diet (as long as no allergy to these foods - bananas, squash, yogurt, white beans, sweet potatoes, leafy greens, and avocados).  Re; amoxicillin refill - I see the rx was sent in 11/2 but says "no print" so perhaps did not get electronically sent. Please resubmit. Would do #8 with 2 refills. Thx! Robt Okuda PA-C

## 2020-05-03 MED ORDER — HYDROCHLOROTHIAZIDE 12.5 MG PO CAPS: 12.5000 mg | ORAL_CAPSULE | Freq: Every day | ORAL | 3 refills | Status: DC

## 2020-05-03 MED ORDER — AMOXICILLIN 500 MG PO TABS
2000.0000 mg | ORAL_TABLET | ORAL | 2 refills | Status: DC
Start: 2020-05-03 — End: 2021-11-16

## 2020-05-03 NOTE — Addendum Note (Signed)
Addended by: Gaetano Net on: 05/03/2020 07:27 AM   Modules accepted: Orders

## 2020-05-04 ENCOUNTER — Ambulatory Visit: Payer: PPO

## 2020-05-11 ENCOUNTER — Other Ambulatory Visit: Payer: PPO | Admitting: *Deleted

## 2020-05-11 ENCOUNTER — Other Ambulatory Visit: Payer: Self-pay

## 2020-05-11 DIAGNOSIS — Z79899 Other long term (current) drug therapy: Secondary | ICD-10-CM | POA: Diagnosis not present

## 2020-05-11 LAB — BASIC METABOLIC PANEL
BUN/Creatinine Ratio: 17 (ref 10–24)
BUN: 19 mg/dL (ref 8–27)
CO2: 27 mmol/L (ref 20–29)
Calcium: 9.3 mg/dL (ref 8.6–10.2)
Chloride: 99 mmol/L (ref 96–106)
Creatinine, Ser: 1.1 mg/dL (ref 0.76–1.27)
GFR calc Af Amer: 70 mL/min/{1.73_m2} (ref >59)
GFR calc non Af Amer: 61 mL/min/{1.73_m2} (ref >59)
Glucose: 111 mg/dL — ABNORMAL HIGH (ref 65–99)
Potassium: 4.4 mmol/L (ref 3.5–5.2)
Sodium: 137 mmol/L (ref 134–144)

## 2020-05-17 ENCOUNTER — Ambulatory Visit (HOSPITAL_COMMUNITY): Payer: PPO | Attending: Cardiology

## 2020-05-17 ENCOUNTER — Ambulatory Visit (INDEPENDENT_AMBULATORY_CARE_PROVIDER_SITE_OTHER)
Admission: RE | Admit: 2020-05-17 | Discharge: 2020-05-17 | Disposition: A | Discharge status: Home / Self Care | Payer: PPO | Source: Ambulatory Visit | Attending: Physician Assistant | Admitting: Physician Assistant

## 2020-05-17 ENCOUNTER — Other Ambulatory Visit: Payer: Self-pay

## 2020-05-17 DIAGNOSIS — I2510 Atherosclerotic heart disease of native coronary artery without angina pectoris: Secondary | ICD-10-CM

## 2020-05-17 DIAGNOSIS — I10 Essential (primary) hypertension: Secondary | ICD-10-CM

## 2020-05-17 DIAGNOSIS — I712 Thoracic aortic aneurysm, without rupture: Secondary | ICD-10-CM

## 2020-05-17 DIAGNOSIS — I7121 Aneurysm of the ascending aorta, without rupture: Secondary | ICD-10-CM

## 2020-05-17 DIAGNOSIS — E785 Hyperlipidemia, unspecified: Secondary | ICD-10-CM | POA: Insufficient documentation

## 2020-05-17 DIAGNOSIS — Z952 Presence of prosthetic heart valve: Secondary | ICD-10-CM | POA: Diagnosis not present

## 2020-05-17 DIAGNOSIS — R911 Solitary pulmonary nodule: Secondary | ICD-10-CM | POA: Diagnosis not present

## 2020-05-17 LAB — ECHOCARDIOGRAM COMPLETE
AR max vel: 1.14 cm2
AV Area VTI: 1.2 cm2
AV Area mean vel: 1.16 cm2
AV Mean grad: 21 mmHg
AV Peak grad: 39.9 mmHg
Ao pk vel: 3.16 m/s
Area-P 1/2: 4.25 cm2
P 1/2 time: 420 msec
S' Lateral: 3.5 cm

## 2020-05-17 MED ORDER — IOHEXOL 350 MG/ML SOLN
100.0000 mL | Freq: Once | INTRAVENOUS | Status: AC | PRN
  Administered 2020-05-17: 100 mL via INTRAVENOUS

## 2020-05-18 ENCOUNTER — Other Ambulatory Visit: Payer: PPO

## 2020-05-18 ENCOUNTER — Telehealth: Payer: Self-pay | Admitting: *Deleted

## 2020-05-18 ENCOUNTER — Other Ambulatory Visit: Payer: Self-pay | Admitting: Physician Assistant

## 2020-05-18 ENCOUNTER — Encounter: Payer: Self-pay | Admitting: Physician Assistant

## 2020-05-18 DIAGNOSIS — Z952 Presence of prosthetic heart valve: Secondary | ICD-10-CM

## 2020-05-18 DIAGNOSIS — I712 Thoracic aortic aneurysm, without rupture, unspecified: Secondary | ICD-10-CM

## 2020-05-18 NOTE — Progress Notes (Signed)
Mr. Russey,   Please arrive at the [facility] main entrance of [facility] at 10:15 AM on Friday 05/21/20.   [facility] [address] [place], [place] [address] [phone]  Proceed to the [facility] Radiology Department (First Floor).  Please follow these instructions carefully (unless otherwise directed):  Hold all erectile dysfunction medications at least 72 hours prior to test.  On the Night Before the Test: . Drink plenty of water. . Do not consume any caffeinated/decaffeinated beverages or chocolate 12 hours prior to your test. . Do not take any antihistamines 12 hours prior to your test.  On the Day of the Test: . Drink plenty of water. Do not drink any water within one hour of the test. . Do not eat any food 4 hours prior to the test. Nothing to eat after 6:45am.  . You may take your regular medications prior to the test. . IF NOT ON A BETA BLOCKER - Take 50 mg of Lopressor (metoprolol) one hour before the test. . HOLD Furosemide, hydrochlorothiazide, quinapril, and potassium on morning of the test.  After the Test: . Drink plenty of water. . After receiving IV contrast, you may experience a mild flushed feeling. This is normal. . On occasion, you may experience a mild rash up to 24 hours after the test. This is not dangerous. If this occurs, you can take Benadryl 25 mg and increase your fluid intake. . If you experience trouble breathing, this can be serious. If it is severe call 911 IMMEDIATELY. If it is mild, please call our office.  Thank you,  Randal Buba [phone]

## 2020-05-18 NOTE — Telephone Encounter (Signed)
-----   Message from Charlie Pitter, [place] sent at 05/17/2020  2:41 PM EST ----- Please let Barry Decker know study showed stable enlargement of the aortic aneurysm at 4.6-4.7cm, similar to prior. Incidental findings include a small kidney stone not obstructing flow in the left kidney and small pulmonary nodules - the latter of which have been stable since 2019 and are felt benign. Given the aneurysm size the current guidelines suggest scanning every 6 months so would repeat study in 6 months with a BMET before study. I never did see his post-HCTZ BP log come through from medical records so please either ask Barry Decker for readings or ask him to begin recording and send/call readings after a few days of logging.

## 2020-05-21 ENCOUNTER — Other Ambulatory Visit: Payer: Self-pay

## 2020-05-21 ENCOUNTER — Ambulatory Visit (HOSPITAL_COMMUNITY)
Admission: RE | Admit: 2020-05-21 | Discharge: 2020-05-21 | Disposition: A | Discharge status: Home / Self Care | Payer: PPO | Source: Ambulatory Visit | Attending: Physician Assistant | Admitting: Physician Assistant

## 2020-05-21 DIAGNOSIS — Z952 Presence of prosthetic heart valve: Secondary | ICD-10-CM | POA: Insufficient documentation

## 2020-05-21 DIAGNOSIS — I2510 Atherosclerotic heart disease of native coronary artery without angina pectoris: Secondary | ICD-10-CM | POA: Diagnosis not present

## 2020-05-21 DIAGNOSIS — I8290 Acute embolism and thrombosis of unspecified vein: Secondary | ICD-10-CM | POA: Diagnosis not present

## 2020-05-21 DIAGNOSIS — I712 Thoracic aortic aneurysm, without rupture: Secondary | ICD-10-CM | POA: Insufficient documentation

## 2020-05-21 DIAGNOSIS — I700 Atherosclerosis of aorta: Secondary | ICD-10-CM | POA: Diagnosis not present

## 2020-05-21 DIAGNOSIS — Z953 Presence of xenogenic heart valve: Secondary | ICD-10-CM | POA: Diagnosis not present

## 2020-05-21 MED ORDER — IOHEXOL 350 MG/ML SOLN
80.0000 mL | Freq: Once | INTRAVENOUS | Status: AC | PRN
  Administered 2020-05-21: 80 mL via INTRAVENOUS

## 2020-05-22 ENCOUNTER — Telehealth: Payer: Self-pay | Admitting: Physician Assistant

## 2020-05-22 NOTE — Telephone Encounter (Signed)
entered in error   

## 2020-05-25 ENCOUNTER — Other Ambulatory Visit: Payer: Self-pay | Admitting: Physician Assistant

## 2020-05-25 DIAGNOSIS — Z952 Presence of prosthetic heart valve: Secondary | ICD-10-CM

## 2020-05-25 MED ORDER — WARFARIN SODIUM 5 MG PO TABS: 5.0000 mg | ORAL_TABLET | Freq: Every day | ORAL | 11 refills | Status: DC

## 2020-05-31 ENCOUNTER — Ambulatory Visit (INDEPENDENT_AMBULATORY_CARE_PROVIDER_SITE_OTHER): Payer: PPO | Admitting: *Deleted

## 2020-05-31 ENCOUNTER — Other Ambulatory Visit: Payer: Self-pay

## 2020-05-31 DIAGNOSIS — Z5181 Encounter for therapeutic drug level monitoring: Secondary | ICD-10-CM | POA: Diagnosis not present

## 2020-05-31 DIAGNOSIS — I8290 Acute embolism and thrombosis of unspecified vein: Secondary | ICD-10-CM | POA: Diagnosis not present

## 2020-05-31 DIAGNOSIS — Z953 Presence of xenogenic heart valve: Secondary | ICD-10-CM

## 2020-05-31 LAB — POCT INR: INR: 1.3 — AB (ref 2.0–3.0)

## 2020-05-31 NOTE — Patient Instructions (Addendum)
A full discussion of the nature of anticoagulants has been carried out.  A benefit risk analysis has been presented to the patient, so that they understand the justification for choosing anticoagulation at this time. The need for frequent and regular monitoring, precise dosage adjustment and compliance is stressed.  Side effects of potential bleeding are discussed.  The patient should avoid any OTC items containing aspirin or ibuprofen, and should avoid great swings in general diet.  Avoid alcohol consumption.  Call if any signs of abnormal bleeding.  Description   Today take 1.5 tablets then start taking 1 tablet daily except 1.5 tablets on Tuesday and Saturdays. Recheck INR in 1 week. Anticoagulation Clinic [phone] Main [phone]

## 2020-06-01 ENCOUNTER — Other Ambulatory Visit: Payer: Self-pay

## 2020-06-01 MED ORDER — QUINAPRIL HCL 40 MG PO TABS
40.0000 mg | ORAL_TABLET | Freq: Every day | ORAL | 3 refills | Status: DC
Start: 2020-06-01 — End: 2020-09-27

## 2020-06-02 ENCOUNTER — Ambulatory Visit: Payer: PPO

## 2020-06-02 ENCOUNTER — Other Ambulatory Visit: Payer: Self-pay

## 2020-06-02 DIAGNOSIS — I10 Essential (primary) hypertension: Secondary | ICD-10-CM

## 2020-06-02 DIAGNOSIS — M1A09X0 Idiopathic chronic gout, multiple sites, without tophus (tophi): Secondary | ICD-10-CM

## 2020-06-02 DIAGNOSIS — E7800 Pure hypercholesterolemia, unspecified: Secondary | ICD-10-CM

## 2020-06-02 NOTE — Patient Instructions (Addendum)
[name],  Thank you for taking the time to review your medications with me today.  I have included our care plan/goals in the following pages. Please review and call me at [phone] with any questions!  Thanks! Barry Decker, Pharm.D., BCGP Clinical Pharmacist [facility] [phone]  Goals Addressed            This Visit's Progress   . PharmD Care Plan       CARE PLAN ENTRY (see longitudinal plan of care for additional care plan information)  Current Barriers:  . Chronic Disease Management support, education, and care coordination needs related to Hypertension, Hyperlipidemia, and gout.   Hypertension BP Readings from Last 3 Encounters:  05/21/20 (!) 143/76  04/20/20 (!) 142/72  03/26/20 124/80   . Pharmacist Clinical Goal(s): o Over the next 365 days, patient will work with PharmD and providers to maintain BP goal <140/90 . Current regimen:  . Hydrochlorothiazide 12.5 mg capsule once daily . Amlodipine 10 mg once daily . Quinapril 40 mg once daily . Interventions: o Reviewed home monitoring recommendations, side effects, diet/exercise - Maintain a healthy weight and exercise regularly, as directed by your health care provider. Eat healthy foods, such as: Lean proteins, complex carbohydrates, fresh fruits and vegetables, low-fat dairy products, healthy fats. . Patient self care activities - Over the next 365 days, patient will: o Check BP at least once every 1-2 weeks, document, and provide at future appointments o Ensure daily salt intake < 2300 mg/day  Hyperlipidemia Lab Results  Component Value Date/Time   LDLCALC 108 (H) 03/26/2020 11:28 AM   LDLDIRECT 136.2 10/25/2012 10:09 AM   . Pharmacist Clinical Goal(s): o Over the next 180 days, patient will work with PharmD and providers to achieve LDL goal < 70 . Current regimen:  o No medications at this time . Interventions: o Discussed diet/exercise and counseled on  use of rosuvastatin  . Patient self care activities - Over the next 365 days, patient will: o Continue current management  Gout . Pharmacist Clinical Goal(s) o Over the next 365 days, patient will work with PharmD and providers to minimize gout symptoms, provided education on prescriptions and dietary needs as needed . Current regimen:  o Allopurinol 300 mg once daily . Interventions: o Reviewed low purine diet - handout provided . Patient self care activities - Over the next 365 days, patient will: o Continue current management  Medication management . Pharmacist Clinical Goal(s): o Over the next 365 days, patient will work with PharmD and providers to maintain optimal medication adherence . Current pharmacy: [facility] . Interventions o Comprehensive medication review performed. o Continue current medication management strategy . Patient self care activities - Over the next 365 days, patient will: o Take medications as prescribed o Report any questions or concerns to PharmD and/or provider(s) Initial goal documentation.      The patient verbalized understanding of instructions provided today and agreed to receive a mailed copy of patient instruction and/or educational materials. Telephone follow up appointment with pharmacy team member scheduled for: See next appointment with "Care Management Staff" under "What's Next" below.   Vitamin K Foods and Warfarin Warfarin is a blood thinner (anticoagulant). Anticoagulant medicines help prevent the formation of blood clots. These medicines work by decreasing the activity of vitamin K, which promotes normal blood clotting. When you take warfarin, problems can occur from suddenly increasing or decreasing the amount of vitamin K that you  eat from one day to the next. Problems may include:  Blood clots.  Bleeding. What general guidelines do I need to follow? To avoid problems when taking warfarin:  Eat a balanced diet that includes: ? Fresh  fruits and vegetables. ? Whole grains. ? Low-fat dairy products. ? Lean proteins, such as fish, eggs, and lean cuts of meat.  Keep your intake of vitamin K consistent from day to day. To do this: ? Avoid eating large amounts of vitamin K one day and low amounts of vitamin K the next day. ? If you take a multivitamin that contains vitamin K, be sure to take it every day. ? Know which foods contain vitamin K. Use the lists below to understand serving sizes and the amount of vitamin K in one serving.  Avoid major changes in your diet. If you are going to change your diet, talk with your health care provider before making changes.  Work with a Financial planner (dietitian) to develop a meal plan that works best for you.  High vitamin K foods Foods that are high in vitamin K contain more than 100 mcg (micrograms) per serving. These include:  Broccoli (cooked) -  cup has 110 mcg.  Brussels sprouts (cooked) -  cup has 109 mcg.  Greens, beet (cooked) -  cup has 350 mcg.  Greens, collard (cooked) -  cup has 418 mcg.  Greens, turnip (cooked) -  cup has 265 mcg.  Green onions or scallions -  cup has 105 mcg.  Kale (fresh or frozen) -  cup has 531 mcg.  Parsley (raw) - 10 sprigs has 164 mcg.  Spinach (cooked) -  cup has 444 mcg.  Swiss chard (cooked) -  cup has 287 mcg. Moderate vitamin K foods Foods that have a moderate amount of vitamin K contain 25-100 mcg per serving. These include:  Asparagus (cooked) - 5 spears have 38 mcg.  Black-eyed peas (dried) -  cup has 32 mcg.  Cabbage (cooked) -  cup has 37 mcg.  Kiwi fruit - 1 medium has 31 mcg.  Lettuce - 1 cup has 57-63 mcg.  Okra (frozen) -  cup has 44 mcg.  Prunes (dried) - 5 prunes have 25 mcg.  Watercress (raw) - 1 cup has 85 mcg. Low vitamin K foods Foods low in vitamin K contain less than 25 mcg per serving. These include:  Artichoke - 1 medium has 18 mcg.  Avocado - 1 oz. has 6 mcg.  Blueberries  -  cup has 14 mcg.  Cabbage (raw) -  cup has 21 mcg.  Carrots (cooked) -  cup has 11 mcg.  Cauliflower (raw) -  cup has 11 mcg.  Cucumber with peel (raw) -  cup has 9 mcg.  Grapes -  cup has 12 mcg.  Mango - 1 medium has 9 mcg.  Nuts - 1 oz. has 15 mcg.  Pear - 1 medium has 8 mcg.  Peas (cooked) -  cup has 19 mcg.  Pickles - 1 spear has 14 mcg.  Pumpkin seeds - 1 oz. has 13 mcg.  Sauerkraut (canned) -  cup has 16 mcg.  Soybeans (cooked) -  cup has 16 mcg.  Tomato (raw) - 1 medium has 10 mcg.  Tomato sauce -  cup has 17 mcg. Vitamin K-free foods If a food contain less than 5 mcg per serving, it is considered to have no vitamin K. These foods include:  Bread and cereal products.  Cheese.  Eggs.  Fish  and shellfish.  Meat and poultry.  Milk and dairy products.  Sunflower seeds. Actual amounts of vitamin K in foods may be different depending on processing. Talk with your dietitian about what foods you can eat and what foods you should avoid. This information is not intended to replace advice given to you by your health care provider. Make sure you discuss any questions you have with your health care provider. Document Revised: 05/18/2017 Document Reviewed: 09/08/2015 Elsevier Patient Education  [place] A low-purine eating plan involves making food choices to limit your intake of purine. Purine is a kind of uric acid. Too much uric acid in your blood can cause certain conditions, such as gout and kidney stones. Eating a low-purine diet can help control these conditions. What are tips for following this plan? Reading food labels   Avoid foods with saturated or Trans fat.  Check the ingredient list of grains-based foods, such as bread and cereal, to make sure that they contain whole grains.  Check the ingredient list of sauces or soups to make sure they do not contain meat or fish.  When choosing soft drinks, check  the ingredient list to make sure they do not contain high-fructose corn syrup. Shopping  Buy plenty of fresh fruits and vegetables.  Avoid buying canned or fresh fish.  Buy dairy products labeled as low-fat or nonfat.  Avoid buying premade or processed foods. These foods are often high in fat, salt (sodium), and added sugar. Cooking  Use olive oil instead of butter when cooking. Oils like olive oil, canola oil, and sunflower oil contain healthy fats. Meal planning  Learn which foods do or do not affect you. If you find out that a food tends to cause your gout symptoms to flare up, avoid eating that food. You can enjoy foods that do not cause problems. If you have any questions about a food item, talk with your dietitian or health care provider.  Limit foods high in fat, especially saturated fat. Fat makes it harder for your body to get rid of uric acid.  Choose foods that are lower in fat and are lean sources of protein. General guidelines  Limit alcohol intake to no more than 1 drink a day for nonpregnant women and 2 drinks a day for men. One drink equals 12 oz of beer, 5 oz of wine, or 1 oz of hard liquor. Alcohol can affect the way your body gets rid of uric acid.  Drink plenty of water to keep your urine clear or pale yellow. Fluids can help remove uric acid from your body.  If directed by your health care provider, take a vitamin C supplement.  Work with your health care provider and dietitian to develop a plan to achieve or maintain a healthy weight. Losing weight can help reduce uric acid in your blood. What foods are recommended? The items listed may not be a complete list. Talk with your dietitian about what dietary choices are best for you. Foods low in purines Foods low in purines do not need to be limited. These include:  All fruits.  All low-purine vegetables, pickles, and olives.  Breads, pasta, rice, cornbread, and popcorn. Cake and other baked goods.  All  dairy foods.  Eggs, nuts, and nut butters.  Spices and condiments, such as salt, herbs, and vinegar.  Plant oils, butter, and margarine.  Water, sugar-free soft drinks, tea, coffee, and cocoa.  Vegetable-based soups, broths, sauces, and gravies. Foods  moderate in purines Foods moderate in purines should be limited to the amounts listed.   cup of asparagus, cauliflower, spinach, mushrooms, or green peas, each day.  2/3 cup uncooked oatmeal, each day.   cup dry wheat bran or wheat germ, each day.  2-3 ounces of meat or poultry, each day.  4-6 ounces of shellfish, such as crab, lobster, oysters, or shrimp, each day.  1 cup cooked beans, peas, or lentils, each day.  Soup, broths, or bouillon made from meat or fish. Limit these foods as much as possible. What foods are not recommended? The items listed may not be a complete list. Talk with your dietitian about what dietary choices are best for you. Limit your intake of foods high in purines, including:  Beer and other alcohol.  Meat-based gravy or sauce.  Canned or fresh fish, such as: ? Anchovies, sardines, herring, and tuna. ? Mussels and scallops. ? Codfish, trout, and haddock.  Berniece Salines.  Organ meats, such as: ? Liver or kidney. ? Tripe. ? Sweetbreads (thymus gland or pancreas).  Wild Clinical biochemist.  Yeast or yeast extract supplements.  Drinks sweetened with high-fructose corn syrup. Summary  Eating a low-purine diet can help control conditions caused by too much uric acid in the body, such as gout or kidney stones.  Choose low-purine foods, limit alcohol, and limit foods high in fat.  You will learn over time which foods do or do not affect you. If you find out that a food tends to cause your gout symptoms to flare up, avoid eating that food. This information is not intended to replace advice given to you by your health care provider. Make sure you discuss any questions you have with your health care  provider. Document Revised: 05/18/2017 Document Reviewed: 07/19/2016 Elsevier Patient Education  2020 [employer].

## 2020-06-02 NOTE — Progress Notes (Signed)
Chronic Care Management [facility]: [name]     MRN: [id number]     DOB: [birth year]  Chief Complaint/ HPI Barry Decker, 86 y.o., male, presents for their initial CCM visit with the clinical pharmacist in office.  PCP : [name], MD Encounter Diagnoses  Name Primary?  . Essential hypertension Yes  . HYPERCHOLESTEROLEMIA WITH HIGH HDL   . Idiopathic chronic gout of multiple sites without tophus     Office Visits:  03/26/2020 (PCP): LDL above 70, consider once weekly rosuvastatin. Declined at this time, working on diet and exercise. Uric acid good. Start OTC b12 1000 mcg daily until next blood work.   Patient Active Problem List   Diagnosis Date Noted  . Thrombus 05/31/2020  . Encounter for therapeutic drug monitoring 05/31/2020  . Ascending aortic aneurysm ([place]) 03/26/2020  . Aortic atherosclerosis ([place]) 03/26/2020  . B12 deficiency 09/23/2019  . Macrocytic anemia 03/21/2019  . 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 Kidney stones   . Chronic diastolic CHF (congestive heart failure) ([place]) 03/30/2017  . Abdominal aortic ectasia ([place]) 04/07/2016  . Osteoarthritis of cervical spine 08/31/2015  . Throat pain in adult 08/31/2015  . Osteoarthritis of left knee 05/25/2014  . HYPERCHOLESTEROLEMIA WITH HIGH HDL 01/31/2007  . Gout 01/31/2007  . Essential hypertension 01/31/2007  . History of malignant neoplasm of large intestine 01/31/2007   Past Surgical History:  Procedure Laterality Date  . BACK SURGERY    . CATARACT EXTRACTION W/ INTRAOCULAR LENS  IMPLANT, BILATERAL Bilateral 2017  . COLONOSCOPY W/ POLYPECTOMY    . FACET JOINT INJECTION  2009  . LITHOTRIPSY  2010  . LUMBAR LAMINECTOMY  ~ 1992  . PARTIAL COLECTOMY  1997   precancerours  . PARTIAL KNEE ARTHROPLASTY Left 11/16/2015   Procedure: LEFT KNEE UNICOMPARTMENTAL ;  Surgeon: [name], MD;  Location: [facility];  Service: Orthopedics;  Laterality:  Left;  . REPLACEMENT UNICONDYLAR JOINT KNEE Left 11/16/2015  . RIGHT/LEFT HEART CATH AND CORONARY ANGIOGRAPHY N/A 08/10/2017   Procedure: RIGHT/LEFT HEART CATH AND CORONARY ANGIOGRAPHY;  Surgeon: [name], MD;  Location: [facility];  Service: Cardiovascular;  Laterality: N/A;  . TEE WITHOUT CARDIOVERSION N/A 09/25/2017   Procedure: TRANSESOPHAGEAL ECHOCARDIOGRAM (TEE);  Surgeon: [name], MD;  Location: [facility];  Service: Open Heart Surgery;  Laterality: N/A;  . TONSILLECTOMY    . TRANSCATHETER AORTIC VALVE REPLACEMENT, TRANSFEMORAL N/A 09/25/2017   Procedure: TRANSCATHETER AORTIC VALVE REPLACEMENT, TRANSFEMORAL;  Surgeon: [name], MD;  Location: [facility];  Service: Open Heart Surgery;  Laterality: N/A;  . TYMPANOPLASTY Left 1998   ruptured ear drum with skin graft   Family History  Problem Relation Age of Onset  . Heart attack Mother 86       former smoker  . Heart attack Father 1       former smoker  . Hypertension Sister   . Heart attack Brother 26       Rheumatic fever   No Known Allergies Outpatient Encounter Medications as of 06/02/2020  Medication Sig  . allopurinol (ZYLOPRIM) 300 MG tablet TAKE 1 TABLET BY MOUTH EVERY DAY  . amLODipine (NORVASC) 10 MG tablet TAKE 1 TABLET BY MOUTH EVERY DAY  . furosemide (LASIX) 20 MG tablet Take 1 tablet (20 mg total) by mouth daily.  . hydrochlorothiazide (MICROZIDE) 12.5 MG capsule Take 1 capsule (12.5 mg total) by mouth daily.  . IRON PO Take 65  mg/day by mouth daily.   . potassium citrate (UROCIT-K) 10 MEQ (1080 MG) SR tablet Take 1 tablet (10 mEq total) by mouth 3 (three) times daily with meals.  . quinapril (ACCUPRIL) 40 MG tablet Take 1 tablet (40 mg total) by mouth daily.  Marland Kitchen warfarin (COUMADIN) 5 MG tablet Take 1 tablet (5 mg total) by mouth daily.  Marland Kitchen amoxicillin (AMOXIL) 500 MG tablet Take 4 tablets (2,000 mg total) by mouth as directed. 1 hour prior to dental work including cleanings    No facility-administered encounter medications on file as of 06/02/2020.   Patient Care Team    Relationship Specialty Notifications Start End  [name], MD PCP - General Family Medicine  07/22/14    Comment: Awilda Metro, Annita Brod, MD PCP - Cardiology Cardiology Admissions 07/04/17   Madelin Rear, [facility] Pharmacist Pharmacist  04/15/20    Comment: [phone]   Current Diagnosis/Assessment: Goals Addressed            This Visit's Progress   . PharmD Care Plan       CARE PLAN ENTRY (see longitudinal plan of care for additional care plan information)  Current Barriers:  . Chronic Disease Management support, education, and care coordination needs related to Hypertension, Hyperlipidemia, and gout.   Hypertension BP Readings from Last 3 Encounters:  05/21/20 (!) 143/76  04/20/20 (!) 142/72  03/26/20 124/80   . Pharmacist Clinical Goal(s): o Over the next 365 days, patient will work with PharmD and providers to maintain BP goal <140/90 . Current regimen:  . Hydrochlorothiazide 12.5 mg capsule once daily . Amlodipine 10 mg once daily . Quinapril 40 mg once daily . Interventions: o Reviewed home monitoring recommendations, side effects, diet/exercise - Maintain a healthy weight and exercise regularly, as directed by your health care provider. Eat healthy foods, such as: Lean proteins, complex carbohydrates, fresh fruits and vegetables, low-fat dairy products, healthy fats. . Patient self care activities - Over the next 365 days, patient will: o Check BP at least once every 1-2 weeks, document, and provide at future appointments o Ensure daily salt intake < 2300 mg/day  Hyperlipidemia Lab Results  Component Value Date/Time   LDLCALC 108 (H) 03/26/2020 11:28 AM   LDLDIRECT 136.2 10/25/2012 10:09 AM   . Pharmacist Clinical Goal(s): o Over the next 180 days, patient will work with PharmD and providers to achieve LDL goal < 70 . Current regimen:  o No  medications at this time . Interventions: o Discussed diet/exercise and counseled on use of rosuvastatin  . Patient self care activities - Over the next 365 days, patient will: o Continue current management  Gout . Pharmacist Clinical Goal(s) o Over the next 365 days, patient will work with PharmD and providers to minimize gout symptoms, provided education on prescriptions and dietary needs as needed . Current regimen:  o Allopurinol 300 mg once daily . Interventions: o Reviewed low purine diet - handout provided . Patient self care activities - Over the next 365 days, patient will: o Continue current management  Medication management . Pharmacist Clinical Goal(s): o Over the next 365 days, patient will work with PharmD and providers to maintain optimal medication adherence . [facility] . Interventions o Comprehensive medication review performed. o Continue current medication management strategy . Patient self care activities - Over the next 365 days, patient will: o Take medications as prescribed o Report any questions or concerns to PharmD and/or provider(s) Initial goal documentation.  Hypertension   BP goal <140/90  BP Readings from Last 3 Encounters:  05/21/20 (!) 143/76  04/20/20 (!) 142/72  03/26/20 124/80    BMP Latest Ref Rng & Units 05/11/2020 04/20/2020 03/26/2020  Glucose 65 - 99 mg/dL 111(H) 94 84  BUN 8 - 27 mg/dL 19 16 19   Creatinine 0.76 - 1.27 mg/dL 1.10 1.01 0.99  BUN/Creat Ratio 10 - 24 17 16  NOT APPLICABLE  Sodium 034 - 144 mmol/L 137 139 140  Potassium 3.5 - 5.2 mmol/L 4.4 4.3 4.5  Chloride 96 - 106 mmol/L 99 101 103  CO2 20 - 29 mmol/L 27 25 30   Calcium 8.6 - 10.2 mg/dL 9.3 9.4 9.5   Patient checks BP at home several times per month.  Recent home readings:   12/3   9:00 am    128/69  12/4    12 noon    129/72  12/5     1:00 pm    125/72  12/6      9:00 pm    122/68  12/7       5:00 pm    142/80  12/8      12 noon      127/71  12/9      9:00 pm     112/69  12/10    12 noon     119/63  On furosemide 20 mg once daily. On potassium supplement - potassium citrate 10 meq tablet three times daily.  Patient is currently at goal on the following medications:  . Hydrochlorothiazide 12.5 mg capsule once daily . Amlodipine 10 mg once daily . Quinapril 40 mg once daily  Diet and exercise discussed - Maintain a healthy weight and exercise regularly, as directed by your health care provider. Eat healthy foods, such as: Lean proteins, complex carbohydrates, fresh fruits and vegetables, low-fat dairy products, healthy fats.  Plan  Continue current medications.  Hyperlipidemia   LDL goal < 70  Lipid Panel     Component Value Date/Time   CHOL 202 (H) 03/26/2020 1128   TRIG 118 03/26/2020 1128   TRIG 87 06/22/2006 1013   HDL 71 03/26/2020 1128   LDLCALC 108 (H) 03/26/2020 1128   LDLDIRECT 136.2 10/25/2012 1009    Hepatic Function Latest Ref Rng & Units 03/26/2020 09/23/2019 03/21/2019  Total Protein 6.1 - 8.1 g/dL 6.8 6.7 6.7  Albumin 3.5 - 5.2 g/dL - 4.0 4.0  AST 10 - 35 U/L 22 17 17   ALT 9 - 46 U/L 15 15 17   Alk Phosphatase 39 - 117 U/L - 78 81  Total Bilirubin 0.2 - 1.2 mg/dL 0.7 0.7 0.7  Bilirubin, Direct 0.0 - 0.3 mg/dL - - -    The ASCVD Risk score (Sherwood., et al., 2013) failed to calculate for the following reasons:   The 2013 ASCVD risk score is only valid for ages 75 to 60  Ascending aortic aneurysm. Previous medications: none found on chart.  Trying to lower LDL with diet/exercise modifications, very agreeable to starting rosuvastatin if elevated at f/u. Patient is currently not at goal the following medications:  . n/a  We discussed potential therapy with rosuvastatin due to cardiac risk factors.  Plan  Continue current medications.  Post transcatheter aortic valve replacement    INR goal 2.0-3.0  Started on warfarin 05/2020. Denies any abnormal bruising, bleeding from nose or  gums or blood in urine or stool. Some questions regarding warfarin and diet. Patient is currently not at  goal on the following:   Warfarin 5 mg - 1 tablet daily except 1.5 tablets on Tuesday and Saturdays. Recheck INR in 1 week.  Reviewed diet and clarified some concerns related to dietary consistency. Reviewed vitamin K levels in foods. Handout provided Discussed OTC NSAID use - understanding to avoid NSAIDs such as ibuprofen.  Plan  Continue current medications. INR recheck 06/07/2020.  Gout    Lab Results  Component Value Date   LABURIC 4.5 03/26/2020   LABURIC 4.8 03/21/2019   LABURIC 5.2 03/30/2017   Denies any gout past several years.  Patient is currently controlled on:  . Allopurinol 300 mg once daily  Counseled on avoidance of alcohol and low purine diet.   Plan  Continue current medications  Vaccines   Immunization History  Administered Date(s) Administered  . Fluad Quad(high Dose 65+) 03/05/2020  . Influenza, High Dose Seasonal PF 03/26/2014, 04/08/2017, 03/20/2018, 02/03/2019, 03/04/2019  . Influenza,inj,Quad PF,6+ Mos 03/14/2013  . Influenza-Unspecified 04/06/2015, 03/02/2016, 02/28/2017, 03/20/2018  . PFIZER SARS-COV-2 Vaccination 07/09/2019, 07/28/2019, 03/17/2020  . Pneumococcal Conjugate-13 05/25/2014  . Pneumococcal Polysaccharide-23 06/19/2004  . Td 12/27/2007  . Tdap 05/22/2018  . Zoster 06/19/2008  . Zoster Recombinat (Shingrix) 06/19/2018, 12/26/2018   Reviewed and discussed patient's vaccination history.    Plan  No recommendations at this time.   Medication Management / Care Coordination   Receives prescription medications from:  [facility] [place], [place] [address] Phone: [phone] Fax: [phone]  [id number] [address] [address]Phone: [phone]: [phone] [place] [place] [address]Phone: [phone]: [phone]  Help with medication management/pharmacy services offered. Denies any issues at this time.   Was not aware that oral b12 had been recommended to him after injections were stopped - will pick up from pharmacy today.  Plan  Continue current medication management strategy. ___________________________ SDOH (Social Determinants of Health) assessments performed: Yes.  Future Appointments  Date Time Provider [place] 06/07/2020 10:30 AM [facility]  07/07/2020  2:00 PM [facility] ECHO 4 [facility]  09/27/2020 11:00 AM HMarin Olp MD [facility]  11/11/2020 10:30 AM [facility] LAB [facility]  11/16/2020 10:30 AM [facility]-CT 1 LBCT-CT LB-[facility]  12/07/2020  3:30 PM [facility] CCM PHARMACIST [facility] PEC   Visit follow-up:  . RPH follow-up: 6 month telephone visit.  JMadelin Rear Pharm.D., BCGP Clinical Pharmacist [facility] [phone]

## 2020-06-07 ENCOUNTER — Ambulatory Visit (INDEPENDENT_AMBULATORY_CARE_PROVIDER_SITE_OTHER): Payer: PPO

## 2020-06-07 ENCOUNTER — Other Ambulatory Visit: Payer: Self-pay

## 2020-06-07 DIAGNOSIS — Z953 Presence of xenogenic heart valve: Secondary | ICD-10-CM

## 2020-06-07 DIAGNOSIS — I8290 Acute embolism and thrombosis of unspecified vein: Secondary | ICD-10-CM

## 2020-06-07 DIAGNOSIS — Z5181 Encounter for therapeutic drug level monitoring: Secondary | ICD-10-CM

## 2020-06-07 LAB — POCT INR: INR: 1.7 — AB (ref 2.0–3.0)

## 2020-06-07 NOTE — Patient Instructions (Signed)
Description   Today take 1.5 tablets then start taking 1 tablet daily except 1.5 tablets on Tuesdays, Thursdays and Saturdays. Recheck INR in 1 week. Anticoagulation Clinic [phone] Main [phone]

## 2020-06-14 ENCOUNTER — Ambulatory Visit (INDEPENDENT_AMBULATORY_CARE_PROVIDER_SITE_OTHER): Payer: PPO | Admitting: *Deleted

## 2020-06-14 ENCOUNTER — Other Ambulatory Visit: Payer: Self-pay

## 2020-06-14 DIAGNOSIS — Z5181 Encounter for therapeutic drug level monitoring: Secondary | ICD-10-CM

## 2020-06-14 DIAGNOSIS — Z953 Presence of xenogenic heart valve: Secondary | ICD-10-CM | POA: Diagnosis not present

## 2020-06-14 DIAGNOSIS — I8290 Acute embolism and thrombosis of unspecified vein: Secondary | ICD-10-CM

## 2020-06-14 LAB — POCT INR: INR: 1.8 — AB (ref 2.0–3.0)

## 2020-06-14 NOTE — Patient Instructions (Signed)
Description   Today take 1.5 tablets then start taking 1.5 tablets daily except 1 tablet on Mondays and Friday. Recheck INR in 1 week. Anticoagulation Clinic [phone] Main [phone]

## 2020-06-21 ENCOUNTER — Other Ambulatory Visit: Payer: Self-pay

## 2020-06-21 ENCOUNTER — Ambulatory Visit (INDEPENDENT_AMBULATORY_CARE_PROVIDER_SITE_OTHER): Payer: PPO | Admitting: Pharmacist

## 2020-06-21 DIAGNOSIS — Z953 Presence of xenogenic heart valve: Secondary | ICD-10-CM

## 2020-06-21 DIAGNOSIS — Z5181 Encounter for therapeutic drug level monitoring: Secondary | ICD-10-CM | POA: Diagnosis not present

## 2020-06-21 DIAGNOSIS — I8290 Acute embolism and thrombosis of unspecified vein: Secondary | ICD-10-CM

## 2020-06-21 LAB — POCT INR: INR: 2 (ref 2.0–3.0)

## 2020-06-21 NOTE — Patient Instructions (Signed)
Description   Continue taking 1.5 tablets daily except 1 tablet on Mondays and Friday. Recheck INR in 2 week. Anticoagulation Clinic [phone] Main [phone]

## 2020-07-07 ENCOUNTER — Other Ambulatory Visit: Payer: PPO

## 2020-07-07 ENCOUNTER — Other Ambulatory Visit (HOSPITAL_COMMUNITY): Payer: PPO

## 2020-07-09 ENCOUNTER — Other Ambulatory Visit: Payer: Self-pay

## 2020-07-09 ENCOUNTER — Emergency Department (HOSPITAL_COMMUNITY): Payer: PPO

## 2020-07-09 ENCOUNTER — Emergency Department (HOSPITAL_COMMUNITY)
Admission: EM | Admit: 2020-07-09 | Discharge: 2020-07-09 | Disposition: A | Discharge status: Home / Self Care | Payer: PPO | Attending: Emergency Medicine | Admitting: Emergency Medicine

## 2020-07-09 DIAGNOSIS — Z87891 Personal history of nicotine dependence: Secondary | ICD-10-CM | POA: Insufficient documentation

## 2020-07-09 DIAGNOSIS — Z85038 Personal history of other malignant neoplasm of large intestine: Secondary | ICD-10-CM | POA: Diagnosis not present

## 2020-07-09 DIAGNOSIS — Z79899 Other long term (current) drug therapy: Secondary | ICD-10-CM | POA: Diagnosis not present

## 2020-07-09 DIAGNOSIS — I700 Atherosclerosis of aorta: Secondary | ICD-10-CM | POA: Insufficient documentation

## 2020-07-09 DIAGNOSIS — Z952 Presence of prosthetic heart valve: Secondary | ICD-10-CM | POA: Insufficient documentation

## 2020-07-09 DIAGNOSIS — I5032 Chronic diastolic (congestive) heart failure: Secondary | ICD-10-CM | POA: Diagnosis not present

## 2020-07-09 DIAGNOSIS — M549 Dorsalgia, unspecified: Secondary | ICD-10-CM | POA: Diagnosis not present

## 2020-07-09 DIAGNOSIS — I2510 Atherosclerotic heart disease of native coronary artery without angina pectoris: Secondary | ICD-10-CM | POA: Diagnosis not present

## 2020-07-09 DIAGNOSIS — I712 Thoracic aortic aneurysm, without rupture: Secondary | ICD-10-CM | POA: Diagnosis not present

## 2020-07-09 DIAGNOSIS — W000XXA Fall on same level due to ice and snow, initial encounter: Secondary | ICD-10-CM | POA: Diagnosis not present

## 2020-07-09 DIAGNOSIS — S20219A Contusion of unspecified front wall of thorax, initial encounter: Secondary | ICD-10-CM

## 2020-07-09 DIAGNOSIS — S0990XA Unspecified injury of head, initial encounter: Secondary | ICD-10-CM | POA: Insufficient documentation

## 2020-07-09 DIAGNOSIS — I110 Hypertensive heart disease with heart failure: Secondary | ICD-10-CM | POA: Diagnosis not present

## 2020-07-09 DIAGNOSIS — R0781 Pleurodynia: Secondary | ICD-10-CM | POA: Insufficient documentation

## 2020-07-09 DIAGNOSIS — J8410 Pulmonary fibrosis, unspecified: Secondary | ICD-10-CM | POA: Diagnosis not present

## 2020-07-09 DIAGNOSIS — R519 Headache, unspecified: Secondary | ICD-10-CM | POA: Diagnosis not present

## 2020-07-09 DIAGNOSIS — W19XXXA Unspecified fall, initial encounter: Secondary | ICD-10-CM

## 2020-07-09 DIAGNOSIS — M47812 Spondylosis without myelopathy or radiculopathy, cervical region: Secondary | ICD-10-CM | POA: Diagnosis not present

## 2020-07-09 DIAGNOSIS — J984 Other disorders of lung: Secondary | ICD-10-CM | POA: Diagnosis not present

## 2020-07-09 DIAGNOSIS — Z7901 Long term (current) use of anticoagulants: Secondary | ICD-10-CM | POA: Diagnosis not present

## 2020-07-09 DIAGNOSIS — S20211A Contusion of right front wall of thorax, initial encounter: Secondary | ICD-10-CM | POA: Diagnosis not present

## 2020-07-09 DIAGNOSIS — M542 Cervicalgia: Secondary | ICD-10-CM | POA: Diagnosis not present

## 2020-07-09 DIAGNOSIS — R52 Pain, unspecified: Secondary | ICD-10-CM | POA: Diagnosis not present

## 2020-07-09 LAB — PROTIME-INR
INR: 1.9 — ABNORMAL HIGH (ref 0.8–1.2)
Prothrombin Time: 20.9 seconds — ABNORMAL HIGH (ref 11.4–15.2)

## 2020-07-09 MED ORDER — ACETAMINOPHEN 325 MG PO TABS
650.0000 mg | ORAL_TABLET | Freq: Once | ORAL | Status: AC
  Administered 2020-07-09: 650 mg via ORAL
  Filled 2020-07-09: qty 2

## 2020-07-09 NOTE — ED Provider Notes (Signed)
[facility] EMERGENCY DEPARTMENT Provider Note   CSN: [id number] Arrival date & time: 07/09/20  1420     History Chief Complaint  Patient presents with  . Fall    Barry Decker is a 87 y.o. male.  87 year old male with prior medical history detailed below presents for evaluation following reported fall.  Patient reports that he was in his driveway and he slipped on the ice.  He fell backwards.  He landed hard on his right upper back.  He also struck his head.  He did not pass out.  He was not able to stand secondary to right posterior rib pain after the fall.  His neighbor called EMS.      The history is provided by the patient and medical records.  Fall This is a new problem. The current episode started less than 1 hour ago. The problem occurs rarely. The problem has not changed since onset.Pertinent negatives include no headaches and no shortness of breath. Nothing aggravates the symptoms. The symptoms are relieved by position.       Past Medical History:  Diagnosis Date  . Aortic atherosclerosis ([place])   . Aortic valve disease    a. severe aortic stenosis s/p TAVR 09/2017.  . Arthritis    "spinal" (11/16/2015)  . Atypical nevus 05/14/2013   moderate atypia - left scapula  . Colon cancer ([place])    precancer-partial colectomy  . Dilated aortic root ([place])   . Elevated HDL   . First degree AV block   . First degree AV block   . Gout   . Heart murmur   . History of kidney stones   . Hyperlipidemia   . Hypertension   . LBBB (left bundle branch block)   . Nonobstructive atherosclerosis of coronary artery   . Squamous cell skin cancer 08/04/2011   Left outer brow - CX3 + 5FU  . Thoracic aortic aneurysm [facility])     Patient Active Problem List   Diagnosis Date Noted  . Thrombus 05/31/2020  . Encounter for therapeutic drug monitoring 05/31/2020  . Ascending aortic aneurysm ([place]) 03/26/2020  . Aortic atherosclerosis ([place]) 03/26/2020  . B12 deficiency  09/23/2019  . Macrocytic anemia 03/21/2019  . 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 Kidney stones   . Chronic diastolic CHF (congestive heart failure) ([place]) 03/30/2017  . Abdominal aortic ectasia ([place]) 04/07/2016  . Osteoarthritis of cervical spine 08/31/2015  . Throat pain in adult 08/31/2015  . Osteoarthritis of left knee 05/25/2014  . HYPERCHOLESTEROLEMIA WITH HIGH HDL 01/31/2007  . Gout 01/31/2007  . Essential hypertension 01/31/2007  . History of malignant neoplasm of large intestine 01/31/2007    Past Surgical History:  Procedure Laterality Date  . BACK SURGERY    . CATARACT EXTRACTION W/ INTRAOCULAR LENS  IMPLANT, BILATERAL Bilateral 2017  . COLONOSCOPY W/ POLYPECTOMY    . FACET JOINT INJECTION  2009  . LITHOTRIPSY  2010  . LUMBAR LAMINECTOMY  ~ 1992  . PARTIAL COLECTOMY  1997   precancerours  . PARTIAL KNEE ARTHROPLASTY Left 11/16/2015   Procedure: LEFT KNEE UNICOMPARTMENTAL ;  Surgeon: [name], MD;  Location: [facility];  Service: Orthopedics;  Laterality: Left;  . REPLACEMENT UNICONDYLAR JOINT KNEE Left 11/16/2015  . RIGHT/LEFT HEART CATH AND CORONARY ANGIOGRAPHY N/A 08/10/2017   Procedure: RIGHT/LEFT HEART CATH AND CORONARY ANGIOGRAPHY;  Surgeon: [name], MD;  Location: [facility];  Service: Cardiovascular;  Laterality: N/A;  .  TEE WITHOUT CARDIOVERSION N/A 09/25/2017   Procedure: TRANSESOPHAGEAL ECHOCARDIOGRAM (TEE);  Surgeon: [name], MD;  Location: [facility];  Service: Open Heart Surgery;  Laterality: N/A;  . TONSILLECTOMY    . TRANSCATHETER AORTIC VALVE REPLACEMENT, TRANSFEMORAL N/A 09/25/2017   Procedure: TRANSCATHETER AORTIC VALVE REPLACEMENT, TRANSFEMORAL;  Surgeon: [name], MD;  Location: [facility];  Service: Open Heart Surgery;  Laterality: N/A;  . TYMPANOPLASTY Left 1998   ruptured ear drum with skin graft       Family History  Problem Relation Age of Onset   . Heart attack Mother 39       former smoker  . Heart attack Father 56       former smoker  . Hypertension Sister   . Heart attack Brother 26       Rheumatic fever    Social History   Tobacco Use  . Smoking status: Former Smoker    Packs/day: 1.00    Years: 20.00    Pack years: 20.00    Types: Cigarettes    Quit date: 09/15/1980    Years since quitting: 39.8  . Smokeless tobacco: Never Used  Vaping Use  . Vaping Use: Never used  Substance Use Topics  . Alcohol use: Yes    Alcohol/week: 7.0 standard drinks    Types: 1 Glasses of wine, 1 Shots of liquor, 5 Standard drinks or equivalent per week  . Drug use: No    Home Medications Prior to Admission medications   Medication Sig Start Date End Date Taking? Authorizing Provider  allopurinol (ZYLOPRIM) 300 MG tablet TAKE 1 TABLET BY MOUTH EVERY DAY 03/17/20   [name], MD  amLODipine (NORVASC) 10 MG tablet TAKE 1 TABLET BY MOUTH EVERY DAY 03/17/20   [name], MD  amoxicillin (AMOXIL) 500 MG tablet Take 4 tablets (2,000 mg total) by mouth as directed. 1 hour prior to dental work including cleanings 05/03/20   Dunn, Nedra Hai, PA-C  furosemide (LASIX) 20 MG tablet Take 1 tablet (20 mg total) by mouth daily. 04/28/20   [name], MD  hydrochlorothiazide (MICROZIDE) 12.5 MG capsule Take 1 capsule (12.5 mg total) by mouth daily. 05/03/20 08/01/20  Dunn, Nedra Hai, PA-C  IRON PO Take 65 mg/day by mouth daily.     [provider]  potassium citrate (UROCIT-K) 10 MEQ (1080 MG) SR tablet Take 1 tablet (10 mEq total) by mouth 3 (three) times daily with meals. 07/04/13   Swords, Darrick Penna, MD  quinapril (ACCUPRIL) 40 MG tablet Take 1 tablet (40 mg total) by mouth daily. 06/01/20   [name], MD  warfarin (COUMADIN) 5 MG tablet Take 1 tablet (5 mg total) by mouth daily. 05/25/20 05/25/21  Eileen Stanford, PA-C    Allergies    Patient has no known allergies.  Review of Systems   Review of Systems   Respiratory: Negative for shortness of breath.   Neurological: Negative for headaches.  All other systems reviewed and are negative.   Physical Exam Updated Vital Signs BP (!) 148/85   Pulse 65   Temp 97.8 F (36.6 C) (Oral)   Resp 17   SpO2 98%   Physical Exam Vitals and nursing note reviewed.  Constitutional:      General: He is not in acute distress.    Appearance: Normal appearance. He is well-developed and well-nourished.  HENT:     Head: Normocephalic and atraumatic.     Right Ear: Ear canal and  external ear normal.     Left Ear: Ear canal and external ear normal.     Nose: Nose normal.     Mouth/Throat:     Mouth: Oropharynx is clear and moist. Mucous membranes are moist.  Eyes:     Extraocular Movements: EOM normal.     Conjunctiva/sclera: Conjunctivae normal.     Pupils: Pupils are equal, round, and reactive to light.  Cardiovascular:     Rate and Rhythm: Normal rate and regular rhythm.     Heart sounds: Normal heart sounds.  Pulmonary:     Effort: Pulmonary effort is normal. No respiratory distress.     Breath sounds: Normal breath sounds.  Abdominal:     General: There is no distension.     Palpations: Abdomen is soft.     Tenderness: There is no abdominal tenderness.  Musculoskeletal:        General: No deformity or edema. Normal range of motion.     Cervical back: Normal range of motion and neck supple.     Comments: Moderate tenderness to palpation over the right posterior lateral ribs.  No ecchymosis noted.  No crepitus noted.  No midline back pain tenderness.  Skin:    General: Skin is warm and dry.  Neurological:     General: No focal deficit present.     Mental Status: He is alert and oriented to person, place, and time.  Psychiatric:        Mood and Affect: Mood and affect normal.     ED Results / Procedures / Treatments   Labs (all labs ordered are listed, but only abnormal results are displayed) Labs Reviewed  PROTIME-INR - Abnormal;  Notable for the following components:      Result Value   Prothrombin Time 20.9 (*)    INR 1.9 (*)    All other components within normal limits    EKG None  Radiology DG Ribs Unilateral W/Chest Right  Result Date: 07/09/2020 CLINICAL DATA:  Fall, right rib pain EXAM: RIGHT RIBS AND CHEST - 3+ VIEW COMPARISON:  CT chest dated 05/17/2020 FINDINGS: Lungs are essentially clear, noting minimal left basilar atelectasis. No focal consolidation. No pleural effusion or pneumothorax. The heart is normal in size. No displaced right rib fracture is seen. Degenerative changes of the visualized thoracolumbar spine. IMPRESSION: No evidence of acute cardiopulmonary disease. No displaced right rib fracture is seen. Electronically Signed   By: Julian Hy M.D.   On: 07/09/2020 15:11    Procedures Procedures (including critical care time)  Medications Ordered in ED Medications  acetaminophen (TYLENOL) tablet 650 mg (has no administration in time range)    ED Course  I have reviewed the triage vital signs and the nursing notes.  Pertinent labs & imaging results that were available during my care of the patient were reviewed by me and considered in my medical decision making (see chart for details).    MDM Rules/Calculators/A&P                          MDM  Screen complete  Barry Decker was evaluated in Emergency Department on 07/09/2020 for the symptoms described in the history of present illness. He was evaluated in the context of the global COVID-19 pandemic, which necessitated consideration that the patient might be at risk for infection with the SARS-CoV-2 virus that causes COVID-19. Institutional protocols and algorithms that pertain to the evaluation of patients at risk for  COVID-19 are in a state of rapid change based on information released by regulatory bodies including the CDC and federal and state organizations. These policies and algorithms were followed during the patient's care  in the ED.  Patient is presenting for evaluation following fall.  Patient slipped on ice.  He has pain to the right posterior thorax.  He also struck his head and is currently taking Coumadin.  Plain films of the right ribs did not show fracture.  CT imaging is pending.  Patient's case discussed with and signed out to Dr. Maryan Rued.  Final Clinical Impression(s) / ED Diagnoses Final diagnoses:  Fall, initial encounter    Rx / DC Orders ED Discharge Orders    None       Valarie Merino, MD 07/10/20 1313

## 2020-07-09 NOTE — ED Triage Notes (Signed)
BIB [facility] after neighbor called to report that pt had fallen on drive way. Pt c/o of upper cervical and thoracic pain. Pt is currently on Coumadin.

## 2020-07-09 NOTE — Discharge Instructions (Addendum)
Please return for any problem.   No sign of rib fractures on the imaging and no evidence of head injury.  Your Coumadin level was low at 1.9 and you need to call the Coumadin clinic for instructions on how they want you to change your Coumadin.  They may want you to wait before you get your level rechecked on Monday.  You can try using Voltaren gel or lidocaine patches which are both over-the-counter to help with the pain in addition to Tylenol.  You will be very sore over the next few days so take it easy.  If you start having severe shortness of breath, severe pain that makes unable for you to get out of bed or walk you should return to the emergency room.

## 2020-07-09 NOTE — ED Provider Notes (Addendum)
Assumed care at 4:30 PM from Dr. Francia Greaves.  Patient here after falling on the ice at home.  Patient's head CT is negative for acute injury and C-spine shows extensive multilevel cervical spondylosis and facet hypertrophy but no acute fractures.  CT of the chest was negative for any acute rib fractures.  It did show a stable dilation of the ascending aorta which was similar to prior exams and recommended semiannual follow-up.  Pt's care team is already aware of this.  Patient's C-spine was cleared.  He will try Voltaren gel or lidocaine patches to help with the pain as well as Tylenol.  He is otherwise stable for discharge.  Also patient's INR today is subtherapeutic at 1.9.  He will call the Coumadin clinic for adjustments.   Blanchie Dessert, MD 07/09/20 Erskin Burnet    Blanchie Dessert, MD 07/09/20 [place], MD 07/09/20 [id number]

## 2020-07-12 ENCOUNTER — Ambulatory Visit (INDEPENDENT_AMBULATORY_CARE_PROVIDER_SITE_OTHER): Payer: PPO | Admitting: Interventional Cardiology

## 2020-07-12 DIAGNOSIS — Z5181 Encounter for therapeutic drug level monitoring: Secondary | ICD-10-CM

## 2020-07-12 NOTE — Patient Instructions (Addendum)
Description    Start taking 1.5 tablets daily except for 1 tablet on Fridays. Recheck INR In 1 week.  Anticoagulation Clinic [phone] Main [phone]

## 2020-07-19 ENCOUNTER — Other Ambulatory Visit: Payer: Self-pay

## 2020-07-19 ENCOUNTER — Ambulatory Visit (INDEPENDENT_AMBULATORY_CARE_PROVIDER_SITE_OTHER): Payer: PPO

## 2020-07-19 DIAGNOSIS — Z953 Presence of xenogenic heart valve: Secondary | ICD-10-CM | POA: Diagnosis not present

## 2020-07-19 DIAGNOSIS — Z5181 Encounter for therapeutic drug level monitoring: Secondary | ICD-10-CM | POA: Diagnosis not present

## 2020-07-19 DIAGNOSIS — I8290 Acute embolism and thrombosis of unspecified vein: Secondary | ICD-10-CM | POA: Diagnosis not present

## 2020-07-19 LAB — POCT INR: INR: 2.5 (ref 2.0–3.0)

## 2020-07-19 MED ORDER — WARFARIN SODIUM 5 MG PO TABS: ORAL_TABLET | ORAL | 1 refills | Status: DC

## 2020-07-19 NOTE — Patient Instructions (Signed)
Description   Continue on same dosage 1.5 tablets daily except for 1 tablet on Fridays. Recheck INR in 2 weeks.  Anticoagulation Clinic [phone] Main [phone]

## 2020-07-23 ENCOUNTER — Telehealth: Payer: Self-pay

## 2020-07-23 NOTE — Chronic Care Management (AMB) (Signed)
Chronic Care Management Pharmacy Assistant   Name: Barry Decker  MRN: [id number] DOB: [birth year]  Reason for Encounter: Disease State/General Adherence Call   PCP : [name], MD  Allergies:  No Known Allergies  Medications: Outpatient Encounter Medications as of 07/23/2020  Medication Sig  . allopurinol (ZYLOPRIM) 300 MG tablet TAKE 1 TABLET BY MOUTH EVERY DAY (Patient taking differently: Take 300 mg by mouth daily.)  . amLODipine (NORVASC) 10 MG tablet TAKE 1 TABLET BY MOUTH EVERY DAY (Patient taking differently: Take 10 mg by mouth in the morning.)  . amoxicillin (AMOXIL) 500 MG tablet Take 4 tablets (2,000 mg total) by mouth as directed. 1 hour prior to dental work including cleanings (Patient taking differently: Take 2,000 mg by mouth See admin instructions. Take 2,000 mg by mouth one hour prior to dental work, including cleanings)  . diphenhydrAMINE (BENADRYL) 25 mg capsule Take 25 mg by mouth at bedtime as needed for sleep.  . furosemide (LASIX) 20 MG tablet Take 1 tablet (20 mg total) by mouth daily.  . hydrochlorothiazide (MICROZIDE) 12.5 MG capsule Take 1 capsule (12.5 mg total) by mouth daily. (Patient taking differently: Take 12.5 mg by mouth daily at 12 noon.)  . potassium citrate (UROCIT-K) 10 MEQ (1080 MG) SR tablet Take 1 tablet (10 mEq total) by mouth 3 (three) times daily with meals.  . quinapril (ACCUPRIL) 40 MG tablet Take 1 tablet (40 mg total) by mouth daily.  Marland Kitchen warfarin (COUMADIN) 5 MG tablet Take 1 to 1.5 tablets once daily as directed by Anticoagulation Clinic.   No facility-administered encounter medications on file as of 07/23/2020.    Current Diagnosis: Patient Active Problem List   Diagnosis Date Noted  . Thrombus 05/31/2020  . Encounter for therapeutic drug monitoring 05/31/2020  . Ascending aortic aneurysm ([place]) 03/26/2020  . Aortic atherosclerosis ([place]) 03/26/2020  . B12 deficiency 09/23/2019  . Macrocytic anemia 03/21/2019  . 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 Kidney stones   . Chronic diastolic CHF (congestive heart failure) ([place]) 03/30/2017  . Abdominal aortic ectasia ([place]) 04/07/2016  . Osteoarthritis of cervical spine 08/31/2015  . Throat pain in adult 08/31/2015  . Osteoarthritis of left knee 05/25/2014  . HYPERCHOLESTEROLEMIA WITH HIGH HDL 01/31/2007  . Gout 01/31/2007  . Essential hypertension 01/31/2007  . History of malignant neoplasm of large intestine 01/31/2007    Have you seen any other providers since your last visit with Madelin Rear, Pharm.D., BCGP?  01/21/202 ED Blanchie Dessert, MD - Fall initial encounter, contusion of rib.  Have you had any problems recently with your health?  Patient states he is still healing from a recent fall he had. Patient states he went outside and slipped and fell on the ice. Patient states he still has some soreness in his ribs.  Have you had any problems with your pharmacy?  Patient states he has not had any problems recently with his pharmacy.  What issues or side effects are you having with your medications?  Patient states he is not currently having any issues or side effects from any of his medications.  What would you like me to pass along to Madelin Rear, [place].D., BCGP for them to help you with?   Patient states he doesn't have anything for me to pass along at this time.  What can we do to take care of you better?  Patient states "If I think of anything I will give you a  call".  Patient states he is currently checking his blood pressure about once a week at home. Patient states his blood pressure averages around 120's-130's/60.  April D Calhoun, [place] Pharmacist Assistant [phone]    Follow-Up:  Pharmacist Review

## 2020-07-28 ENCOUNTER — Ambulatory Visit (HOSPITAL_COMMUNITY): Payer: PPO | Attending: Cardiovascular Disease

## 2020-07-28 ENCOUNTER — Other Ambulatory Visit: Payer: Self-pay

## 2020-07-28 DIAGNOSIS — Z952 Presence of prosthetic heart valve: Secondary | ICD-10-CM | POA: Diagnosis not present

## 2020-07-28 LAB — ECHOCARDIOGRAM COMPLETE
AR max vel: 2.4 cm2
AV Area VTI: 2.61 cm2
AV Area mean vel: 2.39 cm2
AV Mean grad: 10 mmHg
AV Peak grad: 17.8 mmHg
Ao pk vel: 2.11 m/s
S' Lateral: 3.2 cm

## 2020-08-02 ENCOUNTER — Other Ambulatory Visit: Payer: Self-pay

## 2020-08-02 ENCOUNTER — Ambulatory Visit (INDEPENDENT_AMBULATORY_CARE_PROVIDER_SITE_OTHER): Payer: PPO

## 2020-08-02 DIAGNOSIS — Z5181 Encounter for therapeutic drug level monitoring: Secondary | ICD-10-CM | POA: Diagnosis not present

## 2020-08-02 DIAGNOSIS — I8290 Acute embolism and thrombosis of unspecified vein: Secondary | ICD-10-CM | POA: Diagnosis not present

## 2020-08-02 DIAGNOSIS — Z953 Presence of xenogenic heart valve: Secondary | ICD-10-CM

## 2020-08-02 LAB — POCT INR: INR: 2.7 (ref 2.0–3.0)

## 2020-08-02 NOTE — Patient Instructions (Signed)
Continue on same dosage 1.5 tablets daily except for 1 tablet on Fridays. Recheck INR in 3 weeks.   Anticoagulation Clinic [phone] Main [phone]

## 2020-08-03 ENCOUNTER — Other Ambulatory Visit: Payer: Self-pay | Admitting: Physician Assistant

## 2020-08-03 DIAGNOSIS — Z952 Presence of prosthetic heart valve: Secondary | ICD-10-CM

## 2020-08-23 ENCOUNTER — Ambulatory Visit (INDEPENDENT_AMBULATORY_CARE_PROVIDER_SITE_OTHER): Payer: PPO | Admitting: *Deleted

## 2020-08-23 ENCOUNTER — Other Ambulatory Visit: Payer: Self-pay

## 2020-08-23 DIAGNOSIS — Z953 Presence of xenogenic heart valve: Secondary | ICD-10-CM

## 2020-08-23 DIAGNOSIS — Z5181 Encounter for therapeutic drug level monitoring: Secondary | ICD-10-CM

## 2020-08-23 DIAGNOSIS — I8290 Acute embolism and thrombosis of unspecified vein: Secondary | ICD-10-CM

## 2020-08-23 LAB — POCT INR: INR: 2.3 (ref 2.0–3.0)

## 2020-08-23 NOTE — Patient Instructions (Addendum)
Description   Continue on same dosage 1.5 tablets daily except for 1 tablet on Fridays. Recheck INR in 5 weeks.   Anticoagulation Clinic [phone] Main [phone]  Addendum: Called and spoke to pt, informed him to stop taking warfarin and to start taking a baby Asprin per Nell Range.

## 2020-08-23 NOTE — Progress Notes (Signed)
Message Received: Today Eileen Stanford, PA-C  Lutricia Feil, RN Yes please!  We are going to watch his valve off coumadin. He needs to start a baby aspirin   THank you  KT        Previous Messages   ----- Message -----  From: Lutricia Feil, RN  Sent: 08/23/2020 10:12 AM EST  To: Eileen Stanford, PA-C   Tiajuana Amass,   I saw pt today at coumadin visit, on appointment note it states for him to stop warfarin TODAY. I was just wanting to clarify with you before I stopped his warfarin today. Thank you so much!   [facility]

## 2020-08-24 ENCOUNTER — Telehealth: Payer: Self-pay

## 2020-08-24 NOTE — Telephone Encounter (Signed)
See below, ok for SDA or another provider?

## 2020-08-24 NOTE — Telephone Encounter (Signed)
For acute issues or worsening issues-I am fine with using same-day slots-you can use one for this

## 2020-08-24 NOTE — Telephone Encounter (Signed)
Pt fell back a month and a half ago. He went to [facility] ED and they didn't see anything broken. Pt is still having pain that worsens when he moves certain ways and wants to be seen. No open slots til May. Please advise how to schedule

## 2020-08-25 ENCOUNTER — Other Ambulatory Visit: Payer: Self-pay | Admitting: *Deleted

## 2020-08-25 NOTE — Telephone Encounter (Signed)
See below

## 2020-08-26 NOTE — Patient Instructions (Addendum)
Please stop by x-ray before you go If you have mychart- we will send your results within 3 business days of [place] receiving them.  If you do not have mychart- we will call you about results within 5 business days of [place] receiving them.   If no fractures consider prednisone as antiinflammatory- if that doesn't help lets get you into [facility]

## 2020-08-26 NOTE — Progress Notes (Signed)
Phone [phone] In person visit   Subjective:   Barry Decker is a 87 y.o. year old very pleasant male patient who presents for/with See problem oriented charting Chief Complaint  Patient presents with  . Fall    Hit his back The ending of January. Patient states that he slipped on the ice and feel on his back. Patient states that he's been in pain since he fell. Patient states that the pain is worst when he go from laying down to standing up. Was seen in the ED    This visit occurred during the SARS-CoV-2 public health emergency.  Safety protocols were in place, including screening questions prior to the visit, additional usage of staff PPE, and extensive cleaning of exam room while observing appropriate contact time as indicated for disinfecting solutions.   Past Medical History-  Patient Active Problem List   Diagnosis Date Noted  . Ascending aortic aneurysm ([place]) 03/26/2020    Priority: High  . 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 Aortic atherosclerosis ([place]) 03/26/2020    Priority: Medium  . B12 deficiency 09/23/2019    Priority: Medium  . Chronic diastolic CHF (congestive heart failure) ([place]) 03/30/2017    Priority: Medium  . Abdominal aortic ectasia ([place]) 04/07/2016    Priority: Medium  . HYPERCHOLESTEROLEMIA WITH HIGH HDL 01/31/2007    Priority: Medium  . Gout 01/31/2007    Priority: Medium  . Essential hypertension 01/31/2007    Priority: Medium  . History of malignant neoplasm of large intestine 01/31/2007    Priority: Medium  . Osteoarthritis of cervical spine 08/31/2015    Priority: Low  . Osteoarthritis of left knee 05/25/2014    Priority: Low  . Thrombus 05/31/2020  . Encounter for therapeutic drug monitoring 05/31/2020  . Macrocytic anemia 03/21/2019  . Kidney stones   . Throat pain in adult 08/31/2015    Medications- reviewed and updated Current Outpatient Medications   Medication Sig Dispense Refill  . allopurinol (ZYLOPRIM) 300 MG tablet TAKE 1 TABLET BY MOUTH EVERY DAY (Patient taking differently: Take 300 mg by mouth daily.) 90 tablet 3  . amLODipine (NORVASC) 10 MG tablet TAKE 1 TABLET BY MOUTH EVERY DAY (Patient taking differently: Take 10 mg by mouth in the morning.) 90 tablet 3  . amoxicillin (AMOXIL) 500 MG tablet Take 4 tablets (2,000 mg total) by mouth as directed. 1 hour prior to dental work including cleanings (Patient taking differently: Take 2,000 mg by mouth See admin instructions. Take 2,000 mg by mouth one hour prior to dental work, including cleanings) 8 tablet 2  . diphenhydrAMINE (BENADRYL) 25 mg capsule Take 25 mg by mouth at bedtime as needed for sleep.    . furosemide (LASIX) 20 MG tablet Take 1 tablet (20 mg total) by mouth daily. 90 tablet 3  . hydrochlorothiazide (MICROZIDE) 12.5 MG capsule Take 1 capsule (12.5 mg total) by mouth daily. (Patient taking differently: Take 12.5 mg by mouth daily at 12 noon.) 90 capsule 3  . potassium citrate (UROCIT-K) 10 MEQ (1080 MG) SR tablet Take 1 tablet (10 mEq total) by mouth 3 (three) times daily with meals. 300 tablet 1  . quinapril (ACCUPRIL) 40 MG tablet Take 1 tablet (40 mg total) by mouth daily. 90 tablet 3   No current facility-administered medications for this visit.     Objective:  BP 122/68   Pulse 76   Temp 97.6 F (36.4 C) (Temporal)  Ht 6' (1.829 m)   Wt 229 lb 9.6 oz (104.1 kg)   SpO2 95%   BMI 31.14 kg/m   CV: RRR  Lungs: nonlabored, normal respiratory rate Abdomen: soft/nontender/nondistended/normal bowel sounds. No rebound or guarding.  Skin: warm, dry MSK: Good range of motion but does get some pain with extending back.  Able to get close to touching toes.  With palpation no midline pain of thoracic spine and no pain with palpation of paraspinous muscles-this can be recreated by twisting or bending certain ways Neuro: Good strength in lower extremities    Assessment  and Plan  #Fell and Hit back/thoracic back pain S: Patient fell and hit his back at the end of January.  He slipped on ice at that time and fell backwards onto his back.  Has been seen in the emergency room on 07/09/2020-patient did have CT of his head negative for acute injury.  CT of cervical spine showed extensive multilevel cervical spondylosis and facet hypertrophy but no acute fractures.  Also had CT of the chest negative for any acute rib fractures.  Cervical spine was cleared to remove C spine collar  Since the fall most of the head/neck pain went away. Has had persistent pain in midback. If twists the wrong way gets shooting pain. Had to sleep in recliner to prevent him from turning back and forth- he is back in bed now but still getting some pain with movement. Worst pain is standing up after laying down in the bed. If twists or coughs can get shooting pain. Started sleeping in bed just 4 nights ago and in excruciating pain when stands up. Once he gets up hes ok but the position change is hard. Pain can be severe. Some constipation but unchanged from baseline  Heating pad some help.  ROS-No saddle anesthesia, bladder incontinence, fecal incontinence, weakness in extremity, numbness or tingling in extremity. history of cancer, fever, chills, unintentional weight loss, recent bacterial infection, recent IV drug use, HIV, pain worse at night or while supine. No chest pain or shortness of breath A/P: 87 year old male with thoracic back pain after a fall in January-we will get thoracic spine films to rule out compression fracture.  If this is negative try prednisone-want to be cautious with NSAIDs at his age and potential cardiac risk.  We also discussed referral to PT-he does not want to pursue that before seeing orthopedics doorstep after prednisone would be referral to [facility] who he is seen in the past-Dr. Fredonia Highland.   Recommended follow up: Keep regular April visit unless needed  sooner for acute concerns such as new or worsening symptoms Future Appointments  Date Time Provider [place]  08/27/2020  2:10 PM [facility] - HPC DG 1 [facility]-HPCIMG None  09/27/2020 11:00 AM [name], MD [facility] PEC  11/11/2020 10:30 AM [facility]  11/16/2020 10:30 AM LB[facility]-CT 1 LB[facility]-CT LB-CT [facility]  12/01/2020  2:00 PM [facility]-CV CH ECHO 4 [facility]SITE[facility]ECHO [facility]  12/07/2020  3:30 PM [facility] CCM PHARMACIST [facility] PEC    Lab/Order associations:   ICD-10-CM   1. Chronic bilateral thoracic back pain  M54.6 DG Thoracic Spine W/Swimmers   G89.29    Time Spent: 22 minutes of total time (1:48 PM- 2:10 PM) was spent on the date of the encounter performing the following actions: chart review prior to seeing the patient, obtaining history, performing a medically necessary exam, counseling on the treatment plan, placing orders, and documenting in our EHR.  Return precautions advised.  Garret Reddish, MD

## 2020-08-27 ENCOUNTER — Ambulatory Visit (INDEPENDENT_AMBULATORY_CARE_PROVIDER_SITE_OTHER): Payer: PPO

## 2020-08-27 ENCOUNTER — Encounter: Payer: Self-pay | Admitting: Family Medicine

## 2020-08-27 ENCOUNTER — Other Ambulatory Visit: Payer: Self-pay

## 2020-08-27 ENCOUNTER — Ambulatory Visit (INDEPENDENT_AMBULATORY_CARE_PROVIDER_SITE_OTHER): Payer: PPO | Admitting: Family Medicine

## 2020-08-27 VITALS — BP 122/68 | HR 76 | Temp 97.6°F | Ht 72.0 in | Wt 229.6 lb

## 2020-08-27 DIAGNOSIS — G8929 Other chronic pain: Secondary | ICD-10-CM

## 2020-08-27 DIAGNOSIS — M546 Pain in thoracic spine: Secondary | ICD-10-CM

## 2020-08-27 DIAGNOSIS — M2578 Osteophyte, vertebrae: Secondary | ICD-10-CM | POA: Diagnosis not present

## 2020-08-30 ENCOUNTER — Other Ambulatory Visit: Payer: Self-pay | Admitting: Family Medicine

## 2020-08-30 MED ORDER — PREDNISONE 20 MG PO TABS: ORAL_TABLET | ORAL | 0 refills | Status: DC

## 2020-09-10 ENCOUNTER — Encounter: Payer: Self-pay | Admitting: Family Medicine

## 2020-09-23 NOTE — Patient Instructions (Addendum)
Please stop by lab before you go If you have mychart- we will send your results within 3 business days of [place] receiving them.  If you do not have mychart- we will call you about results within 5 business days of [place] receiving them.  *please also note that you will see labs on mychart as soon as they post. I will later go in and write notes on them- will say "notes from Dr. Yong Channel"  No changes today unless labs lead [place] to make changes  Recommended follow up: Return in about 6 months (around 03/29/2021) for physical or sooner if needed.

## 2020-09-23 NOTE — Progress Notes (Signed)
Phone [phone] In person visit   Subjective:   Barry Decker is a 87 y.o. year old very pleasant male patient who presents for/with See problem oriented charting Chief Complaint  Patient presents with  . Gout  . Hyperlipidemia  . B12 deficency   This visit occurred during the SARS-CoV-2 public health emergency.  Safety protocols were in place, including screening questions prior to the visit, additional usage of staff PPE, and extensive cleaning of exam room while observing appropriate contact time as indicated for disinfecting solutions.   Past Medical History-  Patient Active Problem List   Diagnosis Date Noted  . Ascending aortic aneurysm ([place]) 03/26/2020    Priority: High  . 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 Aortic atherosclerosis ([place]) 03/26/2020    Priority: Medium  . B12 deficiency 09/23/2019    Priority: Medium  . Chronic diastolic CHF (congestive heart failure) ([place]) 03/30/2017    Priority: Medium  . Abdominal aortic ectasia ([place]) 04/07/2016    Priority: Medium  . HYPERCHOLESTEROLEMIA WITH HIGH HDL 01/31/2007    Priority: Medium  . Gout 01/31/2007    Priority: Medium  . Essential hypertension 01/31/2007    Priority: Medium  . History of malignant neoplasm of large intestine 01/31/2007    Priority: Medium  . Osteoarthritis of cervical spine 08/31/2015    Priority: Low  . Osteoarthritis of left knee 05/25/2014    Priority: Low  . Thrombus 05/31/2020  . Encounter for therapeutic drug monitoring 05/31/2020  . Macrocytic anemia 03/21/2019  . Kidney stones   . Throat pain in adult 08/31/2015    Medications- reviewed and updated Current Outpatient Medications  Medication Sig Dispense Refill  . allopurinol (ZYLOPRIM) 300 MG tablet TAKE 1 TABLET BY MOUTH EVERY DAY (Patient taking differently: Take 300 mg by mouth daily.) 90 tablet 3  . amLODipine (NORVASC) 10 MG tablet TAKE 1 TABLET  BY MOUTH EVERY DAY (Patient taking differently: Take 10 mg by mouth in the morning.) 90 tablet 3  . amoxicillin (AMOXIL) 500 MG tablet Take 4 tablets (2,000 mg total) by mouth as directed. 1 hour prior to dental work including cleanings (Patient taking differently: Take 2,000 mg by mouth See admin instructions. Take 2,000 mg by mouth one hour prior to dental work, including cleanings) 8 tablet 2  . diphenhydrAMINE (BENADRYL) 25 mg capsule Take 25 mg by mouth at bedtime as needed for sleep.    . furosemide (LASIX) 20 MG tablet Take 1 tablet (20 mg total) by mouth daily. 90 tablet 3  . potassium citrate (UROCIT-K) 10 MEQ (1080 MG) SR tablet Take 1 tablet (10 mEq total) by mouth 3 (three) times daily with meals. 300 tablet 1  . quinapril (ACCUPRIL) 40 MG tablet Take 1 tablet (40 mg total) by mouth daily. 90 tablet 3  . hydrochlorothiazide (MICROZIDE) 12.5 MG capsule Take 1 capsule (12.5 mg total) by mouth daily. (Patient taking differently: Take 12.5 mg by mouth daily at 12 noon.) 90 capsule 3   No current facility-administered medications for this visit.     Objective:  BP 122/60   Pulse 62   Temp 97.6 F (36.4 C) (Temporal)   Ht 6' (1.829 m)   Wt 228 lb (103.4 kg)   SpO2 96%   BMI 30.92 kg/m  Gen: NAD, resting comfortably CV: RRR no murmurs rubs or gallops Lungs: CTAB no crackles, wheeze, rhonchi  Ext: trace edema Skin: warm, dry  Assessment and Plan   #Thoracic back pain after fall in March-is responded well to prednisone.  No acute fractures on x-ray.  Today he reports much better- some occasional pain in certain positions but tolerable.    #Status TAVR with bioprosthesis following with Dr. Angelena Form -due to severe aortic stenosis.  Patient continues to do well post surgery.  Cardiology recommends dental prophylaxis.  Continue cardiology follow-up- visit in November 2021- and planned again 6 months.    #Prior aortic aneurysm ascending has also been stable with last check January 23, 2019-we referred him back to cardiology last visit-he saw cardiology and is scheduled for follow-up examination 11/16/2020  #Chronic diastolic CHF S: Patient with stable baseline shortness of breath.  No increased edema or shortness of breath.  Weight up slightly from November but only 2 pounds  On lasix 20 mg daily  A/P: reasonable control- continue current meds   #Abdominal aortic ectasia-this was noted during urology work-up in October 2017-5-year follow-up recommended-we will repeat at next visit   #hyperlipidemia/aortic atherosclerosis S: Medication:None-patient is wanted to work on diet and exercise instead of starting medicine Lab Results  Component Value Date   CHOL 202 (H) 03/26/2020   HDL 71 03/26/2020   LDLCALC 108 (H) 03/26/2020   LDLDIRECT 136.2 10/25/2012   TRIG 118 03/26/2020   CHOLHDL 2.8 03/26/2020   A/P: Goal LDL given aortic atherosclerosis would be under 70-if not at goal by October consider medication   #Gout S: 0 flares in last 6 months on allopurinol 300Mg  Lab Results  Component Value Date   LABURIC 4.5 03/26/2020  A/P: Stable. Continue current medications.     # B12 deficiency S: Current treatment/medication (oral vs. IM): Injection previously but wanted to try oral Lab Results  Component Value Date   VITAMINB12 498 03/26/2020  A/P: hopefully controlled- update b12 today   #hypertension S: medication: Quinapril 40Mg , HCTZ 12.5Mg , Amlodpine 10Mg , lasix 20 mg BP Readings from Last 3 Encounters:  09/27/20 122/60  08/27/20 122/68  07/09/20 (!) 130/117  A/P: Stable. Continue current medications.   #Constipation-doing well on miralax as needed  #Joint pain/back pain-he mainly tolerates this-very sparing Aleve in past (not using now-needs to be cautious with blood pressure   #Macrocytic anemia-with B12 deficiency in the past but persisted even with repletion of B12.  We will get pathology smear review with blood work today  Recommended follow up:  Return in about 6 months (around 03/29/2021) for physical or sooner if needed. Future Appointments  Date Time Provider [place]  11/11/2020 10:30 AM [facility]  11/16/2020 10:30 AM LBCT-CT 1 LBCT-CT LB-CT [facility]  12/01/2020  2:00 PM MC-CV CH ECHO 4 [facility]  12/07/2020  3:30 PM [facility] CCM PHARMACIST [facility] PEC    Lab/Order associations:   ICD-10-CM   1. Essential hypertension  I10   2. Idiopathic chronic gout of multiple sites without tophus  M1A.09X0   3. B12 deficiency  E53.8   4. Macrocytic anemia  D53.9   5. Chronic diastolic CHF (congestive heart failure) (HCC) Chronic I50.32   6. Ascending aortic aneurysm (HCC) Chronic I71.2     No orders of the defined types were placed in this encounter.  Return precautions advised.  Garret Reddish, MD

## 2020-09-27 ENCOUNTER — Ambulatory Visit (INDEPENDENT_AMBULATORY_CARE_PROVIDER_SITE_OTHER): Payer: PPO | Admitting: Family Medicine

## 2020-09-27 ENCOUNTER — Encounter: Payer: Self-pay | Admitting: Family Medicine

## 2020-09-27 ENCOUNTER — Other Ambulatory Visit: Payer: Self-pay

## 2020-09-27 VITALS — BP 122/60 | HR 62 | Temp 97.6°F | Ht 72.0 in | Wt 228.0 lb

## 2020-09-27 DIAGNOSIS — I5032 Chronic diastolic (congestive) heart failure: Secondary | ICD-10-CM | POA: Diagnosis not present

## 2020-09-27 DIAGNOSIS — I7121 Aneurysm of the ascending aorta, without rupture: Secondary | ICD-10-CM

## 2020-09-27 DIAGNOSIS — E538 Deficiency of other specified B group vitamins: Secondary | ICD-10-CM

## 2020-09-27 DIAGNOSIS — D539 Nutritional anemia, unspecified: Secondary | ICD-10-CM | POA: Diagnosis not present

## 2020-09-27 DIAGNOSIS — I10 Essential (primary) hypertension: Secondary | ICD-10-CM | POA: Diagnosis not present

## 2020-09-27 DIAGNOSIS — I712 Thoracic aortic aneurysm, without rupture: Secondary | ICD-10-CM

## 2020-09-27 DIAGNOSIS — M1A09X0 Idiopathic chronic gout, multiple sites, without tophus (tophi): Secondary | ICD-10-CM | POA: Diagnosis not present

## 2020-09-27 LAB — CBC WITH DIFFERENTIAL/PLATELET
Basophils Absolute: 0 10*3/uL (ref 0.0–0.1)
Basophils Relative: 0.7 % (ref 0.0–3.0)
Eosinophils Absolute: 0.1 10*3/uL (ref 0.0–0.7)
Eosinophils Relative: 3.6 % (ref 0.0–5.0)
HCT: 39.7 % (ref 39.0–52.0)
Hemoglobin: 13.2 g/dL (ref 13.0–17.0)
Lymphocytes Relative: 32 % (ref 12.0–46.0)
Lymphs Abs: 1.1 10*3/uL (ref 0.7–4.0)
MCHC: 33.3 g/dL (ref 30.0–36.0)
MCV: 108.9 fl — ABNORMAL HIGH (ref 78.0–100.0)
Monocytes Absolute: 0.4 10*3/uL (ref 0.1–1.0)
Monocytes Relative: 12.1 % — ABNORMAL HIGH (ref 3.0–12.0)
Neutro Abs: 1.8 10*3/uL (ref 1.4–7.7)
Neutrophils Relative %: 51.6 % (ref 43.0–77.0)
Platelets: 206 10*3/uL (ref 150.0–400.0)
RBC: 3.65 Mil/uL — ABNORMAL LOW (ref 4.22–5.81)
RDW: 15.9 % — ABNORMAL HIGH (ref 11.5–15.5)
WBC: 3.4 10*3/uL — ABNORMAL LOW (ref 4.0–10.5)

## 2020-09-27 LAB — COMPREHENSIVE METABOLIC PANEL
ALT: 13 U/L (ref 0–53)
AST: 16 U/L (ref 0–37)
Albumin: 3.7 g/dL (ref 3.5–5.2)
Alkaline Phosphatase: 100 U/L (ref 39–117)
BUN: 15 mg/dL (ref 6–23)
CO2: 30 mEq/L (ref 19–32)
Calcium: 9.3 mg/dL (ref 8.4–10.5)
Chloride: 105 mEq/L (ref 96–112)
Creatinine, Ser: 1.05 mg/dL (ref 0.40–1.50)
GFR: 64.64 mL/min (ref >60.00)
Glucose, Bld: 87 mg/dL (ref 70–99)
Potassium: 4.7 mEq/L (ref 3.5–5.1)
Sodium: 140 mEq/L (ref 135–145)
Total Bilirubin: 0.5 mg/dL (ref 0.2–1.2)
Total Protein: 6.4 g/dL (ref 6.0–8.3)

## 2020-09-27 LAB — VITAMIN B12: Vitamin B-12: 318 pg/mL (ref 211–911)

## 2020-09-27 MED ORDER — QUINAPRIL HCL 40 MG PO TABS: 40.0000 mg | ORAL_TABLET | Freq: Every day | ORAL | 3 refills | Status: DC

## 2020-09-28 ENCOUNTER — Other Ambulatory Visit: Payer: Self-pay

## 2020-09-28 DIAGNOSIS — D72819 Decreased white blood cell count, unspecified: Secondary | ICD-10-CM

## 2020-09-28 DIAGNOSIS — D7589 Other specified diseases of blood and blood-forming organs: Secondary | ICD-10-CM

## 2020-09-28 LAB — PATHOLOGIST SMEAR REVIEW

## 2020-09-30 ENCOUNTER — Telehealth: Payer: Self-pay | Admitting: Nurse Practitioner

## 2020-09-30 NOTE — Telephone Encounter (Signed)
Received a new hem referral from Dr. Yong Channel for leukopenia and macrocytosis. [name] has been cld and scheduled to see Lattie Haw on 5/2 at 1:15pm. Pt aware to arrive 15 minutes early.

## 2020-10-15 ENCOUNTER — Encounter: Payer: Self-pay | Admitting: *Deleted

## 2020-10-15 ENCOUNTER — Telehealth: Payer: Self-pay | Admitting: *Deleted

## 2020-10-15 ENCOUNTER — Other Ambulatory Visit: Payer: Self-pay | Admitting: *Deleted

## 2020-10-15 DIAGNOSIS — D72829 Elevated white blood cell count, unspecified: Secondary | ICD-10-CM

## 2020-10-15 NOTE — Telephone Encounter (Signed)
Called patient and provided information on office location for appointment on 10/18/20--arrive at 12:45 for lab/OV. Informed he can bring one visitor and both should wear a mask. Requested to call and reschedule appointment for any covid symptoms or exposure. [name] is married to his wife, Barry Decker. They have five children and several grandchildren. 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 He is independent in all ALDs with no restrictions.  He is not a smoker--quit 60 years ago. Drinks 1-2 cocktails before dinner daily. Does not use smokeless tobacco or any drug usage.

## 2020-10-18 ENCOUNTER — Telehealth: Payer: Self-pay | Admitting: Nurse Practitioner

## 2020-10-18 ENCOUNTER — Other Ambulatory Visit: Payer: Self-pay

## 2020-10-18 ENCOUNTER — Inpatient Hospital Stay: Payer: PPO

## 2020-10-18 ENCOUNTER — Inpatient Hospital Stay: Payer: PPO | Attending: Nurse Practitioner | Admitting: Nurse Practitioner

## 2020-10-18 VITALS — BP 135/75 | HR 67 | Temp 98.1°F | Resp 20 | Ht 72.0 in | Wt 224.8 lb

## 2020-10-18 DIAGNOSIS — D7589 Other specified diseases of blood and blood-forming organs: Secondary | ICD-10-CM

## 2020-10-18 DIAGNOSIS — D72829 Elevated white blood cell count, unspecified: Secondary | ICD-10-CM | POA: Diagnosis not present

## 2020-10-18 LAB — CBC WITH DIFFERENTIAL (CANCER CENTER ONLY)
Abs Immature Granulocytes: 0.01 10*3/uL (ref 0.00–0.07)
Basophils Absolute: 0 10*3/uL (ref 0.0–0.1)
Basophils Relative: 1 %
Eosinophils Absolute: 0.2 10*3/uL (ref 0.0–0.5)
Eosinophils Relative: 3 %
HCT: 42.1 % (ref 39.0–52.0)
Hemoglobin: 14.3 g/dL (ref 13.0–17.0)
Immature Granulocytes: 0 %
Lymphocytes Relative: 30 %
Lymphs Abs: 1.6 10*3/uL (ref 0.7–4.0)
MCH: 36.7 pg — ABNORMAL HIGH (ref 26.0–34.0)
MCHC: 34 g/dL (ref 30.0–36.0)
MCV: 107.9 fL — ABNORMAL HIGH (ref 80.0–100.0)
Monocytes Absolute: 0.6 10*3/uL (ref 0.1–1.0)
Monocytes Relative: 11 %
Neutro Abs: 3 10*3/uL (ref 1.7–7.7)
Neutrophils Relative %: 55 %
Platelet Count: 191 10*3/uL (ref 150–400)
RBC: 3.9 MIL/uL — ABNORMAL LOW (ref 4.22–5.81)
RDW: 14.3 % (ref 11.5–15.5)
WBC Count: 5.5 10*3/uL (ref 4.0–10.5)
nRBC: 0 % (ref 0.0–0.2)

## 2020-10-18 LAB — SAVE SMEAR(SSMR), FOR PROVIDER SLIDE REVIEW

## 2020-10-18 LAB — RETIC PANEL
Immature Retic Fract: 13.3 % (ref 2.3–15.9)
RBC.: 3.89 MIL/uL — ABNORMAL LOW (ref 4.22–5.81)
Retic Count, Absolute: 61.9 10*3/uL (ref 19.0–186.0)
Retic Ct Pct: 1.6 % (ref 0.4–3.1)
Reticulocyte Hemoglobin: 38.4 pg (ref >27.9)

## 2020-10-18 NOTE — Telephone Encounter (Signed)
Appointments scheduled calendar printed per 5/2 los

## 2020-10-18 NOTE — Progress Notes (Addendum)
New Hematology/Oncology Consult   Requesting MD: Dr. Garret Reddish  [phone]  Reason for Consult: Red cell macrocytosis, mild leukopenia  HPI: [name] is an 87 year old man with a longstanding history of red cell macrocytosis (11/11/2013 hemoglobin 14.3, MCV 102.8).  He was found to have B12 deficiency October 2020 at which time the B12 level returned low at 206.  B12 injections initiated.  It was then recommended he take oral B12.  He took oral B12 for a few weeks and then discontinued, estimates he has not taken any in 6 months.  Most recent B12 level on 09/27/2020 returned in normal range at 318.  CBC also on 09/27/2020 showed hemoglobin 13.2, MCV 108.9, white count 3.4, platelets 206,000.  He was referred to the [facility] for persistent red cell macrocytosis and mild leukopenia.     Past Medical History:  Diagnosis Date  . Aortic atherosclerosis ([place])   . Aortic valve disease    a. severe aortic stenosis s/p TAVR 09/2017.  . Arthritis    "spinal" (11/16/2015)  . Atypical nevus 05/14/2013   moderate atypia - left scapula  . Colon cancer ([place])    precancer-partial colectomy  . Dilated aortic root ([place])   . Elevated HDL   . First degree AV block   . First degree AV block   . Gout   . Heart murmur   . History of kidney stones   . Hyperlipidemia   . Hypertension   . LBBB (left bundle branch block)   . Nonobstructive atherosclerosis of coronary artery   . Squamous cell skin cancer 08/04/2011   Left outer brow - CX3 + 5FU  . Thoracic aortic aneurysm [facility])   :   Past Surgical History:  Procedure Laterality Date  . BACK SURGERY    . CATARACT EXTRACTION W/ INTRAOCULAR LENS  IMPLANT, BILATERAL Bilateral 2017  . COLONOSCOPY W/ POLYPECTOMY    . FACET JOINT INJECTION  2009  . LITHOTRIPSY  2010  . LUMBAR LAMINECTOMY  ~ 1992  . PARTIAL COLECTOMY  1997   precancerours  . PARTIAL KNEE ARTHROPLASTY Left 11/16/2015   Procedure: LEFT KNEE UNICOMPARTMENTAL ;  Surgeon:  [name], MD;  Location: [facility];  Service: Orthopedics;  Laterality: Left;  . REPLACEMENT UNICONDYLAR JOINT KNEE Left 11/16/2015  . RIGHT/LEFT HEART CATH AND CORONARY ANGIOGRAPHY N/A 08/10/2017   Procedure: RIGHT/LEFT HEART CATH AND CORONARY ANGIOGRAPHY;  Surgeon: [name], MD;  Location: [facility];  Service: Cardiovascular;  Laterality: N/A;  . TEE WITHOUT CARDIOVERSION N/A 09/25/2017   Procedure: TRANSESOPHAGEAL ECHOCARDIOGRAM (TEE);  Surgeon: [name], MD;  Location: [facility];  Service: Open Heart Surgery;  Laterality: N/A;  . TONSILLECTOMY    . TRANSCATHETER AORTIC VALVE REPLACEMENT, TRANSFEMORAL N/A 09/25/2017   Procedure: TRANSCATHETER AORTIC VALVE REPLACEMENT, TRANSFEMORAL;  Surgeon: [name], MD;  Location: [facility];  Service: Open Heart Surgery;  Laterality: N/A;  . TYMPANOPLASTY Left 1998   ruptured ear drum with skin graft  :   Current Outpatient Medications:  .  allopurinol (ZYLOPRIM) 300 MG tablet, TAKE 1 TABLET BY MOUTH EVERY DAY (Patient taking differently: Take 300 mg by mouth daily.), Disp: 90 tablet, Rfl: 3 .  amLODipine (NORVASC) 10 MG tablet, TAKE 1 TABLET BY MOUTH EVERY DAY (Patient taking differently: Take 10 mg by mouth in the morning.), Disp: 90 tablet, Rfl: 3 .  amoxicillin (AMOXIL) 500 MG tablet, Take 4 tablets (2,000 mg total) by mouth as directed. 1 hour prior  to dental work including cleanings (Patient not taking: Reported on 10/15/2020), Disp: 8 tablet, Rfl: 2 .  diphenhydrAMINE (BENADRYL) 25 mg capsule, Take 25 mg by mouth at bedtime as needed for sleep. (Patient not taking: Reported on 10/15/2020), Disp: , Rfl:  .  furosemide (LASIX) 20 MG tablet, Take 1 tablet (20 mg total) by mouth daily., Disp: 90 tablet, Rfl: 3 .  hydrochlorothiazide (MICROZIDE) 12.5 MG capsule, Take 1 capsule (12.5 mg total) by mouth daily. (Patient taking differently: Take 12.5 mg by mouth daily at 12 noon.), Disp: 90 capsule, Rfl: 3 .   potassium citrate (UROCIT-K) 10 MEQ (1080 MG) SR tablet, Take 1 tablet (10 mEq total) by mouth 3 (three) times daily with meals., Disp: 300 tablet, Rfl: 1 .  quinapril (ACCUPRIL) 40 MG tablet, Take 1 tablet (40 mg total) by mouth daily., Disp: 90 tablet, Rfl: 3:  :  No Known Allergies:  FH: No family history of a blood disorder.  SOCIAL HISTORY: He lives with his wife in [place].  He has 5 children.  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  He quit smoking 40-50 years ago.  He drinks a couple of cocktails daily before dinner.  Review of Systems: In general he feels well.  No fevers or sweats.  He reports a good appetite.  No weight loss.  He states his energy level is "normal" for a man of his age.  He denies bleeding.  No unusual headaches.  No visual disturbance.  No cough or shortness of breath.  His wife reports mild dyspnea on exertion which is stable.  He denies chest pain.  No change in bowel habits.  No blood or pain with bowel movements.  His wife reports he takes Metamucil due to mild intermittent constipation.  No urinary symptoms.  No numbness or tingling in the hands or feet.  Physical Exam:  Blood pressure 135/75, pulse 67, temperature 98.1 F (36.7 C), temperature source Oral, resp. rate 20, height 6' (1.829 m), weight 224 lb 12.8 oz (102 kg), SpO2 96 %.  HEENT: No thrush or ulcers. Lungs: Lungs clear bilaterally.  No wheezes or rales. Cardiac: Regular rate and rhythm. Abdomen: Abdomen soft and nontender.  No hepatosplenomegaly.  Vascular: No leg edema. Lymph nodes: No palpable cervical, supraclavicular, axillary or inguinal lymph nodes. Neurologic: Alert and oriented.  Follows commands. Skin: No telangiectasias noted.  LABS:   Recent Labs    10/18/20 1239  WBC 5.5  HGB 14.3  HCT 42.1  PLT 191  Peripheral blood smear: (1) RBC-few ovalocytes, macrocytes, teardrops, rare target, polychromasia not increased (2) Platelets-normal  (3) WBC-morphology unremarkable  Assessment and Plan:   1. Red cell macrocytosis, chronic 2. Mild leukopenia on CBC 09/27/2020, normal on 10/18/2020 3. History of B12 deficiency 4. Hypertension 5. Hyperlipidemia 6. Severe AS status post TAVR April 2019 7. Nonobstructive CAD 8. Thoracic aortic aneurysm  [name] has a chronic red cell macrocytosis.  We discussed various possible etiologies including B12 deficiency (recent B12 level normal, on oral B12), other vitamin deficiency, alcohol use, liver disease, medication related, myelodysplasia.  Peripheral blood smear will be reviewed.  We are adding a reticulocyte count and RBC folate to the labs from today.  We scheduled lab and a follow-up visit in 8 months, sooner if needed pending results of the above.  Patient seen with Dr. Benay Spice.   Ned Card, NP 10/18/2020, 1:45 PM   This was a shared visit with Ned Card.  [name] was interviewed and examined.  I reviewed the peripheral blood smear.  He is referred for evaluation of Red cell macrocytosis.  He is not anemic.  The white count and platelets are normal.  The elevated MCV is most likely related to chronic alcohol use with the potential for underlying liver disease.  I have a low clinical suspicion for a primary hematologic diagnosis such as myelodysplasia.  He is maintained on vitamin B12 replacement.  The peripheral blood smear does not suggest hemolysis.  Allopurinol can be associated with an elevated MCV.  We will obtain an RBC folate and reticulocyte count today.  He will return for an office visit and repeat CBC in 8 months.  The elevated MCV appears to be longstanding.  I was present for greater than 50% of today's visit.  I performed medical decision making.  Julieanne Manson, MD

## 2020-11-11 ENCOUNTER — Other Ambulatory Visit: Payer: PPO

## 2020-11-11 ENCOUNTER — Other Ambulatory Visit: Payer: Self-pay

## 2020-11-11 DIAGNOSIS — I712 Thoracic aortic aneurysm, without rupture, unspecified: Secondary | ICD-10-CM

## 2020-11-11 LAB — BASIC METABOLIC PANEL
BUN/Creatinine Ratio: 21 (ref 10–24)
BUN: 21 mg/dL (ref 8–27)
CO2: 28 mmol/L (ref 20–29)
Calcium: 9.8 mg/dL (ref 8.6–10.2)
Chloride: 103 mmol/L (ref 96–106)
Creatinine, Ser: 1.02 mg/dL (ref 0.76–1.27)
Glucose: 97 mg/dL (ref 65–99)
Potassium: 4.7 mmol/L (ref 3.5–5.2)
Sodium: 139 mmol/L (ref 134–144)
eGFR: 72 mL/min/{1.73_m2} (ref >59)

## 2020-11-12 ENCOUNTER — Telehealth: Payer: Self-pay | Admitting: *Deleted

## 2020-11-12 DIAGNOSIS — I712 Thoracic aortic aneurysm, without rupture, unspecified: Secondary | ICD-10-CM

## 2020-11-12 DIAGNOSIS — Z953 Presence of xenogenic heart valve: Secondary | ICD-10-CM

## 2020-11-12 NOTE — Telephone Encounter (Signed)
-----   Message from Charlie Pitter, [place] sent at 11/12/2020  8:26 AM EDT ----- It should be OK, since it looks like in our records this occurred prior to his last MRI, but please call radiology at [phone] to verify. Thx

## 2020-11-16 ENCOUNTER — Inpatient Hospital Stay: Admission: RE | Admit: 2020-11-16 | Payer: PPO | Source: Ambulatory Visit

## 2020-11-23 ENCOUNTER — Other Ambulatory Visit: Payer: Self-pay | Admitting: *Deleted

## 2020-11-23 DIAGNOSIS — Z953 Presence of xenogenic heart valve: Secondary | ICD-10-CM

## 2020-11-23 DIAGNOSIS — I712 Thoracic aortic aneurysm, without rupture, unspecified: Secondary | ICD-10-CM

## 2020-12-01 ENCOUNTER — Other Ambulatory Visit: Payer: Self-pay

## 2020-12-01 ENCOUNTER — Ambulatory Visit (HOSPITAL_BASED_OUTPATIENT_CLINIC_OR_DEPARTMENT_OTHER): Payer: PPO

## 2020-12-01 DIAGNOSIS — Z953 Presence of xenogenic heart valve: Secondary | ICD-10-CM | POA: Diagnosis not present

## 2020-12-01 DIAGNOSIS — Z952 Presence of prosthetic heart valve: Secondary | ICD-10-CM

## 2020-12-01 DIAGNOSIS — I712 Thoracic aortic aneurysm, without rupture: Secondary | ICD-10-CM | POA: Diagnosis not present

## 2020-12-01 LAB — ECHOCARDIOGRAM COMPLETE
AR max vel: 1.57 cm2
AV Area VTI: 1.34 cm2
AV Area mean vel: 1.36 cm2
AV Mean grad: 19 mmHg
AV Peak grad: 32 mmHg
Ao pk vel: 2.83 m/s
Area-P 1/2: 4.29 cm2
S' Lateral: 3.7 cm

## 2020-12-02 ENCOUNTER — Telehealth: Payer: Self-pay | Admitting: *Deleted

## 2020-12-02 ENCOUNTER — Ambulatory Visit (HOSPITAL_COMMUNITY)
Admission: RE | Admit: 2020-12-02 | Discharge: 2020-12-02 | Disposition: A | Discharge status: Home / Self Care | Payer: PPO | Source: Ambulatory Visit | Attending: Physician Assistant | Admitting: Physician Assistant

## 2020-12-02 DIAGNOSIS — I712 Thoracic aortic aneurysm, without rupture, unspecified: Secondary | ICD-10-CM

## 2020-12-02 DIAGNOSIS — Z952 Presence of prosthetic heart valve: Secondary | ICD-10-CM | POA: Insufficient documentation

## 2020-12-02 DIAGNOSIS — I8290 Acute embolism and thrombosis of unspecified vein: Secondary | ICD-10-CM

## 2020-12-02 DIAGNOSIS — Z79899 Other long term (current) drug therapy: Secondary | ICD-10-CM

## 2020-12-02 DIAGNOSIS — Z953 Presence of xenogenic heart valve: Secondary | ICD-10-CM | POA: Diagnosis not present

## 2020-12-02 MED ORDER — GADOBUTROL 1 MMOL/ML IV SOLN
10.0000 mL | Freq: Once | INTRAVENOUS | Status: AC | PRN
  Administered 2020-12-02: 15:00:00 10 mL via INTRAVENOUS

## 2020-12-02 MED ORDER — WARFARIN SODIUM 5 MG PO TABS: ORAL_TABLET | ORAL | 3 refills | Status: DC

## 2020-12-02 NOTE — Telephone Encounter (Signed)
Reviewed with Fuller [place]. PharmD and patient should resume prior therapeutic dose of 1.5 tablets daily except for 1 tablet on Fridays.  OK to see in coumadin clinic in 2 weeks as patient will be out of town from 6/21-6/29.  Patient notified. He has enough of 5 mg tablets to resume at this time and carry him through until seen in coumadin clinic on 6/30.  Appointment made in coumadin clinic for 6/30 at 10:30

## 2020-12-02 NOTE — Telephone Encounter (Signed)
-----   Message from Eileen Stanford, PA-C sent at 12/02/2020  8:42 AM EDT ----- EF 55%, normally functioning TAVR with a mean gradient of 19 mmHg and no PVL. However, gradients are back up again to 73mm hg. Previous cardiac CT showed severe HALT/HAM. Favor restarting Coumadin as his gradients went down to 10 mm hg on anticoagulation. Can someone please call him and start him on Coumadin 5mg  daily and get him a coumadin clinic appt in the next 1-1.5 weeks.   Additionally, there is progression of aortic dilatation to 44cm- actually it appears that the pt is scheduled for a MRA today by Melina Copa PA-C

## 2020-12-07 ENCOUNTER — Telehealth: Payer: Self-pay

## 2020-12-07 ENCOUNTER — Telehealth: Payer: PPO

## 2020-12-07 NOTE — Telephone Encounter (Signed)
Called and lm for pt tcb. 

## 2020-12-07 NOTE — Telephone Encounter (Signed)
Patient is calling in stating that he is returning a call from Clement Sayres advised that he is aware of imaging results and wanted to know if Dr.Hunter is aware of the fracture.

## 2020-12-07 NOTE — Telephone Encounter (Signed)
Reached out to pt and pt states he is in no pain or discomfort at this time and he will call back if he needs [place].

## 2020-12-16 ENCOUNTER — Other Ambulatory Visit: Payer: Self-pay

## 2020-12-16 ENCOUNTER — Ambulatory Visit (INDEPENDENT_AMBULATORY_CARE_PROVIDER_SITE_OTHER): Payer: PPO | Admitting: *Deleted

## 2020-12-16 DIAGNOSIS — I8290 Acute embolism and thrombosis of unspecified vein: Secondary | ICD-10-CM

## 2020-12-16 DIAGNOSIS — Z79899 Other long term (current) drug therapy: Secondary | ICD-10-CM | POA: Diagnosis not present

## 2020-12-16 DIAGNOSIS — Z5181 Encounter for therapeutic drug level monitoring: Secondary | ICD-10-CM | POA: Diagnosis not present

## 2020-12-16 LAB — POCT INR: INR: 2.2 (ref 2.0–3.0)

## 2020-12-16 NOTE — Patient Instructions (Addendum)
A full discussion of the nature of anticoagulants has been carried out.  A benefit risk analysis has been presented to the patient, so that they understand the justification for choosing anticoagulation at this time. The need for frequent and regular monitoring, precise dosage adjustment and compliance is stressed.  Side effects of potential bleeding are discussed.  The patient should avoid any OTC items containing aspirin or ibuprofen, and should avoid great swings in general diet.  Avoid alcohol consumption.  Call if any signs of abnormal bleeding.   Description   Continue on same dosage 1.5 tablets daily except 1 tablet on Fridays.  Recheck in 2 weeks.  Coumadin Clinic [phone], Main number [phone].

## 2020-12-21 ENCOUNTER — Telehealth: Payer: PPO

## 2020-12-27 DIAGNOSIS — L821 Other seborrheic keratosis: Secondary | ICD-10-CM | POA: Diagnosis not present

## 2020-12-27 DIAGNOSIS — L570 Actinic keratosis: Secondary | ICD-10-CM | POA: Diagnosis not present

## 2020-12-27 DIAGNOSIS — D0461 Carcinoma in situ of skin of right upper limb, including shoulder: Secondary | ICD-10-CM | POA: Diagnosis not present

## 2020-12-27 DIAGNOSIS — L738 Other specified follicular disorders: Secondary | ICD-10-CM | POA: Diagnosis not present

## 2020-12-27 DIAGNOSIS — L720 Epidermal cyst: Secondary | ICD-10-CM | POA: Diagnosis not present

## 2020-12-29 ENCOUNTER — Encounter: Payer: Self-pay | Admitting: Family Medicine

## 2020-12-30 ENCOUNTER — Ambulatory Visit (INDEPENDENT_AMBULATORY_CARE_PROVIDER_SITE_OTHER): Payer: PPO | Admitting: *Deleted

## 2020-12-30 ENCOUNTER — Other Ambulatory Visit: Payer: Self-pay

## 2020-12-30 DIAGNOSIS — I8290 Acute embolism and thrombosis of unspecified vein: Secondary | ICD-10-CM

## 2020-12-30 DIAGNOSIS — Z5181 Encounter for therapeutic drug level monitoring: Secondary | ICD-10-CM

## 2020-12-30 LAB — POCT INR: INR: 2.2 (ref 2.0–3.0)

## 2020-12-30 NOTE — Patient Instructions (Addendum)
Description   Continue taking Warfarin 1.5 tablets daily except 1 tablet on Fridays.  Recheck in 3 weeks. Coumadin Clinic [phone], Main number [phone].

## 2021-01-17 ENCOUNTER — Ambulatory Visit (INDEPENDENT_AMBULATORY_CARE_PROVIDER_SITE_OTHER): Payer: PPO | Admitting: Pharmacist

## 2021-01-17 DIAGNOSIS — E7800 Pure hypercholesterolemia, unspecified: Secondary | ICD-10-CM

## 2021-01-17 DIAGNOSIS — Z953 Presence of xenogenic heart valve: Secondary | ICD-10-CM

## 2021-01-17 DIAGNOSIS — I10 Essential (primary) hypertension: Secondary | ICD-10-CM

## 2021-01-17 NOTE — Progress Notes (Signed)
Chronic Care Management Pharmacy Note  01/17/2021 Name:  Barry Decker MRN:  [id number] DOB:  Jul 22, 1932  Summary: PharmD follow up.  Meds reviewed/updated  Recommendations/Changes made from today's visit: None at this time - recheck lipids and consider statin  Plan: FU 6 months pharmD   Subjective: Barry Decker is an 87 y.o. year old male who is a primary patient of Hunter, Brayton Mars, MD.  The CCM team was consulted for assistance with disease management and care coordination needs.    Engaged with patient by telephone for follow up visit in response to provider referral for pharmacy case management and/or care coordination services.   Consent to Services:  The patient was given the following information about Chronic Care Management services today, agreed to services, and gave verbal consent: 1. CCM service includes personalized support from designated clinical staff supervised by the primary care provider, including individualized plan of care and coordination with other care providers 2. 24/7 contact phone numbers for assistance for urgent and routine care needs. 3. Service will only be billed when office clinical staff spend 20 minutes or more in a month to coordinate care. 4. Only one practitioner may furnish and bill the service in a calendar month. 5.The patient may stop CCM services at any time (effective at the end of the month) by phone call to the office staff. 6. The patient will be responsible for cost sharing (co-pay) of up to 20% of the service fee (after annual deductible is met). Patient agreed to services and consent obtained.  Patient Care Team: [name], MD as PCP - General (Family Medicine) [name], MD as PCP - Cardiology (Cardiology) Madelin Rear, [facility] as Pharmacist (Pharmacist)   Objective:  Lab Results  Component Value Date   CREATININE 1.02 11/11/2020   BUN 21 11/11/2020   GFR 64.64 09/27/2020   GFRNONAA 61 05/11/2020   GFRAA  70 05/11/2020   NA 139 11/11/2020   K 4.7 11/11/2020   CALCIUM 9.8 11/11/2020   CO2 28 11/11/2020   GLUCOSE 97 11/11/2020    Lab Results  Component Value Date/Time   HGBA1C 5.2 09/21/2017 09:17 AM   GFR 64.64 09/27/2020 11:34 AM   GFR 67.93 09/23/2019 11:26 AM    Last diabetic Eye exam: No results found for: HMDIABEYEEXA  Last diabetic Foot exam: No results found for: HMDIABFOOTEX   Lab Results  Component Value Date   CHOL 202 (H) 03/26/2020   HDL 71 03/26/2020   LDLCALC 108 (H) 03/26/2020   LDLDIRECT 136.2 10/25/2012   TRIG 118 03/26/2020   CHOLHDL 2.8 03/26/2020    Hepatic Function Latest Ref Rng & Units 09/27/2020 03/26/2020 09/23/2019  Total Protein 6.0 - 8.3 g/dL 6.4 6.8 6.7  Albumin 3.5 - 5.2 g/dL 3.7 - 4.0  AST 0 - 37 U/L _0 ALT 0 - 53 U/L _1 Alk Phosphatase 39 - 117 U/L 100 - 78  Total Bilirubin 0.2 - 1.2 mg/dL 0.5 0.7 0.7  Bilirubin, Direct 0.0 - 0.3 mg/dL - - -    Lab Results  Component Value Date/Time   TSH 0.82 11/11/2013 10:14 AM   TSH 0.76 10/25/2012 10:09 AM    CBC Latest Ref Rng & Units 10/18/2020 09/27/2020 03/26/2020  WBC 4.0 - 10.5 K/uL 5.5 3.4(L) 4.7  Hemoglobin 13.0 - 17.0 g/dL 14.3 13.2 14.2  Hematocrit 39.0 - 52.0 % 42.1 39.7 41.5  Platelets 150 - 400 K/uL 191 206.0 216  No results found for: VD25OH  Clinical ASCVD: No  The ASCVD Risk score [name], et al., 2013) failed to calculate for the following reasons:   The 2013 ASCVD risk score is only valid for ages 16 to 6    Depression screen PHQ 2/9 08/27/2020 03/26/2020 03/21/2019  Decreased Interest 0 0 0  Down, Depressed, Hopeless 0 0 0  PHQ - 2 Score 0 0 0     Social History   Tobacco Use  Smoking Status Former   Packs/day: 1.00   Years: 20.00   Pack years: 20.00   Types: Cigarettes   Quit date: 09/15/1980   Years since quitting: 40.3  Smokeless Tobacco Never   BP Readings from Last 3 Encounters:  10/18/20 135/75  09/27/20 122/60  08/27/20 122/68   Pulse  Readings from Last 3 Encounters:  10/18/20 67  09/27/20 62  08/27/20 76   Wt Readings from Last 3 Encounters:  10/18/20 224 lb 12.8 oz (102 kg)  09/27/20 228 lb (103.4 kg)  08/27/20 229 lb 9.6 oz (104.1 kg)   BMI Readings from Last 3 Encounters:  10/18/20 30.49 kg/m  09/27/20 30.92 kg/m  08/27/20 31.14 kg/m    Assessment/Interventions: Review of patient past medical history, allergies, medications, health status, including review of consultants reports, laboratory and other test data, was performed as part of comprehensive evaluation and provision of chronic care management services.   SDOH:  (Social Determinants of Health) assessments and interventions performed: Yes  Financial Resource Strain: Not on file    SDOH Screenings   Alcohol Screen: Not on file  Depression (PHQ2-9): Low Risk    PHQ-2 Score: 0  Financial Resource Strain: Not on file  Food Insecurity: No Food Insecurity   Worried About Charity fundraiser in the Last Year: Never true   Ran Out of Food in the Last Year: Never true  Housing: Not on file  Physical Activity: Not on file  Social Connections: Not on file  Stress: Not on file  Tobacco Use: Medium Risk   Smoking Tobacco Use: Former   Smokeless Tobacco Use: Never  Transportation Needs: No Data processing manager (Medical): No   Lack of Transportation (Non-Medical): No    CCM Care Plan  No Known Allergies  Medications Reviewed Today     Reviewed by Edythe Clarity, [facility] (Pharmacist) on 01/17/21 at 1320  Med List Status: <None>   Medication Order Taking? Sig Documenting Provider Last Dose Status Informant  allopurinol (ZYLOPRIM) 300 MG tablet [id number] Yes TAKE 1 TABLET BY MOUTH EVERY DAY  Patient taking differently: Take 300 mg by mouth daily.   [name], MD Taking Active   amLODipine (NORVASC) 10 MG tablet [id number] Yes TAKE 1 TABLET BY MOUTH EVERY DAY  Patient taking differently: Take 10 mg by mouth in the  morning.   [name], MD Taking Active   amoxicillin (AMOXIL) 500 MG tablet [id number] Yes Take 4 tablets (2,000 mg total) by mouth as directed. 1 hour prior to dental work including cleanings Dunn, Nedra Hai, PA-C Taking Active   diphenhydrAMINE (BENADRYL) 25 mg capsule [id number] Yes Take 25 mg by mouth at bedtime as needed for sleep. [provider] Taking Active   furosemide (LASIX) 20 MG tablet [id number] Yes Take 1 tablet (20 mg total) by mouth daily. [name], MD Taking Active Self  hydrochlorothiazide (MICROZIDE) 12.5 MG capsule [id number]  Take 1 capsule (12.5 mg total) by mouth daily.  Patient taking differently: Take 12.5 mg by mouth daily at 12 noon.   Charlie Pitter, PA-C  Expired 08/01/20 2359 Self  potassium citrate (UROCIT-K) 10 MEQ (1080 MG) SR tablet [id number] Yes Take 1 tablet (10 mEq total) by mouth 3 (three) times daily with meals. Swords, Darrick Penna, MD Taking Active Self  quinapril (ACCUPRIL) 40 MG tablet [id number] Yes Take 1 tablet (40 mg total) by mouth daily. [name], MD Taking Active   warfarin (COUMADIN) 5 MG tablet [id number] Yes 1.5 tablets daily except for 1 tablet on Fridays Eileen Stanford, [place] Taking Active             Patient Active Problem List   Diagnosis Date Noted   Medication management 12/02/2020   Thrombus 05/31/2020   Encounter for therapeutic drug monitoring 05/31/2020   Ascending aortic aneurysm ([place]) 03/26/2020   Aortic atherosclerosis ([place]) 03/26/2020   B12 deficiency 09/23/2019   Macrocytic anemia 03/21/2019   Status post transcatheter aortic valve replacement (TAVR) using bioprosthesis 09/25/2017   Kidney stones    Chronic diastolic CHF (congestive heart failure) ([place]) 03/30/2017   Abdominal aortic ectasia ([place]) 04/07/2016   Osteoarthritis of cervical spine 08/31/2015   Throat pain in adult 08/31/2015   Osteoarthritis of left knee 05/25/2014   HYPERCHOLESTEROLEMIA WITH HIGH HDL 01/31/2007   Gout  01/31/2007   Essential hypertension 01/31/2007   History of malignant neoplasm of large intestine 01/31/2007    Immunization History  Administered Date(s) Administered   Fluad Quad(high Dose 65+) 03/05/2020   Influenza, High Dose Seasonal PF 03/26/2014, 04/08/2017, 03/20/2018, 02/03/2019, 03/04/2019   Influenza,inj,Quad PF,6+ Mos 03/14/2013   Influenza-Unspecified 04/06/2015, 03/02/2016, 02/28/2017, 03/20/2018   PFIZER Comirnaty(Gray Top)Covid-19 Tri-Sucrose Vaccine 11/18/2020   PFIZER(Purple Top)SARS-COV-2 Vaccination 07/09/2019, 07/28/2019, 03/17/2020   Pneumococcal Conjugate-13 05/25/2014   Pneumococcal Polysaccharide-23 06/19/2004   Td 12/27/2007   Tdap 05/22/2018   Zoster Recombinat (Shingrix) 06/19/2018, 12/26/2018   Zoster, Live 06/19/2008    Conditions to be addressed/monitored:  HTN, HLD, Aortic Valve Replacement, HF  Care Plan : General Pharmacy (Adult)  Updates made by Edythe Clarity, RPH since 01/17/2021 12:00 AM     Problem: HTN, HLD, Aortic Valve Replacement, HF   Priority: High  Onset Date: 01/17/2021     Long-Range Goal: Patient-Specific Goal   Start Date: 01/17/2021  Expected End Date: 07/20/2021  This Visit's Progress: On track  Priority: High  Note:   Current Barriers:  Suboptimal therapeutic regimen for HLD  Pharmacist Clinical Goal(s):  Patient will achieve control of cholesterol as evidenced by lipid panel contact provider office for questions/concerns as evidenced notation of same in electronic health record through collaboration with PharmD and provider.   Interventions: 1:1 collaboration with [name], MD regarding development and update of comprehensive plan of care as evidenced by provider attestation and co-signature Inter-disciplinary care team collaboration (see longitudinal plan of care) Comprehensive medication review performed; medication list updated in electronic medical record  Hypertension  (Status:Goal on track: YES.)    Med Management Intervention: None  (BP goal <130/80) -Controlled -Current treatment: Amlodipine 49m daily HCTZ 12.559mdaily Quinapril 4030maily -Medications previously tried: none noted  -Current home readings: 122/67, 124/68, 128/68, 129/73, 124/67, 126/75  -Current exercise habits: "not as much as he would like" -Denies hypotensive/hypertensive symptoms -Educated on BP goals and benefits of medications for prevention of heart attack, stroke and kidney damage; Exercise goal of 150 minutes per week; Importance of home blood pressure monitoring; Symptoms of hypotension and importance  of maintaining adequate hydration; -Counseled to monitor BP at home daily, document, and provide log at future appointments -Recommended to continue current medication  Hyperlipidemia: (LDL goal < 70) -Uncontrolled -Current treatment: NONE -Medications previously tried: none noted  -Has previously been offered a statin, but declined and opted to work on lifestyle changes -Educated on Cholesterol goals;  Benefits of statin for ASCVD risk reduction; Importance of limiting foods high in cholesterol; Strategies to manage statin-induced myalgias; -Recommended to continue current management - recheck lipids, reconsider statin if still elevated  Aortic Valve Replacement (Goal: -) -Controlled -Current treatment  Warfarin 84m - as directed by clinic -Medications previously tried: none noted -At goal most recently  -He denies any abnormal bleeding, bruising, etc  -Recommended to continue current medication  Patient Goals/Self-Care Activities Patient will:  - take medications as prescribed check blood pressure daily, document, and provide at future appointments target a minimum of 150 minutes of moderate intensity exercise weekly  Follow Up Plan: The care management team will reach out to the patient again over the next 180 days.         Medication Assistance: None required.  Patient affirms  current coverage meets needs.  Compliance/Adherence/Medication fill history: Care Gaps: None at this time  Star-Rating Drugs:  Patient's preferred pharmacy is:  [facility] [place] [address] [place] [address]Phone: [phone]: [phone] [id number] [id number]: [phone]: [phone] Pt endorses 100% compliance  We discussed: Benefits of medication synchronization, packaging and delivery as well as enhanced pharmacist oversight with [facility]. Patient decided to: Continue current medication management strategy  Care Plan and Follow Up Patient Decision:  Patient agrees to Care Plan and Follow-up.  Plan: The care management team will reach out to the patient again over the next 180 days.  ChBeverly MilchPharmD Clinical Pharmacist  [phone]

## 2021-01-17 NOTE — Patient Instructions (Addendum)
Visit Information   Goals Addressed             This Visit's Progress    Track and Manage My Blood Pressure-Hypertension       Timeframe:  Long-Range Goal Priority:  High Start Date: 01/17/21                            Expected End Date:  07/20/21                   Follow Up Date 05/18/21    - check blood pressure daily - choose a place to take my blood pressure (home, clinic or office, retail store) - write blood pressure results in a log or diary    Why is this important?   You won't feel high blood pressure, but it can still hurt your blood vessels.  High blood pressure can cause heart or kidney problems. It can also cause a stroke.  Making lifestyle changes like losing a little weight or eating less salt will help.  Checking your blood pressure at home and at different times of the day can help to control blood pressure.  If the doctor prescribes medicine remember to take it the way the doctor ordered.  Call the office if you cannot afford the medicine or if there are questions about it.     Notes:        Patient Care Plan: General Pharmacy (Adult)     Problem Identified: HTN, HLD, Aortic Valve Replacement, HF   Priority: High  Onset Date: 01/17/2021     Long-Range Goal: Patient-Specific Goal   Start Date: 01/17/2021  Expected End Date: 07/20/2021  This Visit's Progress: On track  Priority: High  Note:   Current Barriers:  Suboptimal therapeutic regimen for HLD  Pharmacist Clinical Goal(s):  Patient will achieve control of cholesterol as evidenced by lipid panel contact provider office for questions/concerns as evidenced notation of same in electronic health record through collaboration with PharmD and provider.   Interventions: 1:1 collaboration with [name], MD regarding development and update of comprehensive plan of care as evidenced by provider attestation and co-signature Inter-disciplinary care team collaboration (see longitudinal plan of  care) Comprehensive medication review performed; medication list updated in electronic medical record  Hypertension  (Status:Goal on track: YES.)   Med Management Intervention: None  (BP goal <130/80) -Controlled -Current treatment: Amlodipine '10mg'$  daily HCTZ 12.'5mg'$  daily Quinapril '40mg'$  daily -Medications previously tried: none noted  -Current home readings: 122/67, 124/68, 128/68, 129/73, 124/67, 126/75  -Current exercise habits: "not as much as he would like" -Denies hypotensive/hypertensive symptoms -Educated on BP goals and benefits of medications for prevention of heart attack, stroke and kidney damage; Exercise goal of 150 minutes per week; Importance of home blood pressure monitoring; Symptoms of hypotension and importance of maintaining adequate hydration; -Counseled to monitor BP at home daily, document, and provide log at future appointments -Recommended to continue current medication  Hyperlipidemia: (LDL goal < 70) -Uncontrolled -Current treatment: NONE -Medications previously tried: none noted  -Has previously been offered a statin, but declined and opted to work on lifestyle changes -Educated on Cholesterol goals;  Benefits of statin for ASCVD risk reduction; Importance of limiting foods high in cholesterol; Strategies to manage statin-induced myalgias; -Recommended to continue current management - recheck lipids, reconsider statin if still elevated  Aortic Valve Replacement (Goal: -) -Controlled -Current treatment  Warfarin '5mg'$  - as directed by clinic -  Medications previously tried: none noted -At goal most recently  -He denies any abnormal bleeding, bruising, etc  -Recommended to continue current medication  Patient Goals/Self-Care Activities Patient will:  - take medications as prescribed check blood pressure daily, document, and provide at future appointments target a minimum of 150 minutes of moderate intensity exercise weekly  Follow Up Plan: The  care management team will reach out to the patient again over the next 180 days.         Patient verbalizes understanding of instructions provided today and agrees to view in [place].  Telephone follow up appointment with pharmacy team member scheduled for: 6 months  Edythe Clarity, [place]

## 2021-01-21 ENCOUNTER — Ambulatory Visit (INDEPENDENT_AMBULATORY_CARE_PROVIDER_SITE_OTHER): Payer: PPO

## 2021-01-21 ENCOUNTER — Other Ambulatory Visit: Payer: Self-pay

## 2021-01-21 DIAGNOSIS — Z5181 Encounter for therapeutic drug level monitoring: Secondary | ICD-10-CM | POA: Diagnosis not present

## 2021-01-21 DIAGNOSIS — I8290 Acute embolism and thrombosis of unspecified vein: Secondary | ICD-10-CM

## 2021-01-21 DIAGNOSIS — Z79899 Other long term (current) drug therapy: Secondary | ICD-10-CM | POA: Diagnosis not present

## 2021-01-21 LAB — POCT INR: INR: 1.5 — AB (ref 2.0–3.0)

## 2021-01-21 MED ORDER — WARFARIN SODIUM 5 MG PO TABS: ORAL_TABLET | ORAL | 3 refills | Status: DC

## 2021-01-21 NOTE — Patient Instructions (Signed)
Description   Take 2 tablets today, then resume same dosage of Warfarin 1.5 tablets daily except 1 tablet on Fridays.  Recheck in 2 weeks. [facility] [phone], Main number [phone].

## 2021-02-15 ENCOUNTER — Telehealth (INDEPENDENT_AMBULATORY_CARE_PROVIDER_SITE_OTHER): Payer: PPO | Admitting: Family Medicine

## 2021-02-15 ENCOUNTER — Encounter: Payer: Self-pay | Admitting: Family Medicine

## 2021-02-15 DIAGNOSIS — U071 COVID-19: Secondary | ICD-10-CM

## 2021-02-15 MED ORDER — MOLNUPIRAVIR EUA 200MG CAPSULE: 4.0000 | ORAL_CAPSULE | Freq: Two times a day (BID) | ORAL | 0 refills | Status: AC

## 2021-02-15 NOTE — Progress Notes (Signed)
Virtual Visit via Video Note  I connected with [place]  on 02/15/21 at  5:00 PM EDT by a video enabled telemedicine application and verified that I am speaking with the correct person using two identifiers.  Location patient: home, [place] Location provider:work or home office Persons participating in the virtual visit: patient, provider, wife is there too  I discussed the limitations of evaluation and management by telemedicine and the availability of in person appointments. The patient expressed understanding and agreed to proceed.   HPI:  Acute telemedicine visit for Covid19: -Onset: 2 days ago; tested positive for COVID, wife is sick with COVID as well -Symptoms include:sore throat, nasal congestion, slight cough, occ ha -Denies: fever, NVD, CP, SOB, inability to eat/drink/get out of bed -Pertinent past medical history: see below -Pertinent medication allergies: No Known Allergies -COVID-19 vaccine status: 2 doses and 2 boosters -GFR 72 in May  ROS: See pertinent positives and negatives per HPI.  Past Medical History:  Diagnosis Date   Aortic atherosclerosis ([place])    Aortic valve disease    a. severe aortic stenosis s/p TAVR 09/2017.   Arthritis    "spinal" (11/16/2015)   Atypical nevus 05/14/2013   moderate atypia - left scapula   Colon cancer ([facility])    precancer-partial colectomy   Dilated aortic root ([facility])    Elevated HDL    First degree AV block    First degree AV block    Gout    Heart murmur    History of kidney stones    Hyperlipidemia    Hypertension    LBBB (left bundle branch block)    Nonobstructive atherosclerosis of coronary artery    Squamous cell skin cancer 08/04/2011   Left outer brow - CX3 + 5FU   Thoracic aortic aneurysm [facility])     Past Surgical History:  Procedure Laterality Date   BACK SURGERY     CATARACT EXTRACTION W/ INTRAOCULAR LENS  IMPLANT, BILATERAL Bilateral 2017   COLONOSCOPY W/ POLYPECTOMY     FACET JOINT INJECTION  2009   LITHOTRIPSY   2010   LUMBAR LAMINECTOMY  ~ [place]   precancerours   PARTIAL KNEE ARTHROPLASTY Left 11/16/2015   Procedure: LEFT KNEE UNICOMPARTMENTAL ;  Surgeon: [name], MD;  Location: [facility];  Service: Orthopedics;  Laterality: Left;   REPLACEMENT UNICONDYLAR JOINT KNEE Left 11/16/2015   RIGHT/LEFT HEART CATH AND CORONARY ANGIOGRAPHY N/A 08/10/2017   Procedure: RIGHT/LEFT HEART CATH AND CORONARY ANGIOGRAPHY;  Surgeon: [name], MD;  Location: [place] [facility];  Service: Cardiovascular;  Laterality: N/A;   TEE WITHOUT CARDIOVERSION N/A 09/25/2017   Procedure: TRANSESOPHAGEAL ECHOCARDIOGRAM (TEE);  Surgeon: [name], MD;  Location: [facility];  Service: Open Heart Surgery;  Laterality: N/A;   TONSILLECTOMY     TRANSCATHETER AORTIC VALVE REPLACEMENT, TRANSFEMORAL N/A 09/25/2017   Procedure: TRANSCATHETER AORTIC VALVE REPLACEMENT, TRANSFEMORAL;  Surgeon: [name], MD;  Location: [facility];  Service: Open Heart Surgery;  Laterality: N/A;   TYMPANOPLASTY Left 1998   ruptured ear drum with skin graft     Current Outpatient Medications:    allopurinol (ZYLOPRIM) 300 MG tablet, TAKE 1 TABLET BY MOUTH EVERY DAY (Patient taking differently: Take 300 mg by mouth daily.), Disp: 90 tablet, Rfl: 3   amLODipine (NORVASC) 10 MG tablet, TAKE 1 TABLET BY MOUTH EVERY DAY (Patient taking differently: Take 10 mg by mouth in the morning.), Disp: 90 tablet, Rfl: 3  amoxicillin (AMOXIL) 500 MG tablet, Take 4 tablets (2,000 mg total) by mouth as directed. 1 hour prior to dental work including cleanings, Disp: 8 tablet, Rfl: 2   diphenhydrAMINE (BENADRYL) 25 mg capsule, Take 25 mg by mouth at bedtime as needed for sleep., Disp: , Rfl:    furosemide (LASIX) 20 MG tablet, Take 1 tablet (20 mg total) by mouth daily., Disp: 90 tablet, Rfl: 3   molnupiravir EUA 200 mg CAPS, Take 4 capsules (800 mg total) by mouth 2 (two) times daily for 5 days., Disp: 40 capsule, Rfl: 0    potassium citrate (UROCIT-K) 10 MEQ (1080 MG) SR tablet, Take 1 tablet (10 mEq total) by mouth 3 (three) times daily with meals., Disp: 300 tablet, Rfl: 1   quinapril (ACCUPRIL) 40 MG tablet, Take 1 tablet (40 mg total) by mouth daily., Disp: 90 tablet, Rfl: 3   warfarin (COUMADIN) 5 MG tablet, Take 1 to 1.5 tablets once daily as directed by anticoagulation clinic, Disp: 45 tablet, Rfl: 3   hydrochlorothiazide (MICROZIDE) 12.5 MG capsule, Take 1 capsule (12.5 mg total) by mouth daily. (Patient taking differently: Take 12.5 mg by mouth daily at 12 noon.), Disp: 90 capsule, Rfl: 3  EXAM:  VITALS per patient if applicable: T 0000000, wt 223, 112/69  GENERAL: alert, oriented, appears well and in no acute distress  HEENT: atraumatic, conjunttiva clear, no obvious abnormalities on inspection of external nose and ears  NECK: normal movements of the head and neck  LUNGS: on inspection no signs of respiratory distress, breathing rate appears normal, no obvious gross SOB, gasping or wheezing  CV: no obvious cyanosis  MS: moves all visible extremities without noticeable abnormality  PSYCH/NEURO: pleasant and cooperative, no obvious depression or anxiety, speech and thought processing grossly intact  ASSESSMENT AND PLAN:  Discussed the following assessment and plan:  COVID-19   Discussed treatment options (infusions and oral options and risk of drug interactions), ideal treatment window, potential complications, isolation and precautions for COVID-19.  Discussed possibility of rebound with antivirals and the need to reisolate if it should occur for 5 days. Checked for/reviewed any labs done in the last 90 days with GFR listed in HPI if available. After lengthy discussion, the patient opted for treatment with molnupiravir due to being higher risk for complications of covid or severe disease and other factors.  He opted against Paxlovid given the potential interactions with his warfarin.  Discussed EUA  status of this drug and the fact that there is preliminary limited knowledge of risks/interactions/side effects per EUA document vs possible benefits and precautions. This information was shared with patient during the visit and also was provided in patient instructions. Also, advised that patient discuss risks/interactions and use with pharmacist/treatment team as well.  Other symptomatic care measures summarized in patient instructions. Advised to seek prompt in person care if worsening, new symptoms arise, or if is not improving with treatment. Discussed options for inperson care if PCP office not available. Did let this patient know that I only do telemedicine on Tuesdays and Thursdays for [place]. Advised to schedule follow up visit with PCP or [facility] if any further questions or concerns to avoid delays in care.   I discussed the assessment and treatment plan with the patient. The patient was provided an opportunity to ask questions and all were answered. The patient agreed with the plan and demonstrated an understanding of the instructions.     Lucretia Kern, DO

## 2021-02-15 NOTE — Patient Instructions (Signed)
HOME CARE TIPS:   -I sent the medication(s) we discussed to your pharmacy: Meds ordered this encounter  Medications   molnupiravir EUA 200 mg CAPS    Sig: Take 4 capsules (800 mg total) by mouth 2 (two) times daily for 5 days.    Dispense:  40 capsule    Refill:  0     -I sent in the [place] treatment or referral you requested per our discussion. Please see the information provided below and discuss further with the pharmacist/treatment team.   -If taking an antiviral, there is a chance of rebound illness after finishing your treatment. If you become sick again please isolate for an additional 5 days.    -can use tylenol  if needed for fevers, aches and pains per instructions  -can use nasal saline a few times per day if you have nasal congestion  -stay hydrated, drink plenty of fluids and eat small healthy meals - avoid dairy  -follow up with your doctor in 2-3 days unless improving and feeling better  -stay home while sick, except to seek medical care. If you have COVID19, ideally it would be best to stay home for a full 10 days since the onset of symptoms PLUS one day of no fever and feeling better. Wear a good mask that fits snugly (such as N95 or KN95) if around others to reduce the risk of transmission.  It was nice to meet you today, and I really hope you are feeling better soon. I help [facility] out with telemedicine visits on Tuesdays and Thursdays and am available for visits on those days. If you have any concerns or questions following this visit please schedule a follow up visit with your Primary Care doctor or seek care at a local urgent care clinic to avoid delays in care.    Seek in person care or schedule a follow up video visit promptly if your symptoms worsen, new concerns arise or you are not improving with treatment. Call 911 and/or seek emergency care if your symptoms are severe or life threatening.    Fact Sheet for Patients And Caregivers Emergency Use  Authorization (EUA) Of LAGEVRIOT (molnupiravir) capsules For Coronavirus Disease 2019 (COVID-19)  What is the most important information I should know about LAGEVRIO? LAGEVRIO may cause serious side effects, including: ? LAGEVRIO may cause harm to your unborn baby. It is not known if LAGEVRIO will harm your baby if you take LAGEVRIO during pregnancy. o LAGEVRIO is not recommended for use in pregnancy. o LAGEVRIO has not been studied in pregnancy. LAGEVRIO was studied in pregnant animals only. When LAGEVRIO was given to pregnant animals, LAGEVRIO caused harm to their unborn babies. o You and your healthcare provider may decide that you should take LAGEVRIO during pregnancy if there are no other COVID-19 treatment options approved or authorized by the FDA that are accessible or clinically appropriate for you. o If you and your healthcare provider decide that you should take LAGEVRIO during pregnancy, you and your healthcare provider should discuss the known and potential benefits and the potential risks of taking LAGEVRIO during pregnancy. For individuals who are able to become pregnant: ? You should use a reliable method of birth control (contraception) consistently and correctly during treatment with LAGEVRIO and for 4 days after the last dose of LAGEVRIO. Talk to your healthcare provider about reliable birth control methods. ? Before starting treatment with [facility] your healthcare provider may do a pregnancy test to see if you are pregnant before starting  treatment with LAGEVRIO. ? Tell your healthcare provider right away if you become pregnant or think you may be pregnant during treatment with LAGEVRIO. Pregnancy Surveillance Program: ? There is a pregnancy surveillance program for individuals who take LAGEVRIO during pregnancy. The purpose of this program is to collect information about the health of you and your baby. Talk to your healthcare provider about how to take part in this  program. ? If you take LAGEVRIO during pregnancy and you agree to participate in the pregnancy surveillance program and allow your healthcare provider to share your information with [place], then your healthcare provider will report your use of [place] during pregnancy to [place]. by calling [phone] or [email or web address].es. For individuals who are sexually active with partners who are able to become pregnant: ? It is not known if LAGEVRIO can affect sperm. While the risk is regarded as low, animal studies to fully assess the potential for LAGEVRIO to affect the babies of males treated with LAGEVRIO have not been completed. A reliable method of birth control (contraception) should be used consistently and correctly during treatment with LAGEVRIO and for at least 3 months after the last dose. The risk to sperm beyond 3 months is not known. Studies to understand the risk to sperm beyond 3 months are ongoing. Talk to your healthcare provider about reliable birth control methods. Talk to your healthcare provider if you have questions or concerns about how LAGEVRIO may affect sperm. You are being given this fact sheet because your healthcare provider believes it is necessary to provide you with LAGEVRIO for the treatment of adults with mild-to-moderate coronavirus disease 2019 (COVID-19) with positive results of direct SARS-CoV-2 viral testing, and who are at high risk for progression to severe COVID-19 including hospitalization or death, and for whom other COVID-19 treatment options approved or authorized by the FDA are not accessible or clinically appropriate. The U.S. Food and Drug Administration (FDA) has issued an Emergency Use Authorization (EUA) to make LAGEVRIO available during the COVID-19 pandemic (for more details about an EUA please see "What is an Emergency Use Authorization?" at the end of this document). LAGEVRIO is not an FDA-approved  medicine in the [place]. Read this Fact Sheet for information about LAGEVRIO. Talk to your healthcare provider about your options if you have any questions. It is your choice to take LAGEVRIO.  What is COVID-19? COVID-19 is caused by a virus called a coronavirus. You can get COVID-19 through close contact with another person who has the virus. COVID-19 illnesses have ranged from very mild-to-severe, including illness resulting in death. While information so far suggests that most COVID-19 illness is mild, serious illness can happen and may cause some of your other medical conditions to become worse. Older people and people of all ages with severe, long lasting (chronic) medical conditions like heart disease, lung disease and diabetes, for example seem to be at higher risk of being hospitalized for COVID-19.  What is LAGEVRIO? LAGEVRIO is an investigational medicine used to treat mild-to-moderate COVID-19 in adults: ? with positive results of direct SARS-CoV-2 viral testing, and ? who are at high risk for progression to severe COVID-19 including hospitalization or death, and for whom other COVID-19 treatment options approved or authorized by the FDA are not accessible or clinically appropriate. The FDA has authorized the emergency use of LAGEVRIO for the treatment of mild-tomoderate COVID-19 in adults under an EUA. For more information on EUA, see the "What is an Emergency  Use Authorization (EUA)?" section at the end of this Fact Sheet. LAGEVRIO is not authorized: ? for use in people less than 72 years of age. ? for prevention of COVID-19. ? for people needing hospitalization for COVID-19. ? for use for longer than 5 consecutive days.  What should I tell my healthcare provider before I take LAGEVRIO? Tell your healthcare provider if you: ? Have any allergies ? Are breastfeeding or plan to breastfeed ? Have any serious illnesses ? Are taking any medicines (prescription,  over-the-counter, vitamins, or herbal products).  How do I take LAGEVRIO? ? Take LAGEVRIO exactly as your healthcare provider tells you to take it. ? Take 4 capsules of LAGEVRIO every 12 hours (for example, at 8 am and at 8 pm) ? Take LAGEVRIO for 5 days. It is important that you complete the full 5 days of treatment with LAGEVRIO. Do not stop taking LAGEVRIO before you complete the full 5 days of treatment, even if you feel better. ? Take LAGEVRIO with or without food. ? You should stay in isolation for as long as your healthcare provider tells you to. Talk to your healthcare provider if you are not sure about how to properly isolate while you have COVID-19. ? Swallow LAGEVRIO capsules whole. Do not open, break, or crush the capsules. If you cannot swallow capsules whole, tell your healthcare provider. ? What to do if you miss a dose: o If it has been less than 10 hours since the missed dose, take it as soon as you remember o If it has been more than 10 hours since the missed dose, skip the missed dose and take your dose at the next scheduled time. ? Do not double the dose of LAGEVRIO to make up for a missed dose.  What are the important possible side effects of LAGEVRIO? ? See, "What is the most important information I should know about LAGEVRIO?" ? Allergic Reactions. Allergic reactions can happen in people taking LAGEVRIO, even after only 1 dose. Stop taking LAGEVRIO and call your healthcare provider right away if you get any of the following symptoms of an allergic reaction: o hives o rapid heartbeat o trouble swallowing or breathing o swelling of the mouth, lips, or face o throat tightness o hoarseness o skin rash The most common side effects of LAGEVRIO are: ? diarrhea ? nausea ? dizziness These are not all the possible side effects of LAGEVRIO. Not many people have taken LAGEVRIO. Serious and unexpected side effects may happen. This medicine is still being studied, so  it is possible that all of the risks are not known at this time.  What other treatment choices are there?  Veklury (remdesivir) is FDA-approved as an intravenous (IV) infusion for the treatment of mildto-moderate [id number]-07 in certain adults and children. Talk with your doctor to see if Marijean Heath is appropriate for you. Like LAGEVRIO, FDA may also allow for the emergency use of other medicines to treat people with COVID-19. Go to [email or web address] for more information. It is your choice to be treated or not to be treated with LAGEVRIO. Should you decide not to take it, it will not change your standard medical care.  What if I am breastfeeding? Breastfeeding is not recommended during treatment with LAGEVRIO and for 4 days after the last dose of LAGEVRIO. If you are breastfeeding or plan to breastfeed, talk to your healthcare provider about your options and specific situation before taking LAGEVRIO.  How do I report side effects with LAGEVRIO? Contact your  healthcare provider if you have any side effects that bother you or do not go away. Report side effects to FDA MedWatch at SmoothHits.hu or call 1-800-FDA-1088 (1- 919-572-0312).  How should I store [place]? ? Store LAGEVRIO capsules at room temperature between 26F to 11F (20C to 25C). ? Keep LAGEVRIO and all medicines out of the reach of children and pets. How can I learn more about COVID-19? ? Ask your healthcare provider. ? Visit SeekRooms.co.uk ? Contact your local or state public health department. ? Call [place] at [phone] (toll free in the [place] ? Visit [email or web address].[email or web address]  What Is an Emergency Use Authorization (EUA)? The [place] FDA has made [place] available under an emergency access mechanism called an Emergency Use Authorization (EUA) The EUA is supported by a Presenter, broadcasting  Health and Human Service (HHS) declaration that circumstances exist to justify emergency use of drugs and biological products during the COVID-19 pandemic. LAGEVRIO for the treatment of mild-to-moderate COVID-19 in adults with positive results of direct SARS-CoV-2 viral testing, who are at high risk for progression to severe COVID-19, including hospitalization or death, and for whom alternative COVID-19 treatment options approved or authorized by FDA are not accessible or clinically appropriate, has not undergone the same type of review as an FDA-approved product. In issuing an EUA under the TIRWE-31 public health emergency, the FDA has determined, among other things, that based on the total amount of scientific evidence available including data from adequate and well-controlled clinical trials, if available, it is reasonable to believe that the product may be effective for diagnosing, treating, or preventing COVID-19, or a serious or life-threatening disease or condition caused by COVID-19; that the known and potential benefits of the product, when used to diagnose, treat, or prevent such disease or condition, outweigh the known and potential risks of such product; and that there are no adequate, approved, and available alternatives.  All of these criteria must be met to allow for the product to be used in the treatment of patients during the COVID-19 pandemic. The EUA for LAGEVRIO is in effect for the duration of the COVID-19 declaration justifying emergency use of LAGEVRIO, unless terminated or revoked (after which LAGEVRIO may no longer be used under the EUA). For patent information: http://rogers.info/ Copyright  2021-2022 [employer] [place], [place] and its affiliates. All rights reserved. [id number] Revised: March 2022

## 2021-02-16 ENCOUNTER — Telehealth: Payer: PPO | Admitting: Family Medicine

## 2021-02-24 ENCOUNTER — Ambulatory Visit (INDEPENDENT_AMBULATORY_CARE_PROVIDER_SITE_OTHER): Payer: PPO

## 2021-02-24 ENCOUNTER — Other Ambulatory Visit: Payer: Self-pay

## 2021-02-24 DIAGNOSIS — Z5181 Encounter for therapeutic drug level monitoring: Secondary | ICD-10-CM

## 2021-02-24 DIAGNOSIS — Z79899 Other long term (current) drug therapy: Secondary | ICD-10-CM

## 2021-02-24 DIAGNOSIS — I8290 Acute embolism and thrombosis of unspecified vein: Secondary | ICD-10-CM

## 2021-02-24 LAB — POCT INR: INR: 3 (ref 2.0–3.0)

## 2021-02-24 NOTE — Patient Instructions (Addendum)
Description   Continue on same dosage of Warfarin 1.5 tablets daily except 1 tablet on Fridays.  Recheck in 4 weeks.  [facility] [phone], Main number [phone].

## 2021-03-23 NOTE — Progress Notes (Addendum)
Phone: [phone]   Subjective:  Patient presents today for their annual physical. Chief complaint-noted.   See problem oriented charting- ROS- full  review of systems was completed and negative  except for: stable tinnitus, constipation- metamucil helping, joint pain, back pain, neck stiffness  The following were reviewed and entered/updated in epic: Past Medical History:  Diagnosis Date   Aortic atherosclerosis ([place])    Aortic valve disease    a. severe aortic stenosis s/p TAVR 09/2017.   Arthritis    "spinal" (11/16/2015)   Atypical nevus 05/14/2013   moderate atypia - left scapula   Colon cancer (HCC)    precancer-partial colectomy   Dilated aortic root (HCC)    Elevated HDL    First degree AV block    First degree AV block    Gout    Heart murmur    History of kidney stones    Hyperlipidemia    Hypertension    LBBB (left bundle branch block)    Nonobstructive atherosclerosis of coronary artery    Squamous cell skin cancer 08/04/2011   Left outer brow - CX3 + 5FU   Thoracic aortic aneurysm    Patient Active Problem List   Diagnosis Date Noted   Ascending aortic aneurysm 03/26/2020    Priority: 1.   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   Aortic atherosclerosis ([place]) 03/26/2020    Priority: 2.   B12 deficiency 09/23/2019    Priority: 2.   Chronic diastolic CHF (congestive heart failure) ([place]) 03/30/2017    Priority: 2.   Abdominal aortic ectasia ([place]) 04/07/2016    Priority: 2.   HYPERCHOLESTEROLEMIA WITH HIGH HDL 01/31/2007    Priority: 2.   Gout 01/31/2007    Priority: 2.   Essential hypertension 01/31/2007    Priority: 2.   History of malignant neoplasm of large intestine 01/31/2007    Priority: 2.   Osteoarthritis of cervical spine 08/31/2015    Priority: 3.   Osteoarthritis of left knee 05/25/2014    Priority: 3.   Thrombus 05/31/2020    Priority: 1.   Medication management  12/02/2020   Encounter for therapeutic drug monitoring 05/31/2020   Macrocytic anemia 03/21/2019   Kidney stones    Throat pain in adult 08/31/2015   Past Surgical History:  Procedure Laterality Date   BACK SURGERY     CATARACT EXTRACTION W/ INTRAOCULAR LENS  IMPLANT, BILATERAL Bilateral 2017   COLONOSCOPY W/ POLYPECTOMY     FACET JOINT INJECTION  2009   LITHOTRIPSY  2010   LUMBAR LAMINECTOMY  ~ [place]   precancerours   PARTIAL KNEE ARTHROPLASTY Left 11/16/2015   Procedure: LEFT KNEE UNICOMPARTMENTAL ;  Surgeon: [name], MD;  Location: [place];  Service: Orthopedics;  Laterality: Left;   REPLACEMENT UNICONDYLAR JOINT KNEE Left 11/16/2015   RIGHT/LEFT HEART CATH AND CORONARY ANGIOGRAPHY N/A 08/10/2017   Procedure: RIGHT/LEFT HEART CATH AND CORONARY ANGIOGRAPHY;  Surgeon: [name], MD;  Location: [place] [facility];  Service: Cardiovascular;  Laterality: N/A;   TEE WITHOUT CARDIOVERSION N/A 09/25/2017   Procedure: TRANSESOPHAGEAL ECHOCARDIOGRAM (TEE);  Surgeon: [name], MD;  Location: [place];  Service: Open Heart Surgery;  Laterality: N/A;   TONSILLECTOMY     TRANSCATHETER AORTIC VALVE REPLACEMENT, TRANSFEMORAL N/A 09/25/2017   Procedure: TRANSCATHETER AORTIC VALVE REPLACEMENT, TRANSFEMORAL;  Surgeon: [name], MD;  Location: [place];  Service: Open  Heart Surgery;  Laterality: N/A;   TYMPANOPLASTY Left 1998   ruptured ear drum with skin graft    Family History  Problem Relation Age of Onset   Heart attack Mother 27       former smoker   Heart attack Father 66       former smoker   Hypertension Sister    Heart attack Brother 24       Rheumatic fever    Medications- reviewed and updated Current Outpatient Medications  Medication Sig Dispense Refill   allopurinol (ZYLOPRIM) 300 MG tablet TAKE 1 TABLET BY MOUTH EVERY DAY (Patient taking differently: Take 300 mg by mouth daily.) 90 tablet 3   amLODipine (NORVASC)  10 MG tablet TAKE 1 TABLET BY MOUTH EVERY DAY 90 tablet 3   amoxicillin (AMOXIL) 500 MG tablet Take 4 tablets (2,000 mg total) by mouth as directed. 1 hour prior to dental work including cleanings 8 tablet 2   cyanocobalamin 1000 MCG tablet Take 1,000 mcg by mouth daily.     diphenhydrAMINE (BENADRYL) 25 mg capsule Take 25 mg by mouth at bedtime as needed for sleep.     furosemide (LASIX) 20 MG tablet Take 1 tablet (20 mg total) by mouth daily. 90 tablet 3   hydrochlorothiazide (MICROZIDE) 12.5 MG capsule Take 1 capsule (12.5 mg total) by mouth daily. (Patient taking differently: Take 12.5 mg by mouth daily at 12 noon.) 90 capsule 3   potassium citrate (UROCIT-K) 10 MEQ (1080 MG) SR tablet Take 1 tablet (10 mEq total) by mouth 3 (three) times daily with meals. 300 tablet 1   quinapril (ACCUPRIL) 40 MG tablet Take 1 tablet (40 mg total) by mouth daily. 90 tablet 3   warfarin (COUMADIN) 5 MG tablet Take 1 to 1.5 tablets once daily as directed by anticoagulation clinic 45 tablet 3   No current facility-administered medications for this visit.    Allergies-reviewed and updated No Known Allergies  Social History   Social History Narrative   Married. 5 children. 4 grandkids. 1 greatgrandkid.       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       Hobbies: woodworking      Advised to consider advanced directives/hcpoa-thinks wife may have 55 from years ago.   Objective  Objective:  BP 116/60   Pulse 61   Temp 97.8 F (36.6 C) (Temporal)   Ht 6' (1.829 m)   Wt 230 lb 6.4 oz (104.5 kg)   SpO2 97%   BMI 31.25 kg/m  Gen: NAD, resting comfortably HEENT: Mucous membranes are moist. Oropharynx normal Neck: no thyromegaly CV: RRR stable murmur Lungs: CTAB no crackles, wheeze, rhonchi Abdomen: soft/nontender/nondistended/normal bowel sounds. No rebound or guarding.  Ext: no edema Skin: warm, dry Neuro: grossly normal, moves all extremities, PERRLA    Assessment and  Plan  87 y.o. male presenting for annual physical.  Health Maintenance counseling: 1. Anticipatory guidance: Patient counseled regarding regular dental exams -q6 months, eye exams -yearly,  avoiding smoking and second hand smoke , limiting alcohol to 2 beverages per day . No illicit drugs  2. Risk factor reduction:  Advised patient of need for regular exercise and diet rich and fruits and vegetables to reduce risk of heart attack and stroke. Exercise-  doing some walking with weather being better and doing outdoor work- goal 150 mins a week.  Diet-peak weight of 246 in 2020-patient has done a good job maintaining some weight loss-weight has shifted up some from last  visit- he plans to make adjustments to get back down 5 lbs Wt Readings from Last 3 Encounters:  03/23/21 230 lb 6.4 oz (104.5 kg)  10/18/20 224 lb 12.8 oz (102 kg)  09/27/20 228 lb (103.4 kg)  3. Immunizations/screenings/ancillary studies DISCUSSED:  -Flu vaccination (last one 9/21) - declined-wants to get at later date with wife -COVID vaccination repeat planned - recommended bialent vaccination # History of COVID-19 - tested positive 02/13/2021 and wife was positive as well.  Discussed needs to postpone vaccination for bivalent vaccination at least 3 months. Overall was mild.  Immunization History  Administered Date(s) Administered   Fluad Quad(high Dose 65+) 03/05/2020   Influenza, High Dose Seasonal PF 03/26/2014, 04/08/2017, 03/20/2018, 02/03/2019, 03/04/2019   Influenza,inj,Quad PF,6+ Mos 03/14/2013   Influenza-Unspecified 04/06/2015, 03/02/2016, 02/28/2017, 03/20/2018   PFIZER Comirnaty(Gray Top)Covid-19 Tri-Sucrose Vaccine 11/18/2020   PFIZER(Purple Top)SARS-COV-2 Vaccination 07/09/2019, 07/28/2019, 03/17/2020   Pneumococcal Conjugate-13 05/25/2014   Pneumococcal Polysaccharide-23 06/19/2004   Td 12/27/2007   Tdap 05/22/2018   Zoster Recombinat (Shingrix) 06/19/2018, 12/26/2018   Zoster, Live 06/19/2008  4. Prostate  cancer screening- stable nocturia twice a night.  Past age based screening recommendations but still does this with Dr. Jeffie Pollock.  Follows with Dr. Jeffie Pollock for kidney stones    5. Colon cancer screening - past age based screening recommendations.  Partial colectomy for precancerous polyps around 2011 and was told no more colonoscopies see letter from Dr. Loletha Carrow August 17, 2016 6. Skin cancer screening- follows with [facility] Dr. Marlou Sa regular sunscreen use. Had some spots removed recently 7. Smoking associated screening (lung cancer screening, AAA screen 36-75 past age, UA)- Former smoker- quit in 79s.  Urine is followed by urology 8. 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 Tested last week    #Status TAVR with bioprosthesis followed with Dr. Angelena Form -due to severe aortic stenosis.  Patient continued to do well post surgery.  Cardiology recommended dental prophylaxis.  Continued cardiology follow-up- visit in November 2021.   Appears currently scheduled for November visit    # aortic aneurysm ascending -remains stable on MR angiogram of chest on 12/02/2020 through cardiology.  Continue blood pressure control   #Chronic diastolic CHF S: Patient with stable baseline shortness of breath.  No increased edema or shortness of breath.  Weight trending up slightly  - Lasix 20 mg daily A/P: overall appears euvolemic (monitor weight but think up from dietary_ 0 continue current meds as appears stable   #Abdominal aortic ectasia-this was noted during urology work-up in October 2017-5-year follow-up recommended-hea ctually had a ct angio abd/pelvis 09/03/17 and this did not show significant report of ectasia  will discontinue screening    #hyperlipidemia/aortic atherosclerosis #Also has nonobstructive CAD  S: Medication:None-patient was wanted to work  on diet and exercise instead of starting medicine Lab Results  Component Value Date   CHOL 202 (H) 03/26/2020   HDL 71 03/26/2020   LDLCALC 108 (H) 03/26/2020   LDLDIRECT 136.2 10/25/2012   TRIG 118 03/26/2020   CHOLHDL 2.8 03/26/2020   A/P: Update lipid panel today-discussed LDL goal 70 or less due to aortic atherosclerosis- hed be open to once a week statin to try to slowly start working LDL down  #Gout S: 0  flares in last 6  months on allopurinol 300Mg  Lab Results  Component Value Date   LABURIC 4.5 03/26/2020   A/P:update  uric acid today. Appears stable- continue current meds   # B12 deficiency S: Current treatment/medication (oral vs. IM): Injection previously but wanted to try oral Lab Results  Component Value Date   VITAMINB12 318 09/27/2020  A/P: Hopefully maintaining B12 level-update with labs today. Continue current meds for now    #hypertension S: medication: Quinapril 40Mg  daily, HCTZ 12.5Mg  daily, Amlodpine 10Mg  daily, lasix 20 mg daily Home readings #s: looks good at home as well A/P: Controlled. Continue current medications.   #Constipation-doing well on miralax as needed - has needed recently   #Macrocytic anemia-with B12 deficiency in the past but persisted even with repletion of B12.   saw hematology 10/18/20 with 8 year repeat  Recommended follow up: No follow-ups on file. Future Appointments  Date Time Provider [place]  05/04/2021  3:40 PM [name], MD [facility]  05/04/2021  4:00 PM [facility]  06/21/2021 11:30 AM [facility] PHLEBOTOMIST [facility] None  06/21/2021 12:00 PM Ladell Pier, MD [facility] None  07/25/2021  3:00 PM [facility] PHARMACIST [facility]   Lab/Order associations:NOT fasting   ICD-10-CM   1. Preventative health care  Z00.00     2. Chronic diastolic CHF (congestive heart failure) (HCC)  I50.32     3. Aortic atherosclerosis (HCC)  I70.0     4.  Hyperlipidemia, unspecified hyperlipidemia type  E78.5 CBC with Differential/Platelet    Comprehensive metabolic panel    Lipid panel    5. Essential hypertension  I10 CBC with Differential/Platelet    Comprehensive metabolic panel    Lipid panel    6. Aortic ectasia (HCC)  I77.819     7. Aneurysm of ascending aorta without rupture  I71.21     8. B12 deficiency  E53.8 Vitamin B12    9. Idiopathic chronic gout of multiple sites without tophus  M1A.38B0 Uric acid    10. Chronic bilateral thoracic back pain  M54.6    G89.29      No orders of the defined types were placed in this encounter.  I,Jada Bradford,acting as a scribe for Garret Reddish, MD.,have documented all relevant documentation on the behalf of Garret Reddish, MD,as directed by  Garret Reddish, MD while in the presence of Garret Reddish, MD.  I, Garret Reddish, MD, have reviewed all documentation for this visit. The documentation on 04/01/21 for the exam, diagnosis, procedures, and orders are all accurate and complete.   Return precautions advised.  Garret Reddish, MD

## 2021-03-23 NOTE — Patient Instructions (Addendum)
Health Maintenance Due  Topic Date Due   COVID-19 Vaccine (5 - Booster for [employer] series)   -Patient plans to get the Bivalent booster in December. He will update [place] with this information.   -Since you previously had covid, hold off from getting booster shot until 3 more months.  03/20/2021   Please stop by lab before you go If you have mychart- we will send your results within 3 business days of [place] receiving them.  If you do not have mychart- we will call you about results within 5 business days of [place] receiving them.  *please also note that you will see labs on mychart as soon as they post. I will later go in and write notes on them- will say "notes from Dr. Yong Channel"  I recommend exercising 150 minutes a week!  Recommended follow up: No follow-ups on file.

## 2021-03-24 ENCOUNTER — Other Ambulatory Visit: Payer: Self-pay

## 2021-03-24 ENCOUNTER — Ambulatory Visit (INDEPENDENT_AMBULATORY_CARE_PROVIDER_SITE_OTHER): Payer: PPO

## 2021-03-24 ENCOUNTER — Other Ambulatory Visit: Payer: Self-pay | Admitting: Family Medicine

## 2021-03-24 DIAGNOSIS — I8290 Acute embolism and thrombosis of unspecified vein: Secondary | ICD-10-CM | POA: Diagnosis not present

## 2021-03-24 DIAGNOSIS — Z5181 Encounter for therapeutic drug level monitoring: Secondary | ICD-10-CM

## 2021-03-24 LAB — POCT INR: INR: 2.3 (ref 2.0–3.0)

## 2021-03-24 NOTE — Patient Instructions (Signed)
Description   Continue on same dosage of Warfarin 1.5 tablets daily except 1 tablet on Fridays. Recheck in 5 weeks.  [facility] [phone], Main number [phone].

## 2021-04-01 ENCOUNTER — Other Ambulatory Visit: Payer: Self-pay

## 2021-04-01 ENCOUNTER — Ambulatory Visit (INDEPENDENT_AMBULATORY_CARE_PROVIDER_SITE_OTHER): Payer: PPO | Admitting: Family Medicine

## 2021-04-01 ENCOUNTER — Encounter: Payer: Self-pay | Admitting: Family Medicine

## 2021-04-01 VITALS — BP 116/60 | HR 61 | Temp 97.8°F | Ht 72.0 in | Wt 230.4 lb

## 2021-04-01 DIAGNOSIS — I700 Atherosclerosis of aorta: Secondary | ICD-10-CM | POA: Diagnosis not present

## 2021-04-01 DIAGNOSIS — Z0000 Encounter for general adult medical examination without abnormal findings: Secondary | ICD-10-CM

## 2021-04-01 DIAGNOSIS — M1A09X0 Idiopathic chronic gout, multiple sites, without tophus (tophi): Secondary | ICD-10-CM | POA: Diagnosis not present

## 2021-04-01 DIAGNOSIS — I77819 Aortic ectasia, unspecified site: Secondary | ICD-10-CM | POA: Diagnosis not present

## 2021-04-01 DIAGNOSIS — I10 Essential (primary) hypertension: Secondary | ICD-10-CM | POA: Diagnosis not present

## 2021-04-01 DIAGNOSIS — E785 Hyperlipidemia, unspecified: Secondary | ICD-10-CM | POA: Diagnosis not present

## 2021-04-01 DIAGNOSIS — M546 Pain in thoracic spine: Secondary | ICD-10-CM

## 2021-04-01 DIAGNOSIS — Z953 Presence of xenogenic heart valve: Secondary | ICD-10-CM

## 2021-04-01 DIAGNOSIS — I5032 Chronic diastolic (congestive) heart failure: Secondary | ICD-10-CM | POA: Diagnosis not present

## 2021-04-01 DIAGNOSIS — I7121 Aneurysm of the ascending aorta, without rupture: Secondary | ICD-10-CM

## 2021-04-01 DIAGNOSIS — G8929 Other chronic pain: Secondary | ICD-10-CM

## 2021-04-01 DIAGNOSIS — E538 Deficiency of other specified B group vitamins: Secondary | ICD-10-CM | POA: Diagnosis not present

## 2021-04-01 DIAGNOSIS — D539 Nutritional anemia, unspecified: Secondary | ICD-10-CM

## 2021-04-01 LAB — COMPREHENSIVE METABOLIC PANEL
ALT: 13 U/L (ref 0–53)
AST: 17 U/L (ref 0–37)
Albumin: 3.8 g/dL (ref 3.5–5.2)
Alkaline Phosphatase: 61 U/L (ref 39–117)
BUN: 25 mg/dL — ABNORMAL HIGH (ref 6–23)
CO2: 30 mEq/L (ref 19–32)
Calcium: 9.4 mg/dL (ref 8.4–10.5)
Chloride: 101 mEq/L (ref 96–112)
Creatinine, Ser: 1.2 mg/dL (ref 0.40–1.50)
GFR: 54.88 mL/min — ABNORMAL LOW (ref >60.00)
Glucose, Bld: 85 mg/dL (ref 70–99)
Potassium: 4.1 mEq/L (ref 3.5–5.1)
Sodium: 137 mEq/L (ref 135–145)
Total Bilirubin: 0.5 mg/dL (ref 0.2–1.2)
Total Protein: 6.7 g/dL (ref 6.0–8.3)

## 2021-04-01 LAB — CBC WITH DIFFERENTIAL/PLATELET
Basophils Absolute: 0 10*3/uL (ref 0.0–0.1)
Basophils Relative: 0.6 % (ref 0.0–3.0)
Eosinophils Absolute: 0.2 10*3/uL (ref 0.0–0.7)
Eosinophils Relative: 5 % (ref 0.0–5.0)
HCT: 39 % (ref 39.0–52.0)
Hemoglobin: 13.1 g/dL (ref 13.0–17.0)
Lymphocytes Relative: 35.1 % (ref 12.0–46.0)
Lymphs Abs: 1.2 10*3/uL (ref 0.7–4.0)
MCHC: 33.5 g/dL (ref 30.0–36.0)
MCV: 107.4 fl — ABNORMAL HIGH (ref 78.0–100.0)
Monocytes Absolute: 0.4 10*3/uL (ref 0.1–1.0)
Monocytes Relative: 12.3 % — ABNORMAL HIGH (ref 3.0–12.0)
Neutro Abs: 1.6 10*3/uL (ref 1.4–7.7)
Neutrophils Relative %: 47 % (ref 43.0–77.0)
Platelets: 231 10*3/uL (ref 150.0–400.0)
RBC: 3.63 Mil/uL — ABNORMAL LOW (ref 4.22–5.81)
RDW: 16 % — ABNORMAL HIGH (ref 11.5–15.5)
WBC: 3.4 10*3/uL — ABNORMAL LOW (ref 4.0–10.5)

## 2021-04-01 LAB — LIPID PANEL
Cholesterol: 197 mg/dL (ref 0–200)
HDL: 61.5 mg/dL (ref >39.00)
LDL Cholesterol: 112 mg/dL — ABNORMAL HIGH (ref 0–99)
NonHDL: 135.65
Total CHOL/HDL Ratio: 3
Triglycerides: 119 mg/dL (ref 0.0–149.0)
VLDL: 23.8 mg/dL (ref 0.0–40.0)

## 2021-04-01 LAB — VITAMIN B12: Vitamin B-12: 584 pg/mL (ref 211–911)

## 2021-04-01 LAB — URIC ACID: Uric Acid, Serum: 5.3 mg/dL (ref 4.0–7.8)

## 2021-04-08 ENCOUNTER — Telehealth: Payer: Self-pay | Admitting: Pharmacist

## 2021-04-08 NOTE — Chronic Care Management (AMB) (Signed)
Chronic Care Management Pharmacy Assistant   Name: Barry Decker  MRN: [id number] DOB: 05/28/1933   Reason for Encounter: Hypertension Adherence Call    Recent office visits:  04/01/2021 OV (PCP) [name], MD; no medication changes indicated.  02/15/2021 VV Lucretia Kern, DO; After lengthy discussion, the patient opted for treatment with molnupiravir due to being higher risk for complications of covid or severe disease and other factors.  He opted against Paxlovid given the potential interactions with his warfarin  Recent consult visits:  None  Hospital visits:  None in previous 6 months  Medications: Outpatient Encounter Medications as of 04/08/2021  Medication Sig   allopurinol (ZYLOPRIM) 300 MG tablet TAKE 1 TABLET BY MOUTH EVERY DAY (Patient taking differently: Take 300 mg by mouth daily.)   amLODipine (NORVASC) 10 MG tablet TAKE 1 TABLET BY MOUTH EVERY DAY   amoxicillin (AMOXIL) 500 MG tablet Take 4 tablets (2,000 mg total) by mouth as directed. 1 hour prior to dental work including cleanings   cyanocobalamin 1000 MCG tablet Take 1,000 mcg by mouth daily.   diphenhydrAMINE (BENADRYL) 25 mg capsule Take 25 mg by mouth at bedtime as needed for sleep.   furosemide (LASIX) 20 MG tablet Take 1 tablet (20 mg total) by mouth daily.   hydrochlorothiazide (MICROZIDE) 12.5 MG capsule Take 1 capsule (12.5 mg total) by mouth daily. (Patient taking differently: Take 12.5 mg by mouth daily at 12 noon.)   potassium citrate (UROCIT-K) 10 MEQ (1080 MG) SR tablet Take 1 tablet (10 mEq total) by mouth 3 (three) times daily with meals.   quinapril (ACCUPRIL) 40 MG tablet Take 1 tablet (40 mg total) by mouth daily.   warfarin (COUMADIN) 5 MG tablet Take 1 to 1.5 tablets once daily as directed by anticoagulation clinic   No facility-administered encounter medications on file as of 04/08/2021.   Reviewed chart prior to disease state call. Spoke with patient regarding BP  Recent  Office Vitals: BP Readings from Last 3 Encounters:  03/23/21 116/60  10/18/20 135/75  09/27/20 122/60   Pulse Readings from Last 3 Encounters:  03/23/21 61  10/18/20 67  09/27/20 62    Wt Readings from Last 3 Encounters:  03/23/21 230 lb 6.4 oz (104.5 kg)  10/18/20 224 lb 12.8 oz (102 kg)  09/27/20 228 lb (103.4 kg)     Kidney Function Lab Results  Component Value Date/Time   CREATININE 1.20 04/01/2021 10:00 AM   CREATININE 1.02 11/11/2020 10:44 AM   CREATININE 0.99 03/26/2020 11:28 AM   CREATININE 0.92 04/20/2016 12:08 PM   GFR 54.88 (L) 04/01/2021 10:00 AM   GFRNONAA 61 05/11/2020 09:45 AM   GFRNONAA 69 03/26/2020 11:28 AM   GFRAA 70 05/11/2020 09:45 AM   GFRAA 80 03/26/2020 11:28 AM    BMP Latest Ref Rng & Units 04/01/2021 11/11/2020 09/27/2020  Glucose 70 - 99 mg/dL 85 97 87  BUN 6 - 23 mg/dL 25(H) 21 15  Creatinine 0.40 - 1.50 mg/dL 1.20 1.02 1.05  BUN/Creat Ratio 10 - 24 - 21 -  Sodium 135 - 145 mEq/L 137 139 140  Potassium 3.5 - 5.1 mEq/L 4.1 4.7 4.7  Chloride 96 - 112 mEq/L 101 103 105  CO2 19 - 32 mEq/L 30 28 30   Calcium 8.4 - 10.5 mg/dL 9.4 9.8 9.3    Current antihypertensive regimen:  Amlodipine 10 mg daily Quinapril 40 mg daily HCTZ 12.5 mg daily Furosemide 20 mg daily  How often are you  checking your Blood Pressure? weekly  Current home BP readings: 120/60  What recent interventions/DTPs have been made by any provider to improve Blood Pressure control since last CPP Visit: No recent interventions or DTPs.  Any recent hospitalizations or ED visits since last visit with CPP? No  What diet changes have been made to improve Blood Pressure Control?  Patient states he hasn't made any recent changes but he eats healthy meals.  What exercise is being done to improve your Blood Pressure Control?  Patient states he likes to walk  Adherence Review: Is the patient currently on ACE/ARB medication? Yes Does the patient have >5 day gap between last  estimated fill dates? No   Care Gaps: Medicare Annual Wellness: Due now - scheduled for 05/27/2021 Hemoglobin A1C: 5.2% on 09/21/2017 Colonoscopy: Aged out, completed 10/22/2009   Future Appointments  Date Time Provider [place]  05/04/2021  3:40 PM [name], MD [facility]  05/04/2021  4:00 PM [facility]  06/21/2021 11:30 AM [facility] PHLEBOTOMIST [facility] None  06/21/2021 12:00 PM Ladell Pier, MD [facility] None  07/25/2021  3:00 PM [facility] PHARMACIST [facility]  10/05/2021 10:00 AM [name], MD [facility]  04/04/2022  9:40 AM [name], MD [facility]    Star Rating Drugs: Quinapril 40 mg last filled 03/25/2021 90 DS  April D Calhoun, [place] Pharmacist Assistant [phone]

## 2021-04-15 ENCOUNTER — Other Ambulatory Visit: Payer: Self-pay | Admitting: Cardiovascular Disease

## 2021-04-15 ENCOUNTER — Other Ambulatory Visit: Payer: Self-pay | Admitting: Physician Assistant

## 2021-04-15 ENCOUNTER — Other Ambulatory Visit: Payer: Self-pay | Admitting: Family Medicine

## 2021-04-27 DIAGNOSIS — N281 Cyst of kidney, acquired: Secondary | ICD-10-CM | POA: Diagnosis not present

## 2021-04-27 DIAGNOSIS — N3941 Urge incontinence: Secondary | ICD-10-CM | POA: Diagnosis not present

## 2021-04-27 DIAGNOSIS — R3912 Poor urinary stream: Secondary | ICD-10-CM | POA: Diagnosis not present

## 2021-04-27 DIAGNOSIS — R351 Nocturia: Secondary | ICD-10-CM | POA: Diagnosis not present

## 2021-04-27 DIAGNOSIS — N401 Enlarged prostate with lower urinary tract symptoms: Secondary | ICD-10-CM | POA: Diagnosis not present

## 2021-04-27 DIAGNOSIS — N200 Calculus of kidney: Secondary | ICD-10-CM | POA: Diagnosis not present

## 2021-05-04 ENCOUNTER — Other Ambulatory Visit: Payer: Self-pay

## 2021-05-04 ENCOUNTER — Ambulatory Visit (INDEPENDENT_AMBULATORY_CARE_PROVIDER_SITE_OTHER): Payer: PPO

## 2021-05-04 ENCOUNTER — Ambulatory Visit: Payer: PPO | Admitting: Cardiovascular Disease

## 2021-05-04 ENCOUNTER — Encounter: Payer: Self-pay | Admitting: Cardiovascular Disease

## 2021-05-04 VITALS — BP 130/70 | HR 67 | Ht 72.0 in | Wt 228.8 lb

## 2021-05-04 DIAGNOSIS — I447 Left bundle-branch block, unspecified: Secondary | ICD-10-CM

## 2021-05-04 DIAGNOSIS — Z952 Presence of prosthetic heart valve: Secondary | ICD-10-CM

## 2021-05-04 DIAGNOSIS — I5032 Chronic diastolic (congestive) heart failure: Secondary | ICD-10-CM

## 2021-05-04 DIAGNOSIS — I8290 Acute embolism and thrombosis of unspecified vein: Secondary | ICD-10-CM | POA: Diagnosis not present

## 2021-05-04 DIAGNOSIS — I7121 Aneurysm of the ascending aorta, without rupture: Secondary | ICD-10-CM | POA: Diagnosis not present

## 2021-05-04 DIAGNOSIS — Z5181 Encounter for therapeutic drug level monitoring: Secondary | ICD-10-CM | POA: Diagnosis not present

## 2021-05-04 LAB — POCT INR: INR: 1.8 — AB (ref 2.0–3.0)

## 2021-05-04 MED ORDER — WARFARIN SODIUM 5 MG PO TABS: ORAL_TABLET | ORAL | 3 refills | Status: DC

## 2021-05-04 NOTE — Telephone Encounter (Signed)
Prescription refill request received for warfarin [place]: 05/04/21 Angelena Form)  Next INR check: 06/09/21 Warfarin tablet strength: 5mg   Appropriate dose and refill sent to requested pharmacy.

## 2021-05-04 NOTE — Patient Instructions (Addendum)
Medication Instructions:  No changes *If you need a refill on your cardiac medications before your next appointment, please call your pharmacy*   Lab Work: none If you have labs (blood work) drawn today and your tests are completely normal, you will receive your results only by: [place] (if you have MyChart) OR A paper copy in the mail If you have any lab test that is abnormal or we need to change your treatment, we will call you to review the results.   Testing/Procedures: DUE IN MAY 2023 BEFORE NEXT VISIT WITH DR. Angelena Form  Your physician has requested that you have an echocardiogram. Echocardiography is a painless test that uses sound waves to create images of your heart. It provides your doctor with information about the size and shape of your heart and how well your heart's chambers and valves are working. This procedure takes approximately one hour. There are no restrictions for this procedure.   Follow-Up: At [facility], you and your health needs are our priority.  As part of our continuing mission to provide you with exceptional heart care, we have created designated Provider Care Teams.  These Care Teams include your primary Cardiologist (physician) and Advanced Practice Providers (APPs -  Physician Assistants and Nurse Practitioners) who all work together to provide you with the care you need, when you need it.  Your next appointment:   6 month(s)  The format for your next appointment:   In Person  Provider:   Lauree Chandler, MD     Other Instructions

## 2021-05-04 NOTE — Patient Instructions (Signed)
Description   Take an extra 0.5 tablet today and extra 0.5 tablet tomorrow and then continue on same dosage of Warfarin 1.5 tablets daily except 1 tablet on Fridays. Recheck in 5 weeks.  [facility] Clinic [phone], Main number [phone].

## 2021-05-04 NOTE — Progress Notes (Signed)
Chief Complaint  Patient presents with   Follow-up    Aortic valve disease    History of Present Illness: 87 yo male with history of HTN, HLD, dilated aortic root and severe aortic stenosis s/p TAVR April 2019 who is here today for follow up. He had rheumatic fever as a child and was followed for aortic stenosis for many years. Echo 07/12/17 showed normal LV systolic function with NOMV=67-20% and severe aortic stenosis. His aortic root was enlarged. Chest CTA 07/23/17 with dilated ascending thoracic aorta with proximal to mid ascending thoracic aorta up to 4.6 cm. Cardiac cath February 2019 with mild non-obstructive CAD. He had TAVR 09/25/17 with placement of a 29 mm Edwards Sapien 3 valve from the transfemoral approach. Echo May 2019 with normal LV systolic function and normally functioning bioprosthetic AVR with mild perivalvular AI. Echo November 2021 with LVEF=50%. Elevated gradient across aortic bioprosthetic valve. Gated cardiac CTA December 2021 with stable ascending aortic aneurysm around 4.6-4.8 cm. Mild CAD. Edwards Sapien 3 THV with hypo-attenuating leaflet thickening (HALT) and hypo attenuation affecting motion (HAM). Coumadin was started. Echo February 2022 with AV gradient down to 10 mmHg. LVEF=55-60%. His coumadin was stopped after six months. Echo in June 2022 with elevated aortic valve gradient 19 mmHg. Coumadin was restarted. Chest MRA June 2022 with stable 4.6 cm ascending aortic aneurysm.   He is here today for follow up. The patient denies any chest pain, dyspnea, palpitations, lower extremity edema, orthopnea, PND, dizziness, near syncope or syncope.       Primary Care Physician: [name], MD  Past Medical History:  Diagnosis Date   Aortic atherosclerosis [facility])    Aortic valve disease    a. severe aortic stenosis s/p TAVR 09/2017.   Arthritis    "spinal" (11/16/2015)   Atypical nevus 05/14/2013   moderate atypia - left scapula   Colon cancer (HCC)     precancer-partial colectomy   Dilated aortic root (HCC)    Elevated HDL    First degree AV block    First degree AV block    Gout    Heart murmur    History of kidney stones    Hyperlipidemia    Hypertension    LBBB (left bundle branch block)    Nonobstructive atherosclerosis of coronary artery    Squamous cell skin cancer 08/04/2011   Left outer brow - CX3 + 5FU   Thoracic aortic aneurysm     Past Surgical History:  Procedure Laterality Date   BACK SURGERY     CATARACT EXTRACTION W/ INTRAOCULAR LENS  IMPLANT, BILATERAL Bilateral 2017   COLONOSCOPY W/ POLYPECTOMY     FACET JOINT INJECTION  2009   LITHOTRIPSY  2010   LUMBAR LAMINECTOMY  ~ [place]   precancerours   PARTIAL KNEE ARTHROPLASTY Left 11/16/2015   Procedure: LEFT KNEE UNICOMPARTMENTAL ;  Surgeon: [name], MD;  Location: [place];  Service: Orthopedics;  Laterality: Left;   REPLACEMENT UNICONDYLAR JOINT KNEE Left 11/16/2015   RIGHT/LEFT HEART CATH AND CORONARY ANGIOGRAPHY N/A 08/10/2017   Procedure: RIGHT/LEFT HEART CATH AND CORONARY ANGIOGRAPHY;  Surgeon: [name], MD;  Location: [facility];  Service: Cardiovascular;  Laterality: N/A;   TEE WITHOUT CARDIOVERSION N/A 09/25/2017   Procedure: TRANSESOPHAGEAL ECHOCARDIOGRAM (TEE);  Surgeon: [name], MD;  Location: [place];  Service: Open Heart Surgery;  Laterality: N/A;   TONSILLECTOMY     TRANSCATHETER AORTIC VALVE REPLACEMENT, TRANSFEMORAL  N/A 09/25/2017   Procedure: TRANSCATHETER AORTIC VALVE REPLACEMENT, TRANSFEMORAL;  Surgeon: [name], MD;  Location: [place];  Service: Open Heart Surgery;  Laterality: N/A;   TYMPANOPLASTY Left 1998   ruptured ear drum with skin graft    Current Outpatient Medications  Medication Sig Dispense Refill   allopurinol (ZYLOPRIM) 300 MG tablet TAKE 1 TABLET BY MOUTH EVERY DAY 90 tablet 3   amLODipine (NORVASC) 10 MG tablet TAKE 1 TABLET BY MOUTH EVERY DAY 90 tablet  3   amoxicillin (AMOXIL) 500 MG tablet Take 4 tablets (2,000 mg total) by mouth as directed. 1 hour prior to dental work including cleanings 8 tablet 2   cyanocobalamin 1000 MCG tablet Take 1,000 mcg by mouth daily.     diphenhydrAMINE (BENADRYL) 25 mg capsule Take 25 mg by mouth at bedtime as needed for sleep.     furosemide (LASIX) 20 MG tablet TAKE 1 TABLET BY MOUTH EVERY DAY 90 tablet 3   hydrochlorothiazide (MICROZIDE) 12.5 MG capsule TAKE 1 CAPSULE BY MOUTH EVERY DAY 90 capsule 3   potassium citrate (UROCIT-K) 10 MEQ (1080 MG) SR tablet Take 1 tablet (10 mEq total) by mouth 3 (three) times daily with meals. 300 tablet 1   quinapril (ACCUPRIL) 40 MG tablet Take 1 tablet (40 mg total) by mouth daily. 90 tablet 3   warfarin (COUMADIN) 5 MG tablet Take 1 to 1.5 tablets once daily as directed by anticoagulation clinic 45 tablet 3   No current facility-administered medications for this visit.    No Known Allergies  Social History   Socioeconomic History   Marital status: Married    Spouse name: Not on file   Number of children: 5   Years of education: Not on file   Highest education level: Not on file  Occupational History   Occupation: Government social research officer  Tobacco Use   Smoking status: Former    Packs/day: 1.00    Years: 20.00    Pack years: 20.00    Types: Cigarettes    Quit date: 09/15/1980    Years since quitting: 40.6   Smokeless tobacco: Never  Vaping Use   Vaping Use: Never used  Substance and Sexual Activity   Alcohol use: Yes    Alcohol/week: 7.0 standard drinks    Types: 1 Glasses of wine, 1 Shots of liquor, 5 Standard drinks or equivalent per week   Drug use: No   Sexual activity: Never  Other Topics Concern   Not on file  Social History Narrative   Married. 5 children. 4 grandkids. 1 greatgrandkid.       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       Hobbies: woodworking      Advised to consider advanced directives/hcpoa-thinks wife  may have 55 from years ago.   Social Determinants of Health   Financial Resource Strain: Not on file  Food Insecurity: No Food Insecurity   Worried About Charity fundraiser in the Last Year: Never true   Ran Out of Food in the Last Year: Never true  Transportation Needs: No Transportation Needs   Lack of Transportation (Medical): No   Lack of Transportation (Non-Medical): No  Physical Activity: Not on file  Stress: Not on file  Social Connections: Not on file  Intimate Partner Violence: Not on file    Family History  Problem Relation Age of Onset   Heart attack Mother 35       former smoker   Heart attack Father  62       former smoker   Hypertension Sister    Heart attack Brother 60       Rheumatic fever    Review of Systems:  As stated in the HPI and otherwise negative.   BP 130/70   Pulse 67   Ht 6' (1.829 m)   Wt 228 lb 12.8 oz (103.8 kg)   SpO2 98%   BMI 31.03 kg/m   Physical Examination:  General: Well developed, well nourished, NAD  HEENT: OP clear, mucus membranes moist  SKIN: warm, dry. No rashes. Neuro: No focal deficits  Musculoskeletal: Muscle strength 5/5 all ext  Psychiatric: Mood and affect normal  Neck: No JVD, no carotid bruits, no thyromegaly, no lymphadenopathy.  Lungs:Clear bilaterally, no wheezes, rhonci, crackles Cardiovascular: Regular rate and rhythm. No murmurs, gallops or rubs. Abdomen:Soft. Bowel sounds present. Non-tender.  Extremities: No lower extremity edema. Pulses are 2 + in the bilateral DP/PT.  Echo June 2022:   1. Left ventricular ejection fraction, by estimation, is 55 to 60%. The  left ventricle has normal function. The left ventricle has no regional  wall motion abnormalities. There is mild left ventricular hypertrophy.  Left ventricular diastolic parameters  are consistent with Grade I diastolic dysfunction (impaired relaxation).  Elevated left ventricular end-diastolic pressure. The E/e' is 33.   2. Right  ventricular systolic function is hyperdynamic. The right  ventricular size is normal. There is normal pulmonary artery systolic  pressure. The estimated right ventricular systolic pressure is 79.0 mmHg.   3. The mitral valve is abnormal. Trivial mitral valve regurgitation.   4. The aortic valve has been repaired/replaced. Aortic valve  regurgitation is not visualized. There is a 29 mm Edwards lifesciences  valve present in the aortic position. Procedure Date: 09/25/2017. Echo  findings are consistent with normal structure and  function of the aortic valve prosthesis. Aortic valve area, by VTI  measures 1.34 cm. Aortic valve mean gradient measures 19.0 mmHg. Aortic  valve Vmax measures 2.83 m/s. Peak gradient 32 mmHg. DI is 0.32.   5. Aortic dilatation noted. There is mild dilatation of the aortic root,  measuring 43 mm. There is moderate dilatation of the ascending aorta,  measuring 47 mm.   6. The inferior vena cava is normal in size with greater than 50%  respiratory variability, suggesting right atrial pressure of 3 mmHg.    EKG:  EKG is  ordered today. The ekg ordered today demonstrates sinus, LBBB  Recent Labs: 04/01/2021: ALT 13; BUN 25; Creatinine, Ser 1.20; Hemoglobin 13.1; Platelets 231.0; Potassium 4.1; Sodium 137   Lipid Panel    Component Value Date/Time   CHOL 197 04/01/2021 1000   TRIG 119.0 04/01/2021 1000   TRIG 87 06/22/2006 1013   HDL 61.50 04/01/2021 1000   CHOLHDL 3 04/01/2021 1000   VLDL 23.8 04/01/2021 1000   LDLCALC 112 (H) 04/01/2021 1000   LDLCALC 108 (H) 03/26/2020 1128   LDLDIRECT 136.2 10/25/2012 1009     Wt Readings from Last 3 Encounters:  05/04/21 228 lb 12.8 oz (103.8 kg)  03/23/21 230 lb 6.4 oz (104.5 kg)  10/18/20 224 lb 12.8 oz (102 kg)     Other studies Reviewed: Additional studies/ records that were reviewed today include: . Review of the above records demonstrates:   Assessment and Plan:   1. Severe AS: He is now s/p TAVR. He has  had evidence of HALT and HAM so is now on coumadin. Will continue coumadin. Repeat echo  in 6 months. No ASA since he is on coumain.  Continue SBE prophylaxis as needed.    2. HTN: BP is well controlled. No changes  3. Chronic diastolic CHF: Weight is stable. No volume overload on exam. Continue Lasix.   4. Thoracic aortic aneurysm: Stable ascending aortic aneurysm by chest MRA June 2022. Repeat June 2023.   5. LBBB: New post TAVR. No dizziness or syncope.  Current medicines are reviewed at length with the patient today.  The patient does not have concerns regarding medicines.  The following changes have been made:  no change  Labs/ tests ordered today include:   Orders Placed This Encounter  Procedures   EKG 12-Lead   ECHOCARDIOGRAM COMPLETE    Disposition:  Follow up six months.    Signed, Lauree Chandler, MD 05/04/2021 4:51 PM    [facility] [address], [place], [place]  [address] Phone: [phone]; Fax: [phone]

## 2021-05-27 ENCOUNTER — Ambulatory Visit (INDEPENDENT_AMBULATORY_CARE_PROVIDER_SITE_OTHER): Payer: PPO

## 2021-05-27 ENCOUNTER — Other Ambulatory Visit: Payer: Self-pay

## 2021-05-27 DIAGNOSIS — Z0000 Encounter for general adult medical examination without abnormal findings: Secondary | ICD-10-CM | POA: Diagnosis not present

## 2021-05-27 NOTE — Patient Instructions (Addendum)
Barry Decker , Thank you for taking time to come for your Medicare Wellness Visit. I appreciate your ongoing commitment to your health goals. Please review the following plan we discussed and let me know if I can assist you in the future.   Screening recommendations/referrals: Colonoscopy: No longer required  Recommended yearly ophthalmology/optometry visit for glaucoma screening and checkup Recommended yearly dental visit for hygiene and checkup  Vaccinations: Influenza vaccine: Done 03/26/21 repeat every year  Pneumococcal vaccine: Up to date Tdap vaccine: one 05/22/18 repeat every 10 years  Shingles vaccine: Completed 1/1, 12/26/18    Covid-19: Completed 1/20, 2/8, 03/17/20 & 11/18/20  Advanced directives: Copies in chart  Conditions/risks identified: Exercise more with walking   Next appointment: Follow up in one year for your annual wellness visit.   Preventive Care 21 Years and Older, Male Preventive care refers to lifestyle choices and visits with your health care provider that can promote health and wellness. What does preventive care include? A yearly physical exam. This is also called an annual well check. Dental exams once or twice a year. Routine eye exams. Ask your health care provider how often you should have your eyes checked. Personal lifestyle choices, including: Daily care of your teeth and gums. Regular physical activity. Eating a healthy diet. Avoiding tobacco and drug use. Limiting alcohol use. Practicing safe sex. Taking low doses of aspirin every day. Taking vitamin and mineral supplements as recommended by your health care provider. What happens during an annual well check? The services and screenings done by your health care provider during your annual well check will depend on your age, overall health, lifestyle risk factors, and family history of disease. Counseling  Your health care provider may ask you questions about your: Alcohol use. Tobacco  use. Drug use. Emotional well-being. Home and relationship well-being. Sexual activity. Eating habits. History of falls. Memory and ability to understand (cognition). Work and work Statistician. Screening  You may have the following tests or measurements: Height, weight, and BMI. Blood pressure. Lipid and cholesterol levels. These may be checked every 5 years, or more frequently if you are over 74 years old. Skin check. Lung cancer screening. You may have this screening every year starting at age 5 if you have a 30-pack-year history of smoking and currently smoke or have quit within the past 15 years. Fecal occult blood test (FOBT) of the stool. You may have this test every year starting at age 15. Flexible sigmoidoscopy or colonoscopy. You may have a sigmoidoscopy every 5 years or a colonoscopy every 10 years starting at age 10. Prostate cancer screening. Recommendations will vary depending on your family history and other risks. Hepatitis C blood test. Hepatitis B blood test. Sexually transmitted disease (STD) testing. Diabetes screening. This is done by checking your blood sugar (glucose) after you have not eaten for a while (fasting). You may have this done every 1-3 years. Abdominal aortic aneurysm (AAA) screening. You may need this if you are a current or former smoker. Osteoporosis. You may be screened starting at age 71 if you are at high risk. Talk with your health care provider about your test results, treatment options, and if necessary, the need for more tests. Vaccines  Your health care provider may recommend certain vaccines, such as: Influenza vaccine. This is recommended every year. Tetanus, diphtheria, and acellular pertussis (Tdap, Td) vaccine. You may need a Td booster every 10 years. Zoster vaccine. You may need this after age 48. Pneumococcal 13-valent conjugate (PCV13) vaccine.  One dose is recommended after age 40. Pneumococcal polysaccharide (PPSV23) vaccine.  One dose is recommended after age 1. Talk to your health care provider about which screenings and vaccines you need and how often you need them. This information is not intended to replace advice given to you by your health care provider. Make sure you discuss any questions you have with your health care provider. Document Released: 07/02/2015 Document Revised: 02/23/2016 Document Reviewed: 04/06/2015 Elsevier Interactive Patient Education  2017 [place] Prevention in the Home Falls can cause injuries. They can happen to people of all ages. There are many things you can do to make your home safe and to help prevent falls. What can I do on the outside of my home? Regularly fix the edges of walkways and driveways and fix any cracks. Remove anything that might make you trip as you walk through a door, such as a raised step or threshold. Trim any bushes or trees on the path to your home. Use bright outdoor lighting. Clear any walking paths of anything that might make someone trip, such as rocks or tools. Regularly check to see if handrails are loose or broken. Make sure that both sides of any steps have handrails. Any raised decks and porches should have guardrails on the edges. Have any leaves, snow, or ice cleared regularly. Use sand or salt on walking paths during winter. Clean up any spills in your garage right away. This includes oil or grease spills. What can I do in the bathroom? Use night lights. Install grab bars by the toilet and in the tub and shower. Do not use towel bars as grab bars. Use non-skid mats or decals in the tub or shower. If you need to sit down in the shower, use a plastic, non-slip stool. Keep the floor dry. Clean up any water that spills on the floor as soon as it happens. Remove soap buildup in the tub or shower regularly. Attach bath mats securely with double-sided non-slip rug tape. Do not have throw rugs and other things on the floor that can make  you trip. What can I do in the bedroom? Use night lights. Make sure that you have a light by your bed that is easy to reach. Do not use any sheets or blankets that are too big for your bed. They should not hang down onto the floor. Have a firm chair that has side arms. You can use this for support while you get dressed. Do not have throw rugs and other things on the floor that can make you trip. What can I do in the kitchen? Clean up any spills right away. Avoid walking on wet floors. Keep items that you use a lot in easy-to-reach places. If you need to reach something above you, use a strong step stool that has a grab bar. Keep electrical cords out of the way. Do not use floor polish or wax that makes floors slippery. If you must use wax, use non-skid floor wax. Do not have throw rugs and other things on the floor that can make you trip. What can I do with my stairs? Do not leave any items on the stairs. Make sure that there are handrails on both sides of the stairs and use them. Fix handrails that are broken or loose. Make sure that handrails are as long as the stairways. Check any carpeting to make sure that it is firmly attached to the stairs. Fix any carpet that is loose or  worn. Avoid having throw rugs at the top or bottom of the stairs. If you do have throw rugs, attach them to the floor with carpet tape. Make sure that you have a light switch at the top of the stairs and the bottom of the stairs. If you do not have them, ask someone to add them for you. What else can I do to help prevent falls? Wear shoes that: Do not have high heels. Have rubber bottoms. Are comfortable and fit you well. Are closed at the toe. Do not wear sandals. If you use a stepladder: Make sure that it is fully opened. Do not climb a closed stepladder. Make sure that both sides of the stepladder are locked into place. Ask someone to hold it for you, if possible. Clearly mark and make sure that you can  see: Any grab bars or handrails. First and last steps. Where the edge of each step is. Use tools that help you move around (mobility aids) if they are needed. These include: Canes. Walkers. Scooters. Crutches. Turn on the lights when you go into a dark area. Replace any light bulbs as soon as they burn out. Set up your furniture so you have a clear path. Avoid moving your furniture around. If any of your floors are uneven, fix them. If there are any pets around you, be aware of where they are. Review your medicines with your doctor. Some medicines can make you feel dizzy. This can increase your chance of falling. Ask your doctor what other things that you can do to help prevent falls. This information is not intended to replace advice given to you by your health care provider. Make sure you discuss any questions you have with your health care provider. Document Released: 04/01/2009 Document Revised: 11/11/2015 Document Reviewed: 07/10/2014 Elsevier Interactive Patient Education  2017 [employer].

## 2021-05-27 NOTE — Progress Notes (Signed)
Virtual Visit via Telephone Note  I connected with  Barry Decker on 05/27/21 at 10:15 AM EST by telephone and verified that I am speaking with the correct person using two identifiers.  Medicare Annual Wellness visit completed telephonically due to Covid-19 pandemic.   Persons participating in this call: This Health Coach and this patient.   Location: Patient: Home Provider: Office   I discussed the limitations, risks, security and privacy concerns of performing an evaluation and management service by telephone and the availability of in person appointments. The patient expressed understanding and agreed to proceed.  Unable to perform video visit due to video visit attempted and failed and/or patient does not have video capability.   Some vital signs may be absent or patient reported.   Willette Brace, LPN   Subjective:   Barry Decker is a 87 y.o. male who presents for an Initial Medicare Annual Wellness Visit.  Review of Systems      Cardiac Risk Factors include: advanced age (>50men, >39 women);male gender;hypertension;obesity (BMI >30kg/m2)     Objective:    There were no vitals filed for this visit. There is no height or weight on file to calculate BMI.  Advanced Directives 05/27/2021 10/15/2020 09/27/2017 09/21/2017 09/20/2017 08/10/2017 11/16/2015  Does Patient Have a Medical Advance Directive? Yes Yes Yes Yes Yes Yes Yes  Type of Paramedic of [place];Living will [place];Living will [place];Living will [place];Living will Living will [place]  Does patient want to make changes to medical advance directive? - No - Patient declined No - Patient declined - No - Patient declined No - Patient declined -  Copy of [place] in Chart? Yes - validated most recent copy scanned in chart (See row information) Yes - validated most  recent copy scanned in chart (See row information) Yes Yes No - copy requested - Yes    Current Medications (verified) Outpatient Encounter Medications as of 05/27/2021  Medication Sig   allopurinol (ZYLOPRIM) 300 MG tablet TAKE 1 TABLET BY MOUTH EVERY DAY   amLODipine (NORVASC) 10 MG tablet TAKE 1 TABLET BY MOUTH EVERY DAY   amoxicillin (AMOXIL) 500 MG tablet Take 4 tablets (2,000 mg total) by mouth as directed. 1 hour prior to dental work including cleanings   cyanocobalamin 1000 MCG tablet Take 1,000 mcg by mouth daily.   diphenhydrAMINE (BENADRYL) 25 mg capsule Take 25 mg by mouth at bedtime as needed for sleep.   furosemide (LASIX) 20 MG tablet TAKE 1 TABLET BY MOUTH EVERY DAY   hydrochlorothiazide (MICROZIDE) 12.5 MG capsule TAKE 1 CAPSULE BY MOUTH EVERY DAY   potassium citrate (UROCIT-K) 10 MEQ (1080 MG) SR tablet Take 1 tablet (10 mEq total) by mouth 3 (three) times daily with meals.   quinapril (ACCUPRIL) 40 MG tablet Take 1 tablet (40 mg total) by mouth daily.   warfarin (COUMADIN) 5 MG tablet Take 1 to 1.5 tablets once daily as directed by anticoagulation clinic   No facility-administered encounter medications on file as of 05/27/2021.    Allergies (verified) Patient has no known allergies.   History: Past Medical History:  Diagnosis Date   Aortic atherosclerosis ([place])    Aortic valve disease    a. severe aortic stenosis s/p TAVR 09/2017.   Arthritis    "spinal" (11/16/2015)   Atypical nevus 05/14/2013   moderate atypia - left scapula   Colon cancer (  [employer])    precancer-partial colectomy   Dilated aortic root ([facility])    Elevated HDL    First degree AV block    First degree AV block    Gout    Heart murmur    History of kidney stones    Hyperlipidemia    Hypertension    LBBB (left bundle branch block)    Nonobstructive atherosclerosis of coronary artery    Squamous cell skin cancer 08/04/2011   Left outer brow - CX3 + 5FU   Thoracic aortic aneurysm    Past Surgical  History:  Procedure Laterality Date   BACK SURGERY     CATARACT EXTRACTION W/ INTRAOCULAR LENS  IMPLANT, BILATERAL Bilateral 2017   COLONOSCOPY W/ POLYPECTOMY     FACET JOINT INJECTION  2009   LITHOTRIPSY  2010   LUMBAR LAMINECTOMY  ~ [place]   precancerours   PARTIAL KNEE ARTHROPLASTY Left 11/16/2015   Procedure: LEFT KNEE UNICOMPARTMENTAL ;  Surgeon: [name], MD;  Location: [facility];  Service: Orthopedics;  Laterality: Left;   REPLACEMENT UNICONDYLAR JOINT KNEE Left 11/16/2015   RIGHT/LEFT HEART CATH AND CORONARY ANGIOGRAPHY N/A 08/10/2017   Procedure: RIGHT/LEFT HEART CATH AND CORONARY ANGIOGRAPHY;  Surgeon: [name], MD;  Location: [facility];  Service: Cardiovascular;  Laterality: N/A;   TEE WITHOUT CARDIOVERSION N/A 09/25/2017   Procedure: TRANSESOPHAGEAL ECHOCARDIOGRAM (TEE);  Surgeon: [name], MD;  Location: [facility];  Service: Open Heart Surgery;  Laterality: N/A;   TONSILLECTOMY     TRANSCATHETER AORTIC VALVE REPLACEMENT, TRANSFEMORAL N/A 09/25/2017   Procedure: TRANSCATHETER AORTIC VALVE REPLACEMENT, TRANSFEMORAL;  Surgeon: [name], MD;  Location: [facility];  Service: Open Heart Surgery;  Laterality: N/A;   TYMPANOPLASTY Left 1998   ruptured ear drum with skin graft   Family History  Problem Relation Age of Onset   Heart attack Mother 33       former smoker   Heart attack Father 79       former smoker   Hypertension Sister    Heart attack Brother 34       Rheumatic fever   Social History   Socioeconomic History   Marital status: Married    Spouse name: Not on file   Number of children: 5   Years of education: Not on file   Highest education level: Not on file  Occupational History   Occupation: Government social research officer  Tobacco Use   Smoking status: Former    Packs/day: 1.00    Years: 20.00    Pack years: 20.00    Types: Cigarettes    Quit date: 09/15/1980    Years since quitting: 40.7    Smokeless tobacco: Never  Vaping Use   Vaping Use: Never used  Substance and Sexual Activity   Alcohol use: Yes    Alcohol/week: 7.0 standard drinks    Types: 1 Glasses of wine, 1 Shots of liquor, 5 Standard drinks or equivalent per week   Drug use: No   Sexual activity: Never  Other Topics Concern   Not on file  Social History Narrative   Married. 5 children. 4 grandkids. 1 greatgrandkid.       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       Hobbies: woodworking      Advised to consider advanced directives/hcpoa-thinks wife may have 34 from years ago.   Social Determinants of Health   Financial Resource Strain: Low Risk  Difficulty of Paying Living Expenses: Not hard at all  Food Insecurity: No Food Insecurity   Worried About [place] in the Last Year: Never true   Ran Out of Food in the Last Year: Never true  Transportation Needs: No Transportation Needs   Lack of Transportation (Medical): No   Lack of Transportation (Non-Medical): No  Physical Activity: Insufficiently Active   Days of Exercise per Week: 4 days   Minutes of Exercise per Session: 30 min  Stress: No Stress Concern Present   Feeling of Stress : Not at all  Social Connections: Socially Integrated   Frequency of Communication with Friends and Family: More than three times a week   Frequency of Social Gatherings with Friends and Family: More than three times a week   Attends Religious Services: More than 4 times per year   Active Member of [employer] or Organizations: Yes   Attends Archivist Meetings: 1 to 4 times per year   Marital Status: Married    Tobacco Counseling Counseling given: Not Answered   Clinical Intake:  Pre-visit preparation completed: Yes  Pain : No/denies pain     BMI - recorded: 31.03 Nutritional Status: BMI > 30  Obese Nutritional Risks: None Diabetes: No  How often do you need to have someone help you when you read instructions,  pamphlets, or other written materials from your doctor or pharmacy?: 1 - Never  Diabetic?No  Interpreter Needed?: No  Information entered by :: Charlott Rakes, LPN   Activities of Daily Living In your present state of health, do you have any difficulty performing the following activities: 05/27/2021 09/27/2020  Hearing? N N  Vision? N N  Difficulty concentrating or making decisions? N N  Walking or climbing stairs? N N  Dressing or bathing? N N  Doing errands, shopping? N N  Preparing Food and eating ? N -  Using the Toilet? N -  In the past six months, have you accidently leaked urine? N -  Do you have problems with loss of bowel control? N -  Managing your Medications? N -  Managing your Finances? N -  Housekeeping or managing your Housekeeping? N -  Some recent data might be hidden    Patient Care Team: [name], MD as PCP - General (Family Medicine) [name], MD as PCP - Cardiology (Cardiology) Madelin Rear, [facility] as Pharmacist (Pharmacist)  Indicate any recent Medical Services you may have received from other than [facility] providers in the past year (date may be approximate).     Assessment:   This is a routine wellness examination for Barry Decker.  Hearing/Vision screen Hearing Screening - Comments:: Pt denies any hearing issues  Vision Screening - Comments:: Pt follows up with Dr Tommy Rainwater Dr Nancy Fetter for annul eye exams   Dietary issues and exercise activities discussed: Current Exercise Habits: Home exercise routine, Type of exercise: walking, Time (Minutes): 30, Frequency (Times/Week): 4, Weekly Exercise (Minutes/Week): 120   Goals Addressed             This Visit's Progress    Patient Stated       Should exercise more with walking        Depression Screen PHQ 2/9 Scores 05/27/2021 04/01/2021 08/27/2020 03/26/2020 03/21/2019 05/21/2018 03/30/2017  PHQ - 2 Score 0 0 0 0 0 0 0    Fall Risk Fall Risk  05/27/2021 04/01/2021 08/27/2020 03/26/2020  01/13/2020  Falls in the past year? 0 0 1 0 0  Comment - - - - Emmi Telephone Survey: data to providers prior to load  Number falls in past yr: 0 0 0 0 -  Injury with Fall? 0 0 1 0 -  Risk for fall due to : Impaired vision No Fall Risks - - -  Follow up Falls prevention discussed Falls evaluation completed - - -    FALL RISK PREVENTION PERTAINING TO THE HOME:  Any stairs in or around the home? Yes  If so, are there any without handrails? No  Home free of loose throw rugs in walkways, pet beds, electrical cords, etc? Yes  Adequate lighting in your home to reduce risk of falls? Yes   ASSISTIVE DEVICES UTILIZED TO PREVENT FALLS:  Life alert? No  Use of a cane, walker or w/c? No  Grab bars in the bathroom? Yes  Shower chair or bench in shower? Yes  Elevated toilet seat or a handicapped toilet? No   TIMED UP AND GO:  Was the test performed? No .   Cognitive Function:     6CIT Screen 05/27/2021  What Year? 0 points  What month? 0 points  What time? 0 points  Count back from 20 0 points  Months in reverse 0 points  Repeat phrase 0 points  Total Score 0    Immunizations Immunization History  Administered Date(s) Administered   Fluad Quad(high Dose 65+) 03/05/2020, 04/05/2021   Influenza, High Dose Seasonal PF 03/26/2014, 04/08/2017, 03/20/2018, 02/03/2019, 03/04/2019, 04/05/2021   Influenza,inj,Quad PF,6+ Mos 03/14/2013   Influenza-Unspecified 04/06/2015, 03/02/2016, 02/28/2017, 03/20/2018   PFIZER Comirnaty(Gray Top)Covid-19 Tri-Sucrose Vaccine 11/18/2020   PFIZER(Purple Top)SARS-COV-2 Vaccination 07/09/2019, 07/28/2019, 03/17/2020   Pneumococcal Conjugate-13 05/25/2014   Pneumococcal Polysaccharide-23 06/19/2004   Td 12/27/2007   Tdap 05/22/2018   Zoster Recombinat (Shingrix) 06/19/2018, 12/26/2018   Zoster, Live 06/19/2008    TDAP status: Up to date  Flu Vaccine status: Up to date  Pneumococcal vaccine status: Up to date  Covid-19 vaccine status: Completed  vaccines  Qualifies for Shingles Vaccine? Yes   Zostavax completed Yes   Shingrix Completed?: Yes  Screening Tests Health Maintenance  Topic Date Due   COVID-19 Vaccine (5 - Booster for Pfizer series) 01/13/2021   TETANUS/TDAP  05/22/2028   Pneumonia Vaccine 18+ Years old  Completed   INFLUENZA VACCINE  Completed   Zoster Vaccines- Shingrix  Completed   HPV VACCINES  Aged Out    Health Maintenance  Health Maintenance Due  Topic Date Due   COVID-19 Vaccine (5 - Booster for [place] series) 01/13/2021    Colorectal cancer screening: No longer required.    Additional Screening:   Vision Screening: Recommended annual ophthalmology exams for early detection of glaucoma and other disorders of the eye. Is the patient up to date with their annual eye exam?  Yes  Who is the provider or what is the name of the office in which the patient attends annual eye exams? Dr Tommy Rainwater / Dr Nancy Fetter If pt is not established with a provider, would they like to be referred to a provider to establish care? No .   Dental Screening: Recommended annual dental exams for proper oral hygiene  Community Resource Referral / Chronic Care Management: CRR required this visit?  No   CCM required this visit?  No      Plan:     I have personally reviewed and noted the following in the patient's chart:   Medical and social history Use of alcohol, tobacco or illicit drugs  Current  medications and supplements including opioid prescriptions. Patient is not currently taking opioid prescriptions. Functional ability and status Nutritional status Physical activity Advanced directives List of other physicians Hospitalizations, surgeries, and ER visits in previous 12 months Vitals Screenings to include cognitive, depression, and falls Referrals and appointments  In addition, I have reviewed and discussed with patient certain preventive protocols, quality metrics, and best practice recommendations. A written  personalized care plan for preventive services as well as general preventive health recommendations were provided to patient.     Willette Brace, LPN   21/06/1733   Nurse Notes: None

## 2021-05-30 DIAGNOSIS — Z961 Presence of intraocular lens: Secondary | ICD-10-CM | POA: Diagnosis not present

## 2021-05-30 DIAGNOSIS — I10 Essential (primary) hypertension: Secondary | ICD-10-CM | POA: Diagnosis not present

## 2021-05-30 DIAGNOSIS — H26491 Other secondary cataract, right eye: Secondary | ICD-10-CM | POA: Diagnosis not present

## 2021-06-02 ENCOUNTER — Telehealth: Payer: Self-pay | Admitting: Pharmacist

## 2021-06-02 NOTE — Chronic Care Management (AMB) (Signed)
Chronic Care Management Pharmacy Assistant   Name: Barry Decker  MRN: [id number] DOB: [birth year]   Reason for Encounter: Hypertension Adherence Call    Recent office visits:  None  Recent consult visits:  04/08/2021 OV (Cardiology) [name], MD; no medication changes indicated.  Hospital visits:  None in previous 6 months  Medications: Outpatient Encounter Medications as of 06/02/2021  Medication Sig   allopurinol (ZYLOPRIM) 300 MG tablet TAKE 1 TABLET BY MOUTH EVERY DAY   amLODipine (NORVASC) 10 MG tablet TAKE 1 TABLET BY MOUTH EVERY DAY   amoxicillin (AMOXIL) 500 MG tablet Take 4 tablets (2,000 mg total) by mouth as directed. 1 hour prior to dental work including cleanings   cyanocobalamin 1000 MCG tablet Take 1,000 mcg by mouth daily.   diphenhydrAMINE (BENADRYL) 25 mg capsule Take 25 mg by mouth at bedtime as needed for sleep.   furosemide (LASIX) 20 MG tablet TAKE 1 TABLET BY MOUTH EVERY DAY   hydrochlorothiazide (MICROZIDE) 12.5 MG capsule TAKE 1 CAPSULE BY MOUTH EVERY DAY   potassium citrate (UROCIT-K) 10 MEQ (1080 MG) SR tablet Take 1 tablet (10 mEq total) by mouth 3 (three) times daily with meals.   quinapril (ACCUPRIL) 40 MG tablet Take 1 tablet (40 mg total) by mouth daily.   warfarin (COUMADIN) 5 MG tablet Take 1 to 1.5 tablets once daily as directed by anticoagulation clinic   No facility-administered encounter medications on file as of 06/02/2021.   Reviewed chart prior to disease state call. Spoke with patient regarding BP  Recent Office Vitals: BP Readings from Last 3 Encounters:  05/04/21 130/70  03/23/21 116/60  10/18/20 135/75   Pulse Readings from Last 3 Encounters:  05/04/21 67  03/23/21 61  10/18/20 67    Wt Readings from Last 3 Encounters:  05/04/21 228 lb 12.8 oz (103.8 kg)  03/23/21 230 lb 6.4 oz (104.5 kg)  10/18/20 224 lb 12.8 oz (102 kg)     Kidney Function Lab Results  Component Value Date/Time   CREATININE 1.20  04/01/2021 10:00 AM   CREATININE 1.02 11/11/2020 10:44 AM   CREATININE 0.99 03/26/2020 11:28 AM   CREATININE 0.92 04/20/2016 12:08 PM   GFR 54.88 (L) 04/01/2021 10:00 AM   GFRNONAA 61 05/11/2020 09:45 AM   GFRNONAA 69 03/26/2020 11:28 AM   GFRAA 70 05/11/2020 09:45 AM   GFRAA 80 03/26/2020 11:28 AM    BMP Latest Ref Rng & Units 04/01/2021 11/11/2020 09/27/2020  Glucose 70 - 99 mg/dL 85 97 87  BUN 6 - 23 mg/dL 25(H) 21 15  Creatinine 0.40 - 1.50 mg/dL 1.20 1.02 1.05  BUN/Creat Ratio 10 - 24 - 21 -  Sodium 135 - 145 mEq/L 137 139 140  Potassium 3.5 - 5.1 mEq/L 4.1 4.7 4.7  Chloride 96 - 112 mEq/L 101 103 105  CO2 19 - 32 mEq/L 30 28 30   Calcium 8.4 - 10.5 mg/dL 9.4 9.8 9.3    Current antihypertensive regimen:  Quinapril 40 mg daily Furosemide 20 mg daily Amlodipine 10 mg daily  How often are you checking your Blood Pressure? daily  Current home BP readings: 120's/65  What recent interventions/DTPs have been made by any provider to improve Blood Pressure control since last CPP Visit: No recent interventions or DTPs.  Any recent hospitalizations or ED visits since last visit with CPP? No  What diet changes have been made to improve Blood Pressure Control?  Patient states he hasn't made any recent changes.  What exercise is being done to improve your Blood Pressure Control?  Patient states he tries to exercise some but "not as much as I should."  Adherence Review: Is the patient currently on ACE/ARB medication? Yes Does the patient have >5 day gap between last estimated fill dates? No   Care Gaps: Medicare Annual Wellness: Completed 05/27/2021 Hemoglobin A1C: 5.2% on 09/21/2017 Colonoscopy: Aged out, last completed 10/22/2009  Future Appointments  Date Time Provider [place]  06/08/2021 11:00 AM [facility]  06/21/2021 11:30 AM [facility] PHLEBOTOMIST [facility] None  06/21/2021 12:00 PM Ladell Pier, MD [facility] None   07/25/2021  3:00 PM [facility] PHARMACIST [facility]  10/05/2021 10:00 AM [name], MD [facility]  04/04/2022  9:40 AM [name], MD [facility]  06/09/2022 11:00 AM [facility] HEALTH COACH [facility]    Star Rating Drugs: Quinapril 40 mg last filled 03/25/2021 90 DS  April D Calhoun, [place] Pharmacist Assistant [phone]

## 2021-06-08 ENCOUNTER — Other Ambulatory Visit: Payer: Self-pay

## 2021-06-08 ENCOUNTER — Ambulatory Visit (INDEPENDENT_AMBULATORY_CARE_PROVIDER_SITE_OTHER): Payer: PPO | Admitting: *Deleted

## 2021-06-08 DIAGNOSIS — Z79899 Other long term (current) drug therapy: Secondary | ICD-10-CM

## 2021-06-08 DIAGNOSIS — Z5181 Encounter for therapeutic drug level monitoring: Secondary | ICD-10-CM

## 2021-06-08 DIAGNOSIS — I8290 Acute embolism and thrombosis of unspecified vein: Secondary | ICD-10-CM | POA: Diagnosis not present

## 2021-06-08 LAB — POCT INR: INR: 2.4 (ref 2.0–3.0)

## 2021-06-08 NOTE — Patient Instructions (Signed)
Description   Continue taking Warfarin 1.5 tablets daily except 1 tablet on Fridays. Recheck in 6 weeks. Coumadin Clinic [phone], Main number [phone].        

## 2021-06-21 ENCOUNTER — Inpatient Hospital Stay: Payer: PPO

## 2021-06-21 ENCOUNTER — Inpatient Hospital Stay: Payer: PPO | Attending: Oncology | Admitting: Oncology

## 2021-06-21 ENCOUNTER — Other Ambulatory Visit: Payer: Self-pay

## 2021-06-21 VITALS — BP 125/67 | HR 65 | Temp 97.8°F | Resp 18 | Ht 72.0 in | Wt 234.0 lb

## 2021-06-21 DIAGNOSIS — E785 Hyperlipidemia, unspecified: Secondary | ICD-10-CM | POA: Diagnosis not present

## 2021-06-21 DIAGNOSIS — D7589 Other specified diseases of blood and blood-forming organs: Secondary | ICD-10-CM | POA: Insufficient documentation

## 2021-06-21 DIAGNOSIS — E538 Deficiency of other specified B group vitamins: Secondary | ICD-10-CM | POA: Diagnosis not present

## 2021-06-21 DIAGNOSIS — Z952 Presence of prosthetic heart valve: Secondary | ICD-10-CM | POA: Diagnosis not present

## 2021-06-21 DIAGNOSIS — D709 Neutropenia, unspecified: Secondary | ICD-10-CM | POA: Diagnosis not present

## 2021-06-21 DIAGNOSIS — I10 Essential (primary) hypertension: Secondary | ICD-10-CM | POA: Diagnosis not present

## 2021-06-21 DIAGNOSIS — I2510 Atherosclerotic heart disease of native coronary artery without angina pectoris: Secondary | ICD-10-CM | POA: Insufficient documentation

## 2021-06-21 DIAGNOSIS — I712 Thoracic aortic aneurysm, without rupture, unspecified: Secondary | ICD-10-CM | POA: Insufficient documentation

## 2021-06-21 LAB — CBC WITH DIFFERENTIAL (CANCER CENTER ONLY)
Abs Immature Granulocytes: 0.01 10*3/uL (ref 0.00–0.07)
Basophils Absolute: 0 10*3/uL (ref 0.0–0.1)
Basophils Relative: 1 %
Eosinophils Absolute: 0.2 10*3/uL (ref 0.0–0.5)
Eosinophils Relative: 4 %
HCT: 40.5 % (ref 39.0–52.0)
Hemoglobin: 13.4 g/dL (ref 13.0–17.0)
Immature Granulocytes: 0 %
Lymphocytes Relative: 39 %
Lymphs Abs: 1.8 10*3/uL (ref 0.7–4.0)
MCH: 35.2 pg — ABNORMAL HIGH (ref 26.0–34.0)
MCHC: 33.1 g/dL (ref 30.0–36.0)
MCV: 106.3 fL — ABNORMAL HIGH (ref 80.0–100.0)
Monocytes Absolute: 0.5 10*3/uL (ref 0.1–1.0)
Monocytes Relative: 11 %
Neutro Abs: 2 10*3/uL (ref 1.7–7.7)
Neutrophils Relative %: 45 %
Platelet Count: 195 10*3/uL (ref 150–400)
RBC: 3.81 MIL/uL — ABNORMAL LOW (ref 4.22–5.81)
RDW: 14.6 % (ref 11.5–15.5)
WBC Count: 4.4 10*3/uL (ref 4.0–10.5)
nRBC: 0 % (ref 0.0–0.2)

## 2021-06-21 NOTE — Progress Notes (Signed)
°  [facility] OFFICE PROGRESS NOTE   Diagnosis: Red cell macrocytosis  INTERVAL HISTORY:   Barry Decker returns as scheduled.  He feels well.  No complaint.  No bleeding.  No heavy alcohol use.  Objective:  Vital signs in last 24 hours:  Blood pressure 125/67, pulse 65, temperature 97.8 F (36.6 C), temperature source Oral, resp. rate 18, height 6' (1.829 m), weight 234 lb (106.1 kg), SpO2 98 %.    Lymphatics: No cervical, supraclavicular, axillary, or inguinal nodes Resp: Lungs clear bilaterally Cardio: Regular rate and rhythm GI: No hepatosplenomegaly Vascular: No leg edema   Lab Results  Component Value Date   WBC 4.4 06/21/2021   HGB 13.4 06/21/2021   HCT 40.5 06/21/2021   MCV 106.3 (H) 06/21/2021   PLT 195 06/21/2021   NEUTROABS 2.0 06/21/2021    CMP  Lab Results  Component Value Date   NA 137 04/01/2021   K 4.1 04/01/2021   CL 101 04/01/2021   CO2 30 04/01/2021   GLUCOSE 85 04/01/2021   BUN 25 (H) 04/01/2021   CREATININE 1.20 04/01/2021   CALCIUM 9.4 04/01/2021   PROT 6.7 04/01/2021   ALBUMIN 3.8 04/01/2021   AST 17 04/01/2021   ALT 13 04/01/2021   ALKPHOS 61 04/01/2021   BILITOT 0.5 04/01/2021   GFRNONAA 61 05/11/2020   GFRAA 70 05/11/2020     Lab Results  Component Value Date   INR 2.4 06/08/2021   LABPROT 20.9 (H) 07/09/2020     Medications: I have reviewed the patient's current medications.   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  He is not anemic.  He does not have evidence of chronic liver disease.  The vitamin B12 level is normal in October.  It is possible the Red cell macrocytosis is related to allopurinol, but he reports being on allopurinol  when he had a normal MCV in 2008.  He may have early myelodysplasia.  The plan is to continue observation.  He will return for an office visit and CBC in 9 months.  Betsy Coder, MD  06/21/2021  12:14 PM

## 2021-07-02 ENCOUNTER — Other Ambulatory Visit: Payer: Self-pay | Admitting: Family Medicine

## 2021-07-20 ENCOUNTER — Other Ambulatory Visit: Payer: Self-pay

## 2021-07-20 ENCOUNTER — Ambulatory Visit (INDEPENDENT_AMBULATORY_CARE_PROVIDER_SITE_OTHER): Payer: PPO

## 2021-07-20 DIAGNOSIS — I8290 Acute embolism and thrombosis of unspecified vein: Secondary | ICD-10-CM | POA: Diagnosis not present

## 2021-07-20 DIAGNOSIS — Z5181 Encounter for therapeutic drug level monitoring: Secondary | ICD-10-CM

## 2021-07-20 LAB — POCT INR: INR: 1.8 — AB (ref 2.0–3.0)

## 2021-07-20 NOTE — Patient Instructions (Signed)
Description   Take an extra 0.5 tablet today and then continue taking Warfarin 1.5 tablets daily except 1 tablet on Fridays. Recheck in 4 weeks. Coumadin Clinic [phone], Main number [phone].

## 2021-07-20 NOTE — Progress Notes (Signed)
Chronic Care Management Pharmacy Note  07/25/2021 Name:  Barry Decker MRN:  [id number] DOB:  04-30-1933  Summary: Discussed patient lab results from back in October.  He was unaware of recommendation to start Crestor once weekly.  Has upcoming fu visit with you in April.  He is willing to consider statin if you want to go ahead and start him on that once weekly. He is concerned of adverse effects but we discussed benefits and hopefully once weekly will greatly decrease changes.   Subjective: Barry Decker is an 88 y.o. year old male who is a primary patient of Hunter, Brayton Mars, MD.  The CCM team was consulted for assistance with disease management and care coordination needs.    Engaged with patient by telephone for follow up visit in response to provider referral for pharmacy case management and/or care coordination services.   Consent to Services:  The patient was given the following information about Chronic Care Management services today, agreed to services, and gave verbal consent: 1. CCM service includes personalized support from designated clinical staff supervised by the primary care provider, including individualized plan of care and coordination with other care providers 2. 24/7 contact phone numbers for assistance for urgent and routine care needs. 3. Service will only be billed when office clinical staff spend 20 minutes or more in a month to coordinate care. 4. Only one practitioner may furnish and bill the service in a calendar month. 5.The patient may stop CCM services at any time (effective at the end of the month) by phone call to the office staff. 6. The patient will be responsible for cost sharing (co-pay) of up to 20% of the service fee (after annual deductible is met). Patient agreed to services and consent obtained.  Patient Care Team: [name], MD as PCP - General (Family Medicine) [name], MD as PCP - Cardiology (Cardiology) Edythe Clarity, [facility] (Pharmacist)   Objective:  Lab Results  Component Value Date   CREATININE 1.20 04/01/2021   BUN 25 (H) 04/01/2021   GFR 54.88 (L) 04/01/2021   GFRNONAA 61 05/11/2020   GFRAA 70 05/11/2020   NA 137 04/01/2021   K 4.1 04/01/2021   CALCIUM 9.4 04/01/2021   CO2 30 04/01/2021   GLUCOSE 85 04/01/2021    Lab Results  Component Value Date/Time   HGBA1C 5.2 09/21/2017 09:17 AM   GFR 54.88 (L) 04/01/2021 10:00 AM   GFR 64.64 09/27/2020 11:34 AM    Last diabetic Eye exam: No results found for: HMDIABEYEEXA  Last diabetic Foot exam: No results found for: HMDIABFOOTEX   Lab Results  Component Value Date   CHOL 197 04/01/2021   HDL 61.50 04/01/2021   LDLCALC 112 (H) 04/01/2021   LDLDIRECT 136.2 10/25/2012   TRIG 119.0 04/01/2021   CHOLHDL 3 04/01/2021    Hepatic Function Latest Ref Rng & Units 04/01/2021 09/27/2020 03/26/2020  Total Protein 6.0 - 8.3 g/dL 6.7 6.4 6.8  Albumin 3.5 - 5.2 g/dL 3.8 3.7 -  AST 0 - 37 U/L _0 ALT 0 - 53 U/L _1 Alk Phosphatase 39 - 117 U/L 61 100 -  Total Bilirubin 0.2 - 1.2 mg/dL 0.5 0.5 0.7  Bilirubin, Direct 0.0 - 0.3 mg/dL - - -    Lab Results  Component Value Date/Time   TSH 0.82 11/11/2013 10:14 AM   TSH 0.76 10/25/2012 10:09 AM    CBC Latest Ref Rng & Units 06/21/2021  04/01/2021 10/18/2020  WBC 4.0 - 10.5 K/uL 4.4 3.4(L) 5.5  Hemoglobin 13.0 - 17.0 g/dL 13.4 13.1 14.3  Hematocrit 39.0 - 52.0 % 40.5 39.0 42.1  Platelets 150 - 400 K/uL 195 231.0 191    No results found for: VD25OH  Clinical ASCVD: No  The ASCVD Risk score (Arnett DK, et al., 2019) failed to calculate for the following reasons:   The 2019 ASCVD risk score is only valid for ages 30 to 66    Depression screen PHQ 2/9 05/27/2021 04/01/2021 08/27/2020  Decreased Interest 0 0 0  Down, Depressed, Hopeless 0 0 0  PHQ - 2 Score 0 0 0     Social History   Tobacco Use  Smoking Status Former   Packs/day: 1.00   Years: 20.00   Pack years: 20.00   Types:  Cigarettes   Quit date: 09/15/1980   Years since quitting: 40.8  Smokeless Tobacco Never   BP Readings from Last 3 Encounters:  06/21/21 125/67  05/04/21 130/70  03/23/21 116/60   Pulse Readings from Last 3 Encounters:  06/21/21 65  05/04/21 67  03/23/21 61   Wt Readings from Last 3 Encounters:  06/21/21 234 lb (106.1 kg)  05/04/21 228 lb 12.8 oz (103.8 kg)  03/23/21 230 lb 6.4 oz (104.5 kg)   BMI Readings from Last 3 Encounters:  06/21/21 31.74 kg/m  05/04/21 31.03 kg/m  03/23/21 31.25 kg/m    Assessment/Interventions: Review of patient past medical history, allergies, medications, health status, including review of consultants reports, laboratory and other test data, was performed as part of comprehensive evaluation and provision of chronic care management services.   SDOH:  (Social Determinants of Health) assessments and interventions performed: Yes  Financial Resource Strain: Low Risk    Difficulty of Paying Living Expenses: Not hard at all    SDOH Screenings   Alcohol Screen: Not on file  Depression (PHQ2-9): Low Risk    PHQ-2 Score: 0  Financial Resource Strain: Low Risk    Difficulty of Paying Living Expenses: Not hard at all  Food Insecurity: No Food Insecurity   Worried About Charity fundraiser in the Last Year: Never true   Arboriculturist in the Last Year: Never true  Housing: Low Risk    Last Housing Risk Score: 0  Physical Activity: Insufficiently Active   Days of Exercise per Week: 4 days   Minutes of Exercise per Session: 30 min  Social Connections: Engineer, building services of Communication with Friends and Family: More than three times a week   Frequency of Social Gatherings with Friends and Family: More than three times a week   Attends Religious Services: More than 4 times per year   Active Member of [employer] or Organizations: Yes   Attends Archivist Meetings: 1 to 4 times per year   Marital Status: Married  Stress: No Stress  Concern Present   Feeling of Stress : Not at all  Tobacco Use: Medium Risk   Smoking Tobacco Use: Former   Smokeless Tobacco Use: Never   Passive Exposure: Not on Pensions consultant Needs: No Transportation Needs   Lack of Transportation (Medical): No   Lack of Transportation (Non-Medical): No    CCM Care Plan  No Known Allergies  Medications Reviewed Today     Reviewed by Edythe Clarity, [facility] (Pharmacist) on 07/25/21 at [place] List Status: <None>   Medication Order Taking? Sig Documenting Provider Last Dose Status  Informant  allopurinol (ZYLOPRIM) 300 MG tablet [id number] Yes TAKE 1 TABLET BY MOUTH EVERY DAY [name], MD Taking Active   amLODipine (NORVASC) 10 MG tablet [id number] Yes TAKE 1 TABLET BY MOUTH EVERY DAY [name], MD Taking Active   amoxicillin (AMOXIL) 500 MG tablet [id number] Yes Take 4 tablets (2,000 mg total) by mouth as directed. 1 hour prior to dental work including cleanings Charlie Pitter, PA-C Taking Active   cyanocobalamin 1000 MCG tablet [id number] Yes Take 1,000 mcg by mouth daily. [provider] Taking Active   diphenhydrAMINE (BENADRYL) 25 mg capsule [id number] Yes Take 25 mg by mouth at bedtime as needed for sleep. [provider] Taking Active   furosemide (LASIX) 20 MG tablet [id number] Yes TAKE 1 TABLET BY MOUTH EVERY DAY [name], MD Taking Active   hydrochlorothiazide (MICROZIDE) 12.5 MG capsule [id number] Yes TAKE 1 CAPSULE BY MOUTH EVERY DAY [name], MD Taking Active   potassium citrate (UROCIT-K) 10 MEQ (1080 MG) SR tablet [id number] Yes Take 1 tablet (10 mEq total) by mouth 3 (three) times daily with meals. Lisabeth Pick, MD Taking Active Self  quinapril (ACCUPRIL) 40 MG tablet [id number] Yes TAKE 1 TABLET BY MOUTH EVERY DAY [name], MD Taking Active   warfarin (COUMADIN) 5 MG tablet [id number] Yes Take 1 to 1.5 tablets once daily as directed by anticoagulation clinic  [name], MD Taking Active             Patient Active Problem List   Diagnosis Date Noted   Medication management 12/02/2020   Thrombus 05/31/2020   Encounter for therapeutic drug monitoring 05/31/2020   Ascending aortic aneurysm 03/26/2020   Aortic atherosclerosis ([place]) 03/26/2020   B12 deficiency 09/23/2019   Macrocytic anemia 03/21/2019   Status post transcatheter aortic valve replacement (TAVR) using bioprosthesis 09/25/2017   Kidney stones    Chronic diastolic CHF (congestive heart failure) ([place]) 03/30/2017   Abdominal aortic ectasia ([place]) 04/07/2016   Osteoarthritis of cervical spine 08/31/2015   Throat pain in adult 08/31/2015   Osteoarthritis of left knee 05/25/2014   HYPERCHOLESTEROLEMIA WITH HIGH HDL 01/31/2007   Gout 01/31/2007   Essential hypertension 01/31/2007   History of malignant neoplasm of large intestine 01/31/2007    Immunization History  Administered Date(s) Administered   Fluad Quad(high Dose 65+) 03/05/2020, 04/05/2021   Influenza, High Dose Seasonal PF 03/26/2014, 04/08/2017, 03/20/2018, 02/03/2019, 03/04/2019, 04/05/2021   Influenza,inj,Quad PF,6+ Mos 03/14/2013   Influenza-Unspecified 04/06/2015, 03/02/2016, 02/28/2017, 03/20/2018   PFIZER Comirnaty(Gray Top)Covid-19 Tri-Sucrose Vaccine 11/18/2020   PFIZER(Purple Top)SARS-COV-2 Vaccination 07/09/2019, 07/28/2019, 03/17/2020   Pfizer Covid-19 Vaccine Bivalent Booster 50yrs & up 07/05/2021   Pneumococcal Conjugate-13 05/25/2014   Pneumococcal Polysaccharide-23 06/19/2004   Td 12/27/2007   Tdap 05/22/2018   Zoster Recombinat (Shingrix) 06/19/2018, 12/26/2018   Zoster, Live 06/19/2008    Conditions to be addressed/monitored:  HTN, HLD, Aortic Valve Replacement, HF  Care Plan : General Pharmacy (Adult)  Updates made by Edythe Clarity, RPH since 07/25/2021 12:00 AM     Problem: HTN, HLD, Aortic Valve Replacement, HF   Priority: High  Onset Date: 01/17/2021     Long-Range  Goal: Patient-Specific Goal   Start Date: 01/17/2021  Expected End Date: 07/20/2021  Recent Progress: On track  Priority: High  Note:   Current Barriers:  Suboptimal therapeutic regimen for HLD  Pharmacist Clinical Goal(s):  Patient will achieve control of cholesterol as evidenced by lipid panel contact provider office for  questions/concerns as evidenced notation of same in electronic health record through collaboration with PharmD and provider.   Interventions: 1:1 collaboration with [name], MD regarding development and update of comprehensive plan of care as evidenced by provider attestation and co-signature Inter-disciplinary care team collaboration (see longitudinal plan of care) Comprehensive medication review performed; medication list updated in electronic medical record  Hypertension  (Status:Goal on track: YES.)   Med Management Intervention: None  (BP goal <130/80) -Controlled -Current treatment: Amlodipine 10mg  daily HCTZ 12.5mg  daily Quinapril 40mg  daily -Medications previously tried: none noted  -Current home readings: 122/67, 124/68, 128/68, 129/73, 124/67, 126/75 -Current exercise habits: "not as much as he would like" -Denies hypotensive/hypertensive symptoms -Educated on BP goals and benefits of medications for prevention of heart attack, stroke and kidney damage; Exercise goal of 150 minutes per week; Importance of home blood pressure monitoring; Symptoms of hypotension and importance of maintaining adequate hydration; -Counseled to monitor BP at home daily, document, and provide log at future appointments -Recommended to continue current medication  Update 07/25/21 120s/60s BP at home NO symptoms of HTN Denies dizziness or HA No changes needed at this time, continue home monitoring.  Hyperlipidemia: (LDL goal < 70) -Uncontrolled -Current treatment: NONE -Medications previously tried: none noted  -Has previously been offered a statin, but  declined and opted to work on lifestyle changes -Educated on Cholesterol goals;  Benefits of statin for ASCVD risk reduction; Importance of limiting foods high in cholesterol; Strategies to manage statin-induced myalgias; -Recommended to continue current management - recheck lipids, reconsider statin if still elevated  Update 07/25/21 Most recent LDL is 112, was suggested at that time by PCP to start Crestor 20mg  once weekly. Patient did not end up starting this medication.  I believe a VM was left on his phone and we never got in touch with him regarding his results. Patient with multiple risk factors would benefit from starting statin. Has follow up visit with PCP in April, is open to starting once weekly if PCP agrees this is best thing to do. Will consult with PCP to see if we want to start Crestor 20mg  once weekly now or wait until discussion at April OV. Continue to work on limiting fried/fatty foods.  Aortic Valve Replacement (Goal: -) -Controlled -Current treatment  Warfarin 5mg  - as directed by clinic -Medications previously tried: none noted -At goal most recently  -He denies any abnormal bleeding, bruising, etc  -Recommended to continue current medication  Patient Goals/Self-Care Activities Patient will:  - take medications as prescribed check blood pressure daily, document, and provide at future appointments target a minimum of 150 minutes of moderate intensity exercise weekly  Follow Up Plan: The care management team will reach out to the patient again over the next 180 days.             Medication Assistance: None required.  Patient affirms current coverage meets needs.  Compliance/Adherence/Medication fill history: Care Gaps: None at this time  Star-Rating Drugs:  Patient's preferred pharmacy is:  [facility] ([facility]) [place], [place] - [address] [place] [address] Phone: [phone] Fax:  [phone]  [id number] - [place], [address] AT [address] [place] [address] Phone: [phone] Fax: [phone]    Pt endorses 100% compliance  We discussed: Benefits of medication synchronization, packaging and delivery as well as enhanced pharmacist oversight with [employer]. Patient decided to: Continue current medication management  strategy  Care Plan and Follow Up Patient Decision:  Patient agrees to Care Plan and Follow-up.  Plan: The care management team will reach out to the patient again over the next 180 days.  Beverly Milch, PharmD Clinical Pharmacist  [facility] [phone]

## 2021-07-25 ENCOUNTER — Ambulatory Visit (INDEPENDENT_AMBULATORY_CARE_PROVIDER_SITE_OTHER): Payer: PPO | Admitting: Pharmacist

## 2021-07-25 DIAGNOSIS — E7800 Pure hypercholesterolemia, unspecified: Secondary | ICD-10-CM

## 2021-07-25 DIAGNOSIS — I10 Essential (primary) hypertension: Secondary | ICD-10-CM

## 2021-07-25 NOTE — Patient Instructions (Addendum)
Visit Information   Goals Addressed             This Visit's Progress    Track and Manage My Blood Pressure-Hypertension   On track    Timeframe:  Long-Range Goal Priority:  High Start Date: 01/17/21                            Expected End Date:  07/20/21                   Follow Up Date 05/18/21    - check blood pressure daily - choose a place to take my blood pressure (home, clinic or office, retail store) - write blood pressure results in a log or diary    Why is this important?   You won't feel high blood pressure, but it can still hurt your blood vessels.  High blood pressure can cause heart or kidney problems. It can also cause a stroke.  Making lifestyle changes like losing a little weight or eating less salt will help.  Checking your blood pressure at home and at different times of the day can help to control blood pressure.  If the doctor prescribes medicine remember to take it the way the doctor ordered.  Call the office if you cannot afford the medicine or if there are questions about it.     Notes:        Patient Care Plan: General Pharmacy (Adult)     Problem Identified: HTN, HLD, Aortic Valve Replacement, HF   Priority: High  Onset Date: 01/17/2021     Long-Range Goal: Patient-Specific Goal   Start Date: 01/17/2021  Expected End Date: 07/20/2021  Recent Progress: On track  Priority: High  Note:   Current Barriers:  Suboptimal therapeutic regimen for HLD  Pharmacist Clinical Goal(s):  Patient will achieve control of cholesterol as evidenced by lipid panel contact provider office for questions/concerns as evidenced notation of same in electronic health record through collaboration with PharmD and provider.   Interventions: 1:1 collaboration with [name], MD regarding development and update of comprehensive plan of care as evidenced by provider attestation and co-signature Inter-disciplinary care team collaboration (see longitudinal plan of  care) Comprehensive medication review performed; medication list updated in electronic medical record  Hypertension  (Status:Goal on track: YES.)   Med Management Intervention: None  (BP goal <130/80) -Controlled -Current treatment: Amlodipine 10mg  daily HCTZ 12.5mg  daily Quinapril 40mg  daily -Medications previously tried: none noted  -Current home readings: 122/67, 124/68, 128/68, 129/73, 124/67, 126/75 -Current exercise habits: "not as much as he would like" -Denies hypotensive/hypertensive symptoms -Educated on BP goals and benefits of medications for prevention of heart attack, stroke and kidney damage; Exercise goal of 150 minutes per week; Importance of home blood pressure monitoring; Symptoms of hypotension and importance of maintaining adequate hydration; -Counseled to monitor BP at home daily, document, and provide log at future appointments -Recommended to continue current medication  Update 07/25/21 120s/60s BP at home NO symptoms of HTN Denies dizziness or HA No changes needed at this time, continue home monitoring.  Hyperlipidemia: (LDL goal < 70) -Uncontrolled -Current treatment: NONE -Medications previously tried: none noted  -Has previously been offered a statin, but declined and opted to work on lifestyle changes -Educated on Cholesterol goals;  Benefits of statin for ASCVD risk reduction; Importance of limiting foods high in cholesterol; Strategies to manage statin-induced myalgias; -Recommended to continue current management - recheck  lipids, reconsider statin if still elevated  Update 07/25/21 Most recent LDL is 112, was suggested at that time by PCP to start Crestor 20mg  once weekly. Patient did not end up starting this medication.  I believe a VM was left on his phone and we never got in touch with him regarding his results. Patient with multiple risk factors would benefit from starting statin. Has follow up visit with PCP in April, is open to  starting once weekly if PCP agrees this is best thing to do. Will consult with PCP to see if we want to start Crestor 20mg  once weekly now or wait until discussion at April OV. Continue to work on limiting fried/fatty foods.  Aortic Valve Replacement (Goal: -) -Controlled -Current treatment  Warfarin 5mg  - as directed by clinic -Medications previously tried: none noted -At goal most recently  -He denies any abnormal bleeding, bruising, etc  -Recommended to continue current medication  Patient Goals/Self-Care Activities Patient will:  - take medications as prescribed check blood pressure daily, document, and provide at future appointments target a minimum of 150 minutes of moderate intensity exercise weekly  Follow Up Plan: The care management team will reach out to the patient again over the next 180 days.            Patient verbalizes understanding of instructions and care plan provided today and agrees to view in [place]. Active MyChart status confirmed with patient.   Telephone follow up appointment with pharmacy team member scheduled for: 6 months  Edythe Clarity, [place], PharmD Clinical Pharmacist  [facility] [phone]

## 2021-07-26 ENCOUNTER — Other Ambulatory Visit: Payer: Self-pay

## 2021-07-26 ENCOUNTER — Telehealth: Payer: Self-pay | Admitting: Family Medicine

## 2021-07-26 MED ORDER — ROSUVASTATIN CALCIUM 20 MG PO TABS: 20.0000 mg | ORAL_TABLET | ORAL | 3 refills | Status: DC

## 2021-07-26 NOTE — Telephone Encounter (Signed)
Pt stated he received a message to call back and speak to [place].

## 2021-07-26 NOTE — Telephone Encounter (Signed)
Called and spoke with pt, rosuvastatin sent in.

## 2021-07-30 ENCOUNTER — Other Ambulatory Visit: Payer: Self-pay | Admitting: Cardiovascular Disease

## 2021-08-01 NOTE — Telephone Encounter (Signed)
Prescription refill request received for warfarin Lov: [place], 05/04/2021 Next INR check: 3/1 Warfarin tablet strength: 5mg 

## 2021-08-10 ENCOUNTER — Encounter: Payer: Self-pay | Admitting: Family Medicine

## 2021-08-10 NOTE — Telephone Encounter (Signed)
Apt with Dr Yong Channel 08/12/21 at 10:40am

## 2021-08-12 ENCOUNTER — Ambulatory Visit (INDEPENDENT_AMBULATORY_CARE_PROVIDER_SITE_OTHER): Payer: PPO | Admitting: Family Medicine

## 2021-08-12 ENCOUNTER — Encounter: Payer: Self-pay | Admitting: Family Medicine

## 2021-08-12 ENCOUNTER — Other Ambulatory Visit: Payer: Self-pay

## 2021-08-12 VITALS — BP 108/60 | HR 57 | Temp 98.2°F | Ht 72.0 in | Wt 230.0 lb

## 2021-08-12 DIAGNOSIS — E7800 Pure hypercholesterolemia, unspecified: Secondary | ICD-10-CM

## 2021-08-12 DIAGNOSIS — I10 Essential (primary) hypertension: Secondary | ICD-10-CM | POA: Diagnosis not present

## 2021-08-12 DIAGNOSIS — K30 Functional dyspepsia: Secondary | ICD-10-CM

## 2021-08-12 DIAGNOSIS — I700 Atherosclerosis of aorta: Secondary | ICD-10-CM

## 2021-08-12 DIAGNOSIS — I5032 Chronic diastolic (congestive) heart failure: Secondary | ICD-10-CM

## 2021-08-12 DIAGNOSIS — R0989 Other specified symptoms and signs involving the circulatory and respiratory systems: Secondary | ICD-10-CM

## 2021-08-12 MED ORDER — OMEPRAZOLE 20 MG PO CPDR: 20.0000 mg | DELAYED_RELEASE_CAPSULE | Freq: Every day | ORAL | 0 refills | Status: DC

## 2021-08-12 NOTE — Patient Instructions (Addendum)
This does sound like reflux/GERD/indigestion. Even the cough could be related to this.   FOR NOW- hold off on rosuvastatin 20 mg weekly- so do not take any Sunday doses. I do not strongly suspect this is the cause of reflux but just interesting how it lines up with start of your symptoms.   Also I want you to take omeprazole before breakfast for the next 2 weeks ( I am giving you 30 days but I want to see how you do at the end of the 2 weeks so please message me). We will likely trial off for a few days and see if symptoms recur while you are off rosuvastatin. If they do not recur- I may retry the rosuvastatin a few weeks later for another data point.   Recommended follow up: Return for as needed for new, worsening, persistent symptoms. Otherwise keep April visit with m efor recheck

## 2021-08-12 NOTE — Progress Notes (Signed)
Phone [phone] In person visit   Subjective:   Barry Decker is a 88 y.o. year old very pleasant male patient who presents for/with See problem oriented charting Chief Complaint  Patient presents with   Follow-up   Gastroesophageal Reflux    Pt c/o indegestion that started a week ago and has tried [place] with no releif. Pt c/o coughing up phlegm also.    This visit occurred during the SARS-CoV-2 public health emergency.  Safety protocols were in place, including screening questions prior to the visit, additional usage of staff PPE, and extensive cleaning of exam room while observing appropriate contact time as indicated for disinfecting solutions.   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 09/25/2017    Priority: High   Aortic atherosclerosis ([place]) 03/26/2020    Priority: Medium    B12 deficiency 09/23/2019    Priority: Medium    Chronic diastolic CHF (congestive heart failure) ([place]) 03/30/2017    Priority: Medium    Abdominal aortic ectasia (HCC) 04/07/2016    Priority: Medium    HYPERCHOLESTEROLEMIA WITH HIGH HDL 01/31/2007    Priority: Medium    Gout 01/31/2007    Priority: Medium    Essential hypertension 01/31/2007    Priority: Medium    History of malignant neoplasm of large intestine 01/31/2007    Priority: Medium    Osteoarthritis of cervical spine 08/31/2015    Priority: Low   Osteoarthritis of left knee 05/25/2014    Priority: Low   Medication management 12/02/2020   Encounter for therapeutic drug monitoring 05/31/2020   Macrocytic anemia 03/21/2019   Kidney stones    Throat pain in adult 08/31/2015    Medications- reviewed and updated Current Outpatient Medications  Medication Sig Dispense Refill   allopurinol (ZYLOPRIM) 300 MG tablet TAKE 1  TABLET BY MOUTH EVERY DAY 90 tablet 3   amLODipine (NORVASC) 10 MG tablet TAKE 1 TABLET BY MOUTH EVERY DAY 90 tablet 3   amoxicillin (AMOXIL) 500 MG tablet Take 4 tablets (2,000 mg total) by mouth as directed. 1 hour prior to dental work including cleanings 8 tablet 2   cyanocobalamin 1000 MCG tablet Take 1,000 mcg by mouth daily.     diphenhydrAMINE (BENADRYL) 25 mg capsule Take 25 mg by mouth at bedtime as needed for sleep.     furosemide (LASIX) 20 MG tablet TAKE 1 TABLET BY MOUTH EVERY DAY 90 tablet 3   hydrochlorothiazide (MICROZIDE) 12.5 MG capsule TAKE 1 CAPSULE BY MOUTH EVERY DAY 90 capsule 3   omeprazole (PRILOSEC) 20 MG capsule Take 1 capsule (20 mg total) by mouth daily. 30 capsule 0   potassium citrate (UROCIT-K) 10 MEQ (1080 MG) SR tablet Take 1 tablet (10 mEq total) by mouth 3 (three) times daily with meals. 300 tablet 1   quinapril (ACCUPRIL) 40 MG tablet TAKE 1 TABLET BY MOUTH EVERY DAY 90 tablet 3   rosuvastatin (CRESTOR) 20 MG tablet Take 1 tablet (20 mg total) by mouth once a week. 13 tablet 3   warfarin (COUMADIN) 5 MG tablet TAKE 1 TO 1&1/2 TABLETS ONCE DAILY AS DIRECTED BY ANTICOAGULATION CLINIC 140 tablet 0   No current facility-administered medications for this visit.     Objective:  BP 108/60    Pulse (!) 57  Temp 98.2 F (36.8 C)    Ht 6' (1.829 m)    Wt 230 lb (104.3 kg)    SpO2 96%    BMI 31.19 kg/m  Gen: NAD, resting comfortably CV: RRR no murmurs rubs or gallops Lungs: CTAB no crackles, wheeze, rhonchi Abdomen: soft/nontender/nondistended/normal bowel sounds. No rebound or guarding.  Ext: no increased edema Skin: warm, dry     Assessment and Plan   #Concern for indigestion, also with phlegm production S: Patient reports started with indigestion about a week ago noting on a daily basis.  Tums has helped some. He is not on any medication for reflux at baseline. Gets a pressure sensation or burning in lower chest/epigastric area and when he belches or  takes tums notes relief of this. Discomfort maybe 1-2. Can also get burning in upper throat area. A few weeks ago stopped hard liquor and had wine before dinner or after diner- only change he has made. Occurs anytime he eats and resolves within 15-20 minutes of tums. No other major changes in diet.  - no shortness of breath or left arm or neck pain or exertional symptoms.   - Is worse with bigger meals - knows to avoid nsaids due to being on coumadin.  - no recent medication changes other than rosuvastatin- which started week before last on sunday  He also has noted coughing up phlegm during this time- perhaps slightly before the reflux was noted.   A/P:  From AVS:  " This does sound like reflux/GERD/indigestion. Even the cough could be related to this.   FOR NOW- hold off on rosuvastatin 20 mg weekly- so do not take any Sunday doses. I do not strongly suspect this is the cause of reflux but just interesting how it lines up with start of your symptoms.   Also I want you to take omeprazole before breakfast for the next 2 weeks ( I am giving you 30 days but I want to see how you do at the end of the 2 weeks so please message me). We will likely trial off for a few days and see if symptoms recur while you are off rosuvastatin. If they do not recur- I may retry the rosuvastatin a few weeks later for another data point.  "  -Rosuvastatin does have slight associations with pancreatitis but no epigastric pain with palpation and I think that is low risk-hold off on labs for now - With typical reflux pattern and resolution with Tums strongly doubt cardiac cause of pain also no exertional element-we did not proceed on cardiac work-up-certainly could if new or worsening symptoms  #hypertension #Chronic diastolic CHF S: medication: Amlodipine 10 mg, Lasix 20 mg daily, quinapril 40 mg daily, hydrochlorothiazide 12.5 mg daily  Weight stable from last visit in our office at 230-denies incrased edema   BP  Readings from Last 3 Encounters:  08/12/21 108/60  06/21/21 125/67  05/04/21 130/70  A/P: Blood pressure controlled. Continue current medications.    Appears euvolemic for CHF-continue current medication   #hyperlipidemia #Aortic atherosclerosis S: Medication: Rosuvastatin 20 mg once a week Lab Results  Component Value Date   CHOL 197 04/01/2021   HDL 61.50 04/01/2021   LDLCALC 112 (H) 04/01/2021   LDLDIRECT 136.2 10/25/2012   TRIG 119.0 04/01/2021   CHOLHDL 3 04/01/2021   A/P: We opted at last visit to try to bring down LDL at least below 100 due to aortic atherosclerosis-suspect improved but has only had medication for 2 weeks plus  we are going to try off medicine-hold off on repeat lipids for now and actually stop medicine for now as above   Recommended follow up: Return for as needed for new, worsening, persistent symptoms. Future Appointments  Date Time Provider [place]  08/17/2021 11:00 AM [facility]  10/05/2021 10:00 AM [name], MD [facility]  01/24/2022  3:00 PM [facility] PHARMACIST [facility]  03/21/2022 10:15 AM [facility] PHLEBOTOMIST [facility] None  03/21/2022 10:40 AM Ladell Pier, MD [facility] None  04/04/2022  9:40 AM [name], MD [facility]  06/09/2022 11:00 AM [facility] HEALTH COACH [facility]   Lab/Order associations:   ICD-10-CM   1. Phlegm in throat  R09.89     2. Indigestion  K30     3. Essential hypertension  I10     4. HYPERCHOLESTEROLEMIA WITH HIGH HDL  E78.00     5. Chronic diastolic CHF (congestive heart failure) (HCC)  I50.32     6. Aortic atherosclerosis (HCC) Chronic I70.0      Meds ordered this encounter  Medications   omeprazole (PRILOSEC) 20 MG capsule    Sig: Take 1 capsule (20 mg total) by mouth daily.    Dispense:  30 capsule    Refill:  0    Return precautions advised.  Garret Reddish, MD

## 2021-08-12 NOTE — Progress Notes (Deleted)
Error

## 2021-08-16 DIAGNOSIS — I10 Essential (primary) hypertension: Secondary | ICD-10-CM

## 2021-08-16 DIAGNOSIS — E7800 Pure hypercholesterolemia, unspecified: Secondary | ICD-10-CM

## 2021-08-17 ENCOUNTER — Ambulatory Visit (INDEPENDENT_AMBULATORY_CARE_PROVIDER_SITE_OTHER): Payer: PPO

## 2021-08-17 ENCOUNTER — Other Ambulatory Visit: Payer: Self-pay

## 2021-08-17 DIAGNOSIS — I8290 Acute embolism and thrombosis of unspecified vein: Secondary | ICD-10-CM

## 2021-08-17 DIAGNOSIS — Z5181 Encounter for therapeutic drug level monitoring: Secondary | ICD-10-CM | POA: Diagnosis not present

## 2021-08-17 LAB — POCT INR: INR: 2.2 (ref 2.0–3.0)

## 2021-08-17 NOTE — Patient Instructions (Signed)
Description   ?Continue taking Warfarin 1.5 tablets daily except 1 tablet on Fridays. Recheck in 5 weeks. [facility] [phone], Main number [phone].   ?  ?   ?

## 2021-08-18 ENCOUNTER — Other Ambulatory Visit: Payer: Self-pay | Admitting: Family Medicine

## 2021-08-26 NOTE — Progress Notes (Incomplete)
? ?Phone [phone] ?In person visit ?  ?Subjective:  ? ?Barry Decker is a 88 y.o. year old very pleasant male patient who presents for/with See problem oriented charting ?No chief complaint on file. ? ? ?This visit occurred during the SARS-CoV-2 public health emergency.  Safety protocols were in place, including screening questions prior to the visit, additional usage of staff PPE, and extensive cleaning of exam room while observing appropriate contact time as indicated for disinfecting solutions.  ? ?Past Medical History-  ?Patient Active Problem List  ? Diagnosis Date Noted  ? Medication management 12/02/2020  ? Thrombus 05/31/2020  ? Encounter for therapeutic drug monitoring 05/31/2020  ? Ascending aortic aneurysm 03/26/2020  ? Aortic atherosclerosis ([place]) 03/26/2020  ? B12 deficiency 09/23/2019  ? Macrocytic anemia 03/21/2019  ? 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 Kidney stones   ? Chronic diastolic CHF (congestive heart failure) ([place]) 03/30/2017  ? Abdominal aortic ectasia ([place]) 04/07/2016  ? Osteoarthritis of cervical spine 08/31/2015  ? Throat pain in adult 08/31/2015  ? Osteoarthritis of left knee 05/25/2014  ? HYPERCHOLESTEROLEMIA WITH HIGH HDL 01/31/2007  ? Gout 01/31/2007  ? Essential hypertension 01/31/2007  ? History of malignant neoplasm of large intestine 01/31/2007  ? ? ?Medications- reviewed and updated ?Current Outpatient Medications  ?Medication Sig Dispense Refill  ? allopurinol (ZYLOPRIM) 300 MG tablet TAKE 1 TABLET BY MOUTH EVERY DAY 90 tablet 3  ? amLODipine (NORVASC) 10 MG tablet TAKE 1 TABLET BY MOUTH EVERY DAY 90 tablet 3  ? amoxicillin (AMOXIL) 500 MG tablet Take 4 tablets (2,000 mg total) by mouth as directed. 1 hour prior to dental work including cleanings 8 tablet 2  ? cyanocobalamin 1000 MCG tablet Take 1,000 mcg by mouth daily.    ? diphenhydrAMINE (BENADRYL) 25 mg capsule Take 25 mg by mouth at bedtime as needed for sleep.     ? furosemide (LASIX) 20 MG tablet TAKE 1 TABLET BY MOUTH EVERY DAY 90 tablet 3  ? hydrochlorothiazide (MICROZIDE) 12.5 MG capsule TAKE 1 CAPSULE BY MOUTH EVERY DAY 90 capsule 3  ? omeprazole (PRILOSEC) 20 MG capsule TAKE 1 CAPSULE BY MOUTH EVERY DAY 30 capsule 0  ? potassium citrate (UROCIT-K) 10 MEQ (1080 MG) SR tablet Take 1 tablet (10 mEq total) by mouth 3 (three) times daily with meals. 300 tablet 1  ? quinapril (ACCUPRIL) 40 MG tablet TAKE 1 TABLET BY MOUTH EVERY DAY 90 tablet 3  ? rosuvastatin (CRESTOR) 20 MG tablet Take 1 tablet (20 mg total) by mouth once a week. 13 tablet 3  ? warfarin (COUMADIN) 5 MG tablet TAKE 1 TO 1&1/2 TABLETS ONCE DAILY AS DIRECTED BY ANTICOAGULATION CLINIC 140 tablet 0  ? ?No current facility-administered medications for this visit.  ? ?  ?Objective:  ?There were no vitals taken for this visit. ?Gen: NAD, resting comfortably ?CV: RRR no murmurs rubs or gallops ?Lungs: CTAB no crackles, wheeze, rhonchi ?Abdomen: soft/nontender/nondistended/normal bowel sounds. No rebound or guarding.  ?Ext: no edema ?Skin: warm, dry ?Neuro: grossly normal, moves all extremities ? ?*** ?  ? ?Assessment and Plan  ? ?Of note, there is a suspected T8 compression fracture/vertebral body ?fracture which is not well characterized on the current protocol. If ?the patient has ongoing symptoms at this site and further imaging is ?warranted, consider a dedicated thoracic MR, or alternatively CT.***  ? ?#Concern for indigestion, also with phlegm production ?S: Patient reported started with indigestion.  Tums had helped some.  He was not on any medication for reflux at baseline. Was getting a pressure sensation or burning in lower chest/epigastric area and when he belched or takes tums notes relief of this. Discomfort maybe 1-2. Can also get burning in upper throat area. A few weeks ago before 07/2021 visit, he stopped hard liquor and had wine before dinner or after diner- only change he had made. Occurs anytime  he ate and resolved within 15-20 minutes of tums. No other major changes in diet.  ?- no shortness of breath or left arm or neck pain or exertional symptoms.   ?-  worsened with bigger meals ?- knows to avoid nsaids due to being on coumadin.  ?- no recent medication changes other than rosuvastatin ?He also noted coughing up phlegm during this time- perhaps slightly before the reflux was noted.   ?A/P: *** ?  ?#hypertension ?#Chronic diastolic CHF ?S: medication: Amlodipine 10 mg, Lasix 20 mg daily, quinapril 40 mg daily, hydrochlorothiazide 12.5 mg daily ?  ?Weight stable from last visit in our office at 230-denied increased edema   ?Home readings #s: *** ?BP Readings from Last 3 Encounters:  ?08/12/21 108/60  ?06/21/21 125/67  ?05/04/21 130/70  ?A/P: *** ? ? ?#hyperlipidemia ?#Aortic atherosclerosis ?S: Medication: Rosuvastatin 20 mg once a week ?Lab Results  ?Component Value Date  ? CHOL 197 04/01/2021  ? HDL 61.50 04/01/2021  ? LDLCALC 112 (H) 04/01/2021  ? LDLDIRECT 136.2 10/25/2012  ? TRIG 119.0 04/01/2021  ? CHOLHDL 3 04/01/2021  ? A/P: *** ? ? ?There are no preventive care reminders to display for this patient. ? ?Recommended follow up: No follow-ups on file. ?Future Appointments  ?Date Time Provider [place]  ?09/21/2021 11:15 AM [facility]  ?10/05/2021 10:00 AM [name], MD [facility]  ?01/24/2022  3:00 PM [facility] C[facility] PHARMACIST [facility]  ?03/21/2022 10:15 AM [facility] PHLEBOTOMIST [facility] None  ?03/21/2022 10:40 AM Benay Spice Izola Price, MD [facility] None  ?04/04/2022  9:40 AM [name], MD [facility]  ?06/09/2022 11:00 AM [facility] HEALTH COACH [facility]  ? ? ?Lab/Order associations: ?No diagnosis found. ? ?No orders of the defined types were placed in this encounter. ? ? ?Return precautions advised.  ?Burnett Corrente ? ? ?

## 2021-08-27 ENCOUNTER — Encounter: Payer: Self-pay | Admitting: Family Medicine

## 2021-09-13 ENCOUNTER — Encounter: Payer: Self-pay | Admitting: Family Medicine

## 2021-09-14 ENCOUNTER — Other Ambulatory Visit: Payer: Self-pay

## 2021-09-14 MED ORDER — OMEPRAZOLE 40 MG PO CPDR: 40.0000 mg | DELAYED_RELEASE_CAPSULE | Freq: Every day | ORAL | 3 refills | Status: DC

## 2021-09-16 ENCOUNTER — Other Ambulatory Visit: Payer: Self-pay | Admitting: Family Medicine

## 2021-09-16 NOTE — Telephone Encounter (Signed)
Stop quinapril 40 mg on medication list ? ?Start lisinopril 20 mg daily 90 with 3 refills and write in the signature-substitute for quinapril 40 mg ?

## 2021-09-16 NOTE — Telephone Encounter (Signed)
See below, med recalled.  ?

## 2021-09-19 MED ORDER — LISINOPRIL 20 MG PO TABS: 20.0000 mg | ORAL_TABLET | Freq: Every day | ORAL | 3 refills | Status: DC

## 2021-09-21 ENCOUNTER — Ambulatory Visit (INDEPENDENT_AMBULATORY_CARE_PROVIDER_SITE_OTHER): Payer: PPO

## 2021-09-21 DIAGNOSIS — Z5181 Encounter for therapeutic drug level monitoring: Secondary | ICD-10-CM

## 2021-09-21 DIAGNOSIS — I8290 Acute embolism and thrombosis of unspecified vein: Secondary | ICD-10-CM

## 2021-09-21 LAB — POCT INR: INR: 2.2 (ref 2.0–3.0)

## 2021-09-21 NOTE — Patient Instructions (Addendum)
Description   Continue taking Warfarin 1.5 tablets daily except 1 tablet on Fridays. Recheck in 6 weeks. [facility] [phone], Main number [phone].        

## 2021-09-24 ENCOUNTER — Other Ambulatory Visit: Payer: Self-pay | Admitting: Cardiovascular Disease

## 2021-09-26 NOTE — Telephone Encounter (Signed)
Prescription refill request received for warfarin ?Lov: 05/04/21 Barry Decker) ?Next INR check: 11/07/21 ?Warfarin tablet strength: '5mg'$  ? ?Appropriate dose and refill sent to requested pharmacy.  ? ? ?

## 2021-10-03 ENCOUNTER — Encounter: Payer: Self-pay | Admitting: Cardiovascular Disease

## 2021-10-05 ENCOUNTER — Ambulatory Visit: Payer: PPO | Admitting: Family Medicine

## 2021-10-05 DIAGNOSIS — I700 Atherosclerosis of aorta: Secondary | ICD-10-CM

## 2021-10-05 DIAGNOSIS — I5032 Chronic diastolic (congestive) heart failure: Secondary | ICD-10-CM

## 2021-10-05 DIAGNOSIS — I10 Essential (primary) hypertension: Secondary | ICD-10-CM

## 2021-10-05 DIAGNOSIS — E785 Hyperlipidemia, unspecified: Secondary | ICD-10-CM

## 2021-10-21 ENCOUNTER — Ambulatory Visit: Payer: PPO | Admitting: Family Medicine

## 2021-11-02 ENCOUNTER — Ambulatory Visit (HOSPITAL_COMMUNITY): Payer: PPO | Attending: Cardiology

## 2021-11-02 DIAGNOSIS — Z952 Presence of prosthetic heart valve: Secondary | ICD-10-CM | POA: Diagnosis not present

## 2021-11-02 DIAGNOSIS — I7121 Aneurysm of the ascending aorta, without rupture: Secondary | ICD-10-CM | POA: Diagnosis not present

## 2021-11-02 DIAGNOSIS — I5032 Chronic diastolic (congestive) heart failure: Secondary | ICD-10-CM

## 2021-11-02 DIAGNOSIS — I447 Left bundle-branch block, unspecified: Secondary | ICD-10-CM | POA: Diagnosis not present

## 2021-11-02 LAB — ECHOCARDIOGRAM COMPLETE
AR max vel: 3.4 cm2
AV Area VTI: 3.62 cm2
AV Area mean vel: 3.15 cm2
AV Mean grad: 7.3 mmHg
AV Peak grad: 13.7 mmHg
Ao pk vel: 1.85 m/s
Area-P 1/2: 2.62 cm2
S' Lateral: 3.4 cm

## 2021-11-03 ENCOUNTER — Telehealth: Payer: Self-pay | Admitting: Pharmacist

## 2021-11-03 ENCOUNTER — Encounter: Payer: Self-pay | Admitting: Cardiovascular Disease

## 2021-11-03 ENCOUNTER — Ambulatory Visit: Payer: PPO | Admitting: Cardiovascular Disease

## 2021-11-03 VITALS — BP 110/62 | HR 68 | Ht 72.0 in | Wt 230.2 lb

## 2021-11-03 DIAGNOSIS — Z953 Presence of xenogenic heart valve: Secondary | ICD-10-CM

## 2021-11-03 DIAGNOSIS — I10 Essential (primary) hypertension: Secondary | ICD-10-CM

## 2021-11-03 DIAGNOSIS — I712 Thoracic aortic aneurysm, without rupture, unspecified: Secondary | ICD-10-CM | POA: Diagnosis not present

## 2021-11-03 DIAGNOSIS — I447 Left bundle-branch block, unspecified: Secondary | ICD-10-CM | POA: Diagnosis not present

## 2021-11-03 DIAGNOSIS — I5032 Chronic diastolic (congestive) heart failure: Secondary | ICD-10-CM

## 2021-11-03 NOTE — Progress Notes (Signed)
Chief Complaint  Patient presents with   Follow-up    Aortic valve disease   History of Present Illness: 88 yo male with history of HTN, HLD, dilated aortic root and severe aortic stenosis s/p TAVR April 2019 who is here today for follow up. He had rheumatic fever as a child and was followed for aortic stenosis for many years. Echo 07/12/17 showed normal LV systolic function with JGOT=15-72% and severe aortic stenosis. His aortic root was enlarged. Chest CTA 07/23/17 with dilated ascending thoracic aorta with proximal to mid ascending thoracic aorta up to 4.6 cm. Cardiac cath February 2019 with mild non-obstructive CAD. He had TAVR 09/25/17 with placement of a 29 mm Edwards Sapien 3 valve from the transfemoral approach. Echo May 2019 with normal LV systolic function and normally functioning bioprosthetic AVR with mild perivalvular AI. Echo November 2021 with LVEF=50%. Elevated gradient across aortic bioprosthetic valve. Gated cardiac CTA December 2021 with stable ascending aortic aneurysm around 4.6-4.8 cm. Mild CAD. Edwards Sapien 3 THV with hypo-attenuating leaflet thickening (HALT) and hypo attenuation affecting motion (HAM). Coumadin was started. Echo February 2022 with AV gradient down to 10 mmHg. LVEF=55-60%. His coumadin was stopped after six months. Echo in June 2022 with elevated aortic valve gradient 19 mmHg. Coumadin was restarted. Echo May 2023 with LVEF=60-65%. The AVR is working well with trivial AI, mean gradient 7 mmHg.  Chest MRA June 2022 with stable 4.6 cm ascending aortic aneurysm.  He is here today for follow up. The patient denies any chest pain, dyspnea, palpitations, lower extremity edema, orthopnea, PND, dizziness, near syncope or syncope.     Primary Care Physician: [name], MD  Past Medical History:  Diagnosis Date   Aortic atherosclerosis [facility])    Aortic valve disease    a. severe aortic stenosis s/p TAVR 09/2017.   Arthritis    "spinal" (11/16/2015)   Atypical  nevus 05/14/2013   moderate atypia - left scapula   Colon cancer (HCC)    precancer-partial colectomy   Dilated aortic root (HCC)    Elevated HDL    First degree AV block    First degree AV block    Gout    Heart murmur    History of kidney stones    Hyperlipidemia    Hypertension    LBBB (left bundle branch block)    Nonobstructive atherosclerosis of coronary artery    Squamous cell skin cancer 08/04/2011   Left outer brow - CX3 + 5FU   Thoracic aortic aneurysm [facility])     Past Surgical History:  Procedure Laterality Date   BACK SURGERY     CATARACT EXTRACTION W/ INTRAOCULAR LENS  IMPLANT, BILATERAL Bilateral 2017   COLONOSCOPY W/ POLYPECTOMY     FACET JOINT INJECTION  2009   LITHOTRIPSY  2010   LUMBAR LAMINECTOMY  ~ [place]   precancerours   PARTIAL KNEE ARTHROPLASTY Left 11/16/2015   Procedure: LEFT KNEE UNICOMPARTMENTAL ;  Surgeon: [name], MD;  Location: [place];  Service: Orthopedics;  Laterality: Left;   REPLACEMENT UNICONDYLAR JOINT KNEE Left 11/16/2015   RIGHT/LEFT HEART CATH AND CORONARY ANGIOGRAPHY N/A 08/10/2017   Procedure: RIGHT/LEFT HEART CATH AND CORONARY ANGIOGRAPHY;  Surgeon: [name], MD;  Location: [place] CV LAB;  Service: Cardiovascular;  Laterality: N/A;   TEE WITHOUT CARDIOVERSION N/A 09/25/2017   Procedure: TRANSESOPHAGEAL ECHOCARDIOGRAM (TEE);  Surgeon: [name], MD;  Location: [place];  Service: Open Heart Surgery;  Laterality: N/A;   TONSILLECTOMY     TRANSCATHETER AORTIC VALVE REPLACEMENT, TRANSFEMORAL N/A 09/25/2017   Procedure: TRANSCATHETER AORTIC VALVE REPLACEMENT, TRANSFEMORAL;  Surgeon: [name], MD;  Location: [place];  Service: Open Heart Surgery;  Laterality: N/A;   TYMPANOPLASTY Left 1998   ruptured ear drum with skin graft    Current Outpatient Medications  Medication Sig Dispense Refill   allopurinol (ZYLOPRIM) 300 MG tablet TAKE 1 TABLET BY MOUTH EVERY DAY 90  tablet 3   amLODipine (NORVASC) 10 MG tablet TAKE 1 TABLET BY MOUTH EVERY DAY 90 tablet 3   amoxicillin (AMOXIL) 500 MG tablet Take 4 tablets (2,000 mg total) by mouth as directed. 1 hour prior to dental work including cleanings 8 tablet 2   cyanocobalamin 1000 MCG tablet Take 1,000 mcg by mouth daily.     diphenhydrAMINE (BENADRYL) 25 mg capsule Take 25 mg by mouth at bedtime as needed for sleep.     furosemide (LASIX) 20 MG tablet TAKE 1 TABLET BY MOUTH EVERY DAY 90 tablet 3   hydrochlorothiazide (MICROZIDE) 12.5 MG capsule TAKE 1 CAPSULE BY MOUTH EVERY DAY 90 capsule 3   lisinopril (ZESTRIL) 20 MG tablet Take 1 tablet (20 mg total) by mouth daily. 90 tablet 3   omeprazole (PRILOSEC) 40 MG capsule Take 1 capsule (40 mg total) by mouth daily. 90 capsule 3   potassium citrate (UROCIT-K) 10 MEQ (1080 MG) SR tablet Take 1 tablet (10 mEq total) by mouth 3 (three) times daily with meals. 300 tablet 1   quinapril (ACCUPRIL) 40 MG tablet TAKE 1 TABLET BY MOUTH EVERY DAY 90 tablet 3   rosuvastatin (CRESTOR) 20 MG tablet Take 1 tablet (20 mg total) by mouth once a week. 13 tablet 3   warfarin (COUMADIN) 5 MG tablet TAKE 1 TO 1&1/2 TABLETS ONCE DAILY AS DIRECTED BY ANTICOAGULATION CLINIC 140 tablet 0   No current facility-administered medications for this visit.    No Known Allergies  Social History   Socioeconomic History   Marital status: Married    Spouse name: Not on file   Number of children: 5   Years of education: Not on file   Highest education level: Not on file  Occupational History   Occupation: Government social research officer  Tobacco Use   Smoking status: Former    Packs/day: 1.00    Years: 20.00    Pack years: 20.00    Types: Cigarettes    Quit date: 09/15/1980    Years since quitting: 41.1   Smokeless tobacco: Never  Vaping Use   Vaping Use: Never used  Substance and Sexual Activity   Alcohol use: Yes    Alcohol/week: 7.0 standard drinks    Types: 1 Glasses of wine, 1 Shots  of liquor, 5 Standard drinks or equivalent per week   Drug use: No   Sexual activity: Never  Other Topics Concern   Not on file  Social History Narrative   Married. 5 children. 4 grandkids. 1 greatgrandkid.       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       Hobbies: woodworking      Advised to consider advanced directives/hcpoa-thinks wife may have 23 from years ago.   Social Determinants of Health   Financial Resource Strain: Low Risk    Difficulty of Paying Living Expenses: Not hard at all  Food Insecurity: No Food Insecurity   Worried About Charity fundraiser in the Last Year: Never true   Ran Out  of Food in the Last Year: Never true  Transportation Needs: No Transportation Needs   Lack of Transportation (Medical): No   Lack of Transportation (Non-Medical): No  Physical Activity: Insufficiently Active   Days of Exercise per Week: 4 days   Minutes of Exercise per Session: 30 min  Stress: No Stress Concern Present   Feeling of Stress : Not at all  Social Connections: Socially Integrated   Frequency of Communication with Friends and Family: More than three times a week   Frequency of Social Gatherings with Friends and Family: More than three times a week   Attends Religious Services: More than 4 times per year   Active Member of [employer] or Organizations: Yes   Attends Archivist Meetings: 1 to 4 times per year   Marital Status: Married  Human resources officer Violence: Not At Risk   Fear of Current or Ex-Partner: No   Emotionally Abused: No   Physically Abused: No   Sexually Abused: No    Family History  Problem Relation Age of Onset   Heart attack Mother 53       former smoker   Heart attack Father 36       former smoker   Hypertension Sister    Heart attack Brother 26       Rheumatic fever    Review of Systems:  As stated in the HPI and otherwise negative.   BP 110/62   Pulse 68   Ht 6' (1.829 m)   Wt 230 lb 3.2 oz (104.4 kg)    SpO2 97%   BMI 31.22 kg/m   Physical Examination:  General: Well developed, well nourished, NAD  HEENT: OP clear, mucus membranes moist  SKIN: warm, dry. No rashes. Neuro: No focal deficits  Musculoskeletal: Muscle strength 5/5 all ext  Psychiatric: Mood and affect normal  Neck: No JVD, no carotid bruits, no thyromegaly, no lymphadenopathy.  Lungs:Clear bilaterally, no wheezes, rhonci, crackles Cardiovascular: Regular rate and rhythm. No murmurs, gallops or rubs. Abdomen:Soft. Bowel sounds present. Non-tender.  Extremities: No lower extremity edema. Pulses are 2 + in the bilateral DP/PT.  Echo May 2023:  1. Left ventricular ejection fraction, by estimation, is 60 to 65%. Left  ventricular ejection fraction by 3D volume is 63 %. The left ventricle has  normal function. The left ventricle has no regional wall motion  abnormalities. There is severe concentric   left ventricular hypertrophy. Left ventricular diastolic parameters are  consistent with Grade I diastolic dysfunction (impaired relaxation). The  average left ventricular global longitudinal strain is -21.6 %. The global  longitudinal strain is normal.   2. Right ventricular systolic function is normal. The right ventricular  size is normal. There is normal pulmonary artery systolic pressure. The  estimated right ventricular systolic pressure is 15.4 mmHg.   3. Left atrial size was severely dilated.   4. The mitral valve is normal in structure. Trivial mitral valve  regurgitation. No evidence of mitral stenosis.   5. The aortic valve has been repaired/replaced. Aortic valve  regurgitation is trivial. No aortic stenosis is present. There is a 29 mm  Sapien prosthetic (TAVR) valve present in the aortic position. Procedure  Date: 09/25/17. Aortic valve area, by VTI  measures 3.62 cm. Aortic valve mean gradient measures 7.2 mmHg. Aortic  valve Vmax measures 1.85 m/s.   6. Aortic dilatation noted. Aneurysm of the aortic root,  measuring 49 mm.  Aneurysm of the ascending aorta, measuring 49 mm.   7.  The inferior vena cava is dilated in size with >50% respiratory  variability, suggesting right atrial pressure of 8 mmHg.   8. Compared to prior echo, the mean TAVR gradient has decreased from  67mHg to 7.287mg, DVI is normal at 0.70 and there is tirival perivalvular  AI at 1pm on the PSAX view.   EKG:  EKG is  not ordered today. The ekg ordered today demonstrates   Recent Labs: 04/01/2021: ALT 13; BUN 25; Creatinine, Ser 1.20; Potassium 4.1; Sodium 137 06/21/2021: Hemoglobin 13.4; Platelet Count 195   Lipid Panel    Component Value Date/Time   CHOL 197 04/01/2021 1000   TRIG 119.0 04/01/2021 1000   TRIG 87 06/22/2006 1013   HDL 61.50 04/01/2021 1000   CHOLHDL 3 04/01/2021 1000   VLDL 23.8 04/01/2021 1000   LDLCALC 112 (H) 04/01/2021 1000   LDLCALC 108 (H) 03/26/2020 1128   LDLDIRECT 136.2 10/25/2012 1009     Wt Readings from Last 3 Encounters:  11/03/21 230 lb 3.2 oz (104.4 kg)  08/12/21 230 lb (104.3 kg)  06/21/21 234 lb (106.1 kg)     Other studies Reviewed: Additional studies/ records that were reviewed today include: . Review of the above records demonstrates:   Assessment and Plan:   1. Severe AS: He is s/p TAVR. He has had evidence of HALT and HAM so is now on coumadin. Mean gradient across the aortic valve yesterday was 7 mmHg. Will continue coumadin. No ASA since he is on coumain.  Continue SBE prophylaxis as needed.    2. HTN: BP is controlled. No changes  3. Chronic diastolic CHF: Weight is stable. No volume overload on exam. Continue Lasix.   4. Thoracic aortic aneurysm: Stable ascending aortic aneurysm by chest MRA June 2022. Repeat June 2023.   5. LBBB: New post TAVR. No dizziness or syncope.  Current medicines are reviewed at length with the patient today.  The patient does not have concerns regarding medicines.  The following changes have been made:  no change  Labs/ tests  ordered today include:   Orders Placed This Encounter  Procedures   MR Angiogram Chest W Wo Contrast    Disposition:  Follow up six months.    Signed, ChLauree ChandlerMD 11/03/2021 10:11 AM    [facility] [address]  [id number]: [phone]: [phone]

## 2021-11-03 NOTE — Progress Notes (Signed)
Chronic Care Management Pharmacy Assistant   Name: Barry Decker  MRN: [id number] DOB: May 19, 1933   Reason for Encounter: Hypertension Adherence Call    Recent office visits:  08/12/2021 OV (PCP) [name], MD; FOR NOW- hold off on rosuvastatin 20 mg weekly- so do not take any Sunday doses. I do not strongly suspect this is the cause of reflux but just interesting how it lines up with start of your symptoms  Recent consult visits:  None  Hospital visits:  None in previous 6 months  Medications: Outpatient Encounter Medications as of 11/03/2021  Medication Sig   allopurinol (ZYLOPRIM) 300 MG tablet TAKE 1 TABLET BY MOUTH EVERY DAY   amLODipine (NORVASC) 10 MG tablet TAKE 1 TABLET BY MOUTH EVERY DAY   amoxicillin (AMOXIL) 500 MG tablet Take 4 tablets (2,000 mg total) by mouth as directed. 1 hour prior to dental work including cleanings   cyanocobalamin 1000 MCG tablet Take 1,000 mcg by mouth daily.   diphenhydrAMINE (BENADRYL) 25 mg capsule Take 25 mg by mouth at bedtime as needed for sleep.   furosemide (LASIX) 20 MG tablet TAKE 1 TABLET BY MOUTH EVERY DAY   hydrochlorothiazide (MICROZIDE) 12.5 MG capsule TAKE 1 CAPSULE BY MOUTH EVERY DAY   lisinopril (ZESTRIL) 20 MG tablet Take 1 tablet (20 mg total) by mouth daily.   omeprazole (PRILOSEC) 40 MG capsule Take 1 capsule (40 mg total) by mouth daily.   potassium citrate (UROCIT-K) 10 MEQ (1080 MG) SR tablet Take 1 tablet (10 mEq total) by mouth 3 (three) times daily with meals.   quinapril (ACCUPRIL) 40 MG tablet TAKE 1 TABLET BY MOUTH EVERY DAY   rosuvastatin (CRESTOR) 20 MG tablet Take 1 tablet (20 mg total) by mouth once a week.   warfarin (COUMADIN) 5 MG tablet TAKE 1 TO 1&1/2 TABLETS ONCE DAILY AS DIRECTED BY ANTICOAGULATION CLINIC   No facility-administered encounter medications on file as of 11/03/2021.   Reviewed chart prior to disease state call. Spoke with patient regarding BP  Recent Office Vitals: BP  Readings from Last 3 Encounters:  11/03/21 110/62  08/12/21 108/60  06/21/21 125/67   Pulse Readings from Last 3 Encounters:  11/03/21 68  08/12/21 (!) 57  06/21/21 65    Wt Readings from Last 3 Encounters:  11/03/21 230 lb 3.2 oz (104.4 kg)  08/12/21 230 lb (104.3 kg)  06/21/21 234 lb (106.1 kg)     Kidney Function Lab Results  Component Value Date/Time   CREATININE 1.20 04/01/2021 10:00 AM   CREATININE 1.02 11/11/2020 10:44 AM   CREATININE 0.99 03/26/2020 11:28 AM   CREATININE 0.92 04/20/2016 12:08 PM   GFR 54.88 (L) 04/01/2021 10:00 AM   GFRNONAA 61 05/11/2020 09:45 AM   GFRNONAA 69 03/26/2020 11:28 AM   GFRAA 70 05/11/2020 09:45 AM   GFRAA 80 03/26/2020 11:28 AM       Latest Ref Rng & Units 04/01/2021   10:00 AM 11/11/2020   10:44 AM 09/27/2020   11:34 AM  BMP  Glucose 70 - 99 mg/dL 85   97   87    BUN 6 - 23 mg/dL '25   21   15    '$ Creatinine 0.40 - 1.50 mg/dL 1.20   1.02   1.05    BUN/Creat Ratio 10 - 24  21     Sodium 135 - 145 mEq/L 137   139   140    Potassium 3.5 - 5.1 mEq/L 4.1  4.7   4.7    Chloride 96 - 112 mEq/L 101   103   105    CO2 19 - 32 mEq/L '30   28   30    '$ Calcium 8.4 - 10.5 mg/dL 9.4   9.8   9.3     Did you start statin?  Current antihypertensive regimen:  Lisinopril 20 mg daily Quanapril 40 mg daily  How often are you checking your Blood Pressure? several times per month  Current home BP readings: 102/50 - 11/04/2021  What recent interventions/DTPs have been made by any provider to improve Blood Pressure control since last CPP Visit: Stopping HCTZ.  Any recent hospitalizations or ED visits since last visit with CPP? No  What diet changes have been made to improve Blood Pressure Control?  No recent diet changes.  What exercise is being done to improve your Blood Pressure Control?  Patient states he exercises some but not as much as he should.  Adherence Review: Is the patient currently on ACE/ARB medication? Yes Does the patient  have >5 day gap between last estimated fill dates? No  Care Gaps: Medicare Annual Wellness: Completed 05/27/2021 Hemoglobin A1C: 5.2% on 09/21/2017 Colonoscopy: Completed 10/21/2009  Future Appointments  Date Time Provider [place]  11/04/2021  8:20 AM [name], MD [facility]  11/07/2021 11:00 AM [facility]  11/28/2021 11:00 AM [facility]  01/24/2022  3:00 PM [facility] PHARMACIST [facility]  03/21/2022 10:15 AM [facility] PHLEBOTOMIST [facility] None  03/21/2022 10:40 AM Ladell Pier, MD [facility] None  04/04/2022 10:00 AM [name], MD [facility]  06/09/2022 11:00 AM [facility] HEALTH COACH [facility]    Star Rating Drugs: Lisinopril 20 mg last filled 09/19/2021 90 DS Rosuvastatin 20 mg 10/20/2021 90 DS  April D Calhoun, [place] Pharmacist Assistant [phone]

## 2021-11-03 NOTE — Patient Instructions (Signed)
Medication Instructions:  No changes *If you need a refill on your cardiac medications before your next appointment, please call your pharmacy*   Lab Work: none If you have labs (blood work) drawn today and your tests are completely normal, you will receive your results only by: [place] (if you have MyChart) OR A paper copy in the mail If you have any lab test that is abnormal or we need to change your treatment, we will call you to review the results.   Testing/Procedures: MRA Chest - due June 2023   Follow-Up: At [facility], you and your health needs are our priority.  As part of our continuing mission to provide you with exceptional heart care, we have created designated Provider Care Teams.  These Care Teams include your primary Cardiologist (physician) and Advanced Practice Providers (APPs -  Physician Assistants and Nurse Practitioners) who all work together to provide you with the care you need, when you need it.   Your next appointment:   12 month(s)  The format for your next appointment:   In Person  Provider:   Lauree Chandler, MD     Important Information About Sugar

## 2021-11-04 ENCOUNTER — Telehealth: Payer: Self-pay

## 2021-11-04 ENCOUNTER — Other Ambulatory Visit: Payer: Self-pay

## 2021-11-04 ENCOUNTER — Ambulatory Visit (INDEPENDENT_AMBULATORY_CARE_PROVIDER_SITE_OTHER): Payer: PPO | Admitting: Family Medicine

## 2021-11-04 ENCOUNTER — Other Ambulatory Visit: Payer: Self-pay | Admitting: Family Medicine

## 2021-11-04 ENCOUNTER — Encounter: Payer: Self-pay | Admitting: Family Medicine

## 2021-11-04 VITALS — BP 102/50 | HR 65 | Temp 97.9°F | Ht 72.0 in | Wt 232.0 lb

## 2021-11-04 DIAGNOSIS — I7121 Aneurysm of the ascending aorta, without rupture: Secondary | ICD-10-CM

## 2021-11-04 DIAGNOSIS — E7800 Pure hypercholesterolemia, unspecified: Secondary | ICD-10-CM | POA: Diagnosis not present

## 2021-11-04 DIAGNOSIS — D649 Anemia, unspecified: Secondary | ICD-10-CM

## 2021-11-04 DIAGNOSIS — I10 Essential (primary) hypertension: Secondary | ICD-10-CM

## 2021-11-04 DIAGNOSIS — R748 Abnormal levels of other serum enzymes: Secondary | ICD-10-CM

## 2021-11-04 LAB — CBC WITH DIFFERENTIAL/PLATELET
Basophils Absolute: 0 10*3/uL (ref 0.0–0.1)
Basophils Relative: 0.6 % (ref 0.0–3.0)
Eosinophils Absolute: 0.1 10*3/uL (ref 0.0–0.7)
Eosinophils Relative: 3.8 % (ref 0.0–5.0)
HCT: 34.8 % — ABNORMAL LOW (ref 39.0–52.0)
Hemoglobin: 11.8 g/dL — ABNORMAL LOW (ref 13.0–17.0)
Lymphocytes Relative: 27.8 % (ref 12.0–46.0)
Lymphs Abs: 1 10*3/uL (ref 0.7–4.0)
MCHC: 34 g/dL (ref 30.0–36.0)
MCV: 104.2 fl — ABNORMAL HIGH (ref 78.0–100.0)
Monocytes Absolute: 0.3 10*3/uL (ref 0.1–1.0)
Monocytes Relative: 9.4 % (ref 3.0–12.0)
Neutro Abs: 2.1 10*3/uL (ref 1.4–7.7)
Neutrophils Relative %: 58.4 % (ref 43.0–77.0)
Platelets: 220 10*3/uL (ref 150.0–400.0)
RBC: 3.34 Mil/uL — ABNORMAL LOW (ref 4.22–5.81)
RDW: 15.2 % (ref 11.5–15.5)
WBC: 3.6 10*3/uL — ABNORMAL LOW (ref 4.0–10.5)

## 2021-11-04 LAB — COMPREHENSIVE METABOLIC PANEL
ALT: 20 U/L (ref 0–53)
AST: 17 U/L (ref 0–37)
Albumin: 3.8 g/dL (ref 3.5–5.2)
Alkaline Phosphatase: 63 U/L (ref 39–117)
BUN: 30 mg/dL — ABNORMAL HIGH (ref 6–23)
CO2: 29 mEq/L (ref 19–32)
Calcium: 9.5 mg/dL (ref 8.4–10.5)
Chloride: 104 mEq/L (ref 96–112)
Creatinine, Ser: 1.33 mg/dL (ref 0.40–1.50)
GFR: 48.3 mL/min — ABNORMAL LOW (ref >60.00)
Glucose, Bld: 90 mg/dL (ref 70–99)
Potassium: 4.5 mEq/L (ref 3.5–5.1)
Sodium: 139 mEq/L (ref 135–145)
Total Bilirubin: 0.4 mg/dL (ref 0.2–1.2)
Total Protein: 6.8 g/dL (ref 6.0–8.3)

## 2021-11-04 LAB — LDL CHOLESTEROL, DIRECT: Direct LDL: 99 mg/dL

## 2021-11-04 MED ORDER — LISINOPRIL 20 MG PO TABS: 20.0000 mg | ORAL_TABLET | Freq: Every day | ORAL | 3 refills | Status: DC

## 2021-11-04 MED ORDER — QUINAPRIL HCL 20 MG PO TABS
20.0000 mg | ORAL_TABLET | Freq: Every day | ORAL | 3 refills | Status: DC
Start: 2021-11-04 — End: 2021-11-04

## 2021-11-04 NOTE — Telephone Encounter (Signed)
See below

## 2021-11-04 NOTE — Telephone Encounter (Signed)
Please call patient.   Tell him quinapril is not available at all   Remove quinapril from medication list   Please send lisinopril 20 mg daily #90 with 3 refills   Have him stop hydrochlorothiazide and remove from medication list   Please make this a phone note     I called and spoke with pt and reviewed the above, Lisinopril sent to pharmacy and Quinapril/HCTZ has been taken off of pt list.

## 2021-11-04 NOTE — Progress Notes (Signed)
Phone [phone] In person visit   Subjective:   Barry Decker is a 88 y.o. year old very pleasant male patient who presents for/with See problem oriented charting Chief Complaint  Patient presents with   Follow-up   Hypertension    Wants to discuss lisinopril.   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 09/25/2017    Priority: High   Aortic atherosclerosis ([place]) 03/26/2020    Priority: Medium    B12 deficiency 09/23/2019    Priority: Medium    Chronic diastolic CHF (congestive heart failure) ([place]) 03/30/2017    Priority: Medium    HYPERCHOLESTEROLEMIA WITH HIGH HDL 01/31/2007    Priority: Medium    Gout 01/31/2007    Priority: Medium    Essential hypertension 01/31/2007    Priority: Medium    History of malignant neoplasm of large intestine 01/31/2007    Priority: Medium    Osteoarthritis of cervical spine 08/31/2015    Priority: Low   Osteoarthritis of left knee 05/25/2014    Priority: Low   Medication management 12/02/2020   Encounter for therapeutic drug monitoring 05/31/2020   Macrocytic anemia 03/21/2019   Kidney stones    Throat pain in adult 08/31/2015    Medications- reviewed and updated Current Outpatient Medications  Medication Sig Dispense Refill   allopurinol (ZYLOPRIM) 300 MG tablet TAKE 1 TABLET BY MOUTH EVERY DAY 90 tablet 3   amLODipine (NORVASC) 10 MG tablet TAKE 1 TABLET BY MOUTH EVERY DAY 90 tablet 3   amoxicillin (AMOXIL) 500 MG tablet Take 4 tablets (2,000 mg total) by mouth as directed. 1 hour prior to dental work including cleanings 8 tablet 2   cyanocobalamin 1000 MCG tablet Take 1,000 mcg by mouth daily.     diphenhydrAMINE (BENADRYL) 25 mg capsule Take 25 mg by mouth at bedtime as needed for sleep.     furosemide (LASIX)  20 MG tablet TAKE 1 TABLET BY MOUTH EVERY DAY 90 tablet 3   hydrochlorothiazide (MICROZIDE) 12.5 MG capsule TAKE 1 CAPSULE BY MOUTH EVERY DAY 90 capsule 3   omeprazole (PRILOSEC) 40 MG capsule Take 1 capsule (40 mg total) by mouth daily. 90 capsule 3   potassium citrate (UROCIT-K) 10 MEQ (1080 MG) SR tablet Take 1 tablet (10 mEq total) by mouth 3 (three) times daily with meals. 300 tablet 1   rosuvastatin (CRESTOR) 20 MG tablet Take 1 tablet (20 mg total) by mouth once a week. 13 tablet 3   warfarin (COUMADIN) 5 MG tablet TAKE 1 TO 1&1/2 TABLETS ONCE DAILY AS DIRECTED BY ANTICOAGULATION CLINIC 140 tablet 0   quinapril (ACCUPRIL) 20 MG tablet Take 1 tablet (20 mg total) by mouth daily. As long as no recall on 20 mg dose. 90 tablet 3   No current facility-administered medications for this visit.     Objective:  BP (!) 102/50   Pulse 65   Temp 97.9 F (36.6 C)   Ht 6' (1.829 m)   Wt 232 lb (105.2 kg)   SpO2 95%   BMI 31.46 kg/m  Gen: NAD, resting comfortably CV: RRR faint murmur Lungs: CTAB no crackles, wheeze, rhonchi Ext: stable trace edema Skin: warm, dry    Assessment and Plan   #Cardiac concerns-follows with Dr. Angelena Form 1.  Thrombus in left  atrial appendage 2.  Status post TAVR with bioprosthesis due to severe aortic stenosis 3.  Aortic aneurysm ascending-most recent imaging last year and stable- planned in June  -Also with abdominal aortic ectasia noted October 2017 but not noted on CT abdomen pelvis 09/03/2017-we have discontinued screening 4.  Chronic diastolic CHF S: Medication: Patient remains on long-term Coumadin/warfarin monitored by cardiology.  Also on Lasix 20 mg daily  Edema stable  Weight gain- largely stable within 2 lbs- had some recent travel and likely more salt with travel   A/P: Thrombus in left atrial appendage should be stable or resolved with long-term Coumadin.  Just had visit with cardiology and TAVR are is stable after prior severe aortic stenosis.   Planned imaging of aortic aneurysm in June through cardiology.  Diastolic CHF appears stable-continue Lasix   #Nonobstructive CAD and aortic atherosclerosis #hyperlipidemia S: Medication:Rosuvastatin 20 mg once a week -mild sensitivity in right thigh a few weeks ago along with numbness and tingling- could be statin related- could be mild issues from the back Symptoms:no chest pain or shortness of breath  A/P: Nonobstructive coronary artery disease remains asymptomatic.  We are hoping that LDL will up please be below 100 but ideally under 70 with nonobstructive coronary artery disease as well as aortic atherosclerosis (presumed stable).  Update direct LDL with labs today and for now continue current medication  #hypertension S: medication: Amlodipine 10 mg, Lasix 20 mg, hydrochlorothiazide 12.5 mg, lisinopril 20 mg (substitute for quinapril 40 mg- but we are hoping can switch back)  Home readings #s: has cuff BP Readings from Last 3 Encounters:  11/04/21 (!) 102/50  11/03/21 110/62  08/12/21 108/60  A/P: Blood pressure has been running on the lower side but controlled-we will switch back to quinapril (stop lisinopril) and reduce slightly to 20 mg of quinapril okay.  Continue to monitor blood pressure at home and update me in perhaps 2 weeks with home readings through MyChart. -if had further reduction in kidney function-    #Gout S: 0 flares since last visit on allopurinol 300 mg A/P:Controlled. Continue current medications.     # B12 deficiency S: Current treatment/medication (oral vs. IM): Has been tolerating oral therapy (required injections in the past) A/P: doing well on oral most recently- continue to monitor   # GERD S:Medication: omeprazole 40 mg started in February. Symptoms have resolved. Has 2 weeks worth of pills left from initial rx  A/P: reflux doing well. Discussed could trial pepcid 10 or 20 mg over the counter for 2 weeks or so and then discontinue . If has flare up of  reflux- go back to 40 mg and let me know- I can send in 20 mg - we always want to get to lowest effective dose.    #Macrocytic anemia-patient with history of B12 deficiency-saw hematology 10/18/2020 with plan for 1 year repeat-scheduled for follow-up on 03/21/2022  Recommended follow up: Return for next already scheduled visit or sooner if needed. Future Appointments  Date Time Provider [place]  11/07/2021 11:00 AM [facility]  11/28/2021 11:00 AM [facility] 1 [facility]  01/24/2022  3:00 PM [facility] CCM PHARMACIST [facility]  03/21/2022 10:15 AM [facility] PHLEBOTOMIST [facility] None  03/21/2022 10:40 AM Ladell Pier, MD [facility] None  04/04/2022 10:00 AM [name], MD [facility]  06/09/2022 11:00 AM [facility] HEALTH COACH [facility]   Lab/Order associations: NOT fasting   ICD-10-CM   1. Essential hypertension  I10 CBC with  Differential/Platelet    Comprehensive metabolic panel    2. HYPERCHOLESTEROLEMIA WITH HIGH HDL  E78.00 LDL cholesterol, direct    CBC with Differential/Platelet    Comprehensive metabolic panel    3. Aneurysm of ascending aorta without rupture (HCC)  I71.21       Meds ordered this encounter  Medications   quinapril (ACCUPRIL) 20 MG tablet    Sig: Take 1 tablet (20 mg total) by mouth daily. As long as no recall on 20 mg dose.    Dispense:  90 tablet    Refill:  3    Return precautions advised.  Garret Reddish, MD

## 2021-11-04 NOTE — Patient Instructions (Addendum)
reflux doing well. Discussed could trial pepcid 10 or 20 mg over the counter for 2 weeks or so and then discontinue . If has flare up of reflux- go back to 40 mg and let me know- I can send in 20 mg - we always want to get to lowest effective dose.    Blood pressure has been running on the lower side-we will switch back to quinapril (stop lisinopril) and reduce slightly to 20 mg of quinapril okay.  Continue to monitor blood pressure at home and update me in perhaps 2 weeks with home readings through [facility].  Please stop by lab before you go If you have mychart- we will send your results within 3 business days of [place] receiving them.  If you do not have mychart- we will call you about results within 5 business days of [place] receiving them.  *please also note that you will see labs on mychart as soon as they post. I will later go in and write notes on them- will say "notes from Dr. Yong Channel"   Recommended follow up: Return for next already scheduled visit or sooner if needed.

## 2021-11-07 ENCOUNTER — Ambulatory Visit (INDEPENDENT_AMBULATORY_CARE_PROVIDER_SITE_OTHER): Payer: PPO | Admitting: Pharmacist

## 2021-11-07 DIAGNOSIS — Z5181 Encounter for therapeutic drug level monitoring: Secondary | ICD-10-CM

## 2021-11-07 DIAGNOSIS — Z79899 Other long term (current) drug therapy: Secondary | ICD-10-CM

## 2021-11-07 DIAGNOSIS — I8290 Acute embolism and thrombosis of unspecified vein: Secondary | ICD-10-CM

## 2021-11-07 LAB — POCT INR: INR: 3 (ref 2.0–3.0)

## 2021-11-07 NOTE — Patient Instructions (Signed)
Continue taking Warfarin 1.5 tablets daily except 1 tablet on Fridays. Recheck in 6 weeks. Coumadin Clinic [phone], Main number [phone].   

## 2021-11-14 ENCOUNTER — Other Ambulatory Visit: Payer: Self-pay | Admitting: Physician Assistant

## 2021-11-28 ENCOUNTER — Ambulatory Visit (HOSPITAL_COMMUNITY)
Admission: RE | Admit: 2021-11-28 | Discharge: 2021-11-28 | Disposition: A | Discharge status: Home / Self Care | Payer: PPO | Source: Ambulatory Visit | Attending: Cardiovascular Disease | Admitting: Cardiovascular Disease

## 2021-11-28 DIAGNOSIS — I712 Thoracic aortic aneurysm, without rupture, unspecified: Secondary | ICD-10-CM | POA: Diagnosis not present

## 2021-11-28 MED ORDER — GADOBUTROL 1 MMOL/ML IV SOLN
10.0000 mL | Freq: Once | INTRAVENOUS | Status: AC | PRN
  Administered 2021-11-28: 10 mL via INTRAVENOUS

## 2021-12-05 ENCOUNTER — Other Ambulatory Visit (INDEPENDENT_AMBULATORY_CARE_PROVIDER_SITE_OTHER): Payer: PPO

## 2021-12-05 ENCOUNTER — Other Ambulatory Visit: Payer: Self-pay | Admitting: *Deleted

## 2021-12-05 DIAGNOSIS — I712 Thoracic aortic aneurysm, without rupture, unspecified: Secondary | ICD-10-CM

## 2021-12-05 DIAGNOSIS — R748 Abnormal levels of other serum enzymes: Secondary | ICD-10-CM | POA: Diagnosis not present

## 2021-12-05 DIAGNOSIS — D649 Anemia, unspecified: Secondary | ICD-10-CM | POA: Diagnosis not present

## 2021-12-05 LAB — CBC WITH DIFFERENTIAL/PLATELET
Basophils Absolute: 0 10*3/uL (ref 0.0–0.1)
Basophils Relative: 0.6 % (ref 0.0–3.0)
Eosinophils Absolute: 0.2 10*3/uL (ref 0.0–0.7)
Eosinophils Relative: 4.6 % (ref 0.0–5.0)
HCT: 33.9 % — ABNORMAL LOW (ref 39.0–52.0)
Hemoglobin: 11.3 g/dL — ABNORMAL LOW (ref 13.0–17.0)
Lymphocytes Relative: 33.1 % (ref 12.0–46.0)
Lymphs Abs: 1.2 10*3/uL (ref 0.7–4.0)
MCHC: 33.3 g/dL (ref 30.0–36.0)
MCV: 104.8 fl — ABNORMAL HIGH (ref 78.0–100.0)
Monocytes Absolute: 0.4 10*3/uL (ref 0.1–1.0)
Monocytes Relative: 10 % (ref 3.0–12.0)
Neutro Abs: 1.9 10*3/uL (ref 1.4–7.7)
Neutrophils Relative %: 51.7 % (ref 43.0–77.0)
Platelets: 210 10*3/uL (ref 150.0–400.0)
RBC: 3.23 Mil/uL — ABNORMAL LOW (ref 4.22–5.81)
RDW: 15.6 % — ABNORMAL HIGH (ref 11.5–15.5)
WBC: 3.7 10*3/uL — ABNORMAL LOW (ref 4.0–10.5)

## 2021-12-05 LAB — BASIC METABOLIC PANEL
BUN: 21 mg/dL (ref 6–23)
CO2: 27 mEq/L (ref 19–32)
Calcium: 9.3 mg/dL (ref 8.4–10.5)
Chloride: 106 mEq/L (ref 96–112)
Creatinine, Ser: 1.15 mg/dL (ref 0.40–1.50)
GFR: 57.48 mL/min — ABNORMAL LOW (ref >60.00)
Glucose, Bld: 121 mg/dL — ABNORMAL HIGH (ref 70–99)
Potassium: 4.7 mEq/L (ref 3.5–5.1)
Sodium: 139 mEq/L (ref 135–145)

## 2021-12-05 NOTE — Progress Notes (Signed)
Order placed for chest MRA for one year. (Due June 2024)

## 2021-12-08 ENCOUNTER — Other Ambulatory Visit: Payer: Self-pay

## 2021-12-08 DIAGNOSIS — D649 Anemia, unspecified: Secondary | ICD-10-CM

## 2021-12-19 ENCOUNTER — Ambulatory Visit (INDEPENDENT_AMBULATORY_CARE_PROVIDER_SITE_OTHER): Payer: PPO | Admitting: *Deleted

## 2021-12-19 DIAGNOSIS — I8290 Acute embolism and thrombosis of unspecified vein: Secondary | ICD-10-CM | POA: Diagnosis not present

## 2021-12-19 DIAGNOSIS — Z5181 Encounter for therapeutic drug level monitoring: Secondary | ICD-10-CM | POA: Diagnosis not present

## 2021-12-19 LAB — POCT INR: INR: 2.9 (ref 2.0–3.0)

## 2021-12-19 NOTE — Patient Instructions (Signed)
Description   Continue taking Warfarin 1.5 tablets daily except 1 tablet on Fridays. Recheck in 6 weeks. Coumadin Clinic [phone], Main number [phone].        

## 2021-12-28 DIAGNOSIS — L720 Epidermal cyst: Secondary | ICD-10-CM | POA: Diagnosis not present

## 2021-12-28 DIAGNOSIS — L814 Other melanin hyperpigmentation: Secondary | ICD-10-CM | POA: Diagnosis not present

## 2021-12-28 DIAGNOSIS — D2271 Melanocytic nevi of right lower limb, including hip: Secondary | ICD-10-CM | POA: Diagnosis not present

## 2021-12-28 DIAGNOSIS — L821 Other seborrheic keratosis: Secondary | ICD-10-CM | POA: Diagnosis not present

## 2021-12-28 DIAGNOSIS — D1801 Hemangioma of skin and subcutaneous tissue: Secondary | ICD-10-CM | POA: Diagnosis not present

## 2022-01-06 DIAGNOSIS — L720 Epidermal cyst: Secondary | ICD-10-CM | POA: Diagnosis not present

## 2022-01-13 ENCOUNTER — Other Ambulatory Visit: Payer: PPO

## 2022-01-13 ENCOUNTER — Other Ambulatory Visit: Payer: Self-pay | Admitting: Cardiovascular Disease

## 2022-01-13 DIAGNOSIS — Z5181 Encounter for therapeutic drug level monitoring: Secondary | ICD-10-CM

## 2022-01-13 NOTE — Telephone Encounter (Signed)
Prescription refill request received for warfarin Lov: 11/03/21 Barry Decker)  Next INR check: 01/30/22 Warfarin tablet strength: '5mg'$   Appropriate dose and refill sent to requested pharmacy.

## 2022-01-16 ENCOUNTER — Other Ambulatory Visit (INDEPENDENT_AMBULATORY_CARE_PROVIDER_SITE_OTHER): Payer: PPO

## 2022-01-16 DIAGNOSIS — D649 Anemia, unspecified: Secondary | ICD-10-CM

## 2022-01-16 LAB — CBC WITH DIFFERENTIAL/PLATELET
Basophils Absolute: 0 10*3/uL (ref 0.0–0.1)
Basophils Relative: 0.4 % (ref 0.0–3.0)
Eosinophils Absolute: 0.1 10*3/uL (ref 0.0–0.7)
Eosinophils Relative: 3.3 % (ref 0.0–5.0)
HCT: 33.7 % — ABNORMAL LOW (ref 39.0–52.0)
Hemoglobin: 11.4 g/dL — ABNORMAL LOW (ref 13.0–17.0)
Lymphocytes Relative: 30.5 % (ref 12.0–46.0)
Lymphs Abs: 1.3 10*3/uL (ref 0.7–4.0)
MCHC: 33.7 g/dL (ref 30.0–36.0)
MCV: 103.3 fl — ABNORMAL HIGH (ref 78.0–100.0)
Monocytes Absolute: 0.4 10*3/uL (ref 0.1–1.0)
Monocytes Relative: 10.1 % (ref 3.0–12.0)
Neutro Abs: 2.4 10*3/uL (ref 1.4–7.7)
Neutrophils Relative %: 55.7 % (ref 43.0–77.0)
Platelets: 197 10*3/uL (ref 150.0–400.0)
RBC: 3.26 Mil/uL — ABNORMAL LOW (ref 4.22–5.81)
RDW: 15.1 % (ref 11.5–15.5)
WBC: 4.4 10*3/uL (ref 4.0–10.5)

## 2022-01-17 NOTE — Progress Notes (Deleted)
Chronic Care Management Pharmacy Note  01/17/2022 Name:  Barry Decker MRN:  [id number] DOB:  08-20-1932  Summary: Discussed patient lab results from back in October.  He was unaware of recommendation to start Crestor once weekly.  Has upcoming fu visit with you in April.  He is willing to consider statin if you want to go ahead and start him on that once weekly. He is concerned of adverse effects but we discussed benefits and hopefully once weekly will greatly decrease changes.   Subjective: Barry Decker is an 87 y.o. year old male who is a primary patient of Hunter, Brayton Mars, MD.  The CCM team was consulted for assistance with disease management and care coordination needs.    Engaged with patient by telephone for follow up visit in response to provider referral for pharmacy case management and/or care coordination services.   Consent to Services:  The patient was given the following information about Chronic Care Management services today, agreed to services, and gave verbal consent: 1. CCM service includes personalized support from designated clinical staff supervised by the primary care provider, including individualized plan of care and coordination with other care providers 2. 24/7 contact phone numbers for assistance for urgent and routine care needs. 3. Service will only be billed when office clinical staff spend 20 minutes or more in a month to coordinate care. 4. Only one practitioner may furnish and bill the service in a calendar month. 5.The patient may stop CCM services at any time (effective at the end of the month) by phone call to the office staff. 6. The patient will be responsible for cost sharing (co-pay) of up to 20% of the service fee (after annual deductible is met). Patient agreed to services and consent obtained.  Patient Care Team: [name], MD as PCP - General (Family Medicine) [name], MD as PCP - Cardiology (Cardiology) Edythe Clarity, [facility] (Pharmacist)   Objective:  Lab Results  Component Value Date   CREATININE 1.15 12/05/2021   BUN 21 12/05/2021   GFR 57.48 (L) 12/05/2021   GFRNONAA 61 05/11/2020   GFRAA 70 05/11/2020   NA 139 12/05/2021   K 4.7 12/05/2021   CALCIUM 9.3 12/05/2021   CO2 27 12/05/2021   GLUCOSE 121 (H) 12/05/2021    Lab Results  Component Value Date/Time   HGBA1C 5.2 09/21/2017 09:17 AM   GFR 57.48 (L) 12/05/2021 08:20 AM   GFR 48.30 (L) 11/04/2021 09:15 AM    Last diabetic Eye exam: No results found for: "HMDIABEYEEXA"  Last diabetic Foot exam: No results found for: "HMDIABFOOTEX"   Lab Results  Component Value Date   CHOL 197 04/01/2021   HDL 61.50 04/01/2021   LDLCALC 112 (H) 04/01/2021   LDLDIRECT 99.0 11/04/2021   TRIG 119.0 04/01/2021   CHOLHDL 3 04/01/2021       Latest Ref Rng & Units 11/04/2021    9:15 AM 04/01/2021   10:00 AM 09/27/2020   11:34 AM  Hepatic Function  Total Protein 6.0 - 8.3 g/dL 6.8  6.7  6.4   Albumin 3.5 - 5.2 g/dL 3.8  3.8  3.7   AST 0 - 37 U/L 17  17  16    ALT 0 - 53 U/L 20  13  13    Alk Phosphatase 39 - 117 U/L 63  61  100   Total Bilirubin 0.2 - 1.2 mg/dL 0.4  0.5  0.5     Lab Results  Component  Value Date/Time   TSH 0.82 11/11/2013 10:14 AM   TSH 0.76 10/25/2012 10:09 AM       Latest Ref Rng & Units 01/16/2022   10:21 AM 12/05/2021    8:20 AM 11/04/2021    9:15 AM  CBC  WBC 4.0 - 10.5 K/uL 4.4  3.7  3.6   Hemoglobin 13.0 - 17.0 g/dL 11.4  11.3  11.8   Hematocrit 39.0 - 52.0 % 33.7  33.9  34.8   Platelets 150.0 - 400.0 K/uL 197.0  210.0  220.0     No results found for: "VD25OH"  Clinical ASCVD: No  The ASCVD Risk score (Arnett DK, et al., 2019) failed to calculate for the following reasons:   The 2019 ASCVD risk score is only valid for ages 66 to 60       05/27/2021   10:18 AM 04/01/2021    8:53 AM 08/27/2020    1:44 PM  Depression screen PHQ 2/9  Decreased Interest 0 0 0  Down, Depressed, Hopeless 0 0 0  PHQ - 2 Score 0  0 0     Social History   Tobacco Use  Smoking Status Former   Packs/day: 1.00   Years: 20.00   Total pack years: 20.00   Types: Cigarettes   Quit date: 09/15/1980   Years since quitting: 41.3  Smokeless Tobacco Never   BP Readings from Last 3 Encounters:  11/04/21 (!) 102/50  11/03/21 110/62  08/12/21 108/60   Pulse Readings from Last 3 Encounters:  11/04/21 65  11/03/21 68  08/12/21 (!) 57   Wt Readings from Last 3 Encounters:  11/04/21 232 lb (105.2 kg)  11/03/21 230 lb 3.2 oz (104.4 kg)  08/12/21 230 lb (104.3 kg)   BMI Readings from Last 3 Encounters:  11/04/21 31.46 kg/m  11/03/21 31.22 kg/m  08/12/21 31.19 kg/m    Assessment/Interventions: Review of patient past medical history, allergies, medications, health status, including review of consultants reports, laboratory and other test data, was performed as part of comprehensive evaluation and provision of chronic care management services.   SDOH:  (Social Determinants of Health) assessments and interventions performed: Yes  Financial Resource Strain: Low Risk  (05/27/2021)   Overall Financial Resource Strain (CARDIA)    Difficulty of Paying Living Expenses: Not hard at all    SDOH Screenings   Alcohol Screen: Not on file  Depression (PHQ2-9): Low Risk  (05/27/2021)   Depression (PHQ2-9)    PHQ-2 Score: 0  Financial Resource Strain: Low Risk  (05/27/2021)   Overall Financial Resource Strain (CARDIA)    Difficulty of Paying Living Expenses: Not hard at all  Food Insecurity: No Food Insecurity (05/27/2021)   Hunger Vital Sign    Worried About Running Out of Food in the Last Year: Never true    Ran Out of Food in the Last Year: Never true  Housing: Low Risk  (05/27/2021)   Housing    Last Housing Risk Score: 0  Physical Activity: Insufficiently Active (05/27/2021)   Exercise Vital Sign    Days of Exercise per Week: 4 days    Minutes of Exercise per Session: 30 min  Social Connections: Socially Integrated  (05/27/2021)   Social Connection and Isolation Panel [NHANES]    Frequency of Communication with Friends and Family: More than three times a week    Frequency of Social Gatherings with Friends and Family: More than three times a week    Attends Religious Services: More than 4 times per  year    Active Member of Clubs or Organizations: Yes    Attends Archivist Meetings: 1 to 4 times per year    Marital Status: Married  Stress: No Stress Concern Present (05/27/2021)   [place]    Feeling of Stress : Not at all  Tobacco Use: Medium Risk (11/04/2021)   Patient History    Smoking Tobacco Use: Former    Smokeless Tobacco Use: Never    Passive Exposure: Not on file  Transportation Needs: No Transportation Needs (05/27/2021)   PRAPARE - Hydrologist (Medical): No    Lack of Transportation (Non-Medical): No    CCM Care Plan  No Known Allergies  Medications Reviewed Today     Reviewed by Angela Adam, CMA (Certified Medical Assistant) on 11/04/21 at [place] List Status: <None>   Medication Order Taking? Sig Documenting Provider Last Dose Status Informant  allopurinol (ZYLOPRIM) 300 MG tablet [id number]  TAKE 1 TABLET BY MOUTH EVERY DAY [name], MD  Active   amLODipine (NORVASC) 10 MG tablet [id number]  TAKE 1 TABLET BY MOUTH EVERY DAY [name], MD  Active   amoxicillin (AMOXIL) 500 MG tablet [id number]  Take 4 tablets (2,000 mg total) by mouth as directed. 1 hour prior to dental work including cleanings Felipa Evener  Active   cyanocobalamin 1000 MCG tablet [id number]  Take 1,000 mcg by mouth daily. [provider]  Active   diphenhydrAMINE (BENADRYL) 25 mg capsule [id number]  Take 25 mg by mouth at bedtime as needed for sleep. [provider]  Active   furosemide (LASIX) 20 MG tablet [id number]  TAKE 1 TABLET BY MOUTH EVERY DAY [name], MD  Active   hydrochlorothiazide (MICROZIDE) 12.5 MG capsule [id number]  TAKE 1 CAPSULE BY MOUTH EVERY DAY [name], MD  Active   lisinopril (ZESTRIL) 20 MG tablet [id number]  Take 1 tablet (20 mg total) by mouth daily. [name], MD  Active   omeprazole (PRILOSEC) 40 MG capsule [id number]  Take 1 capsule (40 mg total) by mouth daily. [name], MD  Active   potassium citrate (UROCIT-K) 10 MEQ (1080 MG) SR tablet [id number]  Take 1 tablet (10 mEq total) by mouth 3 (three) times daily with meals. Lisabeth Pick, MD  Active Self  quinapril (ACCUPRIL) 40 MG tablet [id number]  TAKE 1 TABLET BY MOUTH EVERY DAY [name], MD  Active   rosuvastatin (CRESTOR) 20 MG tablet [id number]  Take 1 tablet (20 mg total) by mouth once a week. 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 09/25/2017   Kidney stones    Chronic diastolic CHF (congestive heart failure) ([place]) 03/30/2017   Osteoarthritis of cervical spine 08/31/2015   Throat pain in adult 08/31/2015   Osteoarthritis of left knee 05/25/2014   HYPERCHOLESTEROLEMIA  WITH HIGH HDL 01/31/2007   Gout 01/31/2007   Essential hypertension 01/31/2007   History of malignant neoplasm of large intestine 01/31/2007    Immunization History  Administered Date(s) Administered   Fluad Quad(high Dose 65+) 03/05/2020, 04/05/2021   Influenza, High Dose Seasonal PF 03/26/2014, 04/08/2017,  03/20/2018, 02/03/2019, 03/04/2019, 04/05/2021   Influenza,inj,Quad PF,6+ Mos 03/14/2013   Influenza-Unspecified 04/06/2015, 03/02/2016, 02/28/2017, 03/20/2018   PFIZER Comirnaty(Gray Top)Covid-19 Tri-Sucrose Vaccine 11/18/2020   PFIZER(Purple Top)SARS-COV-2 Vaccination 07/09/2019, 07/28/2019, 03/17/2020   Pfizer Covid-19 Vaccine Bivalent Booster 82yr & up 07/05/2021   Pneumococcal Conjugate-13 05/25/2014   Pneumococcal Polysaccharide-23 06/19/2004   Td 12/27/2007   Tdap 05/22/2018   Zoster Recombinat (Shingrix) 06/19/2018, 12/26/2018   Zoster, Live 06/19/2008    Conditions to be addressed/monitored:  HTN, HLD, Aortic Valve Replacement, HF  There are no care plans that you recently modified to display for this patient.      Medication Assistance: None required.  Patient affirms current coverage meets needs.  Compliance/Adherence/Medication fill history: Care Gaps: None at this time  Star-Rating Drugs:  Patient's preferred pharmacy is:  [facility] [place] - [address] [place] [address]Phone: [phone]: [phone] [id number] [address] [place]: [phone]: [phone]  Pt endorses 100% compliance  We discussed: Benefits of medication synchronization, packaging and delivery as well as enhanced pharmacist oversight with [facility]. Patient decided to: Continue current medication management strategy  Care Plan and Follow Up Patient Decision:  Patient agrees to Care Plan and Follow-up.  Plan: The care management team will reach out to the patient again over the next 180 days.  ChBeverly MilchPharmD Clinical Pharmacist  [phone] Current Barriers:  Suboptimal therapeutic regimen for HLD  Pharmacist Clinical Goal(s):  Patient  will achieve control of cholesterol as evidenced by lipid panel contact provider office for questions/concerns as evidenced notation of same in electronic health record through collaboration with PharmD and provider.   Interventions: 1:1 collaboration with HuMarin OlpMD regarding development and update of comprehensive plan of care as evidenced by provider attestation and co-signature Inter-disciplinary care team collaboration (see longitudinal plan of care) Comprehensive medication review performed; medication list updated in electronic medical record  Hypertension  (Status:Goal on track: YES.)   Med Management Intervention: None  (BP goal <130/80) -Controlled -Current treatment: Amlodipine 1091maily HCTZ 12.5mg43mily Quinapril 40mg1mly -Medications previously tried: none noted  -Current home readings: 122/67, 124/68, 128/68, 129/73, 124/67, 126/75 -Current exercise habits: "not as much as he would like" -Denies hypotensive/hypertensive symptoms -Educated on BP goals and benefits of medications for prevention of heart attack, stroke and kidney damage; Exercise goal of 150 minutes per week; Importance of home blood pressure monitoring; Symptoms of hypotension and importance of maintaining adequate hydration; -Counseled to monitor BP at home daily, document, and provide log at future appointments -Recommended to continue current medication  Update 07/25/21 120s/60s BP at home NO symptoms of HTN Denies dizziness or HA No changes needed at this time, continue home monitoring.  Hyperlipidemia: (LDL goal < 70) -Uncontrolled -Current treatment: NONE -Medications previously tried: none noted  -Has previously been offered a statin, but declined and opted to work on lifestyle changes -Educated on Cholesterol goals;  Benefits of statin for ASCVD risk reduction; Importance of limiting foods high in cholesterol; Strategies to  manage statin-induced myalgias; -Recommended to  continue current management - recheck lipids, reconsider statin if still elevated  Update 07/25/21 Most recent LDL is 112, was suggested at that time by PCP to start Crestor 73m once weekly. Patient did not end up starting this medication.  I believe a VM was left on his phone and we never got in touch with him regarding his results. Patient with multiple risk factors would benefit from starting statin. Has follow up visit with PCP in April, is open to starting once weekly if PCP agrees this is best thing to do. Will consult with PCP to see if we want to start Crestor 234monce weekly now or wait until discussion at April OV. Continue to work on limiting fried/fatty foods.  Aortic Valve Replacement (Goal: -) -Controlled -Current treatment  Warfarin 68m58m as directed by clinic -Medications previously tried: none noted -At goal most recently  -He denies any abnormal bleeding, bruising, etc  -Recommended to continue current medication  Patient Goals/Self-Care Activities Patient will:  - take medications as prescribed check blood pressure daily, document, and provide at future appointments target a minimum of 150 minutes of moderate intensity exercise weekly  Follow Up Plan: The care management team will reach out to the patient again over the next 180 days.

## 2022-01-24 ENCOUNTER — Telehealth: Payer: PPO

## 2022-01-30 ENCOUNTER — Ambulatory Visit (INDEPENDENT_AMBULATORY_CARE_PROVIDER_SITE_OTHER): Payer: PPO

## 2022-01-30 DIAGNOSIS — Z5181 Encounter for therapeutic drug level monitoring: Secondary | ICD-10-CM | POA: Diagnosis not present

## 2022-01-30 DIAGNOSIS — Z79899 Other long term (current) drug therapy: Secondary | ICD-10-CM | POA: Diagnosis not present

## 2022-01-30 DIAGNOSIS — I8290 Acute embolism and thrombosis of unspecified vein: Secondary | ICD-10-CM | POA: Diagnosis not present

## 2022-01-30 LAB — POCT INR: INR: 2 (ref 2.0–3.0)

## 2022-01-30 NOTE — Patient Instructions (Signed)
Continue taking Warfarin 1.5 tablets daily except 1 tablet on Fridays. Recheck in 6 weeks. Coumadin Clinic [phone], Main number [phone].   

## 2022-02-04 ENCOUNTER — Other Ambulatory Visit: Payer: Self-pay | Admitting: Family Medicine

## 2022-02-27 ENCOUNTER — Other Ambulatory Visit: Payer: Self-pay | Admitting: Cardiovascular Disease

## 2022-02-27 DIAGNOSIS — Z5181 Encounter for therapeutic drug level monitoring: Secondary | ICD-10-CM

## 2022-02-28 ENCOUNTER — Other Ambulatory Visit: Payer: Self-pay

## 2022-03-13 ENCOUNTER — Ambulatory Visit: Payer: PPO | Attending: Cardiovascular Disease | Admitting: *Deleted

## 2022-03-13 ENCOUNTER — Encounter: Payer: Self-pay | Admitting: *Deleted

## 2022-03-13 DIAGNOSIS — I8290 Acute embolism and thrombosis of unspecified vein: Secondary | ICD-10-CM

## 2022-03-13 DIAGNOSIS — Z5181 Encounter for therapeutic drug level monitoring: Secondary | ICD-10-CM | POA: Diagnosis not present

## 2022-03-13 LAB — POCT INR: INR: 2.3 (ref 2.0–3.0)

## 2022-03-13 NOTE — Patient Instructions (Signed)
Description   Continue taking Warfarin 1.5 tablets daily except 1 tablet on Fridays. Recheck in 6 weeks. Coumadin Clinic [phone], Main number [phone].        

## 2022-03-21 ENCOUNTER — Inpatient Hospital Stay: Payer: PPO | Admitting: Oncology

## 2022-03-21 ENCOUNTER — Inpatient Hospital Stay: Payer: PPO | Attending: Oncology

## 2022-03-21 VITALS — BP 124/62 | HR 78 | Temp 98.2°F | Resp 18 | Ht 72.0 in | Wt 231.0 lb

## 2022-03-21 DIAGNOSIS — D649 Anemia, unspecified: Secondary | ICD-10-CM

## 2022-03-21 DIAGNOSIS — D539 Nutritional anemia, unspecified: Secondary | ICD-10-CM | POA: Insufficient documentation

## 2022-03-21 DIAGNOSIS — I712 Thoracic aortic aneurysm, without rupture, unspecified: Secondary | ICD-10-CM | POA: Insufficient documentation

## 2022-03-21 DIAGNOSIS — I2510 Atherosclerotic heart disease of native coronary artery without angina pectoris: Secondary | ICD-10-CM | POA: Insufficient documentation

## 2022-03-21 DIAGNOSIS — D72818 Other decreased white blood cell count: Secondary | ICD-10-CM | POA: Insufficient documentation

## 2022-03-21 DIAGNOSIS — I10 Essential (primary) hypertension: Secondary | ICD-10-CM | POA: Insufficient documentation

## 2022-03-21 DIAGNOSIS — D7589 Other specified diseases of blood and blood-forming organs: Secondary | ICD-10-CM | POA: Diagnosis not present

## 2022-03-21 DIAGNOSIS — E785 Hyperlipidemia, unspecified: Secondary | ICD-10-CM | POA: Insufficient documentation

## 2022-03-21 DIAGNOSIS — E538 Deficiency of other specified B group vitamins: Secondary | ICD-10-CM | POA: Insufficient documentation

## 2022-03-21 LAB — CBC WITH DIFFERENTIAL (CANCER CENTER ONLY)
Abs Immature Granulocytes: 0.01 10*3/uL (ref 0.00–0.07)
Basophils Absolute: 0 10*3/uL (ref 0.0–0.1)
Basophils Relative: 1 %
Eosinophils Absolute: 0.2 10*3/uL (ref 0.0–0.5)
Eosinophils Relative: 5 %
HCT: 34.9 % — ABNORMAL LOW (ref 39.0–52.0)
Hemoglobin: 11.7 g/dL — ABNORMAL LOW (ref 13.0–17.0)
Immature Granulocytes: 0 %
Lymphocytes Relative: 31 %
Lymphs Abs: 1.3 10*3/uL (ref 0.7–4.0)
MCH: 34.9 pg — ABNORMAL HIGH (ref 26.0–34.0)
MCHC: 33.5 g/dL (ref 30.0–36.0)
MCV: 104.2 fL — ABNORMAL HIGH (ref 80.0–100.0)
Monocytes Absolute: 0.4 10*3/uL (ref 0.1–1.0)
Monocytes Relative: 9 %
Neutro Abs: 2.3 10*3/uL (ref 1.7–7.7)
Neutrophils Relative %: 54 %
Platelet Count: 205 10*3/uL (ref 150–400)
RBC: 3.35 MIL/uL — ABNORMAL LOW (ref 4.22–5.81)
RDW: 15.2 % (ref 11.5–15.5)
WBC Count: 4.2 10*3/uL (ref 4.0–10.5)
nRBC: 0 % (ref 0.0–0.2)

## 2022-03-21 LAB — RETICULOCYTES
Immature Retic Fract: 8.7 % (ref 2.3–15.9)
RBC.: 3.4 MIL/uL — ABNORMAL LOW (ref 4.22–5.81)
Retic Count, Absolute: 45.2 10*3/uL (ref 19.0–186.0)
Retic Ct Pct: 1.3 % (ref 0.4–3.1)

## 2022-03-21 NOTE — Progress Notes (Signed)
  [place] OFFICE PROGRESS NOTE   Diagnosis: Red cell macrocytosis  INTERVAL HISTORY:   Barry Decker returns as scheduled.  He feels well.  Good appetite.  No fever or night sweats.  No complaint.  He has 1 glass of wine each night.  No heavy alcohol use.  Objective:  Vital signs in last 24 hours:  Blood pressure 124/62, pulse 78, temperature 98.2 F (36.8 C), temperature source Oral, resp. rate 18, height 6' (1.829 m), weight 231 lb (104.8 kg), SpO2 98 %.   Lymphatics: No cervical, supraclavicular, axillary, or inguinal nodes Resp: Lungs clear bilaterally Cardio: Regular rate and rhythm, 2/6 systolic murmur GI: No hepatosplenomegaly Vascular: No leg edema  Lab Results:  Lab Results  Component Value Date   WBC 4.2 03/21/2022   HGB 11.7 (L) 03/21/2022   HCT 34.9 (L) 03/21/2022   MCV 104.2 (H) 03/21/2022   PLT 205 03/21/2022   NEUTROABS 2.3 03/21/2022    CMP  Lab Results  Component Value Date   NA 139 12/05/2021   K 4.7 12/05/2021   CL 106 12/05/2021   CO2 27 12/05/2021   GLUCOSE 121 (H) 12/05/2021   BUN 21 12/05/2021   CREATININE 1.15 12/05/2021   CALCIUM 9.3 12/05/2021   PROT 6.8 11/04/2021   ALBUMIN 3.8 11/04/2021   AST 17 11/04/2021   ALT 20 11/04/2021   ALKPHOS 63 11/04/2021   BILITOT 0.4 11/04/2021   GFRNONAA 61 05/11/2020   GFRAA 70 05/11/2020     Medications: I have reviewed the patient's current medications.   Assessment/Plan: Red cell macrocytosis, chronic Mild macrocytic anemia Mild leukopenia on CBC 09/27/2020, normal on 10/18/2020, 06/21/2021 History of B12 deficiency Hypertension Hyperlipidemia Severe AS status post TAVR April 2019 Nonobstructive CAD Thoracic aortic aneurysm     Disposition: Barry Decker has a long history of Red cell macrocytosis.  He now has mild anemia.  The white count has been borderline low on several determinations over the past few years.  We discussed the differential diagnosis including  unrecognized chronic liver disease, alcohol use, and myelodysplasia.  We also discussed the possibility of a lymphoproliferative disorder.  We will plan for a diagnostic bone marrow biopsy if he develops progressive anemia or new neurologic abnormality.  He will return for an office visit in 4 months.  We will check a myeloma panel when he is here in 4 months.  Betsy Coder, MD  03/21/2022  11:18 AM

## 2022-03-22 LAB — FOLATE RBC
Folate, Hemolysate: 298 ng/mL
Folate, RBC: 866 ng/mL (ref >498)
Hematocrit: 34.4 % — ABNORMAL LOW (ref 37.5–51.0)

## 2022-03-29 ENCOUNTER — Other Ambulatory Visit: Payer: Self-pay | Admitting: Family Medicine

## 2022-04-04 ENCOUNTER — Ambulatory Visit (INDEPENDENT_AMBULATORY_CARE_PROVIDER_SITE_OTHER): Payer: PPO | Admitting: Family Medicine

## 2022-04-04 ENCOUNTER — Encounter: Payer: Self-pay | Admitting: Family Medicine

## 2022-04-04 VITALS — BP 108/60 | HR 68 | Temp 97.6°F | Ht 72.0 in | Wt 229.4 lb

## 2022-04-04 DIAGNOSIS — Z23 Encounter for immunization: Secondary | ICD-10-CM | POA: Diagnosis not present

## 2022-04-04 DIAGNOSIS — E7800 Pure hypercholesterolemia, unspecified: Secondary | ICD-10-CM

## 2022-04-04 DIAGNOSIS — E538 Deficiency of other specified B group vitamins: Secondary | ICD-10-CM

## 2022-04-04 DIAGNOSIS — M1A09X0 Idiopathic chronic gout, multiple sites, without tophus (tophi): Secondary | ICD-10-CM | POA: Diagnosis not present

## 2022-04-04 DIAGNOSIS — Z0000 Encounter for general adult medical examination without abnormal findings: Secondary | ICD-10-CM

## 2022-04-04 LAB — LIPID PANEL
Cholesterol: 162 mg/dL (ref 0–200)
HDL: 53.9 mg/dL (ref >39.00)
LDL Cholesterol: 84 mg/dL (ref 0–99)
NonHDL: 107.75
Total CHOL/HDL Ratio: 3
Triglycerides: 121 mg/dL (ref 0.0–149.0)
VLDL: 24.2 mg/dL (ref 0.0–40.0)

## 2022-04-04 LAB — COMPREHENSIVE METABOLIC PANEL WITH GFR
ALT: 16 U/L (ref 0–53)
AST: 15 U/L (ref 0–37)
Albumin: 4 g/dL (ref 3.5–5.2)
Alkaline Phosphatase: 64 U/L (ref 39–117)
BUN: 26 mg/dL — ABNORMAL HIGH (ref 6–23)
CO2: 29 meq/L (ref 19–32)
Calcium: 9.5 mg/dL (ref 8.4–10.5)
Chloride: 106 meq/L (ref 96–112)
Creatinine, Ser: 1.11 mg/dL (ref 0.40–1.50)
GFR: 59.83 mL/min — ABNORMAL LOW
Glucose, Bld: 85 mg/dL (ref 70–99)
Potassium: 4.6 meq/L (ref 3.5–5.1)
Sodium: 140 meq/L (ref 135–145)
Total Bilirubin: 0.5 mg/dL (ref 0.2–1.2)
Total Protein: 6.7 g/dL (ref 6.0–8.3)

## 2022-04-04 LAB — URIC ACID: Uric Acid, Serum: 5.7 mg/dL (ref 4.0–7.8)

## 2022-04-04 LAB — VITAMIN B12: Vitamin B-12: 690 pg/mL (ref 211–911)

## 2022-04-04 NOTE — Progress Notes (Signed)
Phone: [phone]   Subjective:  Patient presents today for their annual physical. Chief complaint-noted.   See problem oriented charting- ROS- full  review of systems was completed and negative  except for: tinnitus, urinary frequency unchanged along with urgency, neck pain- see below  The following were reviewed and entered/updated in epic: Past Medical History:  Diagnosis Date   Aortic atherosclerosis ([place])    Aortic valve disease    a. severe aortic stenosis s/p TAVR 09/2017.   Arthritis    "spinal" (11/16/2015)   Atypical nevus 05/14/2013   moderate atypia - left scapula   Colon cancer ([facility])    precancer-partial colectomy   Dilated aortic root ([facility])    Elevated HDL    First degree AV block    First degree AV block    Gout    Heart murmur    History of kidney stones    Hyperlipidemia    Hypertension    LBBB (left bundle branch block)    Nonobstructive atherosclerosis of coronary artery    Squamous cell skin cancer 08/04/2011   Left outer brow - CX3 + 5FU   Thoracic aortic aneurysm [facility])    Patient Active Problem List   Diagnosis Date Noted   Thrombus 05/31/2020    Priority: High   Ascending aortic aneurysm ([facility]) 03/26/2020    Priority: High   Status post transcatheter aortic valve replacement (TAVR) using bioprosthesis 09/25/2017    Priority: High   Aortic atherosclerosis ([place]) 03/26/2020    Priority: Medium    B12 deficiency 09/23/2019    Priority: Medium    Chronic diastolic CHF (congestive heart failure) ([place]) 03/30/2017    Priority: Medium    HYPERCHOLESTEROLEMIA WITH HIGH HDL 01/31/2007    Priority: Medium    Gout 01/31/2007    Priority: Medium    Essential hypertension 01/31/2007    Priority: Medium    History of malignant neoplasm of large intestine 01/31/2007    Priority: Medium    Osteoarthritis of cervical spine 08/31/2015    Priority: Low   Osteoarthritis of left knee 05/25/2014    Priority: Low   Medication management 12/02/2020    Encounter for therapeutic drug monitoring 05/31/2020   Macrocytic anemia 03/21/2019   Kidney stones    Throat pain in adult 08/31/2015   Past Surgical History:  Procedure Laterality Date   BACK SURGERY     CATARACT EXTRACTION W/ INTRAOCULAR LENS  IMPLANT, BILATERAL Bilateral 2017   COLONOSCOPY W/ POLYPECTOMY     FACET JOINT INJECTION  2009   LITHOTRIPSY  2010   LUMBAR LAMINECTOMY  ~ [place]   precancerours   PARTIAL KNEE ARTHROPLASTY Left 11/16/2015   Procedure: LEFT KNEE UNICOMPARTMENTAL ;  Surgeon: [name], MD;  Location: [facility];  Service: Orthopedics;  Laterality: Left;   REPLACEMENT UNICONDYLAR JOINT KNEE Left 11/16/2015   RIGHT/LEFT HEART CATH AND CORONARY ANGIOGRAPHY N/A 08/10/2017   Procedure: RIGHT/LEFT HEART CATH AND CORONARY ANGIOGRAPHY;  Surgeon: [name], MD;  Location: [facility];  Service: Cardiovascular;  Laterality: N/A;   TEE WITHOUT CARDIOVERSION N/A 09/25/2017   Procedure: TRANSESOPHAGEAL ECHOCARDIOGRAM (TEE);  Surgeon: [name], MD;  Location: [facility];  Service: Open Heart Surgery;  Laterality: N/A;   TONSILLECTOMY     TRANSCATHETER AORTIC VALVE REPLACEMENT, TRANSFEMORAL N/A 09/25/2017   Procedure: TRANSCATHETER AORTIC VALVE REPLACEMENT, TRANSFEMORAL;  Surgeon: [name], MD;  Location: [facility];  Service: Open Heart Surgery;  Laterality:  N/A;   TYMPANOPLASTY Left 1998   ruptured ear drum with skin graft    Family History  Problem Relation Age of Onset   Heart attack Mother 4       former smoker   Heart attack Father 6       former smoker   Hypertension Sister    Heart attack Brother 65       Rheumatic fever    Medications- reviewed and updated Current Outpatient Medications  Medication Sig Dispense Refill   allopurinol (ZYLOPRIM) 300 MG tablet TAKE 1 TABLET BY MOUTH EVERY DAY 90 tablet 3   amLODipine (NORVASC) 10 MG tablet TAKE 1 TABLET BY MOUTH EVERY DAY 90 tablet 3    amoxicillin (AMOXIL) 500 MG capsule TAKE 4 CAPS BY MOUTH AS DIRECTED 1 HOUR PRIOR TO DENTAL WORK INCLUDING CLEANINGS 8 capsule 2   Ascorbic Acid (VITAMIN C) 1000 MG tablet Take 1,000 mg by mouth daily.     cyanocobalamin 1000 MCG tablet Take 1,000 mcg by mouth daily.     furosemide (LASIX) 20 MG tablet TAKE 1 TABLET BY MOUTH EVERY DAY 90 tablet 3   lisinopril (ZESTRIL) 20 MG tablet Take 1 tablet (20 mg total) by mouth daily. 90 tablet 3   omeprazole (PRILOSEC) 40 MG capsule Take 1 capsule (40 mg total) by mouth daily. 90 capsule 3   potassium citrate (UROCIT-K) 10 MEQ (1080 MG) SR tablet Take 1 tablet (10 mEq total) by mouth 3 (three) times daily with meals. 300 tablet 1   rosuvastatin (CRESTOR) 20 MG tablet Take 1 tablet (20 mg total) by mouth once a week. 13 tablet 3   warfarin (COUMADIN) 5 MG tablet TAKE 1 TO 1&1/2 TABLETS ONCE DAILY AS DIRECTED BY ANTICOAGULATION CLINIC 130 tablet 0   No current facility-administered medications for this visit.    Allergies-reviewed and updated No Known Allergies  Social History   Social History Narrative   Married. 5 children. 4 grandkids. 1 greatgrandkid.       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       Hobbies: woodworking      Advised to consider advanced directives/hcpoa-thinks wife may have 69 from years ago.   Objective  Objective:  BP 108/60   Pulse 68   Temp 97.6 F (36.4 C)   Ht 6' (1.829 m)   Wt 229 lb 6.4 oz (104.1 kg)   SpO2 96%   BMI 31.11 kg/m  Gen: NAD, resting comfortably HEENT: Mucous membranes are moist. Oropharynx normal Neck: no thyromegaly CV: RRR no murmurs rubs or gallops Lungs: CTAB no crackles, wheeze, rhonchi Abdomen: soft/nontender/nondistended/normal bowel sounds. No rebound or guarding.  Ext: no edema Skin: warm, dry Neuro: grossly normal, moves all extremities, PERRLA    Assessment and Plan  88 y.o. male presenting for annual physical.  Health Maintenance counseling: 1.  Anticipatory guidance: Patient counseled regarding regular dental exams -q6 months, eye exams -yearly,  avoiding smoking and second hand smoke , limiting alcohol to 2 beverages per day - doesn't drink, no illicit drugs .   2. Risk factor reduction:  Advised patient of need for regular exercise and diet rich and fruits and vegetables to reduce risk of heart attack and stroke.  Exercise- walking 2-3x a week.  Diet/weight management-down 1 lb from last physical  - wife helps him eat a reasonably healthy .  Wt Readings from Last 3 Encounters:  04/04/22 229 lb 6.4 oz (104.1 kg)  03/21/22 231 lb (104.8 kg)  11/04/21 232 lb (105.2 kg)  3. Immunizations/screenings/ancillary studies- flu shot today, not planning on newest covid shot. Prevnar 20 today. Opts out of RSV and covid for now.  Immunization History  Administered Date(s) Administered   Fluad Quad(high Dose 65+) 03/05/2020, 04/05/2021, 04/04/2022   Influenza, High Dose Seasonal PF 03/26/2014, 04/08/2017, 03/20/2018, 02/03/2019, 03/04/2019, 04/05/2021   Influenza,inj,Quad PF,6+ Mos 03/14/2013   Influenza-Unspecified 04/06/2015, 03/02/2016, 02/28/2017, 03/20/2018   PFIZER Comirnaty(Gray Top)Covid-19 Tri-Sucrose Vaccine 11/18/2020   PFIZER(Purple Top)SARS-COV-2 Vaccination 07/09/2019, 07/28/2019, 03/17/2020   Pfizer Covid-19 Vaccine Bivalent Booster 54yr & up 07/05/2021   Pneumococcal Conjugate-13 05/25/2014   Pneumococcal Polysaccharide-23 06/19/2004   Td 12/27/2007   Tdap 05/22/2018   Zoster Recombinat (Shingrix) 06/19/2018, 12/26/2018   Zoster, Live 06/19/2008   4. Prostate cancer screening-  stable nocturia twice a night. Past age based screening recommendations- still sees Dr. WJeffie Pollockfor kidney stones- they do not check prostate either  Lab Results  Component Value Date   PSA 1.36 10/04/2009   PSA 1.19 12/27/2007   PSA 0.80 06/22/2006   5. Colon cancer screening - past age based screening recommndations- partial colectomy for  precancerous polyps in 2011 or so and was told nomore colonoscopies per letter from DR. [name] marh 1, 2018 6. Skin cancer screening- [place] derm Dr. WElvera Lennox recent cyst removal. advised regular sunscreen use. Denies worrisome, changing, or new skin lesions.  7. Smoking associated screening (lung cancer screening, AAA screen 65-75, UA)- former smoker- quit in 1980s. UA followed by urology- no other regular screenings 8. STD screening - only active with wife  Status of chronic or acute concerns   #MSK Worsening neck pain- he did have CT cervical spine on 07/09/20 and there was no acute intracranial process noted but did have "Extensive multilevel cervical spondylosis and facet hypertrophy" - may try heating pad and tylenol arthritis. Cant use nsaids  #Cardiac concerns-follows with Dr. MAngelena Form1.  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  Status post TAVR with bioprosthesis due to severe aortic stenosis 3.  Aortic aneurysm ascending-most recent imaging 11/28/2021 and stable at 4.6 cm with plan for annual follow-up  -Also with abdominal aortic ectasia noted October 2017 but not noted on CT abdomen pelvis 09/03/2017-we have discontinued screening 4.  Chronic diastolic CHF S: Medication: Patient remains on long-term Coumadin/warfarin monitored by cardiology.  Also on Lasix 20 mg daily  Edema- no increased  Weight gain- slightly down  A/P: all issues overall stable- continue lasix and coumadin    #Nonobstructive CAD and aortic atherosclerosis #hyperlipidemia S: Medication:Rosuvastatin 20 mg once a week Symptoms:no chest pain or SOB  Lab Results  Component Value Date   CHOL 197 04/01/2021   HDL 61.50 04/01/2021   LDLCALC 112 (H) 04/01/2021   LDLDIRECT 99.0 11/04/2021   TRIG 119.0 04/01/2021   CHOLHDL 3 04/01/2021   A/P: nonobstructive CAD likely stable- LDL at least under 100 and we will hold off on increasing unless has further issues or if LDL over 100- update labs  today  #hypertension S: medication: Amlodipine 10 mg, Lasix 20 mg, hydrochlorothiazide 12.5 mg, lisinopril 20 mg  -he reports no lightheadedness- wife thinks occasinal Home readings #s: 112/60 most recent BP Readings from Last 3 Encounters:  04/04/22 108/60  03/21/22 124/62  11/04/21 (!) 102/50   A/P: reasonable control- continue current meds- update cmp    #Gout S: Medication: Allopurinol 300 mg . No flares reported Lab Results  Component Value Date   LABURIC 5.3 04/01/2021   A/P:Not  having any flares on current dose-update uric acid with labs today  # B12 deficiency S: Current treatment/medication (oral vs. IM): Has been tolerating oral therapy (required injections in the past)  Lab Results  Component Value Date   [id number] 04/01/2021   A/P: Controlled on last check on oral-update again today  #Constipation-does well on MiraLAX as needed   #Macrocytic anemia-patient with history of B12 deficiency-saw hematology 10/18/2020 with plan for 1 year repeat-follow-up on 03/21/2022 and plan was essentially to monitor and consider bone marrow biopsy if progressive anemia or new neurological abnormality- 4 month follow up planned and appears has multiple myeloma panel planned  Recommended follow up: Return in about 6 months (around 10/04/2022) for followup or sooner if needed.Schedule b4 you leave. Future Appointments  Date Time Provider [place]  04/24/2022 10:00 AM [facility] None  06/09/2022 11:00 AM [facility] HEALTH COACH [facility]  07/20/2022 11:15 AM [facility] PHLEBOTOMIST [facility] None  07/20/2022 11:40 AM Ladell Pier, MD [facility] None   Lab/Order associations:NOT  fasting   ICD-10-CM   1. Preventative health care  Z00.00     2. Need for immunization against influenza  Z23 Flu Vaccine QUAD High Dose(Fluad)    3. HYPERCHOLESTEROLEMIA WITH HIGH HDL  E78.00 Comprehensive metabolic panel    Lipid panel    CANCELED: CBC with  Differential/Platelet    4. B12 deficiency  E53.8 Vitamin B12    5. Idiopathic chronic gout of multiple sites without tophus  M1A.44E3 Uric acid      No orders of the defined types were placed in this encounter.   Return precautions advised.  Garret Reddish, MD

## 2022-04-04 NOTE — Patient Instructions (Addendum)
Please stop by lab before you go If you have mychart- we will send your results within 3 business days of [place] receiving them.  If you do not have mychart- we will call you about results within 5 business days of [place] receiving them.  *please also note that you will see labs on mychart as soon as they post. I will later go in and write notes on them- will say "notes from Dr. Yong Channel"   Prevnar 20 today   Recommended follow up: Return in about 6 months (around 10/04/2022) for followup or sooner if needed.Schedule b4 you leave.

## 2022-04-04 NOTE — Addendum Note (Signed)
Addended by: Clyde Lundborg A on: 04/04/2022 10:57 AM   Modules accepted: Orders

## 2022-04-13 ENCOUNTER — Other Ambulatory Visit: Payer: Self-pay | Admitting: Family Medicine

## 2022-04-24 ENCOUNTER — Ambulatory Visit: Payer: PPO | Attending: Cardiology

## 2022-04-24 DIAGNOSIS — Z5181 Encounter for therapeutic drug level monitoring: Secondary | ICD-10-CM | POA: Diagnosis not present

## 2022-04-24 DIAGNOSIS — I8290 Acute embolism and thrombosis of unspecified vein: Secondary | ICD-10-CM

## 2022-04-24 DIAGNOSIS — N401 Enlarged prostate with lower urinary tract symptoms: Secondary | ICD-10-CM | POA: Diagnosis not present

## 2022-04-24 DIAGNOSIS — R3915 Urgency of urination: Secondary | ICD-10-CM | POA: Diagnosis not present

## 2022-04-24 DIAGNOSIS — R351 Nocturia: Secondary | ICD-10-CM | POA: Diagnosis not present

## 2022-04-24 DIAGNOSIS — N200 Calculus of kidney: Secondary | ICD-10-CM | POA: Diagnosis not present

## 2022-04-24 LAB — POCT INR: INR: 2.4 (ref 2.0–3.0)

## 2022-04-24 NOTE — Patient Instructions (Signed)
Description   Continue taking Warfarin 1.5 tablets daily except 1 tablet on Fridays. Recheck in 6 weeks. [facility] [phone], Main number [phone].

## 2022-06-05 ENCOUNTER — Ambulatory Visit: Payer: PPO | Attending: Cardiovascular Disease

## 2022-06-05 DIAGNOSIS — Z79899 Other long term (current) drug therapy: Secondary | ICD-10-CM

## 2022-06-05 DIAGNOSIS — I8290 Acute embolism and thrombosis of unspecified vein: Secondary | ICD-10-CM | POA: Diagnosis not present

## 2022-06-05 DIAGNOSIS — Z5181 Encounter for therapeutic drug level monitoring: Secondary | ICD-10-CM | POA: Diagnosis not present

## 2022-06-05 LAB — POCT INR: INR: 1.9 — AB (ref 2.0–3.0)

## 2022-06-05 NOTE — Patient Instructions (Signed)
TAKE ANOTHER 0.5 TABLET TODAY ONLY THEN Continue taking Warfarin 1.5 tablets daily except 1 tablet on Fridays. Recheck in 6 weeks. [facility] [phone], Main number [phone].

## 2022-06-27 ENCOUNTER — Ambulatory Visit (INDEPENDENT_AMBULATORY_CARE_PROVIDER_SITE_OTHER): Payer: PPO

## 2022-06-27 VITALS — Wt 229.0 lb

## 2022-06-27 DIAGNOSIS — Z0000 Encounter for general adult medical examination without abnormal findings: Secondary | ICD-10-CM

## 2022-06-27 NOTE — Progress Notes (Signed)
I connected with  [name] on 06/27/22 by a audio enabled telemedicine application and verified that I am speaking with the correct person using two identifiers.  Patient Location: Home  Provider Location: Office/Clinic  I discussed the limitations of evaluation and management by telemedicine. The patient expressed understanding and agreed to proceed.   Subjective:   Barry Decker is a 89 y.o. male who presents for Medicare Annual/Subsequent preventive examination.  Review of Systems     Cardiac Risk Factors include: advanced age (>66mn, >>48women);dyslipidemia;male gender;hypertension;obesity (BMI >30kg/m2)     Objective:    Today's Vitals   06/27/22 1139  Weight: 229 lb (103.9 kg)   Body mass index is 31.06 kg/m.     06/27/2022   11:44 AM 03/21/2022   10:57 AM 05/27/2021   10:19 AM 10/15/2020    3:26 PM 09/27/2017   12:00 AM 09/21/2017    8:54 AM 09/20/2017    8:01 AM  Advanced Directives  Does Patient Have a Medical Advance Directive? Yes Yes Yes Yes Yes Yes Yes  Type of AParamedicof [place]Living will [place]Living will Healthcare Power of APit[place]Living will HBrookingsLiving will HEbroLiving will HIngalls ParkLiving will  Does patient want to make changes to medical advance directive? No - Patient declined No - Patient declined  No - Patient declined No - Patient declined  No - Patient declined  Copy of HClarionin Chart? Yes - validated most recent copy scanned in chart (See row information) Yes - validated most recent copy scanned in chart (See row information) Yes - validated most recent copy scanned in chart (See row information) Yes - validated most recent copy scanned in chart (See row information) Yes Yes No - copy requested    Current Medications (verified) Outpatient Encounter Medications as of 06/27/2022   Medication Sig   allopurinol (ZYLOPRIM) 300 MG tablet TAKE 1 TABLET BY MOUTH EVERY DAY   amLODipine (NORVASC) 10 MG tablet TAKE 1 TABLET BY MOUTH EVERY DAY   Ascorbic Acid (VITAMIN C) 1000 MG tablet Take 1,000 mg by mouth daily.   cyanocobalamin 1000 MCG tablet Take 1,000 mcg by mouth daily.   furosemide (LASIX) 20 MG tablet TAKE 1 TABLET BY MOUTH EVERY DAY   lisinopril (ZESTRIL) 20 MG tablet Take 1 tablet (20 mg total) by mouth daily.   potassium citrate (UROCIT-K) 10 MEQ (1080 MG) SR tablet Take 1 tablet (10 mEq total) by mouth 3 (three) times daily with meals.   rosuvastatin (CRESTOR) 20 MG tablet Take 1 tablet (20 mg total) by mouth once a week.   warfarin (COUMADIN) 5 MG tablet TAKE 1 TO 1&1/2 TABLETS ONCE DAILY AS DIRECTED BY ANTICOAGULATION CLINIC   amoxicillin (AMOXIL) 500 MG capsule TAKE 4 CAPS BY MOUTH AS DIRECTED 1 HOUR PRIOR TO DENTAL WORK INCLUDING CLEANINGS (Patient not taking: Reported on 06/27/2022)   [DISCONTINUED] omeprazole (PRILOSEC) 40 MG capsule Take 1 capsule (40 mg total) by mouth daily.   No facility-administered encounter medications on file as of 06/27/2022.    Allergies (verified) Patient has no known allergies.   History: Past Medical History:  Diagnosis Date   Aortic atherosclerosis ([place]    Aortic valve disease    a. severe aortic stenosis s/p TAVR 09/2017.   Arthritis    "spinal" (11/16/2015)   Atypical nevus 05/14/2013   moderate atypia - left scapula   Colon  cancer [facility])    precancer-partial colectomy   Dilated aortic root (HCC)    Elevated HDL    First degree AV block    First degree AV block    Gout    Heart murmur    History of kidney stones    Hyperlipidemia    Hypertension    LBBB (left bundle branch block)    Nonobstructive atherosclerosis of coronary artery    Squamous cell skin cancer 08/04/2011   Left outer brow - CX3 + 5FU   Thoracic aortic aneurysm [facility])    Past Surgical History:  Procedure Laterality Date   BACK SURGERY      CATARACT EXTRACTION W/ INTRAOCULAR LENS  IMPLANT, BILATERAL Bilateral 2017   COLONOSCOPY W/ POLYPECTOMY     FACET JOINT INJECTION  2009   LITHOTRIPSY  2010   LUMBAR LAMINECTOMY  ~ [place]   precancerours   PARTIAL KNEE ARTHROPLASTY Left 11/16/2015   Procedure: LEFT KNEE UNICOMPARTMENTAL ;  Surgeon: [name], MD;  Location: [place];  Service: Orthopedics;  Laterality: Left;   REPLACEMENT UNICONDYLAR JOINT KNEE Left 11/16/2015   RIGHT/LEFT HEART CATH AND CORONARY ANGIOGRAPHY N/A 08/10/2017   Procedure: RIGHT/LEFT HEART CATH AND CORONARY ANGIOGRAPHY;  Surgeon: [name], MD;  Location: [place] [facility];  Service: Cardiovascular;  Laterality: N/A;   TEE WITHOUT CARDIOVERSION N/A 09/25/2017   Procedure: TRANSESOPHAGEAL ECHOCARDIOGRAM (TEE);  Surgeon: [name], MD;  Location: [place];  Service: Open Heart Surgery;  Laterality: N/A;   TONSILLECTOMY     TRANSCATHETER AORTIC VALVE REPLACEMENT, TRANSFEMORAL N/A 09/25/2017   Procedure: TRANSCATHETER AORTIC VALVE REPLACEMENT, TRANSFEMORAL;  Surgeon: [name], MD;  Location: [place];  Service: Open Heart Surgery;  Laterality: N/A;   TYMPANOPLASTY Left 1998   ruptured ear drum with skin graft   Family History  Problem Relation Age of Onset   Heart attack Mother 81       former smoker   Heart attack Father 66       former smoker   Hypertension Sister    Heart attack Brother 75       Rheumatic fever   Social History   Socioeconomic History   Marital status: Married    Spouse name: Not on file   Number of children: 5   Years of education: Not on file   Highest education level: Not on file  Occupational History   Occupation: Government social research officer  Tobacco Use   Smoking status: Former    Packs/day: 1.00    Years: 20.00    Total pack years: 20.00    Types: Cigarettes    Quit date: 09/15/1980    Years since quitting: 41.8   Smokeless tobacco: Never  Vaping Use   Vaping  Use: Never used  Substance and Sexual Activity   Alcohol use: Yes    Alcohol/week: 7.0 standard drinks of alcohol    Types: 1 Glasses of wine, 1 Shots of liquor, 5 Standard drinks or equivalent per week   Drug use: No   Sexual activity: Never  Other Topics Concern   Not on file  Social History Narrative   Married. 5 children. 4 grandkids. 1 greatgrandkid.       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       Hobbies: woodworking      Advised to consider advanced directives/hcpoa-thinks wife may have 26 from years ago.   Social Determinants of Health  Financial Resource Strain: Low Risk  (06/27/2022)   Overall Financial Resource Strain (CARDIA)    Difficulty of Paying Living Expenses: Not hard at all  Food Insecurity: No Food Insecurity (06/27/2022)   Hunger Vital Sign    Worried About Running Out of Food in the Last Year: Never true    Ran Out of Food in the Last Year: Never true  Transportation Needs: No Transportation Needs (06/27/2022)   PRAPARE - Hydrologist (Medical): No    Lack of Transportation (Non-Medical): No  Physical Activity: Inactive (06/27/2022)   Exercise Vital Sign    Days of Exercise per Week: 0 days    Minutes of Exercise per Session: 0 min  Stress: No Stress Concern Present (06/27/2022)   [place]    Feeling of Stress : Not at all  Social Connections: [place] (06/27/2022)   Social Connection and Isolation Panel [NHANES]    Frequency of Communication with Friends and Family: More than three times a week    Frequency of Social Gatherings with Friends and Family: More than three times a week    Attends Religious Services: More than 4 times per year    Active Member of [employer] or Organizations: Yes    Attends Archivist Meetings: 1 to 4 times per year    Marital Status: Married    Tobacco Counseling Counseling given: Not  Answered   Clinical Intake:  Pre-visit preparation completed: Yes  Pain : No/denies pain     BMI - recorded: 31.06 Nutritional Status: BMI > 30  Obese Nutritional Risks: None Diabetes: No  How often do you need to have someone help you when you read instructions, pamphlets, or other written materials from your doctor or pharmacy?: 1 - Never  Diabetic?no  Interpreter Needed?: No  Information entered by :: Charlott Rakes, LPN   Activities of Daily Living    06/27/2022   11:44 AM 06/22/2022   10:28 AM  In your present state of health, do you have any difficulty performing the following activities:  Hearing? 1 0  Comment uses hearing aids   Vision? 0 0  Difficulty concentrating or making decisions? 0 0  Walking or climbing stairs? 0 1  Dressing or bathing? 0 0  Doing errands, shopping? 0 0  Preparing Food and eating ? N N  Using the Toilet? N N  In the past six months, have you accidently leaked urine? Y Y  Comment at times   Do you have problems with loss of bowel control? N N  Managing your Medications? N N  Managing your Finances? N N  Housekeeping or managing your Housekeeping? N N    Patient Care Team: [name], MD as PCP - General (Family Medicine) [name], MD as PCP - Cardiology (Cardiology) Edythe Clarity, [facility] (Pharmacist)  Indicate any recent Medical Services you may have received from other than [facility] providers in the past year (date may be approximate).     Assessment:   This is a routine wellness examination for [place].  Hearing/Vision screen Hearing Screening - Comments:: Use some hearing aids  Vision Screening - Comments:: Pt follows up with Dr Tommy Rainwater for annual eye exams   Dietary issues and exercise activities discussed: Current Exercise Habits: The patient does not participate in regular exercise at present   Goals Addressed             This Visit's  Progress    Patient Stated       None at this time         Depression Screen    06/27/2022   11:43 AM 05/27/2021   10:18 AM 04/01/2021    8:53 AM 08/27/2020    1:44 PM 03/26/2020   10:40 AM 03/21/2019    9:43 AM 05/21/2018    1:30 PM  PHQ 2/9 Scores  PHQ - 2 Score 0 0 0 0 0 0 0    Fall Risk    06/27/2022   11:44 AM 06/22/2022   10:28 AM 05/27/2021   10:19 AM 04/01/2021    8:53 AM 08/27/2020    1:44 PM  Fall Risk   Falls in the past year? 0 0 0 0 1  Number falls in past yr: 0 0 0 0 0  Injury with Fall? 0 0 0 0 1  Risk for fall due to : Impaired vision  Impaired vision No Fall Risks   Follow up Falls prevention discussed  Falls prevention discussed Falls evaluation completed     FALL RISK PREVENTION PERTAINING TO THE HOME:  Any stairs in or around the home? Yes  If so, are there any without handrails? No  Home free of loose throw rugs in walkways, pet beds, electrical cords, etc? Yes  Adequate lighting in your home to reduce risk of falls? Yes   ASSISTIVE DEVICES UTILIZED TO PREVENT FALLS:  Life alert? No  Use of a cane, walker or w/c? Yes  Grab bars in the bathroom? Yes  Shower chair or bench in shower? Yes  Elevated toilet seat or a handicapped toilet? Yes   TIMED UP AND GO:  Was the test performed? No .   Cognitive Function:        06/27/2022   11:45 AM 05/27/2021   10:21 AM  6CIT Screen  What Year? 0 points 0 points  What month? 0 points 0 points  What time? 0 points 0 points  Count back from 20 0 points 0 points  Months in reverse 0 points 0 points  Repeat phrase 0 points 0 points  Total Score 0 points 0 points    Immunizations Immunization History  Administered Date(s) Administered   Fluad Quad(high Dose 65+) 03/05/2020, 04/05/2021, 04/04/2022   Influenza, High Dose Seasonal PF 03/26/2014, 04/08/2017, 03/20/2018, 02/03/2019, 03/04/2019, 04/05/2021   Influenza,inj,Quad PF,6+ Mos 03/14/2013   Influenza-Unspecified 04/06/2015, 03/02/2016, 02/28/2017, 03/20/2018   PFIZER Comirnaty(Gray Top)Covid-19 Tri-Sucrose  Vaccine 11/18/2020   PFIZER(Purple Top)SARS-COV-2 Vaccination 07/09/2019, 07/28/2019, 03/17/2020   PNEUMOCOCCAL CONJUGATE-20 04/04/2022   Pfizer Covid-19 Vaccine Bivalent Booster 44yr & up 07/05/2021   Pneumococcal Conjugate-13 05/25/2014   Pneumococcal Polysaccharide-23 06/19/2004   Td 12/27/2007   Tdap 05/22/2018   Zoster Recombinat (Shingrix) 06/19/2018, 12/26/2018   Zoster, Live 06/19/2008    TDAP status: Up to date  Flu Vaccine status: Up to date  Pneumococcal vaccine status: Up to date  Covid-19 vaccine status: Completed vaccines  Qualifies for Shingles Vaccine? Yes   Zostavax completed No   Shingrix Completed?: No.    Education has been provided regarding the importance of this vaccine. Patient has been advised to call insurance company to determine out of pocket expense if they have not yet received this vaccine. Advised may also receive vaccine at local pharmacy or Health Dept. Verbalized acceptance and understanding.  Screening Tests Health Maintenance  Topic Date Due   COVID-19 Vaccine (6 - 2023-24 season) 02/17/2022   Medicare Annual Wellness (AWV)  06/28/2023   DTaP/Tdap/Td (3 - Td or Tdap) 05/22/2028   Pneumonia Vaccine 74+ Years old  Completed   INFLUENZA VACCINE  Completed   Zoster Vaccines- Shingrix  Completed   HPV VACCINES  Aged Out    Health Maintenance  Health Maintenance Due  Topic Date Due   COVID-19 Vaccine (6 - 2023-24 season) 02/17/2022    Colorectal cancer screening: No longer required.    Additional Screening:   Vision Screening: Recommended annual ophthalmology exams for early detection of glaucoma and other disorders of the eye. Is the patient up to date with their annual eye exam?  Yes  Who is the provider or what is the name of the office in which the patient attends annual eye exams? Dr Tommy Rainwater  If pt is not established with a provider, would they like to be referred to a provider to establish care? No .   Dental Screening:  Recommended annual dental exams for proper oral hygiene  Community Resource Referral / Chronic Care Management: CRR required this visit?  No   CCM required this visit?  No      Plan:     I have personally reviewed and noted the following in the patient's chart:   Medical and social history Use of alcohol, tobacco or illicit drugs  Current medications and supplements including opioid prescriptions. Patient is not currently taking opioid prescriptions. Functional ability and status Nutritional status Physical activity Advanced directives List of other physicians Hospitalizations, surgeries, and ER visits in previous 12 months Vitals Screenings to include cognitive, depression, and falls Referrals and appointments  In addition, I have reviewed and discussed with patient certain preventive protocols, quality metrics, and best practice recommendations. A written personalized care plan for preventive services as well as general preventive health recommendations were provided to patient.     Willette Brace, LPN   06/26/5629   Nurse Notes: none

## 2022-06-27 NOTE — Patient Instructions (Signed)
[name] , Thank you for taking time to come for your Medicare Wellness Visit. I appreciate your ongoing commitment to your health goals. Please review the following plan we discussed and let me know if I can assist you in the future.   These are the goals we discussed:  Goals      Patient Stated     Should exercise more with walking      Patient Stated     None at this time      [place] (see longitudinal plan of care for additional care plan information)  Current Barriers:  Chronic Disease Management support, education, and care coordination needs related to Hypertension, Hyperlipidemia, and gout.   Hypertension BP Readings from Last 3 Encounters:  05/21/20 (!) 143/76  04/20/20 (!) 142/72  03/26/20 124/80  Pharmacist Clinical Goal(s): Over the next 365 days, patient will work with PharmD and providers to maintain BP goal <140/90 Current regimen:  Hydrochlorothiazide 12.5 mg capsule once daily Amlodipine 10 mg once daily Quinapril 40 mg once daily Interventions: Reviewed home monitoring recommendations, side effects, diet/exercise - Maintain a healthy weight and exercise regularly, as directed by your health care provider. Eat healthy foods, such as: Lean proteins, complex carbohydrates, fresh fruits and vegetables, low-fat dairy products, healthy fats. Patient self care activities - Over the next 365 days, patient will: Check BP at least once every 1-2 weeks, document, and provide at future appointments Ensure daily salt intake < 2300 mg/day  Hyperlipidemia Lab Results  Component Value Date/Time   LDLCALC 108 (H) 03/26/2020 11:28 AM   LDLDIRECT 136.2 10/25/2012 10:09 AM  Pharmacist Clinical Goal(s): Over the next 180 days, patient will work with PharmD and providers to achieve LDL goal < 70 Current regimen:  No medications at this time Interventions: Discussed diet/exercise and counseled on use of rosuvastatin  Patient self care  activities - Over the next 365 days, patient will: Continue current management  Gout Pharmacist Clinical Goal(s) Over the next 365 days, patient will work with PharmD and providers to minimize gout symptoms, provided education on prescriptions and dietary needs as needed Current regimen:  Allopurinol 300 mg once daily Interventions: Reviewed low purine diet - handout provided Patient self care activities - Over the next 365 days, patient will: Continue current management  Medication management Pharmacist Clinical Goal(s): Over the next 365 days, patient will work with PharmD and providers to maintain optimal medication adherence Current pharmacy: [facility] Interventions Comprehensive medication review performed. Continue current medication management strategy Patient self care activities - Over the next 365 days, patient will: Take medications as prescribed Report any questions or concerns to PharmD and/or provider(s) Initial goal documentation.     Track and Manage My Blood Pressure-Hypertension     Timeframe:  Long-Range Goal Priority:  High Start Date: 01/17/21                            Expected End Date:  07/20/21                   Follow Up Date 05/18/21    - check blood pressure daily - choose a place to take my blood pressure (home, clinic or office, retail store) - write blood pressure results in a log or diary    Why is this important?   You won't feel high blood pressure, but it can still hurt your blood vessels.  High blood pressure can cause heart or kidney problems. It can also cause a stroke.  Making lifestyle changes like losing a little weight or eating less salt will help.  Checking your blood pressure at home and at different times of the day can help to control blood pressure.  If the doctor prescribes medicine remember to take it the way the doctor ordered.  Call the office if you cannot afford the medicine or if there are questions about it.     Notes:          This is a list of the screening recommended for you and due dates:  Health Maintenance  Topic Date Due   COVID-19 Vaccine (6 - 2023-24 season) 02/17/2022   Medicare Annual Wellness Visit  06/28/2023   DTaP/Tdap/Td vaccine (3 - Td or Tdap) 05/22/2028   Pneumonia Vaccine  Completed   Flu Shot  Completed   Zoster (Shingles) Vaccine  Completed   HPV Vaccine  Aged Out    Advanced directives: copies in chart   Conditions/risks identified: Stay healthy and active   Next appointment: Follow up in one year for your annual wellness visit.   Preventive Care 52 Years and Older, Male  Preventive care refers to lifestyle choices and visits with your health care provider that can promote health and wellness. What does preventive care include? A yearly physical exam. This is also called an annual well check. Dental exams once or twice a year. Routine eye exams. Ask your health care provider how often you should have your eyes checked. Personal lifestyle choices, including: Daily care of your teeth and gums. Regular physical activity. Eating a healthy diet. Avoiding tobacco and drug use. Limiting alcohol use. Practicing safe sex. Taking low doses of aspirin every day. Taking vitamin and mineral supplements as recommended by your health care provider. What happens during an annual well check? The services and screenings done by your health care provider during your annual well check will depend on your age, overall health, lifestyle risk factors, and family history of disease. Counseling  Your health care provider may ask you questions about your: Alcohol use. Tobacco use. Drug use. Emotional well-being. Home and relationship well-being. Sexual activity. Eating habits. History of falls. Memory and ability to understand (cognition). Work and work Statistician. Screening  You may have the following tests or measurements: Height, weight, and BMI. Blood pressure. Lipid and  cholesterol levels. These may be checked every 5 years, or more frequently if you are over 22 years old. Skin check. Lung cancer screening. You may have this screening every year starting at age 23 if you have a 30-pack-year history of smoking and currently smoke or have quit within the past 15 years. Fecal occult blood test (FOBT) of the stool. You may have this test every year starting at age 72. Flexible sigmoidoscopy or colonoscopy. You may have a sigmoidoscopy every 5 years or a colonoscopy every 10 years starting at age 35. Prostate cancer screening. Recommendations will vary depending on your family history and other risks. Hepatitis C blood test. Hepatitis B blood test. Sexually transmitted disease (STD) testing. Diabetes screening. This is done by checking your blood sugar (glucose) after you have not eaten for a while (fasting). You may have this done every 1-3 years. Abdominal aortic aneurysm (AAA) screening. You may need this if you are a current or former smoker. Osteoporosis. You may be screened starting at age 28 if you are at high risk. Talk with your health care  provider about your test results, treatment options, and if necessary, the need for more tests. Vaccines  Your health care provider may recommend certain vaccines, such as: Influenza vaccine. This is recommended every year. Tetanus, diphtheria, and acellular pertussis (Tdap, Td) vaccine. You may need a Td booster every 10 years. Zoster vaccine. You may need this after age 31. Pneumococcal 13-valent conjugate (PCV13) vaccine. One dose is recommended after age 34. Pneumococcal polysaccharide (PPSV23) vaccine. One dose is recommended after age 8. Talk to your health care provider about which screenings and vaccines you need and how often you need them. This information is not intended to replace advice given to you by your health care provider. Make sure you discuss any questions you have with your health care  provider. Document Released: 07/02/2015 Document Revised: 02/23/2016 Document Reviewed: 04/06/2015 Elsevier Interactive Patient Education  2017 [place] Prevention in the Home Falls can cause injuries. They can happen to people of all ages. There are many things you can do to make your home safe and to help prevent falls. What can I do on the outside of my home? Regularly fix the edges of walkways and driveways and fix any cracks. Remove anything that might make you trip as you walk through a door, such as a raised step or threshold. Trim any bushes or trees on the path to your home. Use bright outdoor lighting. Clear any walking paths of anything that might make someone trip, such as rocks or tools. Regularly check to see if handrails are loose or broken. Make sure that both sides of any steps have handrails. Any raised decks and porches should have guardrails on the edges. Have any leaves, snow, or ice cleared regularly. Use sand or salt on walking paths during winter. Clean up any spills in your garage right away. This includes oil or grease spills. What can I do in the bathroom? Use night lights. Install grab bars by the toilet and in the tub and shower. Do not use towel bars as grab bars. Use non-skid mats or decals in the tub or shower. If you need to sit down in the shower, use a plastic, non-slip stool. Keep the floor dry. Clean up any water that spills on the floor as soon as it happens. Remove soap buildup in the tub or shower regularly. Attach bath mats securely with double-sided non-slip rug tape. Do not have throw rugs and other things on the floor that can make you trip. What can I do in the bedroom? Use night lights. Make sure that you have a light by your bed that is easy to reach. Do not use any sheets or blankets that are too big for your bed. They should not hang down onto the floor. Have a firm chair that has side arms. You can use this for support while  you get dressed. Do not have throw rugs and other things on the floor that can make you trip. What can I do in the kitchen? Clean up any spills right away. Avoid walking on wet floors. Keep items that you use a lot in easy-to-reach places. If you need to reach something above you, use a strong step stool that has a grab bar. Keep electrical cords out of the way. Do not use floor polish or wax that makes floors slippery. If you must use wax, use non-skid floor wax. Do not have throw rugs and other things on the floor that can make you trip. What can I  do with my stairs? Do not leave any items on the stairs. Make sure that there are handrails on both sides of the stairs and use them. Fix handrails that are broken or loose. Make sure that handrails are as long as the stairways. Check any carpeting to make sure that it is firmly attached to the stairs. Fix any carpet that is loose or worn. Avoid having throw rugs at the top or bottom of the stairs. If you do have throw rugs, attach them to the floor with carpet tape. Make sure that you have a light switch at the top of the stairs and the bottom of the stairs. If you do not have them, ask someone to add them for you. What else can I do to help prevent falls? Wear shoes that: Do not have high heels. Have rubber bottoms. Are comfortable and fit you well. Are closed at the toe. Do not wear sandals. If you use a stepladder: Make sure that it is fully opened. Do not climb a closed stepladder. Make sure that both sides of the stepladder are locked into place. Ask someone to hold it for you, if possible. Clearly mark and make sure that you can see: Any grab bars or handrails. First and last steps. Where the edge of each step is. Use tools that help you move around (mobility aids) if they are needed. These include: Canes. Walkers. Scooters. Crutches. Turn on the lights when you go into a dark area. Replace any light bulbs as soon as they burn  out. Set up your furniture so you have a clear path. Avoid moving your furniture around. If any of your floors are uneven, fix them. If there are any pets around you, be aware of where they are. Review your medicines with your doctor. Some medicines can make you feel dizzy. This can increase your chance of falling. Ask your doctor what other things that you can do to help prevent falls. This information is not intended to replace advice given to you by your health care provider. Make sure you discuss any questions you have with your health care provider. Document Released: 04/01/2009 Document Revised: 11/11/2015 Document Reviewed: 07/10/2014 Elsevier Interactive Patient Education  2017 [employer].

## 2022-07-01 ENCOUNTER — Other Ambulatory Visit: Payer: Self-pay | Admitting: Cardiovascular Disease

## 2022-07-01 DIAGNOSIS — Z5181 Encounter for therapeutic drug level monitoring: Secondary | ICD-10-CM

## 2022-07-17 ENCOUNTER — Ambulatory Visit: Payer: PPO | Attending: Cardiovascular Disease

## 2022-07-17 DIAGNOSIS — Z5181 Encounter for therapeutic drug level monitoring: Secondary | ICD-10-CM

## 2022-07-17 DIAGNOSIS — Z79899 Other long term (current) drug therapy: Secondary | ICD-10-CM | POA: Diagnosis not present

## 2022-07-17 DIAGNOSIS — I8290 Acute embolism and thrombosis of unspecified vein: Secondary | ICD-10-CM

## 2022-07-17 LAB — POCT INR: INR: 2.6 (ref 2.0–3.0)

## 2022-07-17 NOTE — Patient Instructions (Signed)
Continue taking Warfarin 1.5 tablets daily except 1 tablet on Fridays. Recheck in 6 weeks. [facility] [phone], Main number [phone].

## 2022-07-20 ENCOUNTER — Inpatient Hospital Stay: Payer: PPO | Attending: Oncology

## 2022-07-20 ENCOUNTER — Inpatient Hospital Stay: Payer: PPO | Admitting: Oncology

## 2022-07-20 VITALS — BP 134/62 | HR 61 | Temp 97.8°F | Resp 18 | Ht 72.0 in | Wt 228.0 lb

## 2022-07-20 DIAGNOSIS — I712 Thoracic aortic aneurysm, without rupture, unspecified: Secondary | ICD-10-CM | POA: Insufficient documentation

## 2022-07-20 DIAGNOSIS — I10 Essential (primary) hypertension: Secondary | ICD-10-CM | POA: Diagnosis not present

## 2022-07-20 DIAGNOSIS — E538 Deficiency of other specified B group vitamins: Secondary | ICD-10-CM | POA: Diagnosis not present

## 2022-07-20 DIAGNOSIS — I2510 Atherosclerotic heart disease of native coronary artery without angina pectoris: Secondary | ICD-10-CM | POA: Diagnosis not present

## 2022-07-20 DIAGNOSIS — D7589 Other specified diseases of blood and blood-forming organs: Secondary | ICD-10-CM | POA: Diagnosis not present

## 2022-07-20 DIAGNOSIS — D539 Nutritional anemia, unspecified: Secondary | ICD-10-CM | POA: Insufficient documentation

## 2022-07-20 DIAGNOSIS — D649 Anemia, unspecified: Secondary | ICD-10-CM

## 2022-07-20 DIAGNOSIS — D72819 Decreased white blood cell count, unspecified: Secondary | ICD-10-CM | POA: Insufficient documentation

## 2022-07-20 DIAGNOSIS — E785 Hyperlipidemia, unspecified: Secondary | ICD-10-CM | POA: Diagnosis not present

## 2022-07-20 LAB — CBC WITH DIFFERENTIAL (CANCER CENTER ONLY)
Abs Immature Granulocytes: 0.01 10*3/uL (ref 0.00–0.07)
Basophils Absolute: 0 10*3/uL (ref 0.0–0.1)
Basophils Relative: 1 %
Eosinophils Absolute: 0.1 10*3/uL (ref 0.0–0.5)
Eosinophils Relative: 3 %
HCT: 38 % — ABNORMAL LOW (ref 39.0–52.0)
Hemoglobin: 12.7 g/dL — ABNORMAL LOW (ref 13.0–17.0)
Immature Granulocytes: 0 %
Lymphocytes Relative: 39 %
Lymphs Abs: 1.5 10*3/uL (ref 0.7–4.0)
MCH: 34.4 pg — ABNORMAL HIGH (ref 26.0–34.0)
MCHC: 33.4 g/dL (ref 30.0–36.0)
MCV: 103 fL — ABNORMAL HIGH (ref 80.0–100.0)
Monocytes Absolute: 0.4 10*3/uL (ref 0.1–1.0)
Monocytes Relative: 11 %
Neutro Abs: 1.8 10*3/uL (ref 1.7–7.7)
Neutrophils Relative %: 46 %
Platelet Count: 209 10*3/uL (ref 150–400)
RBC: 3.69 MIL/uL — ABNORMAL LOW (ref 4.22–5.81)
RDW: 14.6 % (ref 11.5–15.5)
WBC Count: 3.8 10*3/uL — ABNORMAL LOW (ref 4.0–10.5)
nRBC: 0 % (ref 0.0–0.2)

## 2022-07-20 LAB — RETICULOCYTES
Immature Retic Fract: 9.8 % (ref 2.3–15.9)
RBC.: 3.62 MIL/uL — ABNORMAL LOW (ref 4.22–5.81)
Retic Count, Absolute: 38.7 10*3/uL (ref 19.0–186.0)
Retic Ct Pct: 1.1 % (ref 0.4–3.1)

## 2022-07-20 NOTE — Progress Notes (Signed)
  [place] OFFICE PROGRESS NOTE   Diagnosis: Macrocytosis, anemia  INTERVAL HISTORY:   Mr. Hjort returns as scheduled.  He feels well.  No fever or night sweats.  No recent infection.  No pain.  Objective:  Vital signs in last 24 hours:  Blood pressure 134/62, pulse 61, temperature 97.8 F (36.6 C), temperature source Oral, resp. rate 18, height 6' (1.829 m), weight 228 lb (103.4 kg), SpO2 98 %.   Lymphatics: No cervical, supraclavicular, axillary, or inguinal nodes Resp: Lungs clear bilaterally Cardio: Regular rate and rhythm GI: No hepatosplenomegaly Vascular: No leg edema  Lab Results:  Lab Results  Component Value Date   WBC 3.8 (L) 07/20/2022   HGB 12.7 (L) 07/20/2022   HCT 38.0 (L) 07/20/2022   MCV 103.0 (H) 07/20/2022   PLT 209 07/20/2022   NEUTROABS 1.8 07/20/2022    CMP  Lab Results  Component Value Date   NA 140 04/04/2022   K 4.6 04/04/2022   CL 106 04/04/2022   CO2 29 04/04/2022   GLUCOSE 85 04/04/2022   BUN 26 (H) 04/04/2022   CREATININE 1.11 04/04/2022   CALCIUM 9.5 04/04/2022   PROT 6.7 04/04/2022   ALBUMIN 4.0 04/04/2022   AST 15 04/04/2022   ALT 16 04/04/2022   ALKPHOS 64 04/04/2022   BILITOT 0.5 04/04/2022   GFRNONAA 61 05/11/2020   GFRAA 70 05/11/2020    Lab Results  Component Value Date   INR 2.6 07/17/2022   LABPROT 20.9 (H) 07/09/2020    Medications: I have reviewed the patient's current medications.   Assessment/Plan: Red cell macrocytosis, chronic Mild macrocytic anemia Mild leukopenia on CBC 09/27/2020, normal on 10/18/2020, 06/21/2021, 201 2024 History of B12 deficiency Hypertension Hyperlipidemia Severe AS status post TAVR April 2019 Nonobstructive CAD Thoracic aortic aneurysm      Disposition: Mr. Campos has mild macrocytic anemia and mild leukopenia.  The differential diagnosis includes myelodysplasia and chronic liver disease.  We will follow-up on the myeloma panel from today.  We will refer  him for a bone marrow biopsy if he develops progressive anemia, leukopenia, or other hematologic abnormalities.  Mr. Litzau will return for an office and lab visit in 6 months.  Betsy Coder, MD  07/20/2022  11:24 AM

## 2022-07-21 ENCOUNTER — Telehealth: Payer: Self-pay

## 2022-07-21 LAB — KAPPA/LAMBDA LIGHT CHAINS
Kappa free light chain: 193 mg/L — ABNORMAL HIGH (ref 3.3–19.4)
Kappa, lambda light chain ratio: 12.22 — ABNORMAL HIGH (ref 0.26–1.65)
Lambda free light chains: 15.8 mg/L (ref 5.7–26.3)

## 2022-07-21 NOTE — Telephone Encounter (Signed)
I called the patient and let him know his Kappa Light chain result and, this could be underlying multiple myeloma.

## 2022-07-21 NOTE — Telephone Encounter (Signed)
-----   Message from Ladell Pier, MD sent at 07/21/2022  3:03 PM EST ----- Please call patient with the kappa light chains returned elevated, this could indicate underlying multiple myeloma, we are waiting on the remainder of a myeloma panel, we will contact him with these results

## 2022-07-25 LAB — MULTIPLE MYELOMA PANEL, SERUM
Albumin SerPl Elph-Mcnc: 3.7 g/dL (ref 2.9–4.4)
Albumin/Glob SerPl: 1.4 (ref 0.7–1.7)
Alpha 1: 0.2 g/dL (ref 0.0–0.4)
Alpha2 Glob SerPl Elph-Mcnc: 0.5 g/dL (ref 0.4–1.0)
B-Globulin SerPl Elph-Mcnc: 1.2 g/dL (ref 0.7–1.3)
Gamma Glob SerPl Elph-Mcnc: 0.8 g/dL (ref 0.4–1.8)
Globulin, Total: 2.7 g/dL (ref 2.2–3.9)
IgA: 631 mg/dL — ABNORMAL HIGH (ref 61–437)
IgG (Immunoglobin G), Serum: 838 mg/dL (ref 603–1613)
IgM (Immunoglobulin M), Srm: 51 mg/dL (ref 15–143)
M Protein SerPl Elph-Mcnc: 0.5 g/dL — ABNORMAL HIGH
Total Protein ELP: 6.4 g/dL (ref 6.0–8.5)

## 2022-07-26 ENCOUNTER — Other Ambulatory Visit: Payer: Self-pay

## 2022-07-27 ENCOUNTER — Telehealth: Payer: Self-pay | Admitting: *Deleted

## 2022-07-27 NOTE — Telephone Encounter (Signed)
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

## 2022-07-27 NOTE — Telephone Encounter (Signed)
Called Mr. Wetmore with the myeloma panel results and potential for multiple myeloma. He wishes to proceed with bone marrow biopsy w/MD visit 1 week later. MD notified.

## 2022-07-31 ENCOUNTER — Other Ambulatory Visit: Payer: Self-pay | Admitting: Nurse Practitioner

## 2022-07-31 DIAGNOSIS — D72829 Elevated white blood cell count, unspecified: Secondary | ICD-10-CM

## 2022-07-31 DIAGNOSIS — D7589 Other specified diseases of blood and blood-forming organs: Secondary | ICD-10-CM

## 2022-07-31 DIAGNOSIS — D649 Anemia, unspecified: Secondary | ICD-10-CM

## 2022-07-31 DIAGNOSIS — D472 Monoclonal gammopathy: Secondary | ICD-10-CM

## 2022-08-11 ENCOUNTER — Telehealth: Payer: Self-pay | Admitting: Pharmacist

## 2022-08-11 NOTE — Progress Notes (Signed)
Care Management & Coordination Services Pharmacy Team  Reason for Encounter: General adherence update   Contacted patient for general health update and medication adherence call.  Spoke with patient on 08/11/2022    What concerns do you have about your medications?  The patient denies side effects with their medications.   How often do you forget or accidentally miss a dose? Never  Do you use a pillbox? Yes  Are you having any problems getting your medications from your pharmacy? No  Has the cost of your medications been a concern? No If yes, what medication and is patient assistance available or has it been applied for?  Since last visit with PharmD, no interventions have been made.   The patient has not had an ED visit since last contact.   The patient denies problems with their health.   Patient denies concerns or questions for Leata Mouse, PharmD at this time.   Counseled patient on: Great job taking medications and Access to carecoordination team for any cost, medication or pharmacy concerns.   Chart Updates:  Recent office visits:  04/04/2022 [facility] (PCP) [name], MD; no medication changes indicated.  Recent consult visits:  07/20/2022 [facility] (Oncology) Onnie Graham, MD; no medication changes indicated.  03/21/2022 [facility] (Oncology) Onnie Graham, MD; no medication changes indicated.  Hospital visits:  None in previous 6 months  Medications: Outpatient Encounter Medications as of 08/11/2022  Medication Sig   allopurinol (ZYLOPRIM) 300 MG tablet TAKE 1 TABLET BY MOUTH EVERY DAY   amLODipine (NORVASC) 10 MG tablet TAKE 1 TABLET BY MOUTH EVERY DAY   amoxicillin (AMOXIL) 500 MG capsule TAKE 4 CAPS BY MOUTH AS DIRECTED 1 HOUR PRIOR TO DENTAL WORK INCLUDING CLEANINGS (Patient not taking: Reported on 06/27/2022)   Ascorbic Acid (VITAMIN C) 1000 MG tablet Take 1,000 mg by mouth daily.   cyanocobalamin 1000 MCG tablet Take 1,000 mcg by mouth daily.   furosemide  (LASIX) 20 MG tablet TAKE 1 TABLET BY MOUTH EVERY DAY   lisinopril (ZESTRIL) 20 MG tablet Take 1 tablet (20 mg total) by mouth daily.   potassium citrate (UROCIT-K) 10 MEQ (1080 MG) SR tablet Take 1 tablet (10 mEq total) by mouth 3 (three) times daily with meals.   rosuvastatin (CRESTOR) 20 MG tablet Take 1 tablet (20 mg total) by mouth once a week.   warfarin (COUMADIN) 5 MG tablet TAKE 1 TO 1&1/2 TABLETS ONCE DAILY BY MOUTH AS DIRECTED BY ANTICOAGULATION CLINIC   No facility-administered encounter medications on file as of 08/11/2022.    Recent vitals BP Readings from Last 3 Encounters:  07/20/22 134/62  04/04/22 108/60  03/21/22 124/62   Pulse Readings from Last 3 Encounters:  07/20/22 61  04/04/22 68  03/21/22 78   Wt Readings from Last 3 Encounters:  07/20/22 228 lb (103.4 kg)  06/27/22 229 lb (103.9 kg)  04/04/22 229 lb 6.4 oz (104.1 kg)   BMI Readings from Last 3 Encounters:  07/20/22 30.92 kg/m  06/27/22 31.06 kg/m  04/04/22 31.11 kg/m    Recent lab results    Component Value Date/Time   NA 140 04/04/2022 1057   NA 139 11/11/2020 1044   K 4.6 04/04/2022 1057   CL 106 04/04/2022 1057   CO2 29 04/04/2022 1057   GLUCOSE 85 04/04/2022 1057   GLUCOSE 94 06/22/2006 1013   BUN 26 (H) 04/04/2022 1057   BUN 21 11/11/2020 1044   CREATININE 1.11 04/04/2022 1057   CREATININE 0.99 03/26/2020 1128  CALCIUM 9.5 04/04/2022 1057    Lab Results  Component Value Date   CREATININE 1.11 04/04/2022   GFR 59.83 (L) 04/04/2022   EGFR 72 11/11/2020   GFRNONAA 61 05/11/2020   GFRAA 70 05/11/2020   Lab Results  Component Value Date/Time   HGBA1C 5.2 09/21/2017 09:17 AM    Lab Results  Component Value Date   CHOL 162 04/04/2022   HDL 53.90 04/04/2022   LDLCALC 84 04/04/2022   LDLDIRECT 99.0 11/04/2021   TRIG 121.0 04/04/2022   CHOLHDL 3 04/04/2022    Care Gaps: Annual wellness visit in last year? Yes  Star Rating Drugs:  Lisinopril 20 mg last filled 05/26/2022  90 DS Rosuvastatin 20 mg last filled 05/26/2022 90 DS   Future Appointments  Date Time Provider [place]  08/24/2022  9:00 AM [facility] ROOM [facility] None  08/24/2022 10:30 AM [facility] 1 [facility] [place]  08/28/2022 10:15 AM [facility] None  10/31/2022  9:00 AM Edythe Clarity, RPH [facility] None  12/18/2022 11:15 AM [facility] PHLEBOTOMIST [facility] None  12/18/2022 11:40 AM Ladell Pier, MD [facility] None  07/03/2023 11:45 AM [facility] HEALTH COACH [facility]    April D Cal[place], [place] Pharmacist Assistant [phone]

## 2022-08-19 ENCOUNTER — Other Ambulatory Visit: Payer: Self-pay | Admitting: Family Medicine

## 2022-08-23 ENCOUNTER — Other Ambulatory Visit (HOSPITAL_COMMUNITY): Payer: Self-pay | Admitting: Physician Assistant

## 2022-08-23 ENCOUNTER — Other Ambulatory Visit: Payer: Self-pay | Admitting: Radiology

## 2022-08-23 DIAGNOSIS — R799 Abnormal finding of blood chemistry, unspecified: Secondary | ICD-10-CM

## 2022-08-23 NOTE — Consult Note (Signed)
Chief Complaint: Patient was seen in consultation today for image guided bone marrow biopsy  Referring Physician(s): Sherrill,B  Supervising Physician: Daryll Brod  Patient Status: [facility] - Out-pt  History of Present Illness: Barry Decker is an 89 y.o. male with past medical history of aortic valve disease and prior TAVR,  colon (pre)cancer with prior partial colectomy, hyperlipidemia, gout, nephrolithiasis, hypertension, left bundle branch block, coronary artery disease, skin cancer, thoracic aortic aneurysm, vitamin B12 deficiency who presents now with chronic red cell macrocytosis, mild macrocytic anemia, mild leukopenia and abnormal SPEP (IgA monoclonal protein with kappa light chain specificity and positive M spike) concerning for multiple myeloma.  He is scheduled today for image guided bone marrow biopsy for further evaluation.  Past Medical History:  Diagnosis Date   Aortic atherosclerosis ([place])    Aortic valve disease    a. severe aortic stenosis s/p TAVR 09/2017.   Arthritis    "spinal" (11/16/2015)   Atypical nevus 05/14/2013   moderate atypia - left scapula   Colon cancer (HCC)    precancer-partial colectomy   Dilated aortic root (HCC)    Elevated HDL    First degree AV block    First degree AV block    Gout    Heart murmur    History of kidney stones    Hyperlipidemia    Hypertension    LBBB (left bundle branch block)    Nonobstructive atherosclerosis of coronary artery    Squamous cell skin cancer 08/04/2011   Left outer brow - CX3 + 5FU   Thoracic aortic aneurysm [facility])     Past Surgical History:  Procedure Laterality Date   BACK SURGERY     CATARACT EXTRACTION W/ INTRAOCULAR LENS  IMPLANT, BILATERAL Bilateral 2017   COLONOSCOPY W/ POLYPECTOMY     FACET JOINT INJECTION  2009   LITHOTRIPSY  2010   LUMBAR LAMINECTOMY  ~ [place]   precancerours   PARTIAL KNEE ARTHROPLASTY Left 11/16/2015   Procedure: LEFT KNEE UNICOMPARTMENTAL  ;  Surgeon: [name], MD;  Location: [place];  Service: Orthopedics;  Laterality: Left;   REPLACEMENT UNICONDYLAR JOINT KNEE Left 11/16/2015   RIGHT/LEFT HEART CATH AND CORONARY ANGIOGRAPHY N/A 08/10/2017   Procedure: RIGHT/LEFT HEART CATH AND CORONARY ANGIOGRAPHY;  Surgeon: [name], MD;  Location: [place] [facility];  Service: Cardiovascular;  Laterality: N/A;   TEE WITHOUT CARDIOVERSION N/A 09/25/2017   Procedure: TRANSESOPHAGEAL ECHOCARDIOGRAM (TEE);  Surgeon: [name], MD;  Location: [place];  Service: Open Heart Surgery;  Laterality: N/A;   TONSILLECTOMY     TRANSCATHETER AORTIC VALVE REPLACEMENT, TRANSFEMORAL N/A 09/25/2017   Procedure: TRANSCATHETER AORTIC VALVE REPLACEMENT, TRANSFEMORAL;  Surgeon: [name], MD;  Location: [place];  Service: Open Heart Surgery;  Laterality: N/A;   TYMPANOPLASTY Left 1998   ruptured ear drum with skin graft    Allergies: Patient has no known allergies.  Medications: Prior to Admission medications   Medication Sig Start Date End Date Taking? Authorizing Provider  allopurinol (ZYLOPRIM) 300 MG tablet TAKE 1 TABLET BY MOUTH EVERY DAY 04/14/22   [name], MD  amLODipine (NORVASC) 10 MG tablet TAKE 1 TABLET BY MOUTH EVERY DAY 02/07/22   [name], MD  amoxicillin (AMOXIL) 500 MG capsule TAKE 4 CAPS BY MOUTH AS DIRECTED 1 HOUR PRIOR TO DENTAL WORK INCLUDING CLEANINGS Patient not taking: Reported on 06/27/2022 11/16/21   [name], MD  Ascorbic Acid (VITAMIN  C) 1000 MG tablet Take 1,000 mg by mouth daily.    [provider]  cyanocobalamin 1000 MCG tablet Take 1,000 mcg by mouth daily.    [provider]  furosemide (LASIX) 20 MG tablet TAKE 1 TABLET BY MOUTH EVERY DAY 02/28/22   [name], MD  lisinopril (ZESTRIL) 20 MG tablet Take 1 tablet (20 mg total) by mouth daily. 11/04/21   [name], MD  potassium citrate (UROCIT-K) 10 MEQ (1080 MG) SR tablet  Take 1 tablet (10 mEq total) by mouth 3 (three) times daily with meals. 07/04/13   Swords, Darrick Penna, MD  rosuvastatin (CRESTOR) 20 MG tablet TAKE 1 TABLET BY MOUTH WEEKLY 08/21/22   [name], MD  warfarin (COUMADIN) 5 MG tablet TAKE 1 TO 1&1/2 TABLETS ONCE DAILY BY MOUTH AS DIRECTED BY ANTICOAGULATION CLINIC 07/03/22   [name], MD     Family History  Problem Relation Age of Onset   Heart attack Mother 27       former smoker   Heart attack Father 70       former smoker   Hypertension Sister    Heart attack Brother 34       Rheumatic fever    Social History   Socioeconomic History   Marital status: Married    Spouse name: Not on file   Number of children: 5   Years of education: Not on file   Highest education level: Not on file  Occupational History   Occupation: Government social research officer  Tobacco Use   Smoking status: Former    Packs/day: 1.00    Years: 20.00    Total pack years: 20.00    Types: Cigarettes    Quit date: 09/15/1980    Years since quitting: 41.9   Smokeless tobacco: Never  Vaping Use   Vaping Use: Never used  Substance and Sexual Activity   Alcohol use: Yes    Alcohol/week: 7.0 standard drinks of alcohol    Types: 1 Glasses of wine, 1 Shots of liquor, 5 Standard drinks or equivalent per week   Drug use: No   Sexual activity: Never  Other Topics Concern   Not on file  Social History Narrative   Married. 5 children. 4 grandkids. 1 greatgrandkid.       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       Hobbies: woodworking      Advised to consider advanced directives/hcpoa-thinks wife may have 60 from years ago.   Social Determinants of Health   Financial Resource Strain: Low Risk  (06/27/2022)   Overall Financial Resource Strain (CARDIA)    Difficulty of Paying Living Expenses: Not hard at all  Food Insecurity: No Food Insecurity (06/27/2022)   Hunger Vital Sign    Worried About Running Out of Food in the Last  Year: Never true    Ran Out of Food in the Last Year: Never true  Transportation Needs: No Transportation Needs (06/27/2022)   PRAPARE - Hydrologist (Medical): No    Lack of Transportation (Non-Medical): No  Physical Activity: Inactive (06/27/2022)   Exercise Vital Sign    Days of Exercise per Week: 0 days    Minutes of Exercise per Session: 0 min  Stress: No Stress Concern Present (06/27/2022)   [place]    Feeling of Stress : Not at all  Social Connections: [place] (06/27/2022)   Social Connection  and Isolation Panel [NHANES]    Frequency of Communication with Friends and Family: More than three times a week    Frequency of Social Gatherings with Friends and Family: More than three times a week    Attends Religious Services: More than 4 times per year    Active Member of [employer] or Organizations: Yes    Attends Archivist Meetings: 1 to 4 times per year    Marital Status: Married      Review of Systems  Vital Signs:   Code Status:    Physical Exam  Imaging: No results found.  Labs:  CBC: Recent Labs    12/05/21 0820 01/16/22 1021 03/21/22 1002 07/20/22 1105  WBC 3.7* 4.4 4.2 3.8*  HGB 11.3* 11.4* 11.7* 12.7*  HCT 33.9* 33.7* 34.9*  34.4* 38.0*  PLT 210.0 197.0 205 209    COAGS: Recent Labs    03/13/22 1005 04/24/22 0951 06/05/22 0943 07/17/22 0952  INR 2.3 2.4 1.9* 2.6    BMP: Recent Labs    11/04/21 0915 12/05/21 0820 04/04/22 1057  NA 139 139 140  K 4.5 4.7 4.6  CL 104 106 106  CO2 '29 27 29  '$ GLUCOSE 90 121* 85  BUN 30* 21 26*  CALCIUM 9.5 9.3 9.5  CREATININE 1.33 1.15 1.11    LIVER FUNCTION TESTS: Recent Labs    11/04/21 0915 04/04/22 1057  BILITOT 0.4 0.5  AST 17 15  ALT 20 16  ALKPHOS 63 64  PROT 6.8 6.7  ALBUMIN 3.8 4.0    TUMOR MARKERS: No results for input(s): "AFPTM", "CEA", "CA199", "CHROMGRNA" in the last  8760 hours.  Assessment and Plan: 89 y.o. male with past medical history of aortic valve disease and prior TAVR,  colon (pre)cancer with prior partial colectomy, hyperlipidemia, gout, nephrolithiasis, hypertension, left bundle branch block, coronary artery disease, skin cancer, thoracic aortic aneurysm, vitamin B12 deficiency who presents now with chronic red cell macrocytosis, mild macrocytic anemia, mild leukopenia and abnormal SPEP (IgA monoclonal protein with kappa light chain specificity and positive M spike) concerning for multiple myeloma.  He is scheduled today for image guided bone marrow biopsy for further evaluation.Risks and benefits of procedure was discussed with the patient including, but not limited to bleeding, infection, damage to adjacent structures or low yield requiring additional tests.  All of the questions were answered and there is agreement to proceed.  Consent signed and in chart.    Thank you for this interesting consult.  I greatly enjoyed meeting Barry Decker and look forward to participating in their care.  A copy of this report was sent to the requesting provider on this date.  Electronically Signed: D. Rowe Robert, PA-C 08/23/2022, 1:16 PM   I spent a total of  20 minutes   in face to face in clinical consultation, greater than 50% of which was counseling/coordinating care for image guided bone marrow biopsy

## 2022-08-24 ENCOUNTER — Ambulatory Visit (HOSPITAL_COMMUNITY)
Admission: RE | Admit: 2022-08-24 | Discharge: 2022-08-24 | Disposition: A | Discharge status: Home / Self Care | Payer: PPO | Source: Ambulatory Visit | Attending: Nurse Practitioner | Admitting: Nurse Practitioner

## 2022-08-24 ENCOUNTER — Encounter (HOSPITAL_COMMUNITY): Payer: Self-pay

## 2022-08-24 DIAGNOSIS — D649 Anemia, unspecified: Secondary | ICD-10-CM | POA: Diagnosis not present

## 2022-08-24 DIAGNOSIS — D472 Monoclonal gammopathy: Secondary | ICD-10-CM | POA: Diagnosis not present

## 2022-08-24 DIAGNOSIS — R799 Abnormal finding of blood chemistry, unspecified: Secondary | ICD-10-CM

## 2022-08-24 DIAGNOSIS — Z87891 Personal history of nicotine dependence: Secondary | ICD-10-CM | POA: Diagnosis not present

## 2022-08-24 DIAGNOSIS — D539 Nutritional anemia, unspecified: Secondary | ICD-10-CM | POA: Diagnosis not present

## 2022-08-24 DIAGNOSIS — I447 Left bundle-branch block, unspecified: Secondary | ICD-10-CM | POA: Insufficient documentation

## 2022-08-24 DIAGNOSIS — D72819 Decreased white blood cell count, unspecified: Secondary | ICD-10-CM | POA: Diagnosis not present

## 2022-08-24 DIAGNOSIS — M109 Gout, unspecified: Secondary | ICD-10-CM | POA: Diagnosis not present

## 2022-08-24 DIAGNOSIS — D509 Iron deficiency anemia, unspecified: Secondary | ICD-10-CM | POA: Diagnosis not present

## 2022-08-24 DIAGNOSIS — I712 Thoracic aortic aneurysm, without rupture, unspecified: Secondary | ICD-10-CM | POA: Diagnosis not present

## 2022-08-24 DIAGNOSIS — I2510 Atherosclerotic heart disease of native coronary artery without angina pectoris: Secondary | ICD-10-CM | POA: Diagnosis not present

## 2022-08-24 DIAGNOSIS — D72829 Elevated white blood cell count, unspecified: Secondary | ICD-10-CM | POA: Diagnosis not present

## 2022-08-24 DIAGNOSIS — D7589 Other specified diseases of blood and blood-forming organs: Secondary | ICD-10-CM | POA: Diagnosis not present

## 2022-08-24 DIAGNOSIS — Z1379 Encounter for other screening for genetic and chromosomal anomalies: Secondary | ICD-10-CM | POA: Insufficient documentation

## 2022-08-24 DIAGNOSIS — E538 Deficiency of other specified B group vitamins: Secondary | ICD-10-CM | POA: Insufficient documentation

## 2022-08-24 DIAGNOSIS — I10 Essential (primary) hypertension: Secondary | ICD-10-CM | POA: Diagnosis not present

## 2022-08-24 HISTORY — PX: IR BONE MARROW BIOPSY & ASPIRATION: IMG5727

## 2022-08-24 LAB — CBC WITH DIFFERENTIAL/PLATELET
Abs Immature Granulocytes: 0 10*3/uL (ref 0.00–0.07)
Basophils Absolute: 0 10*3/uL (ref 0.0–0.1)
Basophils Relative: 1 %
Eosinophils Absolute: 0.1 10*3/uL (ref 0.0–0.5)
Eosinophils Relative: 4 %
HCT: 34 % — ABNORMAL LOW (ref 39.0–52.0)
Hemoglobin: 11.3 g/dL — ABNORMAL LOW (ref 13.0–17.0)
Immature Granulocytes: 0 %
Lymphocytes Relative: 36 %
Lymphs Abs: 1.2 10*3/uL (ref 0.7–4.0)
MCH: 34.6 pg — ABNORMAL HIGH (ref 26.0–34.0)
MCHC: 33.2 g/dL (ref 30.0–36.0)
MCV: 104 fL — ABNORMAL HIGH (ref 80.0–100.0)
Monocytes Absolute: 0.3 10*3/uL (ref 0.1–1.0)
Monocytes Relative: 9 %
Neutro Abs: 1.6 10*3/uL — ABNORMAL LOW (ref 1.7–7.7)
Neutrophils Relative %: 50 %
Platelets: 184 10*3/uL (ref 150–400)
RBC: 3.27 MIL/uL — ABNORMAL LOW (ref 4.22–5.81)
RDW: 14.6 % (ref 11.5–15.5)
WBC: 3.2 10*3/uL — ABNORMAL LOW (ref 4.0–10.5)
nRBC: 0 % (ref 0.0–0.2)

## 2022-08-24 MED ORDER — FENTANYL CITRATE (PF) 100 MCG/2ML IJ SOLN
INTRAMUSCULAR | Status: AC | PRN
  Administered 2022-08-24 (×2): 50 ug via INTRAVENOUS

## 2022-08-24 MED ORDER — SODIUM CHLORIDE 0.9 % IV SOLN: INTRAVENOUS | Status: DC

## 2022-08-24 MED ORDER — MIDAZOLAM HCL 2 MG/2ML IJ SOLN
INTRAMUSCULAR | Status: AC
  Filled 2022-08-24: qty 2

## 2022-08-24 MED ORDER — FENTANYL CITRATE (PF) 100 MCG/2ML IJ SOLN
INTRAMUSCULAR | Status: AC
  Filled 2022-08-24: qty 2

## 2022-08-24 MED ORDER — MIDAZOLAM HCL 2 MG/2ML IJ SOLN
INTRAMUSCULAR | Status: AC | PRN
  Administered 2022-08-24: .5 mg via INTRAVENOUS
  Administered 2022-08-24: 1.5 mg via INTRAVENOUS

## 2022-08-24 MED ORDER — LIDOCAINE HCL (PF) 1 % IJ SOLN
INTRAMUSCULAR | Status: AC
  Administered 2022-08-24: 10 mL via INTRADERMAL
  Filled 2022-08-24: qty 30

## 2022-08-24 NOTE — Discharge Instructions (Addendum)
Bone Marrow Aspiration and Bone Marrow Biopsy, Adult, Care After  The following information offers guidance on how to care for yourself after your procedure. Your health care provider may also give you more specific instructions. If you have problems or questions, contact your health care provider.  What can I expect after the procedure?  May remove dressing or bandaid and shower tomorrow.  Keep site clean and dry. Replace with clean dressing or bandaid as necessary. Urgent needs IR clinic [phone] (mon-fri 8-5).  After the procedure, it is common to have: Mild pain and tenderness. Swelling. Bruising. Follow these instructions at home: Incision care  Follow instructions from your health care provider about how to take care of the incision site. Make sure you: Wash your hands with soap and water for at least 20 seconds before and after you change your bandage (dressing). If soap and water are not available, use hand sanitizer. Change your dressing as told by your health care provider. Leave stitches (sutures), skin glue, or adhesive strips in place. These skin closures may need to stay in place for 2 weeks or longer. If adhesive strip edges start to loosen and curl up, you may trim the loose edges. Do not remove adhesive strips completely unless your health care provider tells you to do that. Check your incision site every day for signs of infection. Check for: More redness, swelling, or pain. Fluid or blood. Warmth. Pus or a bad smell. Activity Return to your normal activities as told by your health care provider. Ask your health care provider what activities are safe for you. Do not lift anything that is heavier than 10 lb (4.5 kg), or the limit that you are told, until your health care provider says that it is safe. If you were given a sedative during the procedure, it can affect you for  several hours. Do not drive or operate machinery until your health care provider says that it is safe. General instructions  Take over-the-counter and prescription medicines only as told by your health care provider. Do not take baths, swim, or use a hot tub until your health care provider approves. Ask your health care provider if you may take showers. You may only be allowed to take sponge baths. If directed, put ice on the affected area. To do this: Put ice in a plastic bag. Place a towel between your skin and the bag. Leave the ice on for 20 minutes, 2-3 times a day. If your skin turns bright red, remove the ice right away to prevent skin damage. The risk of skin damage is higher if you cannot feel pain, heat, or cold. Contact a health care provider if: You have signs of infection. Your pain is not controlled with medicine. You have cancer, and a temperature of 100.68F (38C) or higher. Get help right away if: You have a temperature of 101F (38.3C) or higher, or as told by your health care provider. You have bleeding from the incision site that cannot be controlled. This information is not intended to replace advice given to you by your health care provider. Make sure you discuss any questions you have with your health care provider. Document Revised: 10/10/2021 Document Reviewed: 10/10/2021 Elsevier Patient Education  [place].                            Moderate Conscious Sedation, Adult, Care After  This sheet gives you information about how to care  for yourself after your procedure. Your health care provider may also give you more specific instructions. If you have problems or questions, contact your health care provider. What can I expect after the procedure? After the procedure, it is common to have: Sleepiness for several hours. Impaired judgment for several hours. Difficulty with balance. Vomiting if you eat too soon. Follow these instructions at home: For  the time period you were told by your health care provider:     Rest. Do not participate in activities where you could fall or become injured. Do not drive or use machinery. Do not drink alcohol. Do not take sleeping pills or medicines that cause drowsiness. Do not make important decisions or sign legal documents. Do not take care of children on your own. Eating and drinking  Follow the diet recommended by your health care provider. Drink enough fluid to keep your urine pale yellow. If you vomit: Drink water, juice, or soup when you can drink without vomiting. Make sure you have little or no nausea before eating solid foods. General instructions Take over-the-counter and prescription medicines only as told by your health care provider. Have a responsible adult stay with you for the time you are told. It is important to have someone help care for you until you are awake and alert. Do not smoke. Keep all follow-up visits as told by your health care provider. This is important. Contact a health care provider if: You are still sleepy or having trouble with balance after 24 hours. You feel light-headed. You keep feeling nauseous or you keep vomiting. You develop a rash. You have a fever. You have redness or swelling around the IV site. Get help right away if: You have trouble breathing. You have new-onset confusion at home. Summary After the procedure, it is common to feel sleepy, have impaired judgment, or feel nauseous if you eat too soon. Rest after you get home. Know the things you should not do after the procedure. Follow the diet recommended by your health care provider and drink enough fluid to keep your urine pale yellow. Get help right away if you have trouble breathing or new-onset confusion at home. This information is not intended to replace advice given to you by your health care provider. Make sure you discuss any questions you have with your health care  provider. Document Revised: 10/03/2019 Document Reviewed: 05/01/2019 Elsevier Patient Education  [place].

## 2022-08-24 NOTE — Procedures (Signed)
Interventional Radiology Procedure Note  Procedure: IR FLUORO BONE MARROW ASP AND CORE    Complications: None  Estimated Blood Loss:  MIN  Findings: 11 G CORE AND ASP    M. Daryll Brod, MD

## 2022-08-28 ENCOUNTER — Ambulatory Visit: Payer: PPO | Attending: Internal Medicine

## 2022-08-28 DIAGNOSIS — Z5181 Encounter for therapeutic drug level monitoring: Secondary | ICD-10-CM

## 2022-08-28 DIAGNOSIS — I8290 Acute embolism and thrombosis of unspecified vein: Secondary | ICD-10-CM | POA: Diagnosis not present

## 2022-08-28 LAB — POCT INR: INR: 4.2 — AB (ref 2.0–3.0)

## 2022-08-28 LAB — SURGICAL PATHOLOGY

## 2022-08-28 NOTE — Patient Instructions (Signed)
Description   HOLD today's dose and then continue taking Warfarin 1.5 tablets daily except 1 tablet on Fridays.  Recheck in 4 weeks.  Coumadin Clinic [phone], Main number [phone].

## 2022-08-30 ENCOUNTER — Telehealth: Payer: Self-pay

## 2022-08-30 NOTE — Telephone Encounter (Signed)
Patient called and request a result on his bone marrow biopsy. I placed a copy of the result on Dr. Gearldine Shown desk

## 2022-09-01 ENCOUNTER — Other Ambulatory Visit: Payer: Self-pay

## 2022-09-01 ENCOUNTER — Telehealth: Payer: Self-pay

## 2022-09-01 DIAGNOSIS — C9000 Multiple myeloma not having achieved remission: Secondary | ICD-10-CM

## 2022-09-01 NOTE — Telephone Encounter (Signed)
-----   Message from Ladell Pier, MD sent at 09/01/2022  1:43 PM EDT ----- Please call patient, bone marrow biopsy is consistent with a diagnosis of multiple myeloma, please schedule with Lattie Haw in the p.m. on 09/06/2022 to discuss treatment options  CBC, CMP, IgA, light chains, beta-2 microglobulin, and SPEP 09/06/2022

## 2022-09-01 NOTE — Telephone Encounter (Signed)
Patient gave verbal understanding and had no further questions or concerns. He is schedule on the 09/06/22 with Lattie Haw K. Marcello Moores

## 2022-09-04 ENCOUNTER — Encounter (HOSPITAL_COMMUNITY): Payer: Self-pay | Admitting: Oncology

## 2022-09-06 ENCOUNTER — Inpatient Hospital Stay (HOSPITAL_BASED_OUTPATIENT_CLINIC_OR_DEPARTMENT_OTHER): Payer: PPO | Admitting: Nurse Practitioner

## 2022-09-06 ENCOUNTER — Ambulatory Visit (HOSPITAL_BASED_OUTPATIENT_CLINIC_OR_DEPARTMENT_OTHER)
Admission: RE | Admit: 2022-09-06 | Discharge: 2022-09-06 | Disposition: A | Discharge status: Home / Self Care | Payer: PPO | Source: Ambulatory Visit | Attending: Nurse Practitioner | Admitting: Nurse Practitioner

## 2022-09-06 ENCOUNTER — Inpatient Hospital Stay: Payer: PPO | Attending: Oncology

## 2022-09-06 ENCOUNTER — Encounter: Payer: Self-pay | Admitting: Nurse Practitioner

## 2022-09-06 VITALS — BP 124/60 | HR 60 | Temp 98.2°F | Resp 18 | Ht 72.0 in | Wt 233.0 lb

## 2022-09-06 DIAGNOSIS — E785 Hyperlipidemia, unspecified: Secondary | ICD-10-CM | POA: Diagnosis not present

## 2022-09-06 DIAGNOSIS — C9000 Multiple myeloma not having achieved remission: Secondary | ICD-10-CM

## 2022-09-06 DIAGNOSIS — I10 Essential (primary) hypertension: Secondary | ICD-10-CM | POA: Diagnosis not present

## 2022-09-06 DIAGNOSIS — Z952 Presence of prosthetic heart valve: Secondary | ICD-10-CM | POA: Diagnosis not present

## 2022-09-06 DIAGNOSIS — M542 Cervicalgia: Secondary | ICD-10-CM | POA: Insufficient documentation

## 2022-09-06 DIAGNOSIS — I712 Thoracic aortic aneurysm, without rupture, unspecified: Secondary | ICD-10-CM | POA: Insufficient documentation

## 2022-09-06 DIAGNOSIS — D539 Nutritional anemia, unspecified: Secondary | ICD-10-CM | POA: Insufficient documentation

## 2022-09-06 DIAGNOSIS — I2510 Atherosclerotic heart disease of native coronary artery without angina pectoris: Secondary | ICD-10-CM | POA: Insufficient documentation

## 2022-09-06 DIAGNOSIS — D7589 Other specified diseases of blood and blood-forming organs: Secondary | ICD-10-CM | POA: Insufficient documentation

## 2022-09-06 LAB — CBC WITH DIFFERENTIAL (CANCER CENTER ONLY)
Abs Immature Granulocytes: 0 10*3/uL (ref 0.00–0.07)
Basophils Absolute: 0 10*3/uL (ref 0.0–0.1)
Basophils Relative: 1 %
Eosinophils Absolute: 0.2 10*3/uL (ref 0.0–0.5)
Eosinophils Relative: 3 %
HCT: 35.2 % — ABNORMAL LOW (ref 39.0–52.0)
Hemoglobin: 11.5 g/dL — ABNORMAL LOW (ref 13.0–17.0)
Immature Granulocytes: 0 %
Lymphocytes Relative: 30 %
Lymphs Abs: 1.3 10*3/uL (ref 0.7–4.0)
MCH: 34.2 pg — ABNORMAL HIGH (ref 26.0–34.0)
MCHC: 32.7 g/dL (ref 30.0–36.0)
MCV: 104.8 fL — ABNORMAL HIGH (ref 80.0–100.0)
Monocytes Absolute: 0.3 10*3/uL (ref 0.1–1.0)
Monocytes Relative: 8 %
Neutro Abs: 2.6 10*3/uL (ref 1.7–7.7)
Neutrophils Relative %: 58 %
Platelet Count: 209 10*3/uL (ref 150–400)
RBC: 3.36 MIL/uL — ABNORMAL LOW (ref 4.22–5.81)
RDW: 15.2 % (ref 11.5–15.5)
WBC Count: 4.4 10*3/uL (ref 4.0–10.5)
nRBC: 0 % (ref 0.0–0.2)

## 2022-09-06 LAB — CMP (CANCER CENTER ONLY)
ALT: 12 U/L (ref 0–44)
AST: 15 U/L (ref 15–41)
Albumin: 3.5 g/dL (ref 3.5–5.0)
Alkaline Phosphatase: 70 U/L (ref 38–126)
Anion gap: 4 — ABNORMAL LOW (ref 5–15)
BUN: 22 mg/dL (ref 8–23)
CO2: 30 mmol/L (ref 22–32)
Calcium: 9.6 mg/dL (ref 8.9–10.3)
Chloride: 106 mmol/L (ref 98–111)
Creatinine: 1.16 mg/dL (ref 0.61–1.24)
GFR, Estimated: >60 mL/min (ref >60)
Glucose, Bld: 109 mg/dL — ABNORMAL HIGH (ref 70–99)
Potassium: 4.2 mmol/L (ref 3.5–5.1)
Sodium: 140 mmol/L (ref 135–145)
Total Bilirubin: 0.4 mg/dL (ref 0.3–1.2)
Total Protein: 6.8 g/dL (ref 6.5–8.1)

## 2022-09-06 NOTE — Progress Notes (Signed)
  [place] OFFICE PROGRESS NOTE   Diagnosis: Macrocytosis, anemia  INTERVAL HISTORY:   Barry Decker returns as scheduled.  Overall he feels well.  No interim infections.  He has chronic upper back/neck pain.  No new areas of pain.  No bowel or bladder problems.  He has a good appetite.  Objective:  Vital signs in last 24 hours:  Blood pressure 124/60, pulse 60, temperature 98.2 F (36.8 C), temperature source Oral, resp. rate 18, height 6' (1.829 m), weight 233 lb (105.7 kg), SpO2 98 %.    HEENT: No thrush or ulcers. Resp: Lungs clear bilaterally. Cardio: Regular rate and rhythm. GI: No hepatosplenomegaly. Vascular: No leg edema.   Lab Results:  Lab Results  Component Value Date   WBC 4.4 09/06/2022   HGB 11.5 (L) 09/06/2022   HCT 35.2 (L) 09/06/2022   MCV 104.8 (H) 09/06/2022   PLT 209 09/06/2022   NEUTROABS 2.6 09/06/2022    Imaging:  No results found.  Medications: I have reviewed the patient's current medications.  Assessment/Plan: Red cell macrocytosis, chronic Total myeloma Mild macrocytic anemia Bone marrow biopsy 08/24/2022-hypercellular with increased number of plasma cells representing 15% of all cells in the aspirate associated with interstitial infiltrates and small clusters in the clot and biopsy sections.  Plasma cells show kappa light chain restriction consistent with plasma cell neoplasm.  Background shows trilineage hematopoiesis with nonspecific changes.  Normal cytogenetics.  FISH with no evidence of abnormalities. IgA kappa monoclonal protein, elevated free kappa light chains Mild leukopenia on CBC 09/27/2020, normal on 10/18/2020, 06/21/2021, 201 2024 History of B12 deficiency Hypertension Hyperlipidemia Severe AS status post TAVR April 2019 Nonobstructive CAD Thoracic aortic aneurysm  Disposition: Barry Decker appears to have early stage multiple myeloma.  We reviewed the diagnosis with him at today's visit.  We are referring him  for a metastatic bone survey.  We had preliminary discussion regarding treatment with Revlimid/dexamethasone.  He was provided with printed information on both drugs.  He will return for follow-up in approximately 2 weeks for additional discussion.   Patient seen with Dr. Benay Spice.  Ned Card ANP/GNP-BC   09/06/2022  2:40 PM This was a shared visit with Ned Card.  We discussed the bone marrow findings with Barry Decker.  He has an IgA kappa monoclonal protein and a bone marrow plasmacytosis with kappa restriction.  He has mild anemia. The clinical presentation is consistent with a diagnosis of multiple myeloma.  Cytogenetics did not reveal high risk features.  He will be referred for a staging bone survey and return for an office visit to discuss treatment options.  We will most likely recommend treatment with Revlimid/Decadron.  I was present for greater than 50% of today's visit.  I performed medical decision making.  Julieanne Manson, MD

## 2022-09-07 LAB — IGA: IgA: 582 mg/dL — ABNORMAL HIGH (ref 61–437)

## 2022-09-07 LAB — KAPPA/LAMBDA LIGHT CHAINS
Kappa free light chain: 161.7 mg/L — ABNORMAL HIGH (ref 3.3–19.4)
Kappa, lambda light chain ratio: 9.4 — ABNORMAL HIGH (ref 0.26–1.65)
Lambda free light chains: 17.2 mg/L (ref 5.7–26.3)

## 2022-09-07 LAB — BETA 2 MICROGLOBULIN, SERUM: Beta-2 Microglobulin: 2.2 mg/L (ref 0.6–2.4)

## 2022-09-08 LAB — PROTEIN ELECTROPHORESIS, SERUM
A/G Ratio: 1.1 (ref 0.7–1.7)
Albumin ELP: 3.3 g/dL (ref 2.9–4.4)
Alpha-1-Globulin: 0.2 g/dL (ref 0.0–0.4)
Alpha-2-Globulin: 0.6 g/dL (ref 0.4–1.0)
Beta Globulin: 1.2 g/dL (ref 0.7–1.3)
Gamma Globulin: 0.9 g/dL (ref 0.4–1.8)
Globulin, Total: 2.9 g/dL (ref 2.2–3.9)
Total Protein ELP: 6.2 g/dL (ref 6.0–8.5)

## 2022-09-19 ENCOUNTER — Inpatient Hospital Stay: Payer: PPO | Attending: Oncology | Admitting: Nurse Practitioner

## 2022-09-19 VITALS — BP 117/60 | HR 64 | Temp 98.2°F | Resp 18 | Ht 72.0 in | Wt 231.8 lb

## 2022-09-19 DIAGNOSIS — E785 Hyperlipidemia, unspecified: Secondary | ICD-10-CM | POA: Insufficient documentation

## 2022-09-19 DIAGNOSIS — I2510 Atherosclerotic heart disease of native coronary artery without angina pectoris: Secondary | ICD-10-CM | POA: Diagnosis not present

## 2022-09-19 DIAGNOSIS — D72819 Decreased white blood cell count, unspecified: Secondary | ICD-10-CM | POA: Insufficient documentation

## 2022-09-19 DIAGNOSIS — I10 Essential (primary) hypertension: Secondary | ICD-10-CM | POA: Insufficient documentation

## 2022-09-19 DIAGNOSIS — D7589 Other specified diseases of blood and blood-forming organs: Secondary | ICD-10-CM | POA: Insufficient documentation

## 2022-09-19 DIAGNOSIS — D649 Anemia, unspecified: Secondary | ICD-10-CM | POA: Diagnosis not present

## 2022-09-19 DIAGNOSIS — E538 Deficiency of other specified B group vitamins: Secondary | ICD-10-CM | POA: Insufficient documentation

## 2022-09-19 DIAGNOSIS — C9000 Multiple myeloma not having achieved remission: Secondary | ICD-10-CM | POA: Diagnosis not present

## 2022-09-19 DIAGNOSIS — D539 Nutritional anemia, unspecified: Secondary | ICD-10-CM | POA: Diagnosis not present

## 2022-09-19 DIAGNOSIS — I712 Thoracic aortic aneurysm, without rupture, unspecified: Secondary | ICD-10-CM | POA: Diagnosis not present

## 2022-09-19 NOTE — Progress Notes (Signed)
  [place] OFFICE PROGRESS NOTE   Diagnosis: Macrocytosis, anemia  INTERVAL HISTORY:   Mr. Bian returns as scheduled.  He continues to feel well.  No new areas of pain.  No interim illness/infection.  He has a good appetite.  Objective:  Vital signs in last 24 hours:  Blood pressure 117/60, pulse 64, temperature 98.2 F (36.8 C), temperature source Oral, resp. rate 18, height 6' (1.829 m), weight 231 lb 12.8 oz (105.1 kg), SpO2 98 %.    HEENT: No thrush or ulcers. Resp: Lungs clear bilaterally. Cardio: Regular rate and rhythm. GI: Abdomen soft and nontender.  No hepatosplenomegaly. Vascular: Trace bilateral ankle edema. Neuro: Alert and oriented.   Lab Results:  Lab Results  Component Value Date   WBC 4.4 09/06/2022   HGB 11.5 (L) 09/06/2022   HCT 35.2 (L) 09/06/2022   MCV 104.8 (H) 09/06/2022   PLT 209 09/06/2022   NEUTROABS 2.6 09/06/2022    Imaging:  No results found.  Medications: I have reviewed the patient's current medications.  Assessment/Plan: Red cell macrocytosis, chronic Multiple myeloma Mild macrocytic anemia Bone marrow biopsy 08/24/2022-hypercellular with increased number of plasma cells representing 15% of all cells in the aspirate associated with interstitial infiltrates and small clusters in the clot and biopsy sections.  Plasma cells show kappa light chain restriction consistent with plasma cell neoplasm.  Background shows trilineage hematopoiesis with nonspecific changes.  Normal cytogenetics.  FISH with no evidence of abnormalities. IgA kappa monoclonal protein, elevated free kappa light chains 09/06/2022 bone survey-no radiographic evidence of multiple myeloma Mild leukopenia on CBC 09/27/2020, normal on 10/18/2020, 06/21/2021, 201 2024 History of B12 deficiency Hypertension Hyperlipidemia Severe AS status post TAVR April 2019 Nonobstructive CAD Thoracic aortic aneurysm  Disposition: Mr. Paschen appears to have early stage  multiple myeloma.  We reviewed the diagnosis and treatment options with him at today's visit.  We discussed observation versus Revlimid/dexamethasone.  He is comfortable with observation.  He will return for repeat labs in approximately 3 months with an office visit 1 week later to review results.  We discussed that he is at increased risk for infection based on this diagnosis and recommend he remain up-to-date on vaccines.  We will see him in follow-up in approximately 3 months.  Patient seen with Dr. Benay Spice.   Ned Card ANP/GNP-BC   09/19/2022  8:22 AM  This was a shared visit with Ned Card.  We reviewed the bone survey findings with Mr. Coody.  He appears to have a early multiple myeloma.  He has mild anemia and no other endorgan manifestations of multiple myeloma.  We discussed treatment options including observation or initiating systemic therapy.  He is comfortable with observation.  We will follow his clinical status and myeloma panel closely.  The plan is to initiate Revlimid/Decadron for evidence of disease progression.  I was present for greater than 50% of today's visit.  I performed medical decision making.  Julieanne Manson, MD

## 2022-09-25 ENCOUNTER — Ambulatory Visit: Payer: PPO | Attending: Internal Medicine | Admitting: *Deleted

## 2022-09-25 DIAGNOSIS — Z5181 Encounter for therapeutic drug level monitoring: Secondary | ICD-10-CM | POA: Diagnosis not present

## 2022-09-25 DIAGNOSIS — I8290 Acute embolism and thrombosis of unspecified vein: Secondary | ICD-10-CM | POA: Diagnosis not present

## 2022-09-25 LAB — POCT INR: POC INR: 3.4

## 2022-09-25 NOTE — Patient Instructions (Signed)
Description   -Hold warfarin tomorrow -Then START taking warfarin 1.5 tablets daily except for 1 tablet on Mondays and Fridays. -Recheck INR in 3 weeks.  Coumadin Clinic [phone], Main number [phone].

## 2022-10-14 ENCOUNTER — Other Ambulatory Visit: Payer: Self-pay | Admitting: Family Medicine

## 2022-10-16 ENCOUNTER — Ambulatory Visit: Payer: PPO | Attending: Cardiovascular Disease | Admitting: Pharmacist

## 2022-10-16 DIAGNOSIS — Z5181 Encounter for therapeutic drug level monitoring: Secondary | ICD-10-CM

## 2022-10-16 DIAGNOSIS — Z79899 Other long term (current) drug therapy: Secondary | ICD-10-CM | POA: Diagnosis not present

## 2022-10-16 DIAGNOSIS — I8290 Acute embolism and thrombosis of unspecified vein: Secondary | ICD-10-CM | POA: Diagnosis not present

## 2022-10-16 LAB — POCT INR: INR: 2.9 (ref 2.0–3.0)

## 2022-10-16 NOTE — Patient Instructions (Signed)
Description    Continue taking warfarin 1.5 tablets daily except for 1 tablet on Mondays and Fridays. -Recheck INR in 5 weeks.  Coumadin Clinic [phone], Main number [phone].

## 2022-10-30 ENCOUNTER — Telehealth: Payer: Self-pay | Admitting: Pharmacist

## 2022-10-30 NOTE — Progress Notes (Signed)
Care Management & Coordination Services Pharmacy Team  Reason for Encounter: Appointment Reminder  Contacted patient to confirm telephone appointment with Erskine Emery, PharmD on 10/31/2022 at 9 am. Spoke with patient on 10/30/2022    Star Rating Drugs:  Lisinopril 20 mg last filled 08/19/2022 90 DS Rosuvastatin 20 mg last filled 08/21/2022 90 DS   Care Gaps: Annual wellness visit in last year? Yes   Future Appointments  Date Time Provider Department Center  10/31/2022  9:00 AM Erroll Luna, [place] [facility] None  11/20/2022  9:45 AM [facility] None  11/22/2022 11:20 AM Louanne Skye Devoria Albe., NP [facility]  12/11/2022 10:30 AM [facility] PHLEBOTOMIST [facility] None  12/18/2022 11:40 AM Ladene Artist, MD [facility] None  07/03/2023 11:45 AM [facility] [place] VISIT 1 [facility]   April D Calhoun, [facility] Clinical Pharmacist Assistant [phone]

## 2022-10-31 ENCOUNTER — Ambulatory Visit: Payer: PPO | Admitting: Pharmacist

## 2022-10-31 NOTE — Progress Notes (Signed)
Care Management & Coordination Services Pharmacy Note  10/31/2022 Name:  COLESTON SARFF MRN:  [id number] DOB:  February 23, 1933  Summary: Pharmd FU.  Started once weekly statin and is tolerating well.  Noted improvement in LDL.  BP also remains controlled  Recommendations/Changes made from today's visit: Continue meds, could consider twice weekly statin in future  Follow up plan: FU as needed   Subjective: Barry Decker is an 89 y.o. year old male who is a primary patient of Durene Cal, Aldine Contes, MD.  The care coordination team was consulted for assistance with disease management and care coordination needs.    Engaged with patient by telephone for follow up visit.   Recent office visits:  04/04/2022 OV (PCP) Shelva Majestic, MD; no medication changes indicated.   Recent consult visits:  07/20/2022 OV (Oncology) Luna Fuse, MD; no medication changes indicated.   03/21/2022 OV (Oncology) Luna Fuse, MD; no medication changes indicated.   Hospital visits:  None in previous 6 months   Objective:  Lab Results  Component Value Date   CREATININE 1.16 09/06/2022   BUN 22 09/06/2022   GFR 59.83 (L) 04/04/2022   EGFR 72 11/11/2020   GFRNONAA >60 09/06/2022   GFRAA 70 05/11/2020   NA 140 09/06/2022   K 4.2 09/06/2022   CALCIUM 9.6 09/06/2022   CO2 30 09/06/2022   GLUCOSE 109 (H) 09/06/2022    Lab Results  Component Value Date/Time   HGBA1C 5.2 09/21/2017 09:17 AM   GFR 59.83 (L) 04/04/2022 10:57 AM   GFR 57.48 (L) 12/05/2021 08:20 AM    Last diabetic Eye exam: No results found for: "HMDIABEYEEXA"  Last diabetic Foot exam: No results found for: "HMDIABFOOTEX"   Lab Results  Component Value Date   CHOL 162 04/04/2022   HDL 53.90 04/04/2022   LDLCALC 84 04/04/2022   LDLDIRECT 99.0 11/04/2021   TRIG 121.0 04/04/2022   CHOLHDL 3 04/04/2022       Latest Ref Rng & Units 09/06/2022    1:32 PM 04/04/2022   10:57 AM 11/04/2021    9:15 AM  Hepatic Function  Total  Protein 6.5 - 8.1 g/dL 6.8  6.7  6.8   Albumin 3.5 - 5.0 g/dL 3.5  4.0  3.8   AST 15 - 41 U/L 15  15  17    ALT 0 - 44 U/L 12  16  20    Alk Phosphatase 38 - 126 U/L 70  64  63   Total Bilirubin 0.3 - 1.2 mg/dL 0.4  0.5  0.4     Lab Results  Component Value Date/Time   TSH 0.82 11/11/2013 10:14 AM   TSH 0.76 10/25/2012 10:09 AM       Latest Ref Rng & Units 09/06/2022    1:32 PM 08/24/2022    9:25 AM 07/20/2022   11:05 AM  CBC  WBC 4.0 - 10.5 K/uL 4.4  3.2  3.8   Hemoglobin 13.0 - 17.0 g/dL 40.9  81.1  91.4   Hematocrit 39.0 - 52.0 % 35.2  34.0  38.0   Platelets 150 - 400 K/uL 209  184  209     Lab Results  Component Value Date/Time   VITAMINB12 690 04/04/2022 10:57 AM   VITAMINB12 584 04/01/2021 10:00 AM    Clinical ASCVD: No  The ASCVD Risk score (Arnett DK, et al., 2019) failed to calculate for the following reasons:   The 2019 ASCVD risk score is only valid for ages 86 to  79        06/27/2022   11:43 AM 05/27/2021   10:18 AM 04/01/2021    8:53 AM  Depression screen PHQ 2/9  Decreased Interest 0 0 0  Down, Depressed, Hopeless 0 0 0  PHQ - 2 Score 0 0 0     Social History   Tobacco Use  Smoking Status Former   Packs/day: 1.00   Years: 20.00   Additional pack years: 0.00   Total pack years: 20.00   Types: Cigarettes   Quit date: 09/15/1980   Years since quitting: 42.1  Smokeless Tobacco Never   BP Readings from Last 3 Encounters:  09/19/22 117/60  09/06/22 124/60  08/24/22 114/69   Pulse Readings from Last 3 Encounters:  09/19/22 64  09/06/22 60  08/24/22 63   Wt Readings from Last 3 Encounters:  09/19/22 231 lb 12.8 oz (105.1 kg)  09/06/22 233 lb (105.7 kg)  08/24/22 228 lb (103.4 kg)   BMI Readings from Last 3 Encounters:  09/19/22 31.44 kg/m  09/06/22 31.60 kg/m  08/24/22 30.92 kg/m    No Known Allergies  Medications Reviewed Today     Reviewed by Erroll Luna, [facility] (Pharmacist) on 10/31/22 at 0921  Med List Status: <None>    Medication Order Taking? Sig Documenting Provider Last Dose Status Informant  allopurinol (ZYLOPRIM) 300 MG tablet [id number] No TAKE 1 TABLET BY MOUTH EVERY DAY Shelva Majestic, MD Taking Active   amLODipine (NORVASC) 10 MG tablet [id number] No TAKE 1 TABLET BY MOUTH EVERY DAY Shelva Majestic, MD Taking Active   amoxicillin (AMOXIL) 500 MG capsule [id number] No TAKE 4 CAPS BY MOUTH AS DIRECTED 1 HOUR PRIOR TO DENTAL WORK INCLUDING CLEANINGS  Patient not taking: Reported on 06/27/2022   Kathleene Hazel, MD Not Taking Active   Ascorbic Acid (VITAMIN C) 1000 MG tablet [id number] No Take 1,000 mg by mouth daily. [provider] Taking Active Self  cyanocobalamin 1000 MCG tablet [id number] No Take 1,000 mcg by mouth daily. [provider] Taking Active   furosemide (LASIX) 20 MG tablet [id number] No TAKE 1 TABLET BY MOUTH EVERY DAY Kathleene Hazel, MD Taking Active   lisinopril (ZESTRIL) 20 MG tablet [id number]  TAKE 1 TABLET BY MOUTH EVERY DAY Shelva Majestic, MD  Active   potassium citrate (UROCIT-K) 10 MEQ (1080 MG) SR tablet [id number] No Take 1 tablet (10 mEq total) by mouth 3 (three) times daily with meals. Swords, Valetta Mole, MD Taking Active Self  rosuvastatin (CRESTOR) 20 MG tablet [id number] No TAKE 1 TABLET BY MOUTH WEEKLY Shelva Majestic, MD Taking Active   warfarin (COUMADIN) 5 MG tablet [id number] No TAKE 1 TO 1&1/2 TABLETS ONCE DAILY BY MOUTH AS DIRECTED BY ANTICOAGULATION CLINIC Kathleene Hazel, MD Taking Active             SDOH:  (Social Determinants of Health) assessments and interventions performed: No, done within year Financial Resource Strain: Low Risk  (06/27/2022)   Overall Financial Resource Strain (CARDIA)    Difficulty of Paying Living Expenses: Not hard at all   Food Insecurity: No Food Insecurity (06/27/2022)   Hunger Vital Sign    Worried About Running Out of Food in the Last Year: Never true    Ran Out of Food in the Last Year:  Never true     SDOH Interventions    Flowsheet Row Clinical Support from 06/27/2022 in [facility]  SDOH [employer] Insecurity  Interventions Intervention Not Indicated  Housing Interventions Intervention Not Indicated  Transportation Interventions Intervention Not Indicated  Utilities Interventions Intervention Not Indicated  Financial Strain Interventions Intervention Not Indicated  Physical Activity Interventions Intervention Not Indicated  Stress Interventions Intervention Not Indicated  Social Connections Interventions Intervention Not Indicated       Medication Assistance: None required.  Patient affirms current coverage meets needs.  Medication Access: Within the past 30 days, how often has patient missed a dose of medication? 0 Is a pillbox or other method used to improve adherence? Yes  Factors that may affect medication adherence? no barriers identified Are meds synced by current pharmacy? No  Are meds delivered by current pharmacy? No  Does patient experience delays in picking up medications due to transportation concerns? No   [facility]stream Services Reviewed: Is patient disadvantaged to use [facility]?: Yes  Current Rx insurance plan: HTA Name and location of Current pharmacy:  [facility] - [place], [place] - [address] [place] [address] Phone: [phone] Fax: [phone]  [id number] - [place], [place] - [address] AT [address] OF [facility] [address] [place] [address] Phone: [phone] Fax: [phone]  [facility] services reviewed with patient today?: No  Patient requests to transfer care to [facility]?: No  Reason patient declined to change pharmacies: Disadvantaged due to insurance/mail order  Compliance/Adherence/Medication fill history: [facility]:  Lisinopril 20 mg last filled 08/19/2022 90  DS Rosuvastatin 20 mg last filled 08/21/2022 90 DS     Care Gaps: Annual wellness visit in last year? Yes   Assessment/Plan    Hypertension  (Status:Goal on track: YES.)   Med Management Intervention: None  (BP goal <130/80) 10/31/22 -Controlled -Current treatment: Amlodipine 10mg  daily Appropriate, Effective, Safe, Accessible Lisinopril 20mg  daily Appropriate, Effective, Safe, Accessible -Medications previously tried: none noted  -Current home readings: states average is 120s over 60-70s -Current exercise habits: same see previous -Denies hypotensive/hypertensive symptoms -Educated on BP goals and benefits of medications for prevention of heart attack, stroke and kidney damage; Exercise goal of 150 minutes per week; Importance of home blood pressure monitoring; Symptoms of hypotension and importance of maintaining adequate hydration; -BP controlled, denies any signs of hyper or hypotension.  Feels well and is adherent with medications.  No changes needed at this time.  Update 07/25/21 120s/60s BP at home NO symptoms of HTN Denies dizziness or HA No changes needed at this time, continue home monitoring.  Hyperlipidemia: (LDL goal < 70) 10/31/22 -Uncontrolled, most recent LDL is 84 -Current treatment: Rosuvastatin 20mg  once weekly Appropriate, Query effective, ,  -Medications previously tried: none noted  -Has previously been offered a statin, but declined and opted to work on lifestyle changes -Educated on Cholesterol goals;  Benefits of statin for ASCVD risk reduction; Importance of limiting foods high in cholesterol; -He has not started the once weekly statin and is tolerating well.  Discussed importance of adherence with this and noted improvement in LDL since he has started.  For now, continue as is.  Could consider increasing to twice weekly if he continues to tolerate.  Shoot for goal LDL < 70.  Update 07/25/21 Most recent LDL is 112, was suggested at that time by  PCP to start Crestor 20mg  once weekly. Patient did not end up starting this medication.  I believe a VM was left on his phone and we never got in touch with him regarding his results. Patient with multiple  risk factors would benefit from starting statin. Has follow up visit with PCP in April, is open to starting once weekly if PCP agrees this is best thing to do. Will consult with PCP to see if we want to start Crestor 20mg  once weekly now or wait until discussion at April OV. Continue to work on limiting fried/fatty foods.  Aortic Valve Replacement (Goal: -) -Controlled -Current treatment  Warfarin 5mg  - as directed by clinic -Medications previously tried: none noted -At goal most recently  -He denies any abnormal bleeding, bruising, etc  -Recommended to continue current medication          Willa Frater, PharmD Clinical Pharmacist  [facility] [phone]

## 2022-11-12 ENCOUNTER — Other Ambulatory Visit: Payer: Self-pay | Admitting: Cardiovascular Disease

## 2022-11-12 DIAGNOSIS — Z5181 Encounter for therapeutic drug level monitoring: Secondary | ICD-10-CM

## 2022-11-20 ENCOUNTER — Ambulatory Visit: Payer: PPO | Attending: Cardiology

## 2022-11-20 DIAGNOSIS — Z79899 Other long term (current) drug therapy: Secondary | ICD-10-CM | POA: Diagnosis not present

## 2022-11-20 DIAGNOSIS — Z5181 Encounter for therapeutic drug level monitoring: Secondary | ICD-10-CM | POA: Diagnosis not present

## 2022-11-20 DIAGNOSIS — I8290 Acute embolism and thrombosis of unspecified vein: Secondary | ICD-10-CM

## 2022-11-20 LAB — POCT INR: INR: 3.8 — AB (ref 2.0–3.0)

## 2022-11-20 NOTE — Patient Instructions (Signed)
HOLD TOMORROW'S COUMADIN ONLY THEN Continue taking warfarin 1.5 tablets daily except for 1 tablet on Mondays and Fridays. -Recheck INR in 4 weeks.  Coumadin Clinic [phone], Main number [phone].

## 2022-11-21 ENCOUNTER — Encounter: Payer: Self-pay | Admitting: Family Medicine

## 2022-11-21 ENCOUNTER — Ambulatory Visit (INDEPENDENT_AMBULATORY_CARE_PROVIDER_SITE_OTHER): Payer: PPO | Admitting: Family Medicine

## 2022-11-21 ENCOUNTER — Ambulatory Visit (INDEPENDENT_AMBULATORY_CARE_PROVIDER_SITE_OTHER)
Admission: RE | Admit: 2022-11-21 | Discharge: 2022-11-21 | Disposition: A | Discharge status: Home / Self Care | Payer: PPO | Source: Ambulatory Visit | Attending: Family Medicine | Admitting: Family Medicine

## 2022-11-21 VITALS — BP 120/60 | HR 60 | Temp 98.1°F | Ht 72.0 in | Wt 230.6 lb

## 2022-11-21 DIAGNOSIS — C9000 Multiple myeloma not having achieved remission: Secondary | ICD-10-CM | POA: Diagnosis not present

## 2022-11-21 DIAGNOSIS — M545 Low back pain, unspecified: Secondary | ICD-10-CM

## 2022-11-21 DIAGNOSIS — I10 Essential (primary) hypertension: Secondary | ICD-10-CM | POA: Diagnosis not present

## 2022-11-21 DIAGNOSIS — M8588 Other specified disorders of bone density and structure, other site: Secondary | ICD-10-CM | POA: Diagnosis not present

## 2022-11-21 MED ORDER — PREDNISONE 20 MG PO TABS: ORAL_TABLET | ORAL | 0 refills | Status: DC

## 2022-11-21 NOTE — Progress Notes (Signed)
Phone [phone] In person visit   Subjective:   Barry Decker is a 89 y.o. year old very pleasant male patient who presents for/with See problem oriented charting Chief Complaint  Patient presents with   Back Pain    Pt c/o lower back pain that is getting slightly worse.    Past Medical History-  Patient Active Problem List   Diagnosis Date Noted   Multiple myeloma (HCC) 11/21/2022    Priority: High   Thrombus 05/31/2020    Priority: High   Ascending aortic aneurysm (HCC) 03/26/2020    Priority: High   Status post transcatheter aortic valve replacement (TAVR) using bioprosthesis 09/25/2017    Priority: High   Aortic atherosclerosis (HCC) 03/26/2020    Priority: Medium    B12 deficiency 09/23/2019    Priority: Medium    Chronic diastolic CHF (congestive heart failure) (HCC) 03/30/2017    Priority: Medium    HYPERCHOLESTEROLEMIA WITH HIGH HDL 01/31/2007    Priority: Medium    Gout 01/31/2007    Priority: Medium    Essential hypertension 01/31/2007    Priority: Medium    History of malignant neoplasm of large intestine 01/31/2007    Priority: Medium    Osteoarthritis of cervical spine 08/31/2015    Priority: Low   Osteoarthritis of left knee 05/25/2014    Priority: Low   Medication management 12/02/2020   Encounter for therapeutic drug monitoring 05/31/2020   Macrocytic anemia 03/21/2019   Kidney stones    Throat pain in adult 08/31/2015    Medications- reviewed and updated Current Outpatient Medications  Medication Sig Dispense Refill   allopurinol (ZYLOPRIM) 300 MG tablet TAKE 1 TABLET BY MOUTH EVERY DAY 90 tablet 3   amLODipine (NORVASC) 10 MG tablet TAKE 1 TABLET BY MOUTH EVERY DAY 90 tablet 3   Ascorbic Acid (VITAMIN C) 1000 MG tablet Take 1,000 mg by mouth daily.     cyanocobalamin 1000 MCG tablet Take 1,000 mcg by mouth daily.     furosemide (LASIX) 20 MG tablet TAKE 1 TABLET BY MOUTH EVERY DAY 90 tablet 3   lisinopril (ZESTRIL) 20 MG tablet TAKE 1  TABLET BY MOUTH EVERY DAY 90 tablet 3   potassium citrate (UROCIT-K) 10 MEQ (1080 MG) SR tablet Take 1 tablet (10 mEq total) by mouth 3 (three) times daily with meals. 300 tablet 1   rosuvastatin (CRESTOR) 20 MG tablet TAKE 1 TABLET BY MOUTH WEEKLY 13 tablet 3   warfarin (COUMADIN) 5 MG tablet TAKE 1 TO 1&1/2 TABLETS ONCE DAILY BY MOUTH AS DIRECTED BY ANTICOAGULATION CLINIC 130 tablet 0   amoxicillin (AMOXIL) 500 MG capsule TAKE 4 CAPS BY MOUTH AS DIRECTED 1 HOUR PRIOR TO DENTAL WORK INCLUDING CLEANINGS (Patient not taking: Reported on 11/21/2022) 8 capsule 2   No current facility-administered medications for this visit.     Objective:  BP 120/60   Pulse 60   Temp 98.1 F (36.7 C)   Ht 6' (1.829 m)   Wt 230 lb 9.6 oz (104.6 kg)   SpO2 94%   BMI 31.27 kg/m  Gen: NAD, resting comfortably CV: RRR no murmurs rubs or gallops Lungs: CTAB no crackles, wheeze, rhonchi Ext: no edema Skin: warm, dry  Back - Normal skin, Spine with normal alignment and no deformity.  No tenderness to vertebral process palpation except for pain on the left side around L4 approximately-with some paraspinous muscle tenderness in this area as well-rather pinpoint.  Otherwise no paraspinous muscles are not tender and without  spasm.   Neuro- no saddle anesthesia, 5/5 strength lower extremities    Assessment and Plan   # Low back pain S:patient multiple myeloma was last visit on 09/19/2022 with oncology-noted as early stage-plan at that point was for observation and repeat labs in 3 months. He reports back pain located lumbar spine just to the left- starting about 5 days ago. Did drive to [place] last week and wonder if that triggered things. No fall or injury. Moving cautiously- if turns too quickly triggers it.  Severe pain noted   -has had off and on back pain for many years. Recently had some surges in pain and did his usual to see if it would calm down but it simply has not. Last night had to sleep in recliner to  get any relief- ended up taking some old hydrocodone it was so bad.   Looking back to MR angiogram of the chest from 12/02/2020 "Of note, there is a suspected T8 compression fracture/vertebral body fracture which is not well characterized on the current protocol. If the patient has ongoing symptoms at this site and further imaging is warranted, consider a dedicated thoracic MR, or alternatively CT." But does not have pain in that area  ROS-No saddle anesthesia, bladder incontinence, fecal incontinence, weakness in extremity, numbness or tingling in extremity. History negative for trauma,  fever, chills, unintentional weight loss, recent bacterial infection, recent IV drug use, HIV -does have pain worse at night and while supine A/P: 89 year old gentleman with multiple myeloma on observation with lumbar spine pain (midline but also just to the left of midline and lumbar spine-rather pinpoint) rather intense keeping him up at night last night-stat lumbar spine films ordered to look for evidence of malignancy or compression fracture.  He has had very good response to prednisone in the past with back pain about 2 years ago and we discussed if no fracture or malignancy likely starting prednisone which I ordered today -Avoid NSAIDs with cardiac risk -Of note multiple Aloma has been stable on observation  #hypertension S: medication: Amlodipine 10 mg, Lasix 20 mg, lisinopril 20 mg  BP Readings from Last 3 Encounters:  11/21/22 120/60  09/19/22 117/60  09/06/22 124/60  A/P: Thankfully doing well despite pain-continue current medications   Recommended follow up: Return for as needed for new, worsening, persistent symptoms. Future Appointments  Date Time Provider Department Center  11/22/2022 11:20 AM Gaston Islam., NP [facility]  12/11/2022 10:30 AM [facility] PHLEBOTOMIST [facility] None  12/18/2022  9:45 AM [facility] None  12/18/2022 11:40 AM Ladene Artist, MD [facility] None  07/03/2023 11:45 AM [facility] ANNUAL WELLNESS VISIT 1 [facility]    Lab/Order associations:   ICD-10-CM   1. Acute midline low back pain without sciatica  M54.50 DG Lumbar Spine Complete    2. Multiple myeloma, remission status unspecified (HCC)  C90.00     3. Essential hypertension  I10       Meds ordered this encounter  Medications   predniSONE (DELTASONE) 20 MG tablet    Sig: Wait until x-ray read back. Take 1 tablet by mouth daily for 5 days, then 1/2 tablet daily for 2 days    Dispense:  6 tablet    Refill:  0    Return precautions advised.  Tana Conch, MD

## 2022-11-21 NOTE — Patient Instructions (Addendum)
Please go to [facility]  central X-ray  - located [address] across the street from [place] - in the basement - Hours: 8:30-5:00 PM M-F (with lunch from 12:30- 1 PM). You do NOT need an appointment.    Start prednisone IF no fracture on x-ray   Recommended follow up: Return for as needed for new, worsening, persistent symptoms.

## 2022-11-21 NOTE — Progress Notes (Unsigned)
Office Visit    Patient Name: Barry Decker Date of Encounter: 11/21/2022  Primary Care Provider:  Shelva Majestic, MD Primary Cardiologist:  Barry Carrow, MD Primary Electrophysiologist: None   Past Medical History    Past Medical History:  Diagnosis Date   Aortic atherosclerosis [facility])    Aortic valve disease    a. severe aortic stenosis s/p TAVR 09/2017.   Arthritis    "spinal" (11/16/2015)   Atypical nevus 05/14/2013   moderate atypia - left scapula   Colon cancer (HCC)    precancer-partial colectomy   Dilated aortic root (HCC)    Elevated HDL    First degree AV block    First degree AV block    Gout    Heart murmur    History of kidney stones    Hyperlipidemia    Hypertension    LBBB (left bundle branch block)    Nonobstructive atherosclerosis of coronary artery    Squamous cell skin cancer 08/04/2011   Left outer brow - CX3 + 5FU   Thoracic aortic aneurysm [facility])    Past Surgical History:  Procedure Laterality Date   BACK SURGERY     CATARACT EXTRACTION W/ INTRAOCULAR LENS  IMPLANT, BILATERAL Bilateral 2017   COLONOSCOPY W/ POLYPECTOMY     FACET JOINT INJECTION  2009   IR BONE MARROW BIOPSY & ASPIRATION  08/24/2022   LITHOTRIPSY  2010   LUMBAR LAMINECTOMY  ~ 1992   PARTIAL COLECTOMY  1997   precancerours   PARTIAL KNEE ARTHROPLASTY Left 11/16/2015   Procedure: LEFT KNEE UNICOMPARTMENTAL ;  Surgeon: Barry Apley, MD;  Location: [facility] OR;  Service: Orthopedics;  Laterality: Left;   REPLACEMENT UNICONDYLAR JOINT KNEE Left 11/16/2015   RIGHT/LEFT HEART CATH AND CORONARY ANGIOGRAPHY N/A 08/10/2017   Procedure: RIGHT/LEFT HEART CATH AND CORONARY ANGIOGRAPHY;  Surgeon: Barry Hazel, MD;  Location: [facility] INVASIVE CV LAB;  Service: Cardiovascular;  Laterality: N/A;   TEE WITHOUT CARDIOVERSION N/A 09/25/2017   Procedure: TRANSESOPHAGEAL ECHOCARDIOGRAM (TEE);  Surgeon: Barry Hazel, MD;  Location: [facility] OR;  Service: Open Heart Surgery;   Laterality: N/A;   TONSILLECTOMY     TRANSCATHETER AORTIC VALVE REPLACEMENT, TRANSFEMORAL N/A 09/25/2017   Procedure: TRANSCATHETER AORTIC VALVE REPLACEMENT, TRANSFEMORAL;  Surgeon: Barry Hazel, MD;  Location: [facility] OR;  Service: Open Heart Surgery;  Laterality: N/A;   TYMPANOPLASTY Left 1998   ruptured ear drum with skin graft    Allergies  No Known Allergies   History of Present Illness    Barry Decker  is a 89 year old male with a PMH of nonobstructive CAD, HFpEF, HTN, HLD, rheumatic fever with aortic valve disease s/p TAVR 09/2017, LBBB, first-degree AVB,prior partial colectomy thoracic aortic aneurysm, multiple myeloma who presents today for 1 year follow-up.  [name] was initially seen by Dr. Clifton Decker in 2015 for evaluation and management of aortic stenosis.  He underwent 2D echo that showed normal LV function with moderate AAS (mean gradient of 29 mm).  He was followed by yearly echocardiograms.  He underwent echo in 06/2017 that showed worsened gradient of 38 mm with aortic root enlargement.  He had CT of the chest on 07/2017 that showed dilated ascending aorta at 4.8 cm.  He underwent successful TAVR in 09/2017.  2D echo was completed 10/2021 with EF of 60 to 65% with normal functioning AVR and trivial AI with mean gradient of 7 mmHg.  He underwent MRI of the chest that showed  stable ascending aorta with plan to repeat in 1 year.   [name] presents today with his wife for 1 year follow-up.  Since last being seen in the office patient reports that he has been doing well with no new cardiac complaints since his previous visit.  His blood pressure today is well-controlled at 110/62 and heart rate was 55 bpm.  He reports compliance with his current medications and denies any adverse reactions.  He is euvolemic on examination today. He is maintaining an active lifestyle to his tolerance level.  He is scheduled to undergo surveillance MRI of the chest to evaluate ascending aortic  aneurysm.  Patient denies chest pain, palpitations, dyspnea, PND, orthopnea, nausea, vomiting, dizziness, syncope, edema, weight gain, or early satiety.  Home Medications    Current Outpatient Medications  Medication Sig Dispense Refill   allopurinol (ZYLOPRIM) 300 MG tablet TAKE 1 TABLET BY MOUTH EVERY DAY 90 tablet 3   amLODipine (NORVASC) 10 MG tablet TAKE 1 TABLET BY MOUTH EVERY DAY 90 tablet 3   amoxicillin (AMOXIL) 500 MG capsule TAKE 4 CAPS BY MOUTH AS DIRECTED 1 HOUR PRIOR TO DENTAL WORK INCLUDING CLEANINGS (Patient not taking: Reported on 11/21/2022) 8 capsule 2   Ascorbic Acid (VITAMIN C) 1000 MG tablet Take 1,000 mg by mouth daily.     cyanocobalamin 1000 MCG tablet Take 1,000 mcg by mouth daily.     furosemide (LASIX) 20 MG tablet TAKE 1 TABLET BY MOUTH EVERY DAY 90 tablet 3   lisinopril (ZESTRIL) 20 MG tablet TAKE 1 TABLET BY MOUTH EVERY DAY 90 tablet 3   potassium citrate (UROCIT-K) 10 MEQ (1080 MG) SR tablet Take 1 tablet (10 mEq total) by mouth 3 (three) times daily with meals. 300 tablet 1   predniSONE (DELTASONE) 20 MG tablet Wait until x-ray read back. Take 1 tablet by mouth daily for 5 days, then 1/2 tablet daily for 2 days 6 tablet 0   rosuvastatin (CRESTOR) 20 MG tablet TAKE 1 TABLET BY MOUTH WEEKLY 13 tablet 3   warfarin (COUMADIN) 5 MG tablet TAKE 1 TO 1&1/2 TABLETS ONCE DAILY BY MOUTH AS DIRECTED BY ANTICOAGULATION CLINIC 130 tablet 0   No current facility-administered medications for this visit.     Review of Systems  Please see the history of present illness.    (+) Fatigue All other systems reviewed and are otherwise negative except as noted above.  Physical Exam    Wt Readings from Last 3 Encounters:  11/21/22 230 lb 9.6 oz (104.6 kg)  09/19/22 231 lb 12.8 oz (105.1 kg)  09/06/22 233 lb (105.7 kg)   ZO:XWRUE were no vitals filed for this visit.,There is no height or weight on file to calculate BMI.  Constitutional:      Appearance: Healthy appearance.  Not in distress.  Neck:     Vascular: JVD normal.  Pulmonary:     Effort: Pulmonary effort is normal.     Breath sounds: No wheezing. No rales. Diminished in the bases Cardiovascular:     Normal rate. Regular rhythm. Normal S1. Normal S2.      Murmurs:  Edema:    Peripheral edema absent.  Abdominal:     Palpations: Abdomen is soft non tender. There is no hepatomegaly.  Skin:    General: Skin is warm and dry.  Neurological:     General: No focal deficit present.     Mental Status: Alert and oriented to person, place and time.     Cranial Nerves:  Cranial nerves are intact.  EKG/LABS/ Recent Cardiac Studies    ECG personally reviewed by me today -sinus bradycardia with heart rate of 55 first-degree AVB and left axis deviation with LBBB and no acute changes consistent with previous EKG.  Cardiac Studies & Procedures   CARDIAC CATHETERIZATION  CARDIAC CATHETERIZATION 08/10/2017  Narrative  Mid RCA lesion is 20% stenosed.  Prox LAD lesion is 20% stenosed.  Ost 1st Diag lesion is 60% stenosed.  1. Mild non-obstructive disease in the LAD, Circumflex and RCA\ 2. Moderate stenosis small caliber Diagonal branch 3. Severe aortic stenosis (peak to peak gradient 33 mmHg, mean gradient 32 mmHg, AVA 1.37 cm2).  Recommendations: Will continue workup for AVR. I think we will likely end up pursuing TAVR but given his dilated aortic root, I want to have Dr. Laneta Simmers see him before we plan further testing.  Findings Coronary Findings Diagnostic  Dominance: Right  Left Anterior Descending Prox LAD lesion is 20% stenosed.  First Diagonal Branch Ost 1st Diag lesion is 60% stenosed.  Right Coronary Artery Vessel is large. Mid RCA lesion is 20% stenosed.  Intervention  No interventions have been documented.     ECHOCARDIOGRAM  ECHOCARDIOGRAM COMPLETE 11/02/2021  Narrative ECHOCARDIOGRAM REPORT    Patient Name:   CHANDEN STEAGALL Date of Exam: 11/02/2021 Medical Rec #:  [id number]       Height:       72.0 in Accession #:    [id number]     Weight:       230.0 lb Date of Birth:  1933-03-18       BSA:          2.261 m Patient Age:    88 years       BP:           108/60 mmHg Patient Gender: M              HR:           60 bpm. Exam Location:  [address]  Procedure: 2D Echo, 3D Echo, Cardiac Doppler, Color Doppler and Strain Analysis  Indications:    Z95.2 TAVR  History:        Patient has prior history of Echocardiogram examinations, most recent 12/01/2020. CHF; Risk Factors:Hypertension, Dyslipidemia and Former Smoker. Anemia. Aortic Valve: 29 mm Sapien prosthetic, stented (TAVR) valve is present in the aortic position. Procedure Date: 09/25/17.  Sonographer:    Jorje Guild BS, RDCS Referring Phys: [id number] CHRISTOPHER D MCALHANY  IMPRESSIONS   1. Left ventricular ejection fraction, by estimation, is 60 to 65%. Left ventricular ejection fraction by 3D volume is 63 %. The left ventricle has normal function. The left ventricle has no regional wall motion abnormalities. There is severe concentric left ventricular hypertrophy. Left ventricular diastolic parameters are consistent with Grade I diastolic dysfunction (impaired relaxation). The average left ventricular global longitudinal strain is -21.6 %. The global longitudinal strain is normal. 2. Right ventricular systolic function is normal. The right ventricular size is normal. There is normal pulmonary artery systolic pressure. The estimated right ventricular systolic pressure is 29.3 mmHg. 3. Left atrial size was severely dilated. 4. The mitral valve is normal in structure. Trivial mitral valve regurgitation. No evidence of mitral stenosis. 5. The aortic valve has been repaired/replaced. Aortic valve regurgitation is trivial. No aortic stenosis is present. There is a 29 mm Sapien prosthetic (TAVR) valve present in the aortic position. Procedure Date: 09/25/17. Aortic valve area, by VTI measures 3.62 cm. Aortic valve  mean  gradient measures 7.2 mmHg. Aortic valve Vmax measures 1.85 m/s. 6. Aortic dilatation noted. Aneurysm of the aortic root, measuring 49 mm. Aneurysm of the ascending aorta, measuring 49 mm. 7. The inferior vena cava is dilated in size with >50% respiratory variability, suggesting right atrial pressure of 8 mmHg. 8. Compared to prior echo, the mean TAVR gradient has decreased from to 7.57mmHg, DVI is normal at 0.70 and there is tirival perivalvular AI at 1pm on the PSAX view.  Comparison(s): 12/01/20 EF 55-60%. PA pressure . AV mean PG, peak PG.  FINDINGS Left Ventricle: Left ventricular ejection fraction, by estimation, is 60 to 65%. Left ventricular ejection fraction by 3D volume is 63 %. The left ventricle has normal function. The left ventricle has no regional wall motion abnormalities. The average left ventricular global longitudinal strain is -21.6 %. The global longitudinal strain is normal. The left ventricular internal cavity size was normal in size. There is severe concentric left ventricular hypertrophy. Abnormal (paradoxical) septal motion, consistent with left bundle branch block. Left ventricular diastolic parameters are consistent with Grade I diastolic dysfunction (impaired relaxation). Normal left ventricular filling pressure.  Right Ventricle: The right ventricular size is normal. No increase in right ventricular wall thickness. Right ventricular systolic function is normal. There is normal pulmonary artery systolic pressure. The tricuspid regurgitant velocity is 2.31 m/s, and with an assumed right atrial pressure of 8 mmHg, the estimated right ventricular systolic pressure is 29.3 mmHg.  Left Atrium: Left atrial size was severely dilated.  Right Atrium: Right atrial size was normal in size.  Pericardium: There is no evidence of pericardial effusion.  Mitral Valve: The mitral valve is normal in structure. Mild mitral annular calcification. Trivial mitral  valve regurgitation. No evidence of mitral valve stenosis.  Tricuspid Valve: The tricuspid valve is normal in structure. Tricuspid valve regurgitation is not demonstrated. No evidence of tricuspid stenosis.  Aortic Valve: The aortic valve has been repaired/replaced. Aortic valve regurgitation is trivial. No aortic stenosis is present. Aortic valve mean gradient measures 7.2 mmHg. Aortic valve peak gradient measures 13.7 mmHg. Aortic valve area, by VTI measures 3.62 cm. There is a 29 mm Sapien prosthetic, stented (TAVR) valve present in the aortic position. Procedure Date: 09/25/17.  Pulmonic Valve: The pulmonic valve was normal in structure. Pulmonic valve regurgitation is trivial. No evidence of pulmonic stenosis.  Aorta: Aortic dilatation noted. There is an aneurysm involving the aortic root measuring 49 mm. There is an aneurysm involving the ascending aorta measuring 49 mm.  Venous: The inferior vena cava is dilated in size with greater than 50% respiratory variability, suggesting right atrial pressure of 8 mmHg.  IAS/Shunts: No atrial level shunt detected by color flow Doppler.   LEFT VENTRICLE PLAX 2D LVIDd:         5.30 cm         Diastology LVIDs:         3.40 cm         LV e' medial:    5.22 cm/s LV PW:         1.50 cm         LV E/e' medial:  13.9 LV IVS:        1.60 cm         LV e' lateral:   6.53 cm/s LVOT diam:     2.50 cm         LV E/e' lateral: 11.1 LV SV:  128 LV SV Index:   56              2D LVOT Area:     4.91 cm        Longitudinal Strain 2D Strain GLS  -19.3 % (A2C): 2D Strain GLS  -20.2 % (A3C): 2D Strain GLS  -25.4 % (A4C): 2D Strain GLS  -21.6 % Avg:  3D Volume EF LV 3D EF:    Left ventricul ar ejection fraction by 3D volume is 63 %.  3D Volume EF: 3D EF:        63 % LV EDV:       156 ml LV ESV:       57 ml LV SV:        99 ml  RIGHT VENTRICLE             IVC RV Basal diam:  3.80 cm     IVC diam: 2.60 cm RV S prime:     12.90  cm/s TAPSE (M-mode): 2.3 cm RVSP:           29.3 mmHg  LEFT ATRIUM              Index        RIGHT ATRIUM           Index LA diam:        3.90 cm  1.72 cm/m   RA Pressure: 8.00 mmHg LA Vol (A2C):   129.0 ml 57.06 ml/m  RA Area:     14.90 cm LA Vol (A4C):   121.0 ml 53.52 ml/m  RA Volume:   28.80 ml  12.74 ml/m LA Biplane Vol: 126.0 ml 55.73 ml/m AORTIC VALVE AV Area (Vmax):    3.40 cm AV Area (Vmean):   3.15 cm AV Area (VTI):     3.62 cm AV Vmax:           185.00 cm/s AV Vmean:          124.250 cm/s AV VTI:            0.353 m AV Peak Grad:      13.7 mmHg AV Mean Grad:      7.2 mmHg LVOT Vmax:         128.00 cm/s LVOT Vmean:        79.700 cm/s LVOT VTI:          0.260 m LVOT/AV VTI ratio: 0.74  AORTA Ao Root diam: 4.90 cm Ao Asc diam:  4.70 cm  MITRAL VALVE                TRICUSPID VALVE TR Peak grad:   21.3 mmHg MV Decel Time: 289 msec     TR Vmax:        231.00 cm/s MV E velocity: 72.30 cm/s   Estimated RAP:  8.00 mmHg MV A velocity: 126.00 cm/s  RVSP:           29.3 mmHg MV E/A ratio:  0.57 SHUNTS Systemic VTI:  0.26 m Systemic Diam: 2.50 cm  Armanda Magic MD Electronically signed by Armanda Magic MD Signature Date/Time: 11/02/2021/1:09:19 PM    Final     CT SCANS  CT CORONARY MORPH W/CTA COR W/SCORE 05/22/2020  Addendum 05/22/2020  9:09 AM ADDENDUM REPORT: 05/22/2020 09:07  CLINICAL DATA:  Increased TAVR gradients/Concerns for HALT.  EXAM: Cardiac TAVR CT  TECHNIQUE: The patient was scanned on a [employer]. A 120 kV retrospective scan was triggered in  the descending thoracic aorta at 111 HU's. Gantry rotation speed was 250 msecs and collimation was .6 mm. No beta blockade or nitro were given. The 3D data set was reconstructed in 5% intervals of the R-R cycle. Systolic and diastolic phases were analyzed on a dedicated work station using MPR, MIP and VRT modes. The patient received 80 cc of contrast.  FINDINGS: Image quality:  Average. Cardiac phases were only obtained between 30-75%.  Noise artifact is: Moderate cardiac motion artifact.  Aortic Prosthesis: A 29 mm Edwards Sapien 3 TAVR is present in the aortic position. Implant date 09/25/2017. There is severe hypo-attenuating leaflet thickening (HALT) with severe hypo-attenuation affecting motion (HAM) of the leaflets in the RCC/LCC positions. No obvious paravalvular leak is detected.  Aortic root: Aneurysm up to 46 mm.  Sinotubular Junction: 41 mm with moderate atherosclerosis.  Ascending Thoracic Aorta: Aneurysm up to 48 mm (double oblique).  Coronary Arteries: Normal coronary origin. Right dominance. Coronary calcium score 1117 which is 66th percentile for age- and sex-matched controls. The study was performed without use of NTG and is insufficient for plaque evaluation. The following assessment was made:  Left main: The left main is a large caliber vessel with a normal take off from the left coronary cusp that bifurcates to form a left anterior descending artery and a left circumflex artery. There is minimal calcified plaque (<25%).  Left anterior descending artery: The LAD contains mild calcified plaque (25-49%).  Left circumflex artery: The LCX is non-dominant with minimal calcified plaque (<25%).  Right coronary artery: The RCA is dominant with normal take off from the right coronary cusp with minimal calcified plaque (<25%).  Cardiac Morphology:  Right Atrium: Right atrial size is within normal limits.  Right Ventricle: The right ventricular cavity is within normal limits.  Left Atrium: Left atrial size is normal in size with no left atrial appendage filling defect.  Left Ventricle: The ventricular cavity size is within normal limits. There are no stigmata of prior infarction. There is no abnormal filling defect.  Pulmonary arteries: Normal in size without proximal filling defect.  Pulmonary veins: Normal pulmonary venous  drainage.  Pericardium: Normal thickness with no significant effusion or calcium present.  Mitral Valve: The mitral valve is normal structure without significant calcification.  Extra-cardiac findings: See attached radiology report for non-cardiac structures.  IMPRESSION: 1. Limited study as only 30-75% of the cardiac phases were acquired and moderate cardiac motion artifact was present.  2. A 29 mm Edwards Sapien 3 TAVR is present in the aortic position with severe hypo-attenuating leaflet thickening (HALT) with severe hypo-attenuation affecting motion (HAM) of the leaflets in the RCC/LCC positions.  3. No obvious paravalvular leak is detected.  4. The ascending aorta is diffusely aneurysmal (aortic root up to 46 mm and the ascending aorta up to 48 mm).  5. Coronary calcium score 1117 which is 66th percentile for age- and sex-matched controls.  6. Mild non-obstructive calcified plaque (25-49%) in the LAD and minimal CAD (<25%) in the LCX/RCA.  Gerri Spore T. Flora Lipps, MD   Electronically Signed By: Lennie Odor On: 05/22/2020 09:07  Narrative EXAM: OVER-READ INTERPRETATION  CT CHEST  The following report is an over-read performed by radiologist Dr. Trudie Reed of [facility] on 05/21/2020. This over-read does not include interpretation of cardiac or coronary anatomy or pathology. The coronary calcium score/coronary CTA interpretation by the cardiologist is attached.  COMPARISON:  Chest CTA 05/17/2020.  FINDINGS: Aortic atherosclerosis with aneurysmal dilatation of the ascending thoracic  aorta (4.7 cm in diameter). 7 x 3 mm pulmonary nodule in the periphery of the left lower lobe (axial image 55 of series 15), similar to prior examinations dating back to 07/23/2017, considered benign. Calcified granuloma in the posterior aspect of the left upper lobe. Within the visualized portions of the thorax there are no suspicious appearing pulmonary nodules or  masses, there is no acute consolidative airspace disease, no pleural effusions, no pneumothorax and no lymphadenopathy. Visualized portions of the upper abdomen are unremarkable. There are no aggressive appearing lytic or blastic lesions noted in the visualized portions of the skeleton.  IMPRESSION: 1. Aneurysmal dilatation of the ascending thoracic aorta (4.7 cm in diameter), similar to prior examinations. Ascending thoracic aortic aneurysm. Recommend semi-annual imaging followup by CTA or MRA and referral to cardiothoracic surgery if not already obtained. This recommendation follows 2010 ACCF/AHA/AATS/ACR/ASA/SCA/SCAI/SIR/STS/SVM Guidelines for the Diagnosis and Management of Patients With Thoracic Aortic Disease. Circulation. 2010; 121: Z610-R604. Aortic aneurysm NOS (ICD10-I71.9). 2. Aortic Atherosclerosis (ICD10-I70.0).  Electronically Signed: By: Trudie Reed M.D. On: 05/21/2020 11:21   CT SCANS  CT CORONARY MORPH W/CTA COR W/SCORE 09/05/2017  Addendum 09/05/2017  9:40 AM ADDENDUM REPORT: 09/05/2017 09:37  CLINICAL DATA:  89 year old male with severe aortic stenosis being evaluated for a TAVR procedure.  EXAM: Cardiac TAVR CT  TECHNIQUE: The patient was scanned on a [employer]. A 120 kV retrospective scan was triggered in the descending thoracic aorta at 111 HU's. Gantry rotation speed was 250 msecs and collimation was .6 mm. No beta blockade or nitro were given. The 3D data set was reconstructed in 5% intervals of the R-R cycle. Systolic and diastolic phases were analyzed on a dedicated work station using MPR, MIP and VRT modes. The patient received 80 cc of contrast.  FINDINGS: Aortic Valve: Trileaflet aortic valve with moderately thickened and calcified leaflets and severely restricted leaflet opening. No calcifications are extending into the LVOT.  Aorta: There is moderate ascending aortic aneurysm and also dilated aortic arch and descending  thoracic aorta. Mild calcifications, no dissection.  Sinotubular Junction: 41 x 40 mm  Ascending Thoracic Aorta: 47 x 45 mm  Aortic Arch: 37 x 36 mm  Descending Thoracic Aorta: 36 x 34 mm  Sinus of Valsalva Measurements:  Non-coronary: 46 mm  Right -coronary: 44 mm  Left -coronary: 45 mm  Sinus of Valsalva Height:  Right -coronary: 22 mm  Left -coronary: 21 mm  Coronary Artery Height above Annulus:  Left Main: 14 mm  Right Coronary: 14 mm  Virtual Basal Annulus Measurements:  Maximum/Minimum Diameter: 32.7 x 25 mm  Mean Diameter: 28.6 mm  Perimeter: 92.4 mm  Area: 644 mm2  Optimum Fluoroscopic Angle for Delivery: LAO 13 CAU 6  IMPRESSION: 1. Trileaflet aortic valve with moderately thickened and calcified leaflets and severely restricted leaflet opening. Annular measurements suitable for delivery of a 29 mm Edwards SAPIEN 3 valve or a 34 mm [employer] Evolut R TAVR valve.  2. Sufficient coronary to annulus distance.  3. Optimum Fluoroscopic Angle for Delivery:  LAO 13 CAU 6  4. No thrombus in the left atrial appendage.  5. There is moderately dilated ascending aortic aneurysm as well as mildly dilated aortic arch and descending thoracic aorta.  6. Dilated pulmonary artery measuring 36 mm suggestive of pulmonary hypertension.   Electronically Signed By: Tobias Alexander On: 09/05/2017 09:37  Narrative EXAM: OVER-READ INTERPRETATION  CT CHEST  The following report is an over-read performed by radiologist Dr. Trudie Reed of  [facility] on 09/04/2017. This over-read does not include interpretation of cardiac or coronary anatomy or pathology. The coronary calcium score/coronary CTA interpretation by the cardiologist is attached.  COMPARISON:  None.  FINDINGS: Aortic atherosclerosis with aneurysmal dilatation of the ascending thoracic aorta which measures up to 4.7 cm. Calcified granuloma in the posterior aspect of the left  upper lobe incidentally noted. Within the visualized portions of the thorax there are no suspicious appearing pulmonary nodules or masses, there is no acute consolidative airspace disease, no pleural effusions, no pneumothorax and no lymphadenopathy. Visualized portions of the upper abdomen are unremarkable. There are no aggressive appearing lytic or blastic lesions noted in the visualized portions of the skeleton.  IMPRESSION: 1. Aortic atherosclerosis with aneurysmal dilatation of ascending thoracic aorta which measures up to 4.7 cm in diameter. Ascending thoracic aortic aneurysm. Recommend semi-annual imaging followup by CTA or MRA and referral to cardiothoracic surgery if not already obtained. This recommendation follows 2010 ACCF/AHA/AATS/ACR/ASA/SCA/SCAI/SIR/STS/SVM Guidelines for the Diagnosis and Management of Patients With Thoracic Aortic Disease. Circulation. 2010; 121: J191-Y782.  Aortic Atherosclerosis (ICD10-I70.0). Aortic aneurysm NOS (ICD10-I71.9).  Electronically Signed: By: Trudie Reed M.D. On: 09/04/2017 08:21         Lab Results  Component Value Date   WBC 4.4 09/06/2022   HGB 11.5 (L) 09/06/2022   HCT 35.2 (L) 09/06/2022   MCV 104.8 (H) 09/06/2022   PLT 209 09/06/2022   Lab Results  Component Value Date   CREATININE 1.16 09/06/2022   BUN 22 09/06/2022   NA 140 09/06/2022   K 4.2 09/06/2022   CL 106 09/06/2022   CO2 30 09/06/2022   Lab Results  Component Value Date   ALT 12 09/06/2022   AST 15 09/06/2022   ALKPHOS 70 09/06/2022   BILITOT 0.4 09/06/2022   Lab Results  Component Value Date   CHOL 162 04/04/2022   HDL 53.90 04/04/2022   LDLCALC 84 04/04/2022   LDLDIRECT 99.0 11/04/2021   TRIG 121.0 04/04/2022   CHOLHDL 3 04/04/2022    Lab Results  Component Value Date   HGBA1C 5.2 09/21/2017     Assessment & Plan    1.  History of AVR repair: -s/p TAVR 09/2017 with most recent 2D echo completed 10/2021 with normal valve  gradients and dilated aortic root followed by chest MRA. -Today patient reports no chest pain or shortness of breath with activity. -Continue Coumadin for Coumadin clinic -Continue Lasix 20 mg daily  2.  Ascending aortic aneurysm: -Stable by previous MRA and 2023 -Annual MRA scheduled for surveillance -Continue lisinopril 20 mg and Crestor 20 mg, Norvasc 10 mg -BMET today  3.  Hyperlipidemia: -Patient's LDL cholesterol was 84 -Continue Crestor 20 mg daily  4.  History of multiple myeloma: -Currently followed by oncology  5.  Essential hypertension: -Patient's blood pressure today was 110/62 -Continue Norvasc 10 mg and Zestril 20 mg daily  Disposition: Follow-up with Barry Carrow, MD or APP in 12 months    Medication Adjustments/Labs and Tests Ordered: Current medicines are reviewed at length with the patient today.  Concerns regarding medicines are outlined above.   Signed, Napoleon Form, Leodis Rains, NP 11/21/2022, 7:11 PM [facility]

## 2022-11-22 ENCOUNTER — Ambulatory Visit: Payer: PPO | Attending: Nurse Practitioner | Admitting: Nurse Practitioner

## 2022-11-22 ENCOUNTER — Other Ambulatory Visit: Payer: Self-pay | Admitting: Cardiovascular Disease

## 2022-11-22 ENCOUNTER — Encounter: Payer: Self-pay | Admitting: Nurse Practitioner

## 2022-11-22 VITALS — BP 110/62 | HR 55 | Ht 72.0 in | Wt 232.0 lb

## 2022-11-22 DIAGNOSIS — Z953 Presence of xenogenic heart valve: Secondary | ICD-10-CM | POA: Diagnosis not present

## 2022-11-22 DIAGNOSIS — E7800 Pure hypercholesterolemia, unspecified: Secondary | ICD-10-CM | POA: Diagnosis not present

## 2022-11-22 DIAGNOSIS — I5032 Chronic diastolic (congestive) heart failure: Secondary | ICD-10-CM | POA: Diagnosis not present

## 2022-11-22 DIAGNOSIS — Z5181 Encounter for therapeutic drug level monitoring: Secondary | ICD-10-CM

## 2022-11-22 DIAGNOSIS — I7121 Aneurysm of the ascending aorta, without rupture: Secondary | ICD-10-CM

## 2022-11-22 DIAGNOSIS — I10 Essential (primary) hypertension: Secondary | ICD-10-CM | POA: Diagnosis not present

## 2022-11-22 NOTE — Patient Instructions (Addendum)
Medication Instructions:  Your physician recommends that you continue on your current medications as directed. Please refer to the Current Medication list given to you today. *If you need a refill on your cardiac medications before your next appointment, please call your pharmacy*   Lab Work: None Ordered If you have labs (blood work) drawn today and your tests are completely normal, you will receive your results only by: MyChart Message (if you have MyChart) OR A paper copy in the mail If you have any lab test that is abnormal or we need to change your treatment, we will call you to review the results.   Testing/Procedures: Please schedule MR Angio Chest W WO Contrast  Follow-Up: At [facility], you and your health needs are our priority.  As part of our continuing mission to provide you with exceptional heart care, we have created designated Provider Care Teams.  These Care Teams include your primary Cardiologist (physician) and Advanced Practice Providers (APPs -  Physician Assistants and Nurse Practitioners) who all work together to provide you with the care you need, when you need it.  We recommend signing up for the patient portal called "MyChart".  Sign up information is provided on this After Visit Summary.  MyChart is used to connect with patients for Virtual Visits (Telemedicine).  Patients are able to view lab/test results, encounter notes, upcoming appointments, etc.  Non-urgent messages can be sent to your provider as well.   To learn more about what you can do with MyChart, go to [email or web address].    Your next appointment:   12 month(s)  Provider:   Verne Carrow, MD     Other Instructions

## 2022-11-23 LAB — BASIC METABOLIC PANEL
BUN/Creatinine Ratio: 20 (ref 10–24)
BUN: 23 mg/dL (ref 8–27)
CO2: 25 mmol/L (ref 20–29)
Calcium: 9.8 mg/dL (ref 8.6–10.2)
Chloride: 102 mmol/L (ref 96–106)
Creatinine, Ser: 1.14 mg/dL (ref 0.76–1.27)
Glucose: 84 mg/dL (ref 70–99)
Potassium: 5 mmol/L (ref 3.5–5.2)
Sodium: 139 mmol/L (ref 134–144)
eGFR: 62 mL/min/{1.73_m2} (ref >59)

## 2022-11-28 NOTE — Telephone Encounter (Signed)
Patient's wife states the Prednisone has not been effective.  Requests MRI with Contrast be ordered for Patient.  Requests to be advised by MyChart message and phone call [phone]-home).

## 2022-11-29 ENCOUNTER — Ambulatory Visit (HOSPITAL_COMMUNITY): Payer: PPO

## 2022-11-29 ENCOUNTER — Telehealth: Payer: Self-pay | Admitting: *Deleted

## 2022-11-29 ENCOUNTER — Encounter (HOSPITAL_COMMUNITY): Payer: Self-pay

## 2022-11-29 NOTE — Telephone Encounter (Signed)
[name] called to report Barry Decker has had lower back pain rated 8/9 since late May. PCP did xray of spine and some spinal arthritis was noted. Put him on course of Prednisone and this did not help. Having a MRI lumbar spine on 7/20 and wanted Dr. Truett Perna aware. Was told he is on wait list for cancellation. He is willing to go anywhere for the MRI and does not need the open MRI machine. Currently scheduled for labs at [facility] on 6/24 for myeloma f/u. Sees Dr. Truett Perna on 7/01.

## 2022-11-30 ENCOUNTER — Telehealth: Payer: Self-pay | Admitting: *Deleted

## 2022-11-30 NOTE — Telephone Encounter (Signed)
Called Barry Decker to inquire if he is having any neuro issues, such as weakness or loss of sensation in legs, difficulty with bowel or bladder control. He reports "no". MRI was moved up to 6/17 at Dr. Kalman Drape request to [facility] and Dr. Durene Cal made aware. Rescheduled his labs from 6/24 to 6/17 as well.

## 2022-12-04 ENCOUNTER — Inpatient Hospital Stay: Payer: PPO | Attending: Oncology

## 2022-12-04 ENCOUNTER — Ambulatory Visit (HOSPITAL_COMMUNITY)
Admission: RE | Admit: 2022-12-04 | Discharge: 2022-12-04 | Disposition: A | Discharge status: Home / Self Care | Payer: PPO | Source: Ambulatory Visit | Attending: Family Medicine | Admitting: Family Medicine

## 2022-12-04 DIAGNOSIS — D7589 Other specified diseases of blood and blood-forming organs: Secondary | ICD-10-CM | POA: Diagnosis not present

## 2022-12-04 DIAGNOSIS — C9000 Multiple myeloma not having achieved remission: Secondary | ICD-10-CM | POA: Insufficient documentation

## 2022-12-04 DIAGNOSIS — M545 Low back pain, unspecified: Secondary | ICD-10-CM | POA: Insufficient documentation

## 2022-12-04 DIAGNOSIS — D539 Nutritional anemia, unspecified: Secondary | ICD-10-CM | POA: Diagnosis not present

## 2022-12-04 DIAGNOSIS — D649 Anemia, unspecified: Secondary | ICD-10-CM

## 2022-12-04 DIAGNOSIS — M5126 Other intervertebral disc displacement, lumbar region: Secondary | ICD-10-CM | POA: Diagnosis not present

## 2022-12-04 DIAGNOSIS — M48061 Spinal stenosis, lumbar region without neurogenic claudication: Secondary | ICD-10-CM | POA: Diagnosis not present

## 2022-12-04 LAB — CMP (CANCER CENTER ONLY)
ALT: 11 U/L (ref 0–44)
AST: 11 U/L — ABNORMAL LOW (ref 15–41)
Albumin: 3.6 g/dL (ref 3.5–5.0)
Alkaline Phosphatase: 61 U/L (ref 38–126)
Anion gap: 6 (ref 5–15)
BUN: 21 mg/dL (ref 8–23)
CO2: 26 mmol/L (ref 22–32)
Calcium: 9.1 mg/dL (ref 8.9–10.3)
Chloride: 106 mmol/L (ref 98–111)
Creatinine: 1.09 mg/dL (ref 0.61–1.24)
GFR, Estimated: >60 mL/min (ref >60)
Glucose, Bld: 98 mg/dL (ref 70–99)
Potassium: 4.3 mmol/L (ref 3.5–5.1)
Sodium: 138 mmol/L (ref 135–145)
Total Bilirubin: 0.5 mg/dL (ref 0.3–1.2)
Total Protein: 6.2 g/dL — ABNORMAL LOW (ref 6.5–8.1)

## 2022-12-04 LAB — CBC WITH DIFFERENTIAL (CANCER CENTER ONLY)
Abs Immature Granulocytes: 0.01 10*3/uL (ref 0.00–0.07)
Basophils Absolute: 0 10*3/uL (ref 0.0–0.1)
Basophils Relative: 0 %
Eosinophils Absolute: 0.1 10*3/uL (ref 0.0–0.5)
Eosinophils Relative: 1 %
HCT: 32.5 % — ABNORMAL LOW (ref 39.0–52.0)
Hemoglobin: 11.1 g/dL — ABNORMAL LOW (ref 13.0–17.0)
Immature Granulocytes: 0 %
Lymphocytes Relative: 20 %
Lymphs Abs: 1 10*3/uL (ref 0.7–4.0)
MCH: 35.1 pg — ABNORMAL HIGH (ref 26.0–34.0)
MCHC: 34.2 g/dL (ref 30.0–36.0)
MCV: 102.8 fL — ABNORMAL HIGH (ref 80.0–100.0)
Monocytes Absolute: 0.5 10*3/uL (ref 0.1–1.0)
Monocytes Relative: 9 %
Neutro Abs: 3.4 10*3/uL (ref 1.7–7.7)
Neutrophils Relative %: 70 %
Platelet Count: 182 10*3/uL (ref 150–400)
RBC: 3.16 MIL/uL — ABNORMAL LOW (ref 4.22–5.81)
RDW: 15 % (ref 11.5–15.5)
WBC Count: 5 10*3/uL (ref 4.0–10.5)
nRBC: 0 % (ref 0.0–0.2)

## 2022-12-04 MED ORDER — GADOBUTROL 1 MMOL/ML IV SOLN
10.0000 mL | Freq: Once | INTRAVENOUS | Status: AC | PRN
  Administered 2022-12-04: 10 mL via INTRAVENOUS

## 2022-12-05 LAB — KAPPA/LAMBDA LIGHT CHAINS
Kappa free light chain: 136.2 mg/L — ABNORMAL HIGH (ref 3.3–19.4)
Kappa, lambda light chain ratio: 9.08 — ABNORMAL HIGH (ref 0.26–1.65)
Lambda free light chains: 15 mg/L (ref 5.7–26.3)

## 2022-12-08 LAB — MULTIPLE MYELOMA PANEL, SERUM
Albumin SerPl Elph-Mcnc: 3.3 g/dL (ref 2.9–4.4)
Albumin/Glob SerPl: 1.4 (ref 0.7–1.7)
Alpha 1: 0.2 g/dL (ref 0.0–0.4)
Alpha2 Glob SerPl Elph-Mcnc: 0.5 g/dL (ref 0.4–1.0)
B-Globulin SerPl Elph-Mcnc: 1.1 g/dL (ref 0.7–1.3)
Gamma Glob SerPl Elph-Mcnc: 0.6 g/dL (ref 0.4–1.8)
Globulin, Total: 2.4 g/dL (ref 2.2–3.9)
IgA: 500 mg/dL — ABNORMAL HIGH (ref 61–437)
IgG (Immunoglobin G), Serum: 747 mg/dL (ref 603–1613)
IgM (Immunoglobulin M), Srm: 46 mg/dL (ref 15–143)
M Protein SerPl Elph-Mcnc: 0.2 g/dL — ABNORMAL HIGH
Total Protein ELP: 5.7 g/dL — ABNORMAL LOW (ref 6.0–8.5)

## 2022-12-11 ENCOUNTER — Inpatient Hospital Stay: Payer: PPO

## 2022-12-18 ENCOUNTER — Inpatient Hospital Stay: Payer: PPO | Admitting: Oncology

## 2022-12-18 ENCOUNTER — Telehealth: Payer: Self-pay | Admitting: Oncology

## 2022-12-18 ENCOUNTER — Other Ambulatory Visit: Payer: PPO

## 2022-12-18 ENCOUNTER — Ambulatory Visit: Payer: PPO | Attending: Cardiology

## 2022-12-18 DIAGNOSIS — Z79899 Other long term (current) drug therapy: Secondary | ICD-10-CM | POA: Diagnosis not present

## 2022-12-18 DIAGNOSIS — Z5181 Encounter for therapeutic drug level monitoring: Secondary | ICD-10-CM

## 2022-12-18 DIAGNOSIS — I8290 Acute embolism and thrombosis of unspecified vein: Secondary | ICD-10-CM | POA: Diagnosis not present

## 2022-12-18 LAB — POCT INR: INR: 2.1 (ref 2.0–3.0)

## 2022-12-18 NOTE — Telephone Encounter (Signed)
Spoke with patient confirming upcoming appointment change  

## 2022-12-18 NOTE — Patient Instructions (Signed)
Continue taking warfarin 1.5 tablets daily except for 1 tablet on Mondays and Fridays. -Recheck INR in 6 weeks.  Coumadin Clinic [phone], Main number [phone].

## 2022-12-19 ENCOUNTER — Inpatient Hospital Stay: Payer: PPO | Attending: Oncology | Admitting: Nurse Practitioner

## 2022-12-19 ENCOUNTER — Encounter: Payer: Self-pay | Admitting: Nurse Practitioner

## 2022-12-19 ENCOUNTER — Telehealth: Payer: Self-pay | Admitting: Oncology

## 2022-12-19 VITALS — BP 127/59 | HR 63 | Temp 98.2°F | Resp 18 | Ht 72.0 in | Wt 231.8 lb

## 2022-12-19 DIAGNOSIS — C9000 Multiple myeloma not having achieved remission: Secondary | ICD-10-CM | POA: Insufficient documentation

## 2022-12-19 DIAGNOSIS — E785 Hyperlipidemia, unspecified: Secondary | ICD-10-CM | POA: Insufficient documentation

## 2022-12-19 DIAGNOSIS — I712 Thoracic aortic aneurysm, without rupture, unspecified: Secondary | ICD-10-CM | POA: Insufficient documentation

## 2022-12-19 DIAGNOSIS — E538 Deficiency of other specified B group vitamins: Secondary | ICD-10-CM | POA: Insufficient documentation

## 2022-12-19 DIAGNOSIS — D539 Nutritional anemia, unspecified: Secondary | ICD-10-CM | POA: Insufficient documentation

## 2022-12-19 DIAGNOSIS — I10 Essential (primary) hypertension: Secondary | ICD-10-CM | POA: Insufficient documentation

## 2022-12-19 NOTE — Progress Notes (Signed)
[facility] OFFICE PROGRESS NOTE   Diagnosis: Multiple myeloma  INTERVAL HISTORY:   [name] returns prior to scheduled follow-up to discuss the increased back pain he is experiencing and recent results of a lumbar spine MRI.  He reports back problems his "whole life".  Back pain flared up about 3 weeks ago, low back, focal, similar to pain he has experienced in the past.  The pain is now "easing off".  No leg weakness or numbness.  No bowel or bladder dysfunction.  No fever.  No recent infection.  Objective:  Vital signs in last 24 hours:  Blood pressure (!) 127/59, pulse 63, temperature 98.2 F (36.8 C), temperature source Oral, resp. rate 18, height 6' (1.829 m), weight 231 lb 12.8 oz (105.1 kg), SpO2 98 %.    HEENT: No thrush or ulcers. Resp: Lungs clear bilaterally. Cardio: Regular rate and rhythm. GI: Abdomen soft and nontender.  No hepatosplenomegaly. Vascular: No leg edema. Neuro: Lower extremity motor strength 5/5.   Lab Results:  Lab Results  Component Value Date   WBC 5.0 12/04/2022   HGB 11.1 (L) 12/04/2022   HCT 32.5 (L) 12/04/2022   MCV 102.8 (H) 12/04/2022   PLT 182 12/04/2022   NEUTROABS 3.4 12/04/2022    Imaging:  No results found.  Medications: I have reviewed the patient's current medications.  Assessment/Plan: Red cell macrocytosis, chronic Multiple myeloma Mild macrocytic anemia Bone marrow biopsy 08/24/2022-hypercellular with increased number of plasma cells representing 15% of all cells in the aspirate associated with interstitial infiltrates and small clusters in the clot and biopsy sections.  Plasma cells show kappa light chain restriction consistent with plasma cell neoplasm.  Background shows trilineage hematopoiesis with nonspecific changes.  Normal cytogenetics.  FISH with no evidence of abnormalities. IgA kappa monoclonal protein, elevated free kappa light chains 09/06/2022 bone survey-no radiographic evidence of  multiple myeloma 12/04/2022 myeloma labs stable to improved Mild leukopenia on CBC 09/27/2020, normal on 10/18/2020, 06/21/2021, 201 2024 History of B12 deficiency Hypertension Hyperlipidemia Severe AS status post TAVR April 2019 Nonobstructive CAD Thoracic aortic aneurysm Back pain-MRI lumbar spine 12/04/2022 with mildly progressive foraminal narrowing bilaterally at L4-5 and on the left at L5-S1 and S1-2; stable enhancing lesion posteriorly L4 vertebral body; additional small foci of enhancement within the lumbar spine and upper sacrum bilaterally indeterminate for recurrent/progressive myeloma; no evidence of pathologic fracture.    Disposition: Mr. Ogando has early multiple myeloma currently being followed on an observation approach.  He has stable mild anemia, no other indications that the myeloma is progressing including stable to improved IgA, light chains, M spike.  The recent MRI shows a stable enhancing lesion at L4, also present on an MRI from 2008, indeterminate small foci of enhancement at the lumbar spine and upper sacrum.  It is unlikely the pain he is experiencing is related to myeloma.  He has chronic low back pain with the recent increase being similar to pain he has experienced in the past, now improving.  He will follow-up with Dr. Durene Cal if the pain does not continue to improve.  He will return for follow-up here with repeat myeloma labs in 4 months.  We are available to see him sooner if needed.  Patient seen with Dr. Truett Perna.    Lonna Cobb ANP/GNP-BC   12/19/2022  10:14 AM This was a shared visit with Lonna Cobb.  We reviewed the lumbar MRI and laboratory findings with Mr. Ransier.  We suspect the lower back pain  is unrelated to multiple myeloma.  There is no laboratory evidence for progression of the myeloma. Plan is to continue observation.  Mancel Bale, MD

## 2022-12-25 ENCOUNTER — Telehealth: Payer: Self-pay | Admitting: Family Medicine

## 2022-12-25 NOTE — Telephone Encounter (Signed)
Pt would like a call back concerning medical records that they had talked to [place] about. Please advise.

## 2022-12-26 NOTE — Telephone Encounter (Signed)
Called and spoke with pt and made aware I have faxed records electronically to [facility].

## 2022-12-29 DIAGNOSIS — M545 Low back pain, unspecified: Secondary | ICD-10-CM | POA: Diagnosis not present

## 2023-01-02 DIAGNOSIS — M545 Low back pain, unspecified: Secondary | ICD-10-CM | POA: Diagnosis not present

## 2023-01-06 ENCOUNTER — Other Ambulatory Visit: Payer: PPO

## 2023-01-09 ENCOUNTER — Other Ambulatory Visit: Payer: Self-pay | Admitting: Family Medicine

## 2023-01-16 ENCOUNTER — Other Ambulatory Visit: Payer: Self-pay | Admitting: Cardiovascular Disease

## 2023-01-29 ENCOUNTER — Ambulatory Visit: Payer: PPO | Attending: Cardiology

## 2023-01-29 DIAGNOSIS — Z5181 Encounter for therapeutic drug level monitoring: Secondary | ICD-10-CM | POA: Diagnosis not present

## 2023-01-29 DIAGNOSIS — I8290 Acute embolism and thrombosis of unspecified vein: Secondary | ICD-10-CM

## 2023-01-29 LAB — POCT INR: INR: 2.4 (ref 2.0–3.0)

## 2023-01-29 NOTE — Patient Instructions (Signed)
Description   Continue taking warfarin 1.5 tablets daily except for 1 tablet on Mondays and Fridays. -Recheck INR in 6 weeks.  Coumadin Clinic [phone], Main number [phone].

## 2023-02-08 ENCOUNTER — Other Ambulatory Visit: Payer: Self-pay | Admitting: Family Medicine

## 2023-02-08 ENCOUNTER — Other Ambulatory Visit: Payer: Self-pay | Admitting: Cardiovascular Disease

## 2023-02-08 DIAGNOSIS — Z5181 Encounter for therapeutic drug level monitoring: Secondary | ICD-10-CM

## 2023-03-12 ENCOUNTER — Ambulatory Visit: Payer: PPO | Attending: Cardiology

## 2023-03-12 DIAGNOSIS — Z5181 Encounter for therapeutic drug level monitoring: Secondary | ICD-10-CM

## 2023-03-12 DIAGNOSIS — I8290 Acute embolism and thrombosis of unspecified vein: Secondary | ICD-10-CM | POA: Diagnosis not present

## 2023-03-12 LAB — POCT INR: INR: 2 (ref 2.0–3.0)

## 2023-03-12 NOTE — Patient Instructions (Signed)
Description   Take 1.5 tablets today and then continue taking warfarin 1.5 tablets daily except for 1 tablet on Mondays and Fridays. -Recheck INR in 6 weeks.  Coumadin Clinic [phone], Main number [phone].

## 2023-03-26 ENCOUNTER — Telehealth: Payer: Self-pay | Admitting: Family Medicine

## 2023-03-26 NOTE — Telephone Encounter (Signed)
Patient called wanting to know when she had last pneumonia and flu vaccine. Patient wants to know if okay to get flu vaccine and covid vaccine.  Please advise.

## 2023-03-26 NOTE — Telephone Encounter (Signed)
Called and lm for pt tcb. Pt had both flu and Pneumonia vaccines in 2023. Ok to get COVID and flu vaccines this fall.

## 2023-03-30 ENCOUNTER — Other Ambulatory Visit: Payer: Self-pay | Admitting: Family Medicine

## 2023-04-23 ENCOUNTER — Ambulatory Visit: Payer: PPO | Attending: Cardiovascular Disease

## 2023-04-23 DIAGNOSIS — I8290 Acute embolism and thrombosis of unspecified vein: Secondary | ICD-10-CM

## 2023-04-23 DIAGNOSIS — Z5181 Encounter for therapeutic drug level monitoring: Secondary | ICD-10-CM

## 2023-04-23 LAB — POCT INR: INR: 3.9 — AB (ref 2.0–3.0)

## 2023-04-23 NOTE — Patient Instructions (Signed)
Description   HOLD tomorrow's dose and then continue taking warfarin 1.5 tablets daily except for 1 tablet on Mondays and Fridays. Recheck INR in 4 weeks.  C[facility]madin Clinic [phone], Main number [phone].

## 2023-04-24 ENCOUNTER — Inpatient Hospital Stay: Payer: PPO | Attending: Oncology

## 2023-04-24 ENCOUNTER — Inpatient Hospital Stay: Payer: PPO | Admitting: Oncology

## 2023-04-24 VITALS — BP 120/85 | HR 56 | Temp 98.2°F | Resp 20 | Ht 72.0 in | Wt 234.0 lb

## 2023-04-24 DIAGNOSIS — C9000 Multiple myeloma not having achieved remission: Secondary | ICD-10-CM | POA: Diagnosis not present

## 2023-04-24 DIAGNOSIS — D709 Neutropenia, unspecified: Secondary | ICD-10-CM | POA: Diagnosis not present

## 2023-04-24 LAB — CMP (CANCER CENTER ONLY)
ALT: 18 U/L (ref 0–44)
AST: 18 U/L (ref 15–41)
Albumin: 3.8 g/dL (ref 3.5–5.0)
Alkaline Phosphatase: 69 U/L (ref 38–126)
Anion gap: 7 (ref 5–15)
BUN: 27 mg/dL — ABNORMAL HIGH (ref 8–23)
CO2: 28 mmol/L (ref 22–32)
Calcium: 9.4 mg/dL (ref 8.9–10.3)
Chloride: 104 mmol/L (ref 98–111)
Creatinine: 1.1 mg/dL (ref 0.61–1.24)
GFR, Estimated: >60 mL/min (ref >60)
Glucose, Bld: 85 mg/dL (ref 70–99)
Potassium: 4.2 mmol/L (ref 3.5–5.1)
Sodium: 139 mmol/L (ref 135–145)
Total Bilirubin: 0.4 mg/dL (ref <1.2)
Total Protein: 6.6 g/dL (ref 6.5–8.1)

## 2023-04-24 LAB — CBC WITH DIFFERENTIAL (CANCER CENTER ONLY)
Abs Immature Granulocytes: 0.03 10*3/uL (ref 0.00–0.07)
Basophils Absolute: 0 10*3/uL (ref 0.0–0.1)
Basophils Relative: 1 %
Eosinophils Absolute: 0.1 10*3/uL (ref 0.0–0.5)
Eosinophils Relative: 2 %
HCT: 34 % — ABNORMAL LOW (ref 39.0–52.0)
Hemoglobin: 11.3 g/dL — ABNORMAL LOW (ref 13.0–17.0)
Immature Granulocytes: 1 %
Lymphocytes Relative: 46 %
Lymphs Abs: 1.3 10*3/uL (ref 0.7–4.0)
MCH: 36 pg — ABNORMAL HIGH (ref 26.0–34.0)
MCHC: 33.2 g/dL (ref 30.0–36.0)
MCV: 108.3 fL — ABNORMAL HIGH (ref 80.0–100.0)
Monocytes Absolute: 0.3 10*3/uL (ref 0.1–1.0)
Monocytes Relative: 9 %
Neutro Abs: 1.2 10*3/uL — ABNORMAL LOW (ref 1.7–7.7)
Neutrophils Relative %: 41 %
Platelet Count: 184 10*3/uL (ref 150–400)
RBC: 3.14 MIL/uL — ABNORMAL LOW (ref 4.22–5.81)
RDW: 15.8 % — ABNORMAL HIGH (ref 11.5–15.5)
WBC Count: 2.9 10*3/uL — ABNORMAL LOW (ref 4.0–10.5)
nRBC: 0 % (ref 0.0–0.2)

## 2023-04-24 NOTE — Progress Notes (Signed)
  [facility] OFFICE PROGRESS NOTE   Diagnosis: Multiple myeloma  INTERVAL HISTORY:   Barry Decker returns as scheduled.  He has intermittent back pain.  No recent infection.  Good appetite.  No new complaint.  Objective:  Vital signs in last 24 hours:  Blood pressure 120/85, pulse (!) 56, temperature 98.2 F (36.8 C), resp. rate 20, height 6' (1.829 m), weight 234 lb (106.1 kg), SpO2 96%.    Lymphatics: No cervical, supraclavicular, axillary, or inguinal nodes Resp: Lungs clear bilaterally Cardio: Regular rate and rhythm GI: No hepatosplenomegaly Vascular: Trace lower leg edema bilaterally   Lab Results:  Lab Results  Component Value Date   WBC 2.9 (L) 04/24/2023   HGB 11.3 (L) 04/24/2023   HCT 34.0 (L) 04/24/2023   MCV 108.3 (H) 04/24/2023   PLT 184 04/24/2023   NEUTROABS 1.2 (L) 04/24/2023    CMP  Lab Results  Component Value Date   NA 139 04/24/2023   K 4.2 04/24/2023   CL 104 04/24/2023   CO2 28 04/24/2023   GLUCOSE 85 04/24/2023   BUN 27 (H) 04/24/2023   CREATININE 1.10 04/24/2023   CALCIUM 9.4 04/24/2023   PROT 6.6 04/24/2023   ALBUMIN 3.8 04/24/2023   AST 18 04/24/2023   ALT 18 04/24/2023   ALKPHOS 69 04/24/2023   BILITOT 0.4 04/24/2023   GFRNONAA >60 04/24/2023   GFRAA 70 05/11/2020     Medications: I have reviewed the patient's current medications.   Assessment/Plan: Red cell macrocytosis, chronic Multiple myeloma Mild macrocytic anemia Bone marrow biopsy 08/24/2022-hypercellular with increased number of plasma cells representing 15% of all cells in the aspirate associated with interstitial infiltrates and small clusters in the clot and biopsy sections.  Plasma cells show kappa light chain restriction consistent with plasma cell neoplasm.  Background shows trilineage hematopoiesis with nonspecific changes.  Normal cytogenetics.  FISH with no evidence of abnormalities. IgA kappa monoclonal protein, elevated free kappa light  chains 09/06/2022 bone survey-no radiographic evidence of multiple myeloma 12/04/2022 myeloma labs stable to improved Mild leukopenia on CBC 09/27/2020, normal on 10/18/2020, 06/21/2021, 201 2024 History of B12 deficiency Hypertension Hyperlipidemia Severe AS status post TAVR April 2019 Nonobstructive CAD Thoracic aortic aneurysm Back pain-MRI lumbar spine 12/04/2022 with mildly progressive foraminal narrowing bilaterally at L4-5 and on the left at L5-S1 and S1-2; stable enhancing lesion posteriorly L4 vertebral body; additional small foci of enhancement within the lumbar spine and upper sacrum bilaterally indeterminate for recurrent/progressive myeloma; no evidence of pathologic fracture.      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  He is asymptomatic.  The hemoglobin and platelet count are stable.  He has mild neutropenia today.  The neutrophil count was lower earlier this year.  The increased neutrophil count several months ago may have been related to prednisone therapy.  The plan is to continue observation.  He will seek medical attention for symptoms of an infection.  He will return for an office visit and repeat myeloma panel in 3 months.  Thornton Papas, MD  04/24/2023  12:33 PM

## 2023-04-25 LAB — PROTEIN ELECTROPHORESIS, SERUM
A/G Ratio: 1.3 (ref 0.7–1.7)
Albumin ELP: 3.5 g/dL (ref 2.9–4.4)
Alpha-1-Globulin: 0.2 g/dL (ref 0.0–0.4)
Alpha-2-Globulin: 0.5 g/dL (ref 0.4–1.0)
Beta Globulin: 1.2 g/dL (ref 0.7–1.3)
Gamma Globulin: 0.7 g/dL (ref 0.4–1.8)
Globulin, Total: 2.6 g/dL (ref 2.2–3.9)
Total Protein ELP: 6.1 g/dL (ref 6.0–8.5)

## 2023-04-25 LAB — KAPPA/LAMBDA LIGHT CHAINS
Kappa free light chain: 170 mg/L — ABNORMAL HIGH (ref 3.3–19.4)
Kappa, lambda light chain ratio: 10.43 — ABNORMAL HIGH (ref 0.26–1.65)
Lambda free light chains: 16.3 mg/L (ref 5.7–26.3)

## 2023-04-25 LAB — IGG, IGA, IGM
IgA: 607 mg/dL — ABNORMAL HIGH (ref 61–437)
IgG (Immunoglobin G), Serum: 768 mg/dL (ref 603–1613)
IgM (Immunoglobulin M), Srm: 53 mg/dL (ref 15–143)

## 2023-04-26 ENCOUNTER — Encounter: Payer: Self-pay | Admitting: *Deleted

## 2023-05-21 ENCOUNTER — Ambulatory Visit: Payer: PPO | Attending: Cardiology

## 2023-05-21 DIAGNOSIS — Z5181 Encounter for therapeutic drug level monitoring: Secondary | ICD-10-CM

## 2023-05-21 DIAGNOSIS — I8290 Acute embolism and thrombosis of unspecified vein: Secondary | ICD-10-CM

## 2023-05-21 LAB — POCT INR: INR: 2.7 (ref 2.0–3.0)

## 2023-05-21 NOTE — Patient Instructions (Signed)
Description    Continue taking warfarin 1.5 tablets daily except for 1 tablet on Mondays and Fridays. -Recheck INR in 5 weeks.  Coumadin Clinic [phone], Main number [phone].

## 2023-05-28 DIAGNOSIS — M545 Low back pain, unspecified: Secondary | ICD-10-CM | POA: Diagnosis not present

## 2023-06-25 ENCOUNTER — Ambulatory Visit: Payer: PPO | Attending: Cardiovascular Disease | Admitting: *Deleted

## 2023-06-25 DIAGNOSIS — I8290 Acute embolism and thrombosis of unspecified vein: Secondary | ICD-10-CM

## 2023-06-25 DIAGNOSIS — Z5181 Encounter for therapeutic drug level monitoring: Secondary | ICD-10-CM

## 2023-06-25 LAB — POCT INR: INR: 3.2 — AB (ref 2.0–3.0)

## 2023-06-25 NOTE — Patient Instructions (Signed)
 Description   Do not take any warfarin tomorrow (already taken today's dose) then continue taking warfarin 1.5 tablets daily except for 1 tablet on Mondays and Fridays. Recheck INR in 4 weeks.  Coumadin Clinic [phone], Main number [phone].

## 2023-06-27 DIAGNOSIS — N401 Enlarged prostate with lower urinary tract symptoms: Secondary | ICD-10-CM | POA: Diagnosis not present

## 2023-06-27 DIAGNOSIS — N200 Calculus of kidney: Secondary | ICD-10-CM | POA: Diagnosis not present

## 2023-06-27 DIAGNOSIS — R351 Nocturia: Secondary | ICD-10-CM | POA: Diagnosis not present

## 2023-06-30 ENCOUNTER — Other Ambulatory Visit: Payer: Self-pay | Admitting: Cardiovascular Disease

## 2023-06-30 ENCOUNTER — Other Ambulatory Visit: Payer: Self-pay | Admitting: Family Medicine

## 2023-07-04 ENCOUNTER — Ambulatory Visit: Payer: PPO

## 2023-07-04 VITALS — Wt 234.0 lb

## 2023-07-04 DIAGNOSIS — Z0000 Encounter for general adult medical examination without abnormal findings: Secondary | ICD-10-CM

## 2023-07-04 NOTE — Patient Instructions (Signed)
 [name] , Thank you for taking time to come for your Medicare Wellness Visit. I appreciate your ongoing commitment to your health goals. Please review the following plan we discussed and let me know if I can assist you in the future.   Referrals/Orders/Follow-Ups/Clinician Recommendations: Maintain health and activity   This is a list of the screening recommended for you and due dates:  Health Maintenance  Topic Date Due   COVID-19 Vaccine (7 - 2024-25 season) 05/25/2023   Medicare Annual Wellness Visit  07/03/2024   DTaP/Tdap/Td vaccine (3 - Td or Tdap) 05/22/2028   Pneumonia Vaccine  Completed   Flu Shot  Completed   Zoster (Shingles) Vaccine  Completed   HPV Vaccine  Aged Out    Advanced directives: (In Chart) A copy of your advanced directives are scanned into your chart should your provider ever need it.  Next Medicare Annual Wellness Visit scheduled for next year: Yes

## 2023-07-04 NOTE — Progress Notes (Signed)
 Subjective:   Barry Decker is a 89 y.o. male who presents for Medicare Annual/Subsequent preventive examination.  Visit Complete: Virtual I connected with  Scherry Curtis on 07/04/23 by a video and audio enabled telemedicine application and verified that I am speaking with the correct person using two identifiers.  Patient Location: Home  Provider Location: Home Office  I discussed the limitations of evaluation and management by telemedicine. The patient expressed understanding and agreed to proceed.  Vital Signs: Because this visit was a virtual/telehealth visit, some criteria may be missing or patient reported. Any vitals not documented were not able to be obtained and vitals that have been documented are patient reported.   Cardiac Risk Factors include: advanced age (>69men, >24 women);hypertension;dyslipidemia;male gender;obesity (BMI >30kg/m2)     Objective:    Today's Vitals   07/04/23 1113  Weight: 234 lb (106.1 kg)   Body mass index is 31.74 kg/m.     07/04/2023   11:18 AM 09/19/2022    8:24 AM 09/06/2022    2:16 PM 09/06/2022    2:13 PM 08/24/2022    9:00 AM 06/27/2022   11:44 AM 03/21/2022   10:57 AM  Advanced Directives  Does Patient Have a Medical Advance Directive? Yes Yes Yes Yes Yes Yes Yes  Type of Estate agent of [place];Living will Healthcare Power of [place];Living will Living will;Healthcare Power of Attorney Living will;Healthcare Power of [employer] Power of [place];Living will Healthcare Power of [place];Living will Healthcare Power of [place];Living will  Does patient want to make changes to medical advance directive? No - Patient declined No - Patient declined  No - Patient declined No - Guardian declined No - Patient declined No - Patient declined  Copy of Healthcare Power of Attorney in Chart? Yes - validated most recent copy scanned in chart (See row information) Yes - validated most recent copy scanned in chart (See  row information)   Yes - validated most recent copy scanned in chart (See row information) Yes - validated most recent copy scanned in chart (See row information) Yes - validated most recent copy scanned in chart (See row information)    Current Medications (verified) Outpatient Encounter Medications as of 07/04/2023  Medication Sig   acetaminophen  (TYLENOL ) 650 MG CR tablet Take 650 mg by mouth every 8 (eight) hours as needed for pain.   allopurinol  (ZYLOPRIM ) 300 MG tablet TAKE 1 TABLET BY MOUTH EVERY DAY   amLODipine  (NORVASC ) 10 MG tablet TAKE 1 TABLET BY MOUTH EVERY DAY   Ascorbic Acid (VITAMIN C) 1000 MG tablet Take 1,000 mg by mouth daily.   cyanocobalamin  1000 MCG tablet Take 1,000 mcg by mouth daily.   FLUAD 0.5 ML injection    furosemide  (LASIX ) 20 MG tablet TAKE 1 TABLET BY MOUTH EVERY DAY   lisinopril  (ZESTRIL ) 20 MG tablet TAKE 1 TABLET BY MOUTH EVERY DAY   omeprazole  (PRILOSEC) 40 MG capsule TAKE 1 CAPSULE (40 MG TOTAL) BY MOUTH DAILY.   potassium citrate  (UROCIT-K ) 10 MEQ (1080 MG) SR tablet Take 1 tablet (10 mEq total) by mouth 3 (three) times daily with meals.   PREVNAR 20 0.5 ML injection    rosuvastatin  (CRESTOR ) 20 MG tablet TAKE 1 TABLET BY MOUTH ONE TIME PER WEEK   SPIKEVAX syringe    warfarin (COUMADIN ) 5 MG tablet TAKE 1 TO 1&1/2 TABLETS ONCE DAILY BY MOUTH AS DIRECTED BY ANTICOAGULATION CLINIC   amoxicillin  (AMOXIL ) 500 MG capsule TAKE 4 CAPS BY MOUTH AS DIRECTED  1 HOUR PRIOR TO DENTAL WORK INCLUDING CLEANINGS (Patient not taking: Reported on 07/04/2023)   No facility-administered encounter medications on file as of 07/04/2023.    Allergies (verified) Patient has no known allergies.   History: Past Medical History:  Diagnosis Date   Aortic atherosclerosis (HCC)    Aortic valve disease    a. severe aortic stenosis s/p TAVR 09/2017.   Arthritis    "spinal" (11/16/2015)   Atypical nevus 05/14/2013   moderate atypia - left scapula   Colon cancer (HCC)     precancer-partial colectomy   Dilated aortic root (HCC)    Elevated HDL    First degree AV block    First degree AV block    Gout    Heart murmur    History of kidney stones    Hyperlipidemia    Hypertension    LBBB (left bundle branch block)    Nonobstructive atherosclerosis of coronary artery    Squamous cell skin cancer 08/04/2011   Left outer brow - CX3 + 5FU   Thoracic aortic aneurysm [facility])    Past Surgical History:  Procedure Laterality Date   BACK SURGERY     CATARACT EXTRACTION W/ INTRAOCULAR LENS  IMPLANT, BILATERAL Bilateral 2017   COLONOSCOPY W/ POLYPECTOMY     FACET JOINT INJECTION  2009   IR BONE MARROW BIOPSY & ASPIRATION  08/24/2022   LITHOTRIPSY  2010   LUMBAR LAMINECTOMY  ~ 1992   PARTIAL COLECTOMY  1997   precancerours   PARTIAL KNEE ARTHROPLASTY Left 11/16/2015   Procedure: LEFT KNEE UNICOMPARTMENTAL ;  Surgeon: Saundra Curl, MD;  Location: [facility] OR;  Service: Orthopedics;  Laterality: Left;   REPLACEMENT UNICONDYLAR JOINT KNEE Left 11/16/2015   RIGHT/LEFT HEART CATH AND CORONARY ANGIOGRAPHY N/A 08/10/2017   Procedure: RIGHT/LEFT HEART CATH AND CORONARY ANGIOGRAPHY;  Surgeon: Odie Benne, MD;  Location: [facility] INVASIVE CV LAB;  Service: Cardiovascular;  Laterality: N/A;   TEE WITHOUT CARDIOVERSION N/A 09/25/2017   Procedure: TRANSESOPHAGEAL ECHOCARDIOGRAM (TEE);  Surgeon: Odie Benne, MD;  Location[facility];  Service: Open Heart Surgery;  Laterality: N/A;   TONSILLECTOMY     TRANSCATHETER AORTIC VALVE REPLACEMENT, TRANSFEMORAL N/A 09/25/2017   Procedure: TRANSCATHETER AORTIC VALVE REPLACEMENT, TRANSFEMORAL;  Surgeon: Odie Benne, MD;  Location: [facility] OR;  Service: Open Heart Surgery;  Laterality: N/A;   TYMPANOPLASTY Left 1998   ruptured ear drum with skin graft   Family History  Problem Relation Age of Onset   Heart attack Mother 39       former smoker   Heart attack Father 30       former smoker   Hypertension Sister    Heart  attack Brother 72       Rheumatic fever   Social History   Socioeconomic History   Marital status: Married    Spouse name: Not on file   Number of children: 5   Years of education: Not on file   Highest education level: Not on file  Occupational History   Occupation: Administrator, Civil Service  Tobacco Use   Smoking status: Former    Current packs/day: 0.00    Average packs/day: 1 pack/day for 20.0 years (20.0 ttl pk-yrs)    Types: Cigarettes    Start date: 09/15/1960    Quit date: 09/15/1980    Years since quitting: 42.8   Smokeless tobacco: Never  Vaping Use   Vaping status: Never Used  Substance and Sexual Activity   Alcohol use: Yes  Alcohol/week: 7.0 standard drinks of alcohol    Types: 1 Glasses of wine, 1 Shots of liquor, 5 Standard drinks or equivalent per week   Drug use: No   Sexual activity: Never  Other Topics Concern   Not on file  Social History Narrative   Married. 5 children. 4 grandkids. 1 greatgrandkid.       Retired from Corporate investment banker in Museum/gallery curator.       Hobbies: woodworking      Advised to consider advanced directives/hcpoa-thinks wife may have HCPOA from years ago.   Social Drivers of Corporate investment banker Strain: Low Risk  (07/04/2023)   Overall Financial Resource Strain (CARDIA)    Difficulty of Paying Living Expenses: Not hard at all  Food Insecurity: No Food Insecurity (07/04/2023)   Hunger Vital Sign    Worried About Running Out of Food in the Last Year: Never true    Ran Out of Food in the Last Year: Never true  Transportation Needs: No Transportation Needs (07/04/2023)   PRAPARE - Administrator, Civil Service (Medical): No    Lack of Transportation (Non-Medical): No  Physical Activity: Sufficiently Active (07/04/2023)   Exercise Vital Sign    Days of Exercise per Week: 5 days    Minutes of Exercise per Session: 30 min  Stress: No Stress Concern Present (07/04/2023)   [employer] of Occupational  Health - Occupational Stress Questionnaire    Feeling of Stress : Not at all  Social Connections: Socially Integrated (07/04/2023)   Social Connection and Isolation Panel [NHANES]    Frequency of Communication with Friends and Family: Three times a week    Frequency of Social Gatherings with Friends and Family: Three times a week    Attends Religious Services: More than 4 times per year    Active Member of Clubs or Organizations: Yes    Attends Banker Meetings: 1 to 4 times per year    Marital Status: Married    Tobacco Counseling Counseling given: Not Answered   Clinical Intake:  Pre-visit preparation completed: Yes  Pain : No/denies pain     BMI - recorded: 31.74 Nutritional Status: BMI > 30  Obese Diabetes: No  How often do you need to have someone help you when you read instructions, pamphlets, or other written materials from your doctor or pharmacy?: 1 - Never  Interpreter Needed?: No  Information entered by :: Lamont Pilsner, LPN   Activities of Daily Living    07/04/2023   11:16 AM 08/24/2022    9:06 AM  In your present state of health, do you have any difficulty performing the following activities:  Hearing? 0 0  Vision? 0 0  Difficulty concentrating or making decisions? 0 0  Walking or climbing stairs? 0 0  Dressing or bathing? 0 0  Doing errands, shopping? 0   Preparing Food and eating ? N   Using the Toilet? N   In the past six months, have you accidently leaked urine? N   Do you have problems with loss of bowel control? N   Managing your Medications? N   Managing your Finances? N   Housekeeping or managing your Housekeeping? N     Patient Care Team: Almira Jaeger, MD as PCP - General (Family Medicine) Odie Benne, MD as PCP - Cardiology (Cardiology) Myrle Aspen, [facility] (Inactive) (Pharmacist)  Indicate any recent Medical Services you may have received from other than [facility] providers in the  past year (date may be  approximate).     Assessment:   This is a routine wellness examination for [place].  Hearing/Vision screen Hearing Screening - Comments:: Pt denies any hearing issues  Vision Screening - Comments:: Pt follows up with Dr Paulene Boron for annual eye exam exams    Goals Addressed             This Visit's Progress    Patient Stated       Maintain health and activity        Depression Screen    07/04/2023   11:18 AM 06/27/2022   11:43 AM 05/27/2021   10:18 AM 04/01/2021    8:53 AM 08/27/2020    1:44 PM 03/26/2020   10:40 AM 03/21/2019    9:43 AM  PHQ 2/9 Scores  PHQ - 2 Score 0 0 0 0 0 0 0    Fall Risk    07/04/2023   11:20 AM 06/27/2022   11:44 AM 06/22/2022   10:28 AM 05/27/2021   10:19 AM 04/01/2021    8:53 AM  Fall Risk   Falls in the past year? 0 0 0 0 0  Number falls in past yr: 0 0 0 0 0  Injury with Fall? 0 0 0 0 0  Risk for fall due to : No Fall Risks Impaired vision  Impaired vision No Fall Risks  Follow up Falls prevention discussed Falls prevention discussed  Falls prevention discussed Falls evaluation completed    MEDICARE RISK AT HOME: Medicare Risk at Home Any stairs in or around the home?: Yes If so, are there any without handrails?: No Home free of loose throw rugs in walkways, pet beds, electrical cords, etc?: Yes Adequate lighting in your home to reduce risk of falls?: Yes Life alert?: No Use of a cane, walker or w/c?: Yes Grab bars in the bathroom?: Yes Shower chair or bench in shower?: Yes Elevated toilet seat or a handicapped toilet?: No  TIMED UP AND GO:  Was the test performed?  No    Cognitive Function:        07/04/2023   11:21 AM 06/27/2022   11:45 AM 05/27/2021   10:21 AM  6CIT Screen  What Year? 0 points 0 points 0 points  What month? 0 points 0 points 0 points  What time? 0 points 0 points 0 points  Count back from 20 0 points 0 points 0 points  Months in reverse 0 points 0 points 0 points  Repeat phrase 0 points 0 points 0 points  Total  Score 0 points 0 points 0 points    Immunizations Immunization History  Administered Date(s) Administered   Fluad Quad(high Dose 65+) 03/05/2020, 04/05/2021, 04/04/2022   Fluad Trivalent(High Dose 65+) 03/30/2023   Influenza, High Dose Seasonal PF 03/26/2014, 04/08/2017, 03/20/2018, 02/03/2019, 03/04/2019, 04/05/2021   Influenza,inj,Quad PF,6+ Mos 03/14/2013   Influenza-Unspecified 04/06/2015, 03/02/2016, 02/28/2017, 03/20/2018   Moderna Covid-19 Fall Seasonal Vaccine 26yrs & older 03/30/2023   PFIZER Comirnaty(Gray Top)Covid-19 Tri-Sucrose Vaccine 11/18/2020   PFIZER(Purple Top)SARS-COV-2 Vaccination 07/09/2019, 07/28/2019, 03/17/2020   PNEUMOCOCCAL CONJUGATE-20 04/04/2022, 03/30/2023   Pfizer Covid-19 Vaccine Bivalent Booster 75yrs & up 07/05/2021   Pneumococcal Conjugate-13 05/25/2014   Pneumococcal Polysaccharide-23 06/19/2004   Td 12/27/2007   Tdap 05/22/2018   Zoster Recombinant(Shingrix) 06/19/2018, 12/26/2018   Zoster, Live 06/19/2008    TDAP status: Up to date  Flu Vaccine status: Up to date  Pneumococcal vaccine status: Up to date  Covid-19 vaccine status: Information provided  on how to obtain vaccines.   Qualifies for Shingles Vaccine? Yes   Zostavax completed Yes   Shingrix Completed?: Yes  Screening Tests Health Maintenance  Topic Date Due   COVID-19 Vaccine (7 - 2024-25 season) 05/25/2023   Medicare Annual Wellness (AWV)  07/03/2024   DTaP/Tdap/Td (3 - Td or Tdap) 05/22/2028   Pneumonia Vaccine 77+ Years old  Completed   INFLUENZA VACCINE  Completed   Zoster Vaccines- Shingrix  Completed   HPV VACCINES  Aged Out    Health Maintenance  Health Maintenance Due  Topic Date Due   COVID-19 Vaccine (7 - 2024-25 season) 05/25/2023    Colorectal cancer screening: No longer required.   Additional Screening:  Vision Screening: Recommended annual ophthalmology exams for early detection of glaucoma and other disorders of the eye. Is the patient up to date  with their annual eye exam?  Yes  Who is the provider or what is the name of the office in which the patient attends annual eye exams? Dr Paulene Boron @ [facility]  If pt is not established with a provider, would they like to be referred to a provider to establish care? No .   Dental Screening: Recommended annual dental exams for proper oral hygiene  Community Resource Referral / Chronic Care Management: CRR required this visit?  No   CCM required this visit?  No     Plan:     I have personally reviewed and noted the following in the patient's chart:   Medical and social history Use of alcohol, tobacco or illicit drugs  Current medications and supplements including opioid prescriptions. Patient is not currently taking opioid prescriptions. Functional ability and status Nutritional status Physical activity Advanced directives List of other physicians Hospitalizations, surgeries, and ER visits in previous 12 months Vitals Screenings to include cognitive, depression, and falls Referrals and appointments  In addition, I have reviewed and discussed with patient certain preventive protocols, quality metrics, and best practice recommendations. A written personalized care plan for preventive services as well as general preventive health recommendations were provided to patient.     Bruno Capri, LPN   1/61/0960   After Visit Summary: (MyChart) Due to this being a telephonic visit, the after visit summary with patients personalized plan was offered to patient via MyChart   Nurse Notes: none

## 2023-07-23 ENCOUNTER — Ambulatory Visit: Payer: PPO | Attending: Cardiovascular Disease | Admitting: *Deleted

## 2023-07-23 DIAGNOSIS — Z5181 Encounter for therapeutic drug level monitoring: Secondary | ICD-10-CM | POA: Diagnosis not present

## 2023-07-23 DIAGNOSIS — I8290 Acute embolism and thrombosis of unspecified vein: Secondary | ICD-10-CM | POA: Diagnosis not present

## 2023-07-23 LAB — POCT INR: INR: 3.3 — AB (ref 2.0–3.0)

## 2023-07-23 NOTE — Patient Instructions (Signed)
Description   Do not take any warfarin tomorrow (already taken today's dose) then START taking warfarin 1.5 tablets daily except for 1 tablet on Mondays, Wednesdays, and Fridays. Recheck INR in 4 weeks.  [facility] [phone], Main number [phone].

## 2023-07-25 ENCOUNTER — Other Ambulatory Visit: Payer: PPO

## 2023-07-25 ENCOUNTER — Inpatient Hospital Stay: Payer: PPO | Attending: Oncology

## 2023-07-25 DIAGNOSIS — C9000 Multiple myeloma not having achieved remission: Secondary | ICD-10-CM | POA: Insufficient documentation

## 2023-07-25 DIAGNOSIS — I2510 Atherosclerotic heart disease of native coronary artery without angina pectoris: Secondary | ICD-10-CM | POA: Diagnosis not present

## 2023-07-25 DIAGNOSIS — I10 Essential (primary) hypertension: Secondary | ICD-10-CM | POA: Diagnosis not present

## 2023-07-25 DIAGNOSIS — D72819 Decreased white blood cell count, unspecified: Secondary | ICD-10-CM | POA: Insufficient documentation

## 2023-07-25 DIAGNOSIS — E785 Hyperlipidemia, unspecified: Secondary | ICD-10-CM | POA: Insufficient documentation

## 2023-07-25 DIAGNOSIS — E538 Deficiency of other specified B group vitamins: Secondary | ICD-10-CM | POA: Diagnosis not present

## 2023-07-25 DIAGNOSIS — D539 Nutritional anemia, unspecified: Secondary | ICD-10-CM | POA: Diagnosis not present

## 2023-07-25 LAB — CBC WITH DIFFERENTIAL (CANCER CENTER ONLY)
Abs Immature Granulocytes: 0.02 10*3/uL (ref 0.00–0.07)
Basophils Absolute: 0 10*3/uL (ref 0.0–0.1)
Basophils Relative: 0 %
Eosinophils Absolute: 0.1 10*3/uL (ref 0.0–0.5)
Eosinophils Relative: 2 %
HCT: 35 % — ABNORMAL LOW (ref 39.0–52.0)
Hemoglobin: 11.7 g/dL — ABNORMAL LOW (ref 13.0–17.0)
Immature Granulocytes: 1 %
Lymphocytes Relative: 40 %
Lymphs Abs: 1.3 10*3/uL (ref 0.7–4.0)
MCH: 36.9 pg — ABNORMAL HIGH (ref 26.0–34.0)
MCHC: 33.4 g/dL (ref 30.0–36.0)
MCV: 110.4 fL — ABNORMAL HIGH (ref 80.0–100.0)
Monocytes Absolute: 0.3 10*3/uL (ref 0.1–1.0)
Monocytes Relative: 9 %
Neutro Abs: 1.6 10*3/uL — ABNORMAL LOW (ref 1.7–7.7)
Neutrophils Relative %: 48 %
Platelet Count: 173 10*3/uL (ref 150–400)
RBC: 3.17 MIL/uL — ABNORMAL LOW (ref 4.22–5.81)
RDW: 14.8 % (ref 11.5–15.5)
WBC Count: 3.3 10*3/uL — ABNORMAL LOW (ref 4.0–10.5)
nRBC: 0 % (ref 0.0–0.2)

## 2023-07-25 LAB — CMP (CANCER CENTER ONLY)
ALT: 16 U/L (ref 0–44)
AST: 15 U/L (ref 15–41)
Albumin: 3.6 g/dL (ref 3.5–5.0)
Alkaline Phosphatase: 61 U/L (ref 38–126)
Anion gap: 5 (ref 5–15)
BUN: 25 mg/dL — ABNORMAL HIGH (ref 8–23)
CO2: 30 mmol/L (ref 22–32)
Calcium: 9.4 mg/dL (ref 8.9–10.3)
Chloride: 106 mmol/L (ref 98–111)
Creatinine: 1.05 mg/dL (ref 0.61–1.24)
GFR, Estimated: >60 mL/min (ref >60)
Glucose, Bld: 87 mg/dL (ref 70–99)
Potassium: 4.3 mmol/L (ref 3.5–5.1)
Sodium: 141 mmol/L (ref 135–145)
Total Bilirubin: 0.5 mg/dL (ref 0.0–1.2)
Total Protein: 6.7 g/dL (ref 6.5–8.1)

## 2023-07-27 LAB — KAPPA/LAMBDA LIGHT CHAINS
Kappa free light chain: 168.1 mg/L — ABNORMAL HIGH (ref 3.3–19.4)
Kappa, lambda light chain ratio: 11.67 — ABNORMAL HIGH (ref 0.26–1.65)
Lambda free light chains: 14.4 mg/L (ref 5.7–26.3)

## 2023-07-28 LAB — IGA: IgA: 640 mg/dL — ABNORMAL HIGH (ref 61–437)

## 2023-07-31 LAB — PROTEIN ELECTROPHORESIS, SERUM
A/G Ratio: 1.2 (ref 0.7–1.7)
Albumin ELP: 3.4 g/dL (ref 2.9–4.4)
Alpha-1-Globulin: 0.2 g/dL (ref 0.0–0.4)
Alpha-2-Globulin: 0.6 g/dL (ref 0.4–1.0)
Beta Globulin: 1.3 g/dL (ref 0.7–1.3)
Gamma Globulin: 0.7 g/dL (ref 0.4–1.8)
Globulin, Total: 2.9 g/dL (ref 2.2–3.9)
Total Protein ELP: 6.3 g/dL (ref 6.0–8.5)

## 2023-08-01 ENCOUNTER — Inpatient Hospital Stay: Payer: PPO | Admitting: Oncology

## 2023-08-01 VITALS — BP 124/59 | HR 62 | Temp 98.1°F | Resp 18 | Ht 72.0 in | Wt 232.0 lb

## 2023-08-01 DIAGNOSIS — C9000 Multiple myeloma not having achieved remission: Secondary | ICD-10-CM | POA: Diagnosis not present

## 2023-08-01 NOTE — Progress Notes (Signed)
  [facility] OFFICE PROGRESS NOTE   Diagnosis: Multiple myeloma  INTERVAL HISTORY:   Barry Decker returns as scheduled.  He feels well.  No complaint.  No recent infection.  Objective:  Vital signs in last 24 hours:  Blood pressure (!) 124/59, pulse 62, temperature 98.1 F (36.7 C), temperature source Temporal, resp. rate 18, height 6' (1.829 m), weight 232 lb (105.2 kg), SpO2 98%.     Lymphatics: No cervical, supraclavicular, axillary, or inguinal nodes Resp: Lungs clear bilaterally Cardio: Regular rate and rhythm GI: Nontender, no mass, no hepatosplenomegaly Vascular: No leg edema   Lab Results:  Lab Results  Component Value Date   WBC 3.3 (L) 07/25/2023   HGB 11.7 (L) 07/25/2023   HCT 35.0 (L) 07/25/2023   MCV 110.4 (H) 07/25/2023   PLT 173 07/25/2023   NEUTROABS 1.6 (L) 07/25/2023    CMP  Lab Results  Component Value Date   NA 141 07/25/2023   K 4.3 07/25/2023   CL 106 07/25/2023   CO2 30 07/25/2023   GLUCOSE 87 07/25/2023   BUN 25 (H) 07/25/2023   CREATININE 1.05 07/25/2023   CALCIUM 9.4 07/25/2023   PROT 6.7 07/25/2023   ALBUMIN 3.6 07/25/2023   AST 15 07/25/2023   ALT 16 07/25/2023   ALKPHOS 61 07/25/2023   BILITOT 0.5 07/25/2023   GFRNONAA >60 07/25/2023   GFRAA 70 05/11/2020     Lab Results  Component Value Date   INR 3.3 (A) 07/23/2023   LABPROT 20.9 (H) 07/09/2020     Medications: I have reviewed the patient's current medications.   Assessment/Plan: Red cell macrocytosis, chronic Multiple myeloma Mild macrocytic anemia Bone marrow biopsy 08/24/2022-hypercellular with increased number of plasma cells representing 15% of all cells in the aspirate associated with interstitial infiltrates and small clusters in the clot and biopsy sections.  Plasma cells show kappa light chain restriction consistent with plasma cell neoplasm.  Background shows trilineage hematopoiesis with nonspecific changes.  Normal cytogenetics.  FISH with  no evidence of abnormalities. IgA kappa monoclonal protein, elevated free kappa light chains 09/06/2022 bone survey-no radiographic evidence of multiple myeloma 12/04/2022 myeloma labs stable to improved 07/25/2023-myeloma labs stable Mild leukopenia on CBC 09/27/2020, normal on 10/18/2020, 06/21/2021, 201 2024 History of B12 deficiency Hypertension Hyperlipidemia Severe AS status post TAVR April 2019 Nonobstructive CAD Thoracic aortic aneurysm Back pain-MRI lumbar spine 12/04/2022 with mildly progressive foraminal narrowing bilaterally at L4-5 and on the left at L5-S1 and S1-2; stable enhancing lesion posteriorly L4 vertebral body; additional small foci of enhancement within the lumbar spine and upper sacrum bilaterally indeterminate for recurrent/progressive myeloma; no evidence of pathologic fracture.       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  He is asymptomatic.  He is stable from a hematologic standpoint.  He has mild neutropenia.  This has not changed significantly over the past year.  The serum M spike is lower in the IgA/kappa free light chains have not changed over the past year.  He would like to continue observation.  Barry Decker will return for an office and lab visit in 6 months.  He will call for new symptoms.  Thornton Papas, MD  08/01/2023  11:17 AM

## 2023-08-15 ENCOUNTER — Other Ambulatory Visit: Payer: Self-pay | Admitting: Cardiovascular Disease

## 2023-08-15 DIAGNOSIS — Z5181 Encounter for therapeutic drug level monitoring: Secondary | ICD-10-CM

## 2023-08-20 ENCOUNTER — Ambulatory Visit: Payer: PPO | Attending: Cardiovascular Disease | Admitting: *Deleted

## 2023-08-20 DIAGNOSIS — Z5181 Encounter for therapeutic drug level monitoring: Secondary | ICD-10-CM | POA: Diagnosis not present

## 2023-08-20 DIAGNOSIS — Z79899 Other long term (current) drug therapy: Secondary | ICD-10-CM

## 2023-08-20 DIAGNOSIS — I8290 Acute embolism and thrombosis of unspecified vein: Secondary | ICD-10-CM | POA: Diagnosis not present

## 2023-08-20 LAB — POCT INR: INR: 3.2 — AB (ref 2.0–3.0)

## 2023-08-20 NOTE — Patient Instructions (Signed)
 Description   Do not take any warfarin tomorrow (already taken today's dose) then START taking warfarin 1 tablet daily except for 1.5 tablets on Sunday, Tuesday, and Thursday. Recheck INR in 4 weeks.  [facility] [phone], Main number [phone].

## 2023-09-17 ENCOUNTER — Ambulatory Visit: Attending: Cardiovascular Disease

## 2023-09-17 DIAGNOSIS — I8290 Acute embolism and thrombosis of unspecified vein: Secondary | ICD-10-CM

## 2023-09-17 DIAGNOSIS — Z79899 Other long term (current) drug therapy: Secondary | ICD-10-CM

## 2023-09-17 DIAGNOSIS — Z5181 Encounter for therapeutic drug level monitoring: Secondary | ICD-10-CM

## 2023-09-17 LAB — POCT INR: INR: 3 (ref 2.0–3.0)

## 2023-09-17 NOTE — Patient Instructions (Signed)
 Continue taking warfarin 1 tablet daily except for 1.5 tablets on Sunday, Tuesday, and Thursday. Recheck INR in 6 weeks.  [facility] [phone], Main number [phone].

## 2023-10-29 ENCOUNTER — Ambulatory Visit: Attending: Cardiovascular Disease

## 2023-10-29 DIAGNOSIS — I8290 Acute embolism and thrombosis of unspecified vein: Secondary | ICD-10-CM | POA: Diagnosis not present

## 2023-10-29 DIAGNOSIS — Z79899 Other long term (current) drug therapy: Secondary | ICD-10-CM

## 2023-10-29 DIAGNOSIS — Z5181 Encounter for therapeutic drug level monitoring: Secondary | ICD-10-CM | POA: Diagnosis not present

## 2023-10-29 LAB — POCT INR: INR: 3.9 — AB (ref 2.0–3.0)

## 2023-10-29 NOTE — Patient Instructions (Signed)
 Hold tomorrow only then Continue taking warfarin 1 tablet daily except for 1.5 tablets on Sunday, Tuesday, and Thursday. Recheck INR in 6 weeks.  [facility] [phone], Main number [phone].

## 2023-11-10 ENCOUNTER — Other Ambulatory Visit: Payer: Self-pay | Admitting: Cardiovascular Disease

## 2023-11-10 ENCOUNTER — Other Ambulatory Visit: Payer: Self-pay | Admitting: Family Medicine

## 2023-12-10 ENCOUNTER — Ambulatory Visit: Attending: Cardiovascular Disease

## 2023-12-10 DIAGNOSIS — I8290 Acute embolism and thrombosis of unspecified vein: Secondary | ICD-10-CM | POA: Diagnosis not present

## 2023-12-10 DIAGNOSIS — Z5181 Encounter for therapeutic drug level monitoring: Secondary | ICD-10-CM

## 2023-12-10 DIAGNOSIS — Z79899 Other long term (current) drug therapy: Secondary | ICD-10-CM

## 2023-12-10 LAB — POCT INR: INR: 3 (ref 2.0–3.0)

## 2023-12-10 NOTE — Progress Notes (Signed)
Please see anticoagulation encounter.

## 2023-12-10 NOTE — Patient Instructions (Signed)
 Continue taking warfarin 1 tablet daily except for 1.5 tablets on Sunday, Tuesday, and Thursday. Recheck INR in 8 weeks.  [facility] [phone], Main number [phone].

## 2023-12-27 DIAGNOSIS — L309 Dermatitis, unspecified: Secondary | ICD-10-CM | POA: Diagnosis not present

## 2023-12-27 DIAGNOSIS — L570 Actinic keratosis: Secondary | ICD-10-CM | POA: Diagnosis not present

## 2023-12-27 DIAGNOSIS — L821 Other seborrheic keratosis: Secondary | ICD-10-CM | POA: Diagnosis not present

## 2023-12-27 DIAGNOSIS — D1801 Hemangioma of skin and subcutaneous tissue: Secondary | ICD-10-CM | POA: Diagnosis not present

## 2023-12-27 DIAGNOSIS — L814 Other melanin hyperpigmentation: Secondary | ICD-10-CM | POA: Diagnosis not present

## 2024-01-02 ENCOUNTER — Ambulatory Visit: Admitting: Nurse Practitioner

## 2024-01-12 ENCOUNTER — Other Ambulatory Visit: Payer: Self-pay | Admitting: Family Medicine

## 2024-01-13 ENCOUNTER — Other Ambulatory Visit: Payer: Self-pay | Admitting: Cardiovascular Disease

## 2024-01-13 DIAGNOSIS — Z5181 Encounter for therapeutic drug level monitoring: Secondary | ICD-10-CM

## 2024-01-14 ENCOUNTER — Inpatient Hospital Stay: Payer: PPO | Attending: Oncology

## 2024-01-14 DIAGNOSIS — C9000 Multiple myeloma not having achieved remission: Secondary | ICD-10-CM

## 2024-01-14 DIAGNOSIS — D472 Monoclonal gammopathy: Secondary | ICD-10-CM | POA: Insufficient documentation

## 2024-01-14 LAB — CBC WITH DIFFERENTIAL (CANCER CENTER ONLY)
Abs Immature Granulocytes: 0.01 K/uL (ref 0.00–0.07)
Basophils Absolute: 0 K/uL (ref 0.0–0.1)
Basophils Relative: 0 %
Eosinophils Absolute: 0.1 K/uL (ref 0.0–0.5)
Eosinophils Relative: 4 %
HCT: 32.8 % — ABNORMAL LOW (ref 39.0–52.0)
Hemoglobin: 11 g/dL — ABNORMAL LOW (ref 13.0–17.0)
Immature Granulocytes: 0 %
Lymphocytes Relative: 41 %
Lymphs Abs: 1.2 K/uL (ref 0.7–4.0)
MCH: 37 pg — ABNORMAL HIGH (ref 26.0–34.0)
MCHC: 33.5 g/dL (ref 30.0–36.0)
MCV: 110.4 fL — ABNORMAL HIGH (ref 80.0–100.0)
Monocytes Absolute: 0.2 K/uL (ref 0.1–1.0)
Monocytes Relative: 8 %
Neutro Abs: 1.4 K/uL — ABNORMAL LOW (ref 1.7–7.7)
Neutrophils Relative %: 47 %
Platelet Count: 153 K/uL (ref 150–400)
RBC: 2.97 MIL/uL — ABNORMAL LOW (ref 4.22–5.81)
RDW: 15.1 % (ref 11.5–15.5)
WBC Count: 2.9 K/uL — ABNORMAL LOW (ref 4.0–10.5)
nRBC: 0 % (ref 0.0–0.2)

## 2024-01-14 LAB — CMP (CANCER CENTER ONLY)
ALT: 13 U/L (ref 0–44)
AST: 18 U/L (ref 15–41)
Albumin: 3.9 g/dL (ref 3.5–5.0)
Alkaline Phosphatase: 91 U/L (ref 38–126)
Anion gap: 9 (ref 5–15)
BUN: 23 mg/dL (ref 8–23)
CO2: 26 mmol/L (ref 22–32)
Calcium: 9.5 mg/dL (ref 8.9–10.3)
Chloride: 108 mmol/L (ref 98–111)
Creatinine: 1.08 mg/dL (ref 0.61–1.24)
GFR, Estimated: >60 mL/min (ref >60)
Glucose, Bld: 88 mg/dL (ref 70–99)
Potassium: 4.3 mmol/L (ref 3.5–5.1)
Sodium: 143 mmol/L (ref 135–145)
Total Bilirubin: 0.3 mg/dL (ref 0.0–1.2)
Total Protein: 6.8 g/dL (ref 6.5–8.1)

## 2024-01-15 LAB — KAPPA/LAMBDA LIGHT CHAINS
Kappa free light chain: 208.4 mg/L — ABNORMAL HIGH (ref 3.3–19.4)
Kappa, lambda light chain ratio: 10.53 — ABNORMAL HIGH (ref 0.26–1.65)
Lambda free light chains: 19.8 mg/L (ref 5.7–26.3)

## 2024-01-15 LAB — IGA: IgA: 632 mg/dL — ABNORMAL HIGH (ref 61–437)

## 2024-01-16 ENCOUNTER — Other Ambulatory Visit: Payer: Self-pay

## 2024-01-16 LAB — PROTEIN ELECTROPHORESIS, SERUM
A/G Ratio: 1.3 (ref 0.7–1.7)
Albumin ELP: 3.4 g/dL (ref 2.9–4.4)
Alpha-1-Globulin: 0.2 g/dL (ref 0.0–0.4)
Alpha-2-Globulin: 0.6 g/dL (ref 0.4–1.0)
Beta Globulin: 1.2 g/dL (ref 0.7–1.3)
Gamma Globulin: 0.7 g/dL (ref 0.4–1.8)
Globulin, Total: 2.7 g/dL (ref 2.2–3.9)
M-Spike, %: 0.6 g/dL — ABNORMAL HIGH
Total Protein ELP: 6.1 g/dL (ref 6.0–8.5)

## 2024-01-16 MED ORDER — FUROSEMIDE 20 MG PO TABS: 20.0000 mg | ORAL_TABLET | Freq: Every day | ORAL | 0 refills | Status: DC

## 2024-01-21 ENCOUNTER — Inpatient Hospital Stay: Payer: PPO | Attending: Oncology | Admitting: Oncology

## 2024-01-21 VITALS — BP 117/61 | HR 60 | Temp 97.6°F | Resp 18 | Ht 72.0 in | Wt 224.5 lb

## 2024-01-21 DIAGNOSIS — C9000 Multiple myeloma not having achieved remission: Secondary | ICD-10-CM | POA: Diagnosis not present

## 2024-01-21 DIAGNOSIS — E785 Hyperlipidemia, unspecified: Secondary | ICD-10-CM | POA: Diagnosis not present

## 2024-01-21 DIAGNOSIS — E538 Deficiency of other specified B group vitamins: Secondary | ICD-10-CM | POA: Diagnosis not present

## 2024-01-21 DIAGNOSIS — D7589 Other specified diseases of blood and blood-forming organs: Secondary | ICD-10-CM | POA: Diagnosis not present

## 2024-01-21 DIAGNOSIS — I10 Essential (primary) hypertension: Secondary | ICD-10-CM | POA: Diagnosis not present

## 2024-01-21 DIAGNOSIS — I2510 Atherosclerotic heart disease of native coronary artery without angina pectoris: Secondary | ICD-10-CM | POA: Diagnosis not present

## 2024-01-21 DIAGNOSIS — D472 Monoclonal gammopathy: Secondary | ICD-10-CM | POA: Diagnosis not present

## 2024-01-21 DIAGNOSIS — I712 Thoracic aortic aneurysm, without rupture, unspecified: Secondary | ICD-10-CM | POA: Diagnosis not present

## 2024-01-21 NOTE — Progress Notes (Signed)
  [facility] OFFICE PROGRESS NOTE   Diagnosis: Multiple myeloma  INTERVAL HISTORY:   [name] returns as scheduled.  He feels well.  Good appetite.  No pain.  No recent infection.  No new complaint.  Objective:  Vital signs in last 24 hours:  Blood pressure 117/61, pulse 60, temperature 97.6 F (36.4 C), resp. rate 18, height 6' (1.829 m), weight 224 lb 8 oz (101.8 kg), SpO2 99%.    Lymphatics: No cervical, supraclavicular, axillary, or inguinal nodes Resp: Lungs clear bilaterally Cardio: Regular rate and rhythm GI: No hepatosplenomegaly Vascular: No leg edema   Lab Results:  Lab Results  Component Value Date   WBC 2.9 (L) 01/14/2024   HGB 11.0 (L) 01/14/2024   HCT 32.8 (L) 01/14/2024   MCV 110.4 (H) 01/14/2024   PLT 153 01/14/2024   NEUTROABS 1.4 (L) 01/14/2024    CMP  Lab Results  Component Value Date   NA 143 01/14/2024   K 4.3 01/14/2024   CL 108 01/14/2024   CO2 26 01/14/2024   GLUCOSE 88 01/14/2024   BUN 23 01/14/2024   CREATININE 1.08 01/14/2024   CALCIUM  9.5 01/14/2024   PROT 6.8 01/14/2024   ALBUMIN  3.9 01/14/2024   AST 18 01/14/2024   ALT 13 01/14/2024   ALKPHOS 91 01/14/2024   BILITOT 0.3 01/14/2024   GFRNONAA >60 01/14/2024   GFRAA 70 05/11/2020    No results found for: CEA1, CEA, CAN199, CA125  Lab Results  Component Value Date   INR 3.0 12/10/2023   LABPROT 20.9 (H) 07/09/2020    Imaging:  No results found.  Medications: I have reviewed the patient's current medications.   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  Plasma cells show kappa light chain restriction consistent with plasma cell neoplasm.  Background shows trilineage  hematopoiesis with nonspecific changes.  Normal cytogenetics.  FISH with no evidence of abnormalities. IgA kappa monoclonal protein, elevated free kappa light chains 09/06/2022 bone survey-no radiographic evidence of multiple myeloma 12/04/2022 myeloma labs stable to improved 07/25/2023-myeloma labs stable Mild leukopenia on CBC 09/27/2020, normal on 10/18/2020, 06/21/2021, 201 2024 History of B12 deficiency Hypertension Hyperlipidemia Severe AS status post TAVR April 2019 Nonobstructive CAD Thoracic aortic aneurysm Back pain-MRI lumbar spine 12/04/2022 with mildly progressive foraminal narrowing bilaterally at L4-5 and on the left at L5-S1 and S1-2; stable enhancing lesion posteriorly L4 vertebral body; additional small foci of enhancement within the lumbar spine and upper sacrum bilaterally indeterminate for recurrent/progressive myeloma; no evidence of pathologic fracture.        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  He appears to have indolent disease.  He is asymptomatic and stable from a hematologic standpoint.  The plan is to continue observation.  He will return for a lab visit and bone survey in 6 months.  He will remain up-to-date on the influenza vaccine.  He received the pneumococcal 20 vaccine last year.  Arley Hof, MD  01/21/2024  11:46 AM

## 2024-02-05 DIAGNOSIS — M545 Low back pain, unspecified: Secondary | ICD-10-CM | POA: Diagnosis not present

## 2024-02-06 NOTE — Progress Notes (Unsigned)
 No chief complaint on file.  History of Present Illness: 89 yo male with history of HTN, HLD, dilated aortic root and severe aortic stenosis s/p TAVR April 2019 who is here today for follow up. He had rheumatic fever as a child and was followed for aortic stenosis for many years. Echo 07/12/17 showed normal LV systolic function with LVEF=60-65% and severe aortic stenosis. His aortic root was enlarged. Chest CTA 07/23/17 with dilated ascending thoracic aorta with proximal to mid ascending thoracic aorta up to 4.6 cm. Cardiac cath February 2019 with mild non-obstructive CAD. He had TAVR 09/25/17 with placement of a 29 mm Edwards Sapien 3 valve from the transfemoral approach. Echo May 2019 with normal LV systolic function and normally functioning bioprosthetic AVR with mild perivalvular AI. Echo November 2021 with LVEF=50%. Elevated gradient across aortic bioprosthetic valve. Gated cardiac CTA December 2021 with stable ascending aortic aneurysm around 4.6-4.8 cm. Mild CAD. Edwards Sapien 3 THV with hypo-attenuating leaflet thickening (HALT) and hypo attenuation affecting motion (HAM). Coumadin  was started. Echo February 2022 with AV gradient down to 10 mmHg. LVEF=55-60%. His coumadin  was stopped after six months. Echo in June 2022 with elevated aortic valve gradient 19 mmHg. Coumadin  was restarted. Echo May 2023 with LVEF=60-65%. The AVR is working well with trivial AI, mean gradient 7 mmHg.  Chest MRA June 2023 with stable 4.6 cm ascending aortic aneurysm.  He is here today for follow up. The patient denies any chest pain, dyspnea, palpitations, lower extremity edema, orthopnea, PND, dizziness, near syncope or syncope.     Primary Care Physician: Katrinka Garnette KIDD, MD  Past Medical History:  Diagnosis Date   Aortic atherosclerosis [facility])    Aortic valve disease    a. severe aortic stenosis s/p TAVR 09/2017.   Arthritis    spinal (11/16/2015)   Atypical nevus 05/14/2013   moderate atypia - left scapula    Colon cancer (HCC)    precancer-partial colectomy   Dilated aortic root (HCC)    Elevated HDL    First degree AV block    First degree AV block    Gout    Heart murmur    History of kidney stones    Hyperlipidemia    Hypertension    LBBB (left bundle branch block)    Nonobstructive atherosclerosis of coronary artery    Squamous cell skin cancer 08/04/2011   Left outer brow - CX3 + 5FU   Thoracic aortic aneurysm [facility])     Past Surgical History:  Procedure Laterality Date   BACK SURGERY     CATARACT EXTRACTION W/ INTRAOCULAR LENS  IMPLANT, BILATERAL Bilateral 2017   COLONOSCOPY W/ POLYPECTOMY     FACET JOINT INJECTION  2009   IR BONE MARROW BIOPSY & ASPIRATION  08/24/2022   LITHOTRIPSY  2010   LUMBAR LAMINECTOMY  ~ 1992   PARTIAL COLECTOMY  1997   precancerours   PARTIAL KNEE ARTHROPLASTY Left 11/16/2015   Procedure: LEFT KNEE UNICOMPARTMENTAL ;  Surgeon: Evalene JONETTA Chancy, MD;  Location: [facility] OR;  Service: Orthopedics;  Laterality: Left;   REPLACEMENT UNICONDYLAR JOINT KNEE Left 11/16/2015   RIGHT/LEFT HEART CATH AND CORONARY ANGIOGRAPHY N/A 08/10/2017   Procedure: RIGHT/LEFT HEART CATH AND CORONARY ANGIOGRAPHY;  Surgeon: Verlin Lonni JONETTA, MD;  Location: [facility] INVASIVE CV LAB;  Service: Cardiovascular;  Laterality: N/A;   TEE WITHOUT CARDIOVERSION N/A 09/25/2017   Procedure: TRANSESOPHAGEAL ECHOCARDIOGRAM (TEE);  Surgeon: Verlin Lonni JONETTA, MD;  Location: [facility] OR;  Service: Open Heart Surgery;  Laterality: N/A;   TONSILLECTOMY     TRANSCATHETER AORTIC VALVE REPLACEMENT, TRANSFEMORAL N/A 09/25/2017   Procedure: TRANSCATHETER AORTIC VALVE REPLACEMENT, TRANSFEMORAL;  Surgeon: Verlin Lonni BIRCH, MD;  Location: [facility] OR;  Service: Open Heart Surgery;  Laterality: N/A;   TYMPANOPLASTY Left 1998   ruptured ear drum with skin graft    Current Outpatient Medications  Medication Sig Dispense Refill   acetaminophen  (TYLENOL ) 650 MG CR tablet Take 650 mg by mouth every 8 (eight) hours  as needed for pain.     allopurinol  (ZYLOPRIM ) 300 MG tablet TAKE 1 TABLET BY MOUTH EVERY DAY 90 tablet 3   amLODipine  (NORVASC ) 10 MG tablet TAKE 1 TABLET BY MOUTH EVERY DAY 90 tablet 3   amoxicillin  (AMOXIL ) 500 MG capsule TAKE 4 CAPS BY MOUTH AS DIRECTED 1 HOUR PRIOR TO DENTAL WORK INCLUDING CLEANINGS (Patient not taking: Reported on 01/21/2024) 8 capsule 2   Ascorbic Acid (VITAMIN C) 1000 MG tablet Take 1,000 mg by mouth daily.     cyanocobalamin  1000 MCG tablet Take 1,000 mcg by mouth daily.     FLUAD 0.5 ML injection      furosemide  (LASIX ) 20 MG tablet Take 1 tablet (20 mg total) by mouth daily. 30 tablet 0   lisinopril  (ZESTRIL ) 20 MG tablet TAKE 1 TABLET BY MOUTH EVERY DAY 90 tablet 3   omeprazole  (PRILOSEC) 40 MG capsule TAKE 1 CAPSULE (40 MG TOTAL) BY MOUTH DAILY. 90 capsule 3   potassium citrate  (UROCIT-K ) 10 MEQ (1080 MG) SR tablet Take 1 tablet (10 mEq total) by mouth 3 (three) times daily with meals. 300 tablet 1   PREVNAR 20 0.5 ML injection      rosuvastatin  (CRESTOR ) 20 MG tablet TAKE 1 TABLET BY MOUTH ONE TIME PER WEEK 13 tablet 3   SPIKEVAX syringe      triamcinolone  cream (KENALOG) 0.1 % Apply 1 Application topically as needed.     warfarin (COUMADIN ) 5 MG tablet TAKE 1 TO 1&1/2 TABLETS ONCE DAILY BY MOUTH AS DIRECTED BY ANTICOAGULATION CLINIC 135 tablet 1   No current facility-administered medications for this visit.    No Known Allergies  Social History   Socioeconomic History   Marital status: Married    Spouse name: Not on file   Number of children: 5   Years of education: Not on file   Highest education level: Not on file  Occupational History   Occupation: Administrator, Civil Service  Tobacco Use   Smoking status: Former    Current packs/day: 0.00    Average packs/day: 1 pack/day for 20.0 years (20.0 ttl pk-yrs)    Types: Cigarettes    Start date: 09/15/1960    Quit date: 09/15/1980    Years since quitting: 43.4   Smokeless tobacco: Never  Vaping Use    Vaping status: Never Used  Substance and Sexual Activity   Alcohol use: Yes    Alcohol/week: 7.0 standard drinks of alcohol    Types: 1 Glasses of wine, 1 Shots of liquor, 5 Standard drinks or equivalent per week   Drug use: No   Sexual activity: Never  Other Topics Concern   Not on file  Social History Narrative   Married. 5 children. 4 grandkids. 1 greatgrandkid.       Retired from Corporate investment banker in Museum/gallery curator.       Hobbies: woodworking      Advised to consider advanced directives/hcpoa-thinks wife may have HCPOA from years ago.   Social Drivers of Health  Financial Resource Strain: Low Risk  (07/04/2023)   Overall Financial Resource Strain (CARDIA)    Difficulty of Paying Living Expenses: Not hard at all  Food Insecurity: No Food Insecurity (07/04/2023)   Hunger Vital Sign    Worried About Running Out of Food in the Last Year: Never true    Ran Out of Food in the Last Year: Never true  Transportation Needs: No Transportation Needs (07/04/2023)   PRAPARE - Administrator, Civil Service (Medical): No    Lack of Transportation (Non-Medical): No  Physical Activity: Sufficiently Active (07/04/2023)   Exercise Vital Sign    Days of Exercise per Week: 5 days    Minutes of Exercise per Session: 30 min  Stress: No Stress Concern Present (07/04/2023)   [employer] of Occupational Health - Occupational Stress Questionnaire    Feeling of Stress : Not at all  Social Connections: Socially Integrated (07/04/2023)   Social Connection and Isolation Panel    Frequency of Communication with Friends and Family: Three times a week    Frequency of Social Gatherings with Friends and Family: Three times a week    Attends Religious Services: More than 4 times per year    Active Member of Clubs or Organizations: Yes    Attends Banker Meetings: 1 to 4 times per year    Marital Status: Married  Catering manager Violence: Not At Risk (07/04/2023)    Humiliation, Afraid, Rape, and Kick questionnaire    Fear of Current or Ex-Partner: No    Emotionally Abused: No    Physically Abused: No    Sexually Abused: No    Family History  Problem Relation Age of Onset   Heart attack Mother 65       former smoker   Heart attack Father 87       former smoker   Hypertension Sister    Heart attack Brother 26       Rheumatic fever    Review of Systems:  As stated in the HPI and otherwise negative.   There were no vitals taken for this visit.  Physical Examination: General: Well developed, well nourished, NAD  HEENT: OP clear, mucus membranes moist  SKIN: warm, dry. No rashes. Neuro: No focal deficits  Musculoskeletal: Muscle strength 5/5 all ext  Psychiatric: Mood and affect normal  Neck: No JVD, no carotid bruits, no thyromegaly, no lymphadenopathy.  Lungs:Clear bilaterally, no wheezes, rhonci, crackles Cardiovascular: Regular rate and rhythm. No murmurs, gallops or rubs. Abdomen:Soft. Bowel sounds present. Non-tender.  Extremities: No lower extremity edema. Pulses are 2 + in the bilateral DP/PT.  EKG:  EKG is *** ordered today. The ekg ordered today demonstrates   Recent Labs: 01/14/2024: ALT 13; BUN 23; Creatinine 1.08; Hemoglobin 11.0; Platelet Count 153; Potassium 4.3; Sodium 143   Lipid Panel    Component Value Date/Time   CHOL 162 04/04/2022 1057   TRIG 121.0 04/04/2022 1057   TRIG 87 06/22/2006 1013   HDL 53.90 04/04/2022 1057   CHOLHDL 3 04/04/2022 1057   VLDL 24.2 04/04/2022 1057   LDLCALC 84 04/04/2022 1057   LDLCALC 108 (H) 03/26/2020 1128   LDLDIRECT 99.0 11/04/2021 0915     Wt Readings from Last 3 Encounters:  01/21/24 224 lb 8 oz (101.8 kg)  08/01/23 232 lb (105.2 kg)  07/04/23 234 lb (106.1 kg)    Assessment and Plan:   1. Severe AS: He is s/p TAVR. He has had evidence of HALT and  HAM so is now on coumadin . Mean gradient across the aortic valve yesterday was 7 mmHg. Continue coumadin . No ASA since he is  on coumain.  Continue SBE prophylaxis as needed.    2. HTN: BP is well controlled. No changes today  3. Chronic diastolic CHF: Weight is stable. No volume overload on exam.Continue Lasix .   4. Thoracic aortic aneurysm: Stable 4.6 cm ascending aortic aneurysm by chest MRA June 2023. Repeat chest CTA now.   5. LBBB: New post TAVR. No dizziness.    Labs/ tests ordered today include:   No orders of the defined types were placed in this encounter.   Disposition:  Follow up six months.    Signed, Lonni Cash, MD 02/06/2024 1:01 PM    [facility] [address] [place], [place], [place]  [address] Phone: [phone]; Fax: [phone]

## 2024-02-07 ENCOUNTER — Ambulatory Visit: Attending: Cardiovascular Disease | Admitting: Cardiovascular Disease

## 2024-02-07 ENCOUNTER — Ambulatory Visit: Attending: Cardiovascular Disease

## 2024-02-07 VITALS — BP 120/60 | HR 58 | Ht 72.0 in | Wt 219.0 lb

## 2024-02-07 DIAGNOSIS — I5032 Chronic diastolic (congestive) heart failure: Secondary | ICD-10-CM

## 2024-02-07 DIAGNOSIS — I2510 Atherosclerotic heart disease of native coronary artery without angina pectoris: Secondary | ICD-10-CM

## 2024-02-07 DIAGNOSIS — Z953 Presence of xenogenic heart valve: Secondary | ICD-10-CM

## 2024-02-07 DIAGNOSIS — I10 Essential (primary) hypertension: Secondary | ICD-10-CM

## 2024-02-07 DIAGNOSIS — I8290 Acute embolism and thrombosis of unspecified vein: Secondary | ICD-10-CM

## 2024-02-07 DIAGNOSIS — I447 Left bundle-branch block, unspecified: Secondary | ICD-10-CM

## 2024-02-07 DIAGNOSIS — Z5181 Encounter for therapeutic drug level monitoring: Secondary | ICD-10-CM

## 2024-02-07 DIAGNOSIS — I7121 Aneurysm of the ascending aorta, without rupture: Secondary | ICD-10-CM

## 2024-02-07 LAB — POCT INR: INR: 3 (ref 2.0–3.0)

## 2024-02-07 NOTE — Progress Notes (Signed)
 INR 3.0; Please see anticoagulation encounter

## 2024-02-07 NOTE — Patient Instructions (Signed)
 Description   Continue taking warfarin 1 tablet daily except for 1.5 tablets on Sunday, Tuesday, and Thursday. Recheck INR in 8 weeks.  Coumadin  Clinic [phone], Main number [phone].

## 2024-02-07 NOTE — Patient Instructions (Signed)
 Medication Instructions:  No changes *If you need a refill on your cardiac medications before your next appointment, please call your pharmacy*  Lab Work: none If you have labs (blood work) drawn today and your tests are completely normal, you will receive your results only by: MyChart Message (if you have MyChart) OR A paper copy in the mail If you have any lab test that is abnormal or we need to change your treatment, we will call you to review the results.  Testing/Procedures: none  Follow-Up: At [facility], you and your health needs are our priority.  As part of our continuing mission to provide you with exceptional heart care, our providers are all part of one team.  This team includes your primary Cardiologist (physician) and Advanced Practice Providers or APPs (Physician Assistants and Nurse Practitioners) who all work together to provide you with the care you need, when you need it.  Your next appointment:   12 month(s)  Provider:   Antoinette Batman, MD

## 2024-02-25 ENCOUNTER — Other Ambulatory Visit: Payer: Self-pay | Admitting: Family Medicine

## 2024-02-25 ENCOUNTER — Telehealth: Payer: Self-pay | Admitting: Family Medicine

## 2024-02-25 MED ORDER — COVID-19 MRNA VACC (MODERNA) 50 MCG/0.5ML IM SUSP: 0.5000 mL | Freq: Once | INTRAMUSCULAR | 0 refills | Status: AC

## 2024-02-25 NOTE — Telephone Encounter (Signed)
 Called patient and made him aware we sent the script over to [facility] at [address]. Pt verbalized understanding and no further questions at this time.

## 2024-02-25 NOTE — Telephone Encounter (Signed)
 Yes thanks-reasonable for him to receive this-you can send in prescription to pharmacy

## 2024-02-25 NOTE — Telephone Encounter (Signed)
 Please see patient message and advise if covid shot is recommended with his cancer diagnosis.    Copied from CRM [id number]. Topic: Clinical - Medical Advice >> Feb 25, 2024 10:53 AM Franky GRADE wrote: Reason for CRM: Patient's wife is calling to see if Dr.Hunter recommends patient getting the new covid vaccine due to the type of cancer he has. He spoke to the pharmacy and they advised that if they decide to get the new covid vaccine they would also need an order from his primary care provider .

## 2024-03-22 ENCOUNTER — Other Ambulatory Visit: Payer: Self-pay | Admitting: Family Medicine

## 2024-03-25 ENCOUNTER — Ambulatory Visit (INDEPENDENT_AMBULATORY_CARE_PROVIDER_SITE_OTHER): Admitting: Family Medicine

## 2024-03-25 ENCOUNTER — Encounter: Payer: Self-pay | Admitting: Family Medicine

## 2024-03-25 VITALS — BP 112/68 | HR 68 | Temp 98.0°F | Ht 72.0 in | Wt 215.4 lb

## 2024-03-25 DIAGNOSIS — I10 Essential (primary) hypertension: Secondary | ICD-10-CM

## 2024-03-25 DIAGNOSIS — I5032 Chronic diastolic (congestive) heart failure: Secondary | ICD-10-CM | POA: Diagnosis not present

## 2024-03-25 DIAGNOSIS — J189 Pneumonia, unspecified organism: Secondary | ICD-10-CM | POA: Diagnosis not present

## 2024-03-25 DIAGNOSIS — E7800 Pure hypercholesterolemia, unspecified: Secondary | ICD-10-CM

## 2024-03-25 MED ORDER — AMOXICILLIN-POT CLAVULANATE 875-125 MG PO TABS: 1.0000 | ORAL_TABLET | Freq: Two times a day (BID) | ORAL | 0 refills | Status: AC

## 2024-03-25 MED ORDER — DOXYCYCLINE HYCLATE 100 MG PO TABS: 100.0000 mg | ORAL_TABLET | Freq: Two times a day (BID) | ORAL | 0 refills | Status: AC

## 2024-03-25 NOTE — Patient Instructions (Addendum)
 I am hearing crackles in the right lung base concerning for pneumonia with worsening cough over last 5 days with 3 weeks of cough prior to that. We are going to treat with 2 antibiotics Augmentin and doxycycline for 7 days. I want of the see you back sooner if symptoms worsen but hoping for improvement within 48 hours. If you are improving gradually follow up with me next week for update- can send me a message. If worsen at any point or improvements stagnate id like to get an x-ray which we held off on today  Recommended follow up: Return for as needed for new, worsening, persistent symptoms.

## 2024-03-25 NOTE — Progress Notes (Signed)
 Phone [phone] In person visit   Subjective:   Barry Decker is a 89 y.o. year old very pleasant male patient who presents for/with See problem oriented charting Chief Complaint  Patient presents with   Cough    At home flu and covid test negative yesterday Congestion, fatigue; started 5 days ago; no fever; has been taking nyquil and mucinex    Past Medical History-  Patient Active Problem List   Diagnosis Date Noted   Multiple myeloma (HCC) 11/21/2022    Priority: High   Thrombus 05/31/2020    Priority: High   Ascending aortic aneurysm 03/26/2020    Priority: High   Status post transcatheter aortic valve replacement (TAVR) using bioprosthesis 09/25/2017    Priority: High   Aortic atherosclerosis 03/26/2020    Priority: Medium    B12 deficiency 09/23/2019    Priority: Medium    Chronic diastolic CHF (congestive heart failure) (HCC) 03/30/2017    Priority: Medium    HYPERCHOLESTEROLEMIA WITH HIGH HDL 01/31/2007    Priority: Medium    Gout 01/31/2007    Priority: Medium    Essential hypertension 01/31/2007    Priority: Medium    History of malignant neoplasm of large intestine 01/31/2007    Priority: Medium    Osteoarthritis of cervical spine 08/31/2015    Priority: Low   Osteoarthritis of left knee 05/25/2014    Priority: Low   Medication management 12/02/2020   Encounter for therapeutic drug monitoring 05/31/2020   Macrocytic anemia 03/21/2019   Kidney stones    Throat pain in adult 08/31/2015    Medications- reviewed and updated Current Outpatient Medications  Medication Sig Dispense Refill   acetaminophen  (TYLENOL ) 650 MG CR tablet Take 650 mg by mouth every 8 (eight) hours as needed for pain.     allopurinol  (ZYLOPRIM ) 300 MG tablet TAKE 1 TABLET BY MOUTH EVERY DAY 90 tablet 3   amLODipine  (NORVASC ) 10 MG tablet TAKE 1 TABLET BY MOUTH EVERY DAY 90 tablet 3   amoxicillin  (AMOXIL ) 500 MG capsule TAKE 4 CAPS BY MOUTH AS DIRECTED 1 HOUR PRIOR TO DENTAL WORK  INCLUDING CLEANINGS 8 capsule 2   amoxicillin -clavulanate (AUGMENTIN) 875-125 MG tablet Take 1 tablet by mouth 2 (two) times daily for 7 days. 14 tablet 0   Ascorbic Acid (VITAMIN C) 1000 MG tablet Take 1,000 mg by mouth daily.     cyanocobalamin  1000 MCG tablet Take 1,000 mcg by mouth daily.     doxycycline (VIBRA-TABS) 100 MG tablet Take 1 tablet (100 mg total) by mouth 2 (two) times daily for 7 days. 14 tablet 0   furosemide  (LASIX ) 20 MG tablet Take 1 tablet (20 mg total) by mouth daily. 30 tablet 0   lisinopril  (ZESTRIL ) 20 MG tablet TAKE 1 TABLET BY MOUTH EVERY DAY 90 tablet 3   omeprazole  (PRILOSEC) 40 MG capsule TAKE 1 CAPSULE (40 MG TOTAL) BY MOUTH DAILY. 90 capsule 3   potassium citrate  (UROCIT-K ) 10 MEQ (1080 MG) SR tablet Take 1 tablet (10 mEq total) by mouth 3 (three) times daily with meals. 300 tablet 1   rosuvastatin  (CRESTOR ) 20 MG tablet TAKE 1 TABLET BY MOUTH ONE TIME PER WEEK 13 tablet 3   triamcinolone  cream (KENALOG) 0.1 % Apply 1 Application topically as needed.     warfarin (COUMADIN ) 5 MG tablet TAKE 1 TO 1&1/2 TABLETS ONCE DAILY BY MOUTH AS DIRECTED BY ANTICOAGULATION CLINIC 135 tablet 1   No current facility-administered medications for this visit.  Objective:  BP 112/68 (BP Location: Left Arm, Patient Position: Sitting, Cuff Size: Normal)   Pulse 68   Temp 98 F (36.7 C) (Temporal)   Ht 6' (1.829 m)   Wt 215 lb 6.4 oz (97.7 kg)   SpO2 96%   BMI 29.21 kg/m  Gen: NAD, resting comfortably Tympanic membrane is normal bilaterally, nasal turbinates edematous and slightly erythematous with yellow discharge noted, pharynx largely normal CV: RRR no murmurs rubs or gallops Lungs: CTAB other than right lower lobe persistent crackles despite coughing Abdomen: soft/nontender/nondistended/normal bowel sounds. No rebound or guarding.  Ext: no edema Skin: warm, dry     Assessment and Plan   # Cough/congestion S: Home flu and COVID test negative yesterday.  Has had  about 5 days of symptoms including nasal and chest congestion, fatigue.  No fever.  Taking NyQuil and Mucinex with some relief.  No sore throat. Mild improvement in last 24 hours with meds.  Morning cough has been particularly bad. Wet cough with greenish brown mucus.  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 Cloretta.  Last visit was in August A/P: I am hearing crackles in the right lung base concerning for pneumonia with worsening cough over last 5 days with 3 weeks of cough prior to that. We are going to treat with 2 antibiotics Augmentin and doxycycline for 7 days. I want of the see you back sooner if symptoms worsen but hoping for improvement within 48 hours. If you are improving gradually follow up with me next week for update- can send me a message. If worsen at any point or improvements stagnate id like to get an x-ray which we held off on today -discussed 5 day option as likely adequate but after discussion with he and wife preferred 7 days. Doxycycline should cover potential of walking pneumonia given initial 3 week course. 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 Cloretta.  Reassuring report in August. Wt Readings from Last 3 Encounters:  03/25/24 215 lb 6.4 oz (97.7 kg)  02/07/24 219 lb (99.3 kg)  01/21/24 224 lb 8 oz (101.8 kg)    # Chronic diastolic CHF-remains on Lasix  20 mg daily.  Weight actually trending down so doubt this represents fluid overload.  Tolerates amlodipine  which is not ideal but needed for blood pressure control   #Nonobstructive CAD and aortic atherosclerosis #hyperlipidemia S:  Medication:Rosuvastatin  20 mg once a week Symptoms: No chest pain shortness of breath reported Lab Results  Component Value Date   CHOL 162 04/04/2022   HDL 53.90 04/04/2022   LDLCALC 84 04/04/2022   LDLDIRECT 99.0 11/04/2021   TRIG 121.0 04/04/2022   CHOLHDL 3 04/04/2022  A/P: Lipids have been above ideal goal given nonobstructive CAD-would like to update LDL The next time We perform labs  #hypertension S: medication: Amlodipine  10 mg, Lasix  20 mg, lisinopril  20 mg  -prior hctz 12.5 mg but cr worsened and BUN high A/P: Blood pressure well-controlled-continue current medication    Recommended follow up: Return for as needed for new, worsening, persistent symptoms. Future Appointments  Date Time Provider Department Center  04/03/2024 10:00 AM [facility] COUMADIN  CLINIC 2 [facility] H&V  07/08/2024  9:20 AM [facility] ANNUAL WELLNESS VISIT 1 [facility] [name]  07/22/2024 10:30 AM [facility] PHLEBOTOMIST [facility] None  07/23/2024 11:40 AM Cloretta Arley NOVAK, MD [facility] None    Lab/Order associations:   ICD-10-CM   1. Pneumonia of right lower lobe due to infectious organism  J18.9     2. Essential hypertension  I10     3. Chronic diastolic CHF (congestive heart failure) (HCC)  I50.32     4. HYPERCHOLESTEROLEMIA WITH HIGH HDL  E78.00       Meds ordered this encounter  Medications   amoxicillin -clavulanate (AUGMENTIN) 875-125 MG tablet    Sig: Take 1 tablet by mouth 2 (two) times daily for 7 days.    Dispense:  14 tablet    Refill:  0   doxycycline (VIBRA-TABS) 100 MG tablet    Sig: Take 1 tablet (100 mg total) by mouth 2 (two) times daily for 7 days.    Dispense:  14 tablet    Refill:  0    Return precautions advised.  Garnette Lukes, MD

## 2024-03-31 ENCOUNTER — Encounter: Payer: Self-pay | Admitting: Oncology

## 2024-04-03 ENCOUNTER — Ambulatory Visit (INDEPENDENT_AMBULATORY_CARE_PROVIDER_SITE_OTHER): Admitting: Family Medicine

## 2024-04-03 ENCOUNTER — Ambulatory Visit: Attending: Cardiovascular Disease

## 2024-04-03 ENCOUNTER — Encounter: Payer: Self-pay | Admitting: Family Medicine

## 2024-04-03 VITALS — BP 118/62 | HR 58 | Temp 98.1°F | Ht 72.0 in | Wt 217.8 lb

## 2024-04-03 DIAGNOSIS — E7800 Pure hypercholesterolemia, unspecified: Secondary | ICD-10-CM

## 2024-04-03 DIAGNOSIS — Z5181 Encounter for therapeutic drug level monitoring: Secondary | ICD-10-CM

## 2024-04-03 DIAGNOSIS — I10 Essential (primary) hypertension: Secondary | ICD-10-CM | POA: Diagnosis not present

## 2024-04-03 DIAGNOSIS — J189 Pneumonia, unspecified organism: Secondary | ICD-10-CM

## 2024-04-03 DIAGNOSIS — Z79899 Other long term (current) drug therapy: Secondary | ICD-10-CM | POA: Diagnosis not present

## 2024-04-03 DIAGNOSIS — I8290 Acute embolism and thrombosis of unspecified vein: Secondary | ICD-10-CM

## 2024-04-03 LAB — POCT INR: INR: 4.3 — AB (ref 2.0–3.0)

## 2024-04-03 NOTE — Progress Notes (Signed)
 INR 4.3  Please see anticoagulation encounter Hold tomorrow only then Continue taking warfarin 1 tablet daily except for 1.5 tablets on Sunday, Tuesday, and Thursday. Recheck INR in 3 weeks.  Coumadin  Clinic [phone], Main number [phone].

## 2024-04-03 NOTE — Progress Notes (Signed)
 Phone [phone] In person visit   Subjective:   Barry Decker is a 89 y.o. year old very pleasant male patient who presents for/with See problem oriented charting Chief Complaint  Patient presents with   Pneumonia    Follow up from 03/25/2024;    Past Medical History-  Patient Active Problem List   Diagnosis Date Noted   Multiple myeloma (HCC) 11/21/2022    Priority: High   Thrombus 05/31/2020    Priority: High   Ascending aortic aneurysm 03/26/2020    Priority: High   Status post transcatheter aortic valve replacement (TAVR) using bioprosthesis 09/25/2017    Priority: High   Aortic atherosclerosis 03/26/2020    Priority: Medium    B12 deficiency 09/23/2019    Priority: Medium    Chronic diastolic CHF (congestive heart failure) (HCC) 03/30/2017    Priority: Medium    HYPERCHOLESTEROLEMIA WITH HIGH HDL 01/31/2007    Priority: Medium    Gout 01/31/2007    Priority: Medium    Essential hypertension 01/31/2007    Priority: Medium    History of malignant neoplasm of large intestine 01/31/2007    Priority: Medium    Osteoarthritis of cervical spine 08/31/2015    Priority: Low   Osteoarthritis of left knee 05/25/2014    Priority: Low   Medication management 12/02/2020   Encounter for therapeutic drug monitoring 05/31/2020   Macrocytic anemia 03/21/2019   Kidney stones    Throat pain in adult 08/31/2015    Medications- reviewed and updated Current Outpatient Medications  Medication Sig Dispense Refill   acetaminophen  (TYLENOL ) 650 MG CR tablet Take 650 mg by mouth every 8 (eight) hours as needed for pain.     allopurinol  (ZYLOPRIM ) 300 MG tablet TAKE 1 TABLET BY MOUTH EVERY DAY 90 tablet 3   amLODipine  (NORVASC ) 10 MG tablet TAKE 1 TABLET BY MOUTH EVERY DAY 90 tablet 3   Ascorbic Acid (VITAMIN C) 1000 MG tablet Take 1,000 mg by mouth daily.     cyanocobalamin  1000 MCG tablet Take 1,000 mcg by mouth daily.     furosemide  (LASIX ) 20 MG tablet Take 1 tablet (20 mg  total) by mouth daily. 30 tablet 0   lisinopril  (ZESTRIL ) 20 MG tablet TAKE 1 TABLET BY MOUTH EVERY DAY 90 tablet 3   omeprazole  (PRILOSEC) 40 MG capsule TAKE 1 CAPSULE (40 MG TOTAL) BY MOUTH DAILY. 90 capsule 3   potassium citrate  (UROCIT-K ) 10 MEQ (1080 MG) SR tablet Take 1 tablet (10 mEq total) by mouth 3 (three) times daily with meals. 300 tablet 1   rosuvastatin  (CRESTOR ) 20 MG tablet TAKE 1 TABLET BY MOUTH ONE TIME PER WEEK 13 tablet 3   triamcinolone  cream (KENALOG) 0.1 % Apply 1 Application topically as needed.     warfarin (COUMADIN ) 5 MG tablet TAKE 1 TO 1&1/2 TABLETS ONCE DAILY BY MOUTH AS DIRECTED BY ANTICOAGULATION CLINIC 135 tablet 1   amoxicillin  (AMOXIL ) 500 MG capsule TAKE 4 CAPS BY MOUTH AS DIRECTED 1 HOUR PRIOR TO DENTAL WORK INCLUDING CLEANINGS (Patient not taking: Reported on 04/03/2024) 8 capsule 2   No current facility-administered medications for this visit.     Objective:  BP 118/62 (BP Location: Left Arm, Patient Position: Sitting, Cuff Size: Normal)   Pulse (!) 58   Temp 98.1 F (36.7 C) (Temporal)   Ht 6' (1.829 m)   Wt 217 lb 12.8 oz (98.8 kg)   SpO2 94%   BMI 29.54 kg/m  Gen: appears back to his normal state-  no longer fatigued appearing like last week CV: RRR no murmurs rubs or gallops Lungs: CTAB no crackles, wheeze, rhonchi- prior crackles cleared at right lung base Abdomen: soft/nontender/nondistended/normal bowel sounds. No rebound or guarding.  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  At that point he had 5 days of symptoms including nasal and chest congestion and significant fatigue.  He actually has some cough for 3 weeks prior to today that worsened 5 days prior to our visit  On exam I heard crackles in the right lung base concerning for pneumonia and treated with  Augmentin and doxycycline.  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  He been given the option of follow-up visit and he wanted to come in and discuss further  Patient reports significant improvement in symptoms with cough and congestion drastically better other than some mild lingering nighttime cough and he felt much better within about 48 hours.  Fatigue is much improved A/P: Pneumonia appears to be clear at this point.  Her crackles have resolved.  We discussed possibility of chest x-ray I have asked about confirming diagnosis and I stated we could but would need follow-up x-ray probably for 6 weeks out since pneumonia can persist on imaging for several weeks even after treatment- we ultimately jointly opted out.  He also asked great questions about being out in public and how to protect him.  We discussed perhaps being cautious for another week before extending themselves back out into community and in general being cautious with respiratory season but balancing that with importance of quality of life    #Nonobstructive CAD and aortic atherosclerosis #hyperlipidemia S: Medication:Rosuvastatin  20 mg once a week Symptoms:no chest pain or shortness of breath   Lab Results  Component Value Date   CHOL 162 04/04/2022   HDL 53.90 04/04/2022   LDLCALC 84 04/04/2022   LDLDIRECT 99.0 11/04/2021   TRIG 121.0 04/04/2022   CHOLHDL 3 04/04/2022  A/P: coronary artery disease asymptomatic but nonobstructive -encouraged him to schedule 6 month physical so we can update lipid panel and other labs- he agrees  #hypertension S: medication: Amlodipine  10 mg, Lasix  20 mg, lisinopril  20 mg  A/P: blood pressure well controlled continue current medications    Recommended follow up: Return in about 6 months (around 10/02/2024) for physical or sooner if needed.Schedule b4 you leave. Future Appointments  Date Time Provider Department Center  04/21/2024 10:15 AM [facility] COUMADIN   CLINIC 2 [facility] H&[facility]  07/08/2024  9:20 AM [facility] ANNUAL WELLNESS VISIT 1 [facility] Barry Decker  07/22/2024 10:30 AM [facility] PHLEBOTOMIST [facility] None  07/23/2024 11:40 AM Cloretta Arley NOVAK, MD [facility] None  10/07/2024 10:20 AM Katrinka Garnette KIDD, MD [facility]    Lab/Order associations: No diagnosis found.  No orders of the defined types were placed in this encounter.   Return precautions advised.  Garnette Katrinka, MD

## 2024-04-03 NOTE — Patient Instructions (Addendum)
 Lungs sound great- pneumonia appears affectively treated but if you have new or worsening symptoms please return to see me  Still be cautious over next week as far as exposures but can gradually build after that if continue to feel well  Recommended follow up: Return in about 6 months (around 10/02/2024) for physical or sooner if needed.Schedule b4 you leave.

## 2024-04-03 NOTE — Patient Instructions (Signed)
 Hold tomorrow only then Continue taking warfarin 1 tablet daily except for 1.5 tablets on Sunday, Tuesday, and Thursday. Recheck INR in 3 weeks.  Coumadin  Clinic [phone], Main number [phone].

## 2024-04-08 ENCOUNTER — Ambulatory Visit: Admitting: Family Medicine

## 2024-04-21 ENCOUNTER — Ambulatory Visit: Attending: Cardiovascular Disease

## 2024-04-21 DIAGNOSIS — I8290 Acute embolism and thrombosis of unspecified vein: Secondary | ICD-10-CM | POA: Diagnosis not present

## 2024-04-21 DIAGNOSIS — Z79899 Other long term (current) drug therapy: Secondary | ICD-10-CM

## 2024-04-21 DIAGNOSIS — Z5181 Encounter for therapeutic drug level monitoring: Secondary | ICD-10-CM | POA: Diagnosis not present

## 2024-04-21 LAB — POCT INR: INR: 3 (ref 2.0–3.0)

## 2024-04-21 NOTE — Progress Notes (Signed)
 INR 3.0 Please see anticoagulation encounter Continue taking warfarin 1 tablet daily except for 1.5 tablets on Sunday, Tuesday, and Thursday. Recheck INR in 6 weeks.  Coumadin  Clinic [phone], Main number [phone].

## 2024-04-21 NOTE — Patient Instructions (Signed)
 Continue taking warfarin 1 tablet daily except for 1.5 tablets on Sunday, Tuesday, and Thursday. Recheck INR in 6 weeks.  Coumadin  Clinic [phone], Main number [phone].

## 2024-05-05 ENCOUNTER — Encounter: Payer: Self-pay | Admitting: Oncology

## 2024-05-06 ENCOUNTER — Telehealth: Payer: Self-pay

## 2024-05-06 NOTE — Telephone Encounter (Signed)
 Followed up with patient regarding his MyChart message expressing concern over right low rib cage pain. Patient reported that the pain was 5-6/10 upon onset on pain scale 4-5 weeks ago. Currently, he is reporting 4/10 on pain scale so his pain has improved. Patient did remember during phone call that he had pneumonia around the onset of this pain and had been coughing a lot and believes now that he may have pulled something. Patient was made aware that he could have Xray per Dr. Cloretta for Hx of Myeloma and Lower Right Rib Pain. Patient denied having a fall. Patient decided to decline the Xray for now but agreed to reach back out if the pain got worse again or continued to concern him. Patient understood all instructions and agreed.  In addition, during the phone conversation, patient was advised to come to the [facility] location on or before January 28th, 2026 for a Bone Survey Scan. Patient agreed and understands that this is ordered by Dr. Cloretta and does not need an appointment and that he can just walk in.

## 2024-05-24 ENCOUNTER — Other Ambulatory Visit: Payer: Self-pay | Admitting: Cardiovascular Disease

## 2024-05-26 ENCOUNTER — Ambulatory Visit: Payer: Self-pay | Admitting: Family Medicine

## 2024-05-26 ENCOUNTER — Ambulatory Visit (INDEPENDENT_AMBULATORY_CARE_PROVIDER_SITE_OTHER)

## 2024-05-26 ENCOUNTER — Ambulatory Visit: Payer: Self-pay

## 2024-05-26 ENCOUNTER — Encounter: Payer: Self-pay | Admitting: Family Medicine

## 2024-05-26 ENCOUNTER — Ambulatory Visit: Admitting: Family Medicine

## 2024-05-26 VITALS — BP 112/62 | HR 62 | Temp 97.8°F | Ht 72.0 in | Wt 221.0 lb

## 2024-05-26 DIAGNOSIS — M549 Dorsalgia, unspecified: Secondary | ICD-10-CM | POA: Diagnosis not present

## 2024-05-26 DIAGNOSIS — M51369 Other intervertebral disc degeneration, lumbar region without mention of lumbar back pain or lower extremity pain: Secondary | ICD-10-CM | POA: Diagnosis not present

## 2024-05-26 DIAGNOSIS — I10 Essential (primary) hypertension: Secondary | ICD-10-CM

## 2024-05-26 DIAGNOSIS — M545 Low back pain, unspecified: Secondary | ICD-10-CM

## 2024-05-26 DIAGNOSIS — M47816 Spondylosis without myelopathy or radiculopathy, lumbar region: Secondary | ICD-10-CM | POA: Diagnosis not present
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
## 2024-05-26 NOTE — Patient Instructions (Addendum)
 X-ray before you leave. Keep using cane and keep moving but don't overdo it.   Tylenol  and soaks are good steps- if you need further pain control let me know and depending on findings we may do orthopedic, sports medicine or neurosurgical referral  Recommended follow up: Return for as needed for new, worsening, persistent symptoms.

## 2024-05-26 NOTE — Progress Notes (Signed)
 Phone [phone] In person visit   Subjective:   Barry Decker is a 89 y.o. year old very pleasant male patient who presents for/with See problem oriented charting Chief Complaint  Patient presents with   Barry Decker yesterday when taking he trash out; lower back pain, left hip and buttock pain;     Past Medical History-  Patient Active Problem List   Diagnosis Date Noted   Multiple myeloma (HCC) 11/21/2022    Priority: High   Thrombus 05/31/2020    Priority: High   Ascending aortic aneurysm 03/26/2020    Priority: High   Status post transcatheter aortic valve replacement (TAVR) using bioprosthesis 09/25/2017    Priority: High   Aortic atherosclerosis 03/26/2020    Priority: Medium    B12 deficiency 09/23/2019    Priority: Medium    Chronic diastolic CHF (congestive heart failure) (HCC) 03/30/2017    Priority: Medium    HYPERCHOLESTEROLEMIA WITH HIGH HDL 01/31/2007    Priority: Medium    Gout 01/31/2007    Priority: Medium    Essential hypertension 01/31/2007    Priority: Medium    History of malignant neoplasm of large intestine 01/31/2007    Priority: Medium    Osteoarthritis of cervical spine 08/31/2015    Priority: Low   Osteoarthritis of left knee 05/25/2014    Priority: Low   Medication management 12/02/2020   Encounter for therapeutic drug monitoring 05/31/2020   Macrocytic anemia 03/21/2019   Kidney stones    Throat pain in adult 08/31/2015    Medications- reviewed and updated Current Outpatient Medications  Medication Sig Dispense Refill   acetaminophen  (TYLENOL ) 650 MG CR tablet Take 650 mg by mouth every 8 (eight) hours as needed for pain.     allopurinol  (ZYLOPRIM ) 300 MG tablet TAKE 1 TABLET BY MOUTH EVERY DAY 90 tablet 3   amLODipine  (NORVASC ) 10 MG tablet TAKE 1 TABLET BY MOUTH EVERY DAY 90 tablet 3   Ascorbic Acid (VITAMIN C) 1000 MG tablet Take 1,000 mg by mouth daily.     cyanocobalamin  1000 MCG tablet Take 1,000 mcg by mouth daily.      furosemide  (LASIX ) 20 MG tablet Take 1 tablet (20 mg total) by mouth daily. 30 tablet 0   lisinopril  (ZESTRIL ) 20 MG tablet TAKE 1 TABLET BY MOUTH EVERY DAY 90 tablet 3   omeprazole  (PRILOSEC) 40 MG capsule TAKE 1 CAPSULE (40 MG TOTAL) BY MOUTH DAILY. 90 capsule 3   potassium citrate  (UROCIT-K ) 10 MEQ (1080 MG) SR tablet Take 1 tablet (10 mEq total) by mouth 3 (three) times daily with meals. 300 tablet 1   rosuvastatin  (CRESTOR ) 20 MG tablet TAKE 1 TABLET BY MOUTH ONE TIME PER WEEK 13 tablet 3   triamcinolone  cream (KENALOG) 0.1 % Apply 1 Application topically as needed.     warfarin (COUMADIN ) 5 MG tablet TAKE 1 TO 1&1/2 TABLETS ONCE DAILY BY MOUTH AS DIRECTED BY ANTICOAGULATION CLINIC 135 tablet 1   amoxicillin  (AMOXIL ) 500 MG capsule TAKE 4 CAPS BY MOUTH AS DIRECTED 1 HOUR PRIOR TO DENTAL WORK INCLUDING CLEANINGS (Patient not taking: Reported on 05/26/2024) 8 capsule 2   No current facility-administered medications for this visit.     Objective:  BP 112/62 (BP Location: Left Arm, Patient Position: Sitting, Cuff Size: Normal)   Pulse 62   Temp 97.8 F (36.6 C) (Temporal)   Ht 6' (1.829 m)   Wt 221 lb (100.2 kg)   SpO2 95%   BMI 29.97  kg/m  Gen: NAD, resting comfortably CV: RRR no murmurs rubs or gallops Lungs: CTAB no crackles, wheeze, rhonchi Abdomen: soft/nontender/nondistended/normal bowel sounds. No rebound or guarding.  Ext: no edema Skin: warm, dry Back - Spine with normal alignment and no deformity.  No tenderness to vertebral process palpation except perhaps at L5 on left side.  Paraspinous muscles are tender more on the left. Good range of motion from sitting to standing without restriction- we did not test otherwise.  Negative Straight leg raise.  Neuro- no saddle anesthesia, 5/5 strength lower extremities, 2+ reflexes     Assessment and Plan   # Fall with low back pain into lateral left back and, buttocks pain S: Patient was taking the trash out yesterday - was  pulling it away from wall and came out quicker pushing him backwards onto pavement with left hip and buttock and heard sounds of snapping of dry twig.  Since that time he has noted pain in the low back, left hip and buttocks - notes not only muscular but a particular area of pain with movement located in left low back just left of central. No groin pain.  - walking with his cane still- moving more slowly but can move - neighbor came and helped and plans to help from here on out- wife had same issue with trash can in different circumstances -soak in tub and tylenol  helpful -imaging as stat to get information back on possible fracture   ROS-No saddle anesthesia, bladder incontinence, fecal incontinence, weakness in extremity, numbness or tingling in extremity. Does have history of trauma and multiple myeloma but smoldering. no fever, chills, unintentional weight loss, recent bacterial infection, recent IV drug use, HIV, pain worse at night or while supine.   A/P:Fall onto left buttocks/low back with midline back pain after fall - x-ray today to evaluate for fracture. Pain is reasonably controlled with tylenol  and plans to continue. Higher risk for fracture with multiple myeloma vs MGUS though indolent.    #hypertension S: medication: Amlodipine  10 mg, Lasix  20 mg, lisinopril  20 mg  A/P: blood pressure well controlled despite pain thankfull- continue current medications    Recommended follow up: Return for as needed for new, worsening, persistent symptoms. Future Appointments  Date Time Provider Department Center  06/02/2024 11:00 AM [facility] COUMADIN  CLINIC 2 [facility] H&V  07/08/2024  9:20 AM [facility] ANNUAL WELLNESS VISIT 1 [facility] Barry Decker  07/22/2024 10:30 AM [facility] PHLEBOTOMIST [facility] None  07/23/2024 11:40 AM Cloretta Arley NOVAK, MD [facility] None  10/07/2024 10:20 AM Katrinka Garnette KIDD, MD [facility] Barry Decker    Lab/Order associations:   ICD-10-CM   1. Acute midline low back pain  without sciatica  M54.50 DG Lumbar Spine Complete    CANCELED: DG Lumbar Spine Complete    2. Essential hypertension  I10       No orders of the defined types were placed in this encounter.   Return precautions advised.  Garnette Katrinka, MD

## 2024-05-26 NOTE — Telephone Encounter (Signed)
 Pt scheduled to see Dr. Katrinka today. Dr. Katrinka will review triage notes.

## 2024-06-02 ENCOUNTER — Ambulatory Visit: Attending: Cardiovascular Disease

## 2024-06-02 DIAGNOSIS — I8290 Acute embolism and thrombosis of unspecified vein: Secondary | ICD-10-CM

## 2024-06-02 DIAGNOSIS — Z79899 Other long term (current) drug therapy: Secondary | ICD-10-CM

## 2024-06-02 DIAGNOSIS — Z5181 Encounter for therapeutic drug level monitoring: Secondary | ICD-10-CM

## 2024-06-02 LAB — POCT INR: INR: 5.1 — AB (ref 2.0–3.0)

## 2024-06-02 NOTE — Patient Instructions (Signed)
 Hold Tuesday and Wednesday then Continue taking warfarin 1 tablet daily except for 1.5 tablets on Sunday, Tuesday, and Thursday. Recheck INR in 3 weeks.  Coumadin  Clinic [phone], Main number [phone].

## 2024-06-02 NOTE — Progress Notes (Signed)
 INR 5.1 Please see anticoagulation encounter Hold Tuesday and Wednesday then Continue taking warfarin 1 tablet daily except for 1.5 tablets on Sunday, Tuesday, and Thursday. Recheck INR in 3 weeks.  Coumadin  Clinic [phone], Main number [phone].

## 2024-06-05 ENCOUNTER — Ambulatory Visit: Payer: Self-pay
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
## 2024-06-09 DIAGNOSIS — M545 Low back pain, unspecified: Secondary | ICD-10-CM | POA: Insufficient documentation

## 2024-06-23 ENCOUNTER — Telehealth: Payer: Self-pay | Admitting: Oncology

## 2024-06-23 ENCOUNTER — Ambulatory Visit: Attending: Cardiovascular Disease | Admitting: *Deleted

## 2024-06-23 ENCOUNTER — Ambulatory Visit: Payer: Self-pay

## 2024-06-23 ENCOUNTER — Ambulatory Visit (INDEPENDENT_AMBULATORY_CARE_PROVIDER_SITE_OTHER): Admitting: Family

## 2024-06-23 ENCOUNTER — Encounter: Payer: Self-pay | Admitting: Family

## 2024-06-23 VITALS — BP 110/58 | HR 70 | Temp 97.3°F | Ht 72.0 in | Wt 225.8 lb

## 2024-06-23 DIAGNOSIS — Z5181 Encounter for therapeutic drug level monitoring: Secondary | ICD-10-CM | POA: Diagnosis not present

## 2024-06-23 DIAGNOSIS — I8290 Acute embolism and thrombosis of unspecified vein: Secondary | ICD-10-CM

## 2024-06-23 DIAGNOSIS — R21 Rash and other nonspecific skin eruption: Secondary | ICD-10-CM | POA: Diagnosis not present

## 2024-06-23 LAB — POCT INR: INR: 4.4 — AB (ref 2.0–3.0)

## 2024-06-23 MED ORDER — CEPHALEXIN 500 MG PO CAPS: 500.0000 mg | ORAL_CAPSULE | Freq: Two times a day (BID) | ORAL | 0 refills | Status: AC

## 2024-06-23 NOTE — Progress Notes (Signed)
 "  Patient ID: Barry Decker, male    DOB: 1933-01-03, 89 y.o.   MRN: [id number]  Chief Complaint  Patient presents with   Rash    Pt c/o red rash and swelling on left arm, present for [redacted] week along with chills and loss of appetite. Sx have subsided slightly.   Discussed the use of AI scribe software for clinical note transcription with the patient, who gave verbal consent to proceed.  History of Present Illness Barry Decker is an 89 year old male with blood cancer who presents with a tender, red area on his arm and severe chills.  He developed a tender, red area on his arm a few days ago. The redness was initially higher on the arm and has since migrated downward. It feels like a bruise without known trauma. A few days ago the area was more red and hot with severe chills, shaking, feverish symptoms, and poor appetite lasting about three days. He took four 1000 mg amoxicillin  tablets at once a few days ago using leftover prophylactic dental antibiotics. Since then his arm symptoms and systemic symptoms have partially improved. 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 He has chronic dry, itchy skin on arms and back with frequent scratching that sometimes causes bleeding. He uses thick moisturizers and topical triamcinolone  on affected areas. He takes warfarin and had a recent high INR, which he connects to poor oral intake over the past three days and the recent amoxicillin . He had a nosebleed this morning that he attributes to the elevated INR.  Assessment & Plan Skin rash of the arm Acute tender, red rash with severe chills and fever. Atypical presentation for cellulitis. Possible bacterial infection due to scratching and low immunity from blood cancer. Recent amoxicillin  use may have affected INR levels. - Prescribed low dose cephalexin  500mg  bid x 5d for bacterial coverage. - Advised monitoring for bleeding due to warfarin interaction. - Instructed to report bleeding to Coumadin   clinic. - Sent prescription to pharmacy. - Call office if sx are persisting after finishing abt.  Chronic pruritus with dry skin Chronic dry skin and pruritus on arms and back. Managed with moisturizers and steroid cream. - Continue triamcinolone  cream on affected areas post-bathing. - Apply CeraVe moisturizer over steroid cream twice daily. - Use Dove body wash or similar moisturizing soap. - Ensure hydration with at least two liters of water daily.  Subjective:    Outpatient Medications Prior to Visit  Medication Sig Dispense Refill   acetaminophen  (TYLENOL ) 650 MG CR tablet Take 650 mg by mouth every 8 (eight) hours as needed for pain.     allopurinol  (ZYLOPRIM ) 300 MG tablet TAKE 1 TABLET BY MOUTH EVERY DAY 90 tablet 3   amLODipine  (NORVASC ) 10 MG tablet TAKE 1 TABLET BY MOUTH EVERY DAY 90 tablet 3   Ascorbic Acid (VITAMIN C) 1000 MG tablet Take 1,000 mg by mouth daily.     cyanocobalamin  1000 MCG tablet Take 1,000 mcg by mouth daily.     furosemide  (LASIX ) 20 MG tablet TAKE 1 TABLET BY MOUTH EVERY DAY 90 tablet 2   lisinopril  (ZESTRIL ) 20 MG tablet TAKE 1 TABLET BY MOUTH EVERY DAY 90 tablet 3   omeprazole  (PRILOSEC) 40 MG capsule TAKE 1 CAPSULE (40 MG TOTAL) BY MOUTH DAILY. 90 capsule 3   potassium citrate  (UROCIT-K ) 10 MEQ (1080 MG) SR tablet Take 1 tablet (10 mEq total) by mouth 3 (three) times daily with meals. 300 tablet 1  rosuvastatin  (CRESTOR ) 20 MG tablet TAKE 1 TABLET BY MOUTH ONE TIME PER WEEK 13 tablet 3   triamcinolone  cream (KENALOG) 0.1 % Apply 1 Application topically as needed.     warfarin (COUMADIN ) 5 MG tablet TAKE 1 TO 1&1/2 TABLETS ONCE DAILY BY MOUTH AS DIRECTED BY ANTICOAGULATION CLINIC 135 tablet 1   amoxicillin  (AMOXIL ) 500 MG capsule TAKE 4 CAPS BY MOUTH AS DIRECTED 1 HOUR PRIOR TO DENTAL WORK INCLUDING CLEANINGS (Patient not taking: Reported on 06/23/2024) 8 capsule 2   No facility-administered medications prior to visit.   Past Medical History:   Diagnosis Date   Aortic atherosclerosis    Aortic valve disease    a. severe aortic stenosis s/p TAVR 09/2017.   Arthritis    spinal (11/16/2015)   Atypical nevus 05/14/2013   moderate atypia - left scapula   Colon cancer (HCC)    precancer-partial colectomy   Dilated aortic root    Elevated HDL    First degree AV block    First degree AV block    Gout    Heart murmur    History of kidney stones    Hyperlipidemia    Hypertension    LBBB (left bundle branch block)    Nonobstructive atherosclerosis of coronary artery    Squamous cell skin cancer 08/04/2011   Left outer brow - CX3 + 5FU   Thoracic aortic aneurysm    Past Surgical History:  Procedure Laterality Date   BACK SURGERY     CARDIAC VALVE REPLACEMENT  tavr   CATARACT EXTRACTION W/ INTRAOCULAR LENS  IMPLANT, BILATERAL Bilateral 06/20/2015   COLON SURGERY  partial colonectomy   COLONOSCOPY W/ POLYPECTOMY     FACET JOINT INJECTION  06/20/2007   IR BONE MARROW BIOPSY & ASPIRATION  08/24/2022   JOINT REPLACEMENT  left knee   LITHOTRIPSY  06/19/2008   LUMBAR LAMINECTOMY  ~ 1992   PARTIAL COLECTOMY  06/20/1995   precancerours   PARTIAL KNEE ARTHROPLASTY Left 11/16/2015   Procedure: LEFT KNEE UNICOMPARTMENTAL ;  Surgeon: Evalene JONETTA Chancy, MD;  Location: [facility] OR;  Service: Orthopedics;  Laterality: Left;   REPLACEMENT UNICONDYLAR JOINT KNEE Left 11/16/2015   RIGHT/LEFT HEART CATH AND CORONARY ANGIOGRAPHY N/A 08/10/2017   Procedure: RIGHT/LEFT HEART CATH AND CORONARY ANGIOGRAPHY;  Surgeon: Verlin Lonni JONETTA, MD;  Location: [facility] INVASIVE CV LAB;  Service: Cardiovascular;  Laterality: N/A;   SPINE SURGERY  double laminectomy   TEE WITHOUT CARDIOVERSION N/A 09/25/2017   Procedure: TRANSESOPHAGEAL ECHOCARDIOGRAM (TEE);  Surgeon: Verlin Lonni JONETTA, MD;  Location: [facility];  Service: Open Heart Surgery;  Laterality: N/A;   TONSILLECTOMY     TRANSCATHETER AORTIC VALVE REPLACEMENT, TRANSFEMORAL N/A 09/25/2017   Procedure:  TRANSCATHETER AORTIC VALVE REPLACEMENT, TRANSFEMORAL;  Surgeon: Verlin Lonni JONETTA, MD;  Location: [facility] OR;  Service: Open Heart Surgery;  Laterality: N/A;   TYMPANOPLASTY Left 06/19/1996   ruptured ear drum with skin graft   Allergies[1]    Objective:    Physical Exam Vitals and nursing note reviewed.  Constitutional:      General: He is not in acute distress.    Appearance: Normal appearance.  HENT:     Head: Normocephalic.  Cardiovascular:     Rate and Rhythm: Normal rate and regular rhythm.  Pulmonary:     Effort: Pulmonary effort is normal.     Breath sounds: Normal breath sounds.  Musculoskeletal:        General: Normal range of motion.     Cervical back:  Normal range of motion.  Skin:    General: Skin is warm and dry.     Findings: Rash (mild diffuse erythema on left anterior forearm, small knot in middle of arm, approx 1.5cm in diameter, firm, no discoloration, no excoriation noted) present.  Neurological:     Mental Status: He is alert and oriented to person, place, and time.  Psychiatric:        Mood and Affect: Mood normal.    BP (!) 110/58 (BP Location: Left Arm, Patient Position: Sitting, Cuff Size: Large)   Pulse 70   Temp (!) 97.3 F (36.3 C) (Temporal)   Ht 6' (1.829 m)   Wt 225 lb 12.8 oz (102.4 kg)   SpO2 98%   BMI 30.62 kg/m  Wt Readings from Last 3 Encounters:  06/23/24 225 lb 12.8 oz (102.4 kg)  05/26/24 221 lb (100.2 kg)  04/03/24 217 lb 12.8 oz (98.8 kg)      Dierdre Mccalip, NP     [1] No Known Allergies  "

## 2024-06-23 NOTE — Telephone Encounter (Signed)
 Pt scheduled to see stephanie hudnell 06/23/24. Dr. Katrinka will review triage notes.

## 2024-06-23 NOTE — Telephone Encounter (Signed)
 PT called to schedule lab appt and has questions about a body scan, sent to Nurses station.

## 2024-06-23 NOTE — Patient Instructions (Signed)
 Description   Inr-4.4; Do not take any warfarin Tuesday and Wednesday the START taking warfarin 1 tablet daily except for 1.5 tablets on Sunday and Thursday. Please keep leafy green veggies in your diet.  Recheck INR in 3 weeks.  Coumadin  Clinic [phone], Main number [phone].

## 2024-06-23 NOTE — Telephone Encounter (Signed)
 FYI Only or Action Required?: FYI only for provider: appointment scheduled on today.  Patient was last seen in primary care on 05/26/2024 by Katrinka Garnette KIDD, MD.  Called Nurse Triage reporting Rash.  Symptoms began several days ago.  Interventions attempted: Nothing.  Symptoms are: gradually worsening.  Triage Disposition: See HCP Within 4 Hours (Or PCP Triage)  Patient/caregiver understands and will follow disposition?: yes will follow disposition  Copied from CRM #[id number]. Topic: Clinical - Red Word Triage >> Jun 23, 2024  8:03 AM Jeoffrey H wrote: [facility] that prompted transfer to Nurse Triage: [name] (patients wife)- is on DPR called and stated she noticed a spot on his left arm that was red. Over the past few days it grew into a larger spot. He is now c/o tenderness, chills, swelling, and the spot is hard. Has known blood cancer and anemia. Reason for Disposition  [1] Looks infected (e.g., spreading redness, pus) AND [2] large red area (> 2 inches or 5 cm)  Answer Assessment - Initial Assessment Questions 1. APPEARANCE of RASH: What does the rash look like? (e.g., blisters, dry flaky skin, red spots, redness, sores)     Red,  2. LOCATION: Where is the rash located?      L arm 3. NUMBER: How many spots are there?      1 4. SIZE: How big are the spots? (e.g., inches, cm; or compare to size of pinhead, tip of pen, eraser, pea)      Wrist to elbow 5. ONSET: When did the rash start?      Few days ago 6. ITCHING: Does the rash itch? If Yes, ask: How bad is the itch?  (Scale 0-10; or none, mild, moderate, severe)     denies 7. PAIN: Does the rash hurt? If Yes, ask: How bad is the pain?  (Scale 0-10; or none, mild, moderate, severe)     Tender, mild 8. OTHER SYMPTOMS: Do you have any other symptoms? (e.g., fever)     Chills, swelling, spot is hard  Denies drainage, unsure about fever-states that he has chills  Protocols used: Rash or Redness -  Localized-A-AH

## 2024-06-23 NOTE — Progress Notes (Signed)
 Description   Inr-4.4; Do not take any warfarin Tuesday and Wednesday the START taking warfarin 1 tablet daily except for 1.5 tablets on Sunday and Thursday. Please keep leafy green veggies in your diet.  Recheck INR in 3 weeks.  Coumadin  Clinic [phone], Main number [phone].

## 2024-06-30 ENCOUNTER — Telehealth: Payer: Self-pay | Admitting: Oncology

## 2024-06-30 ENCOUNTER — Ambulatory Visit (HOSPITAL_BASED_OUTPATIENT_CLINIC_OR_DEPARTMENT_OTHER)
Admission: RE | Admit: 2024-06-30 | Discharge: 2024-06-30 | Disposition: A | Discharge status: Home / Self Care | Source: Ambulatory Visit | Attending: Oncology | Admitting: Oncology

## 2024-06-30 DIAGNOSIS — C9000 Multiple myeloma not having achieved remission: Secondary | ICD-10-CM | POA: Diagnosis present

## 2024-06-30 NOTE — Telephone Encounter (Signed)
 PT called with questions about appt details.

## 2024-07-08 ENCOUNTER — Ambulatory Visit (INDEPENDENT_AMBULATORY_CARE_PROVIDER_SITE_OTHER): Payer: PPO

## 2024-07-08 VITALS — Ht 72.0 in | Wt 225.0 lb

## 2024-07-08 DIAGNOSIS — Z0000 Encounter for general adult medical examination without abnormal findings: Secondary | ICD-10-CM | POA: Diagnosis not present

## 2024-07-08 NOTE — Progress Notes (Signed)
 "  Chief Complaint  Patient presents with   Medicare Wellness     Subjective:   Barry Decker is a 89 y.o. male who presents for a Medicare Annual Wellness Visit.  Visit info / Clinical Intake: Medicare Wellness Visit Type:: Subsequent Annual Wellness Visit Persons participating in visit and providing information:: patient Medicare Wellness Visit Mode:: Telephone If telephone:: video declined Since this visit was completed virtually, some vitals may be partially provided or unavailable. Missing vitals are due to the limitations of the virtual format.: Unable to obtain vitals - no equipment If Telephone or Video please confirm:: I connected with patient using audio/video enable telemedicine. I verified patient identity with two identifiers, discussed telehealth limitations, and patient agreed to proceed. Patient Location:: home Provider Location:: office Interpreter Needed?: No Pre-visit prep was completed: yes AWV questionnaire completed by patient prior to visit?: yes Date:: 07/06/24 Living arrangements:: (Patient-Rptd) lives with spouse/significant other Patient's Overall Health Status Rating: (!) (Patient-Rptd) fair Typical amount of pain: (Patient-Rptd) some Does pain affect daily life?: (Patient-Rptd) no Are you currently prescribed opioids?: no  Dietary Habits and Nutritional Risks How many meals a day?: (Patient-Rptd) 3 Eats fruit and vegetables daily?: (Patient-Rptd) yes Most meals are obtained by: (Patient-Rptd) having others provide food In the last 2 weeks, have you had any of the following?: none Diabetic:: no  Functional Status Activities of Daily Living (to include ambulation/medication): (!) (Patient-Rptd) Needs Assist Ambulation: Independent with device- listed below Home Assistive Devices/Equipment: Eyeglasses; Cane Medication Administration: (Patient-Rptd) Independent Home Management (perform basic housework or laundry): (Patient-Rptd) Independent Manage  your own finances?: (Patient-Rptd) yes Primary transportation is: (Patient-Rptd) driving Concerns about vision?: no *vision screening is required for WTM* Concerns about hearing?: no  Fall Screening Falls in the past year?: (Patient-Rptd) 1 Number of falls in past year: (Patient-Rptd) 0 Was there an injury with Fall?: 1 (back in theraphy) Fall Risk Category Calculator: (Patient-Rptd) 2 Patient Fall Risk Level: (Patient-Rptd) Moderate Fall Risk  Fall Risk Patient at Risk for Falls Due to: History of fall(s); Impaired balance/gait Fall risk Follow up: Falls evaluation completed  Home and Transportation Safety: All rugs have non-skid backing?: (!) (Patient-Rptd) no All stairs or steps have railings?: (Patient-Rptd) yes Grab bars in the bathtub or shower?: (Patient-Rptd) yes Have non-skid surface in bathtub or shower?: (Patient-Rptd) yes Good home lighting?: (Patient-Rptd) yes Regular seat belt use?: (Patient-Rptd) yes Hospital stays in the last year:: (Patient-Rptd) no  Cognitive Assessment Difficulty concentrating, remembering, or making decisions? : (Patient-Rptd) no Will 6CIT or Mini Cog be Completed: yes What year is it?: 0 points What month is it?: 0 points Give patient an address phrase to remember (5 components): [address] [place]  About what time is it?: 0 points Count backwards from 20 to 1: 0 points Say the months of the year in reverse: 0 points Repeat the address phrase from earlier: 0 points 6 CIT Score: 0 points  Advance Directives (For Healthcare) Does Patient Have a Medical Advance Directive?: Yes Does patient want to make changes to medical advance directive?: No - Patient declined Type of Advance Directive: Healthcare Power of Attorney Copy of Healthcare Power of Attorney in Chart?: No - copy requested (request updated) Copy of Living Will in Chart?: Yes - validated most recent copy scanned in chart (See row information)  Reviewed/Updated   Reviewed/Updated: Reviewed All (Medical, Surgical, Family, Medications, Allergies, Care Teams, Patient Goals)    Allergies (verified) Patient has no known allergies.   Current Medications (verified) Outpatient  Encounter Medications as of 07/08/2024  Medication Sig   acetaminophen  (TYLENOL ) 650 MG CR tablet Take 650 mg by mouth every 8 (eight) hours as needed for pain.   allopurinol  (ZYLOPRIM ) 300 MG tablet TAKE 1 TABLET BY MOUTH EVERY DAY   amLODipine  (NORVASC ) 10 MG tablet TAKE 1 TABLET BY MOUTH EVERY DAY   amoxicillin  (AMOXIL ) 500 MG capsule TAKE 4 CAPS BY MOUTH AS DIRECTED 1 HOUR PRIOR TO DENTAL WORK INCLUDING CLEANINGS   Ascorbic Acid (VITAMIN C) 1000 MG tablet Take 1,000 mg by mouth daily.   cyanocobalamin  1000 MCG tablet Take 1,000 mcg by mouth daily.   furosemide  (LASIX ) 20 MG tablet TAKE 1 TABLET BY MOUTH EVERY DAY   lisinopril  (ZESTRIL ) 20 MG tablet TAKE 1 TABLET BY MOUTH EVERY DAY   omeprazole  (PRILOSEC) 40 MG capsule TAKE 1 CAPSULE (40 MG TOTAL) BY MOUTH DAILY.   potassium citrate  (UROCIT-K ) 10 MEQ (1080 MG) SR tablet Take 1 tablet (10 mEq total) by mouth 3 (three) times daily with meals.   rosuvastatin  (CRESTOR ) 20 MG tablet TAKE 1 TABLET BY MOUTH ONE TIME PER WEEK   triamcinolone  cream (KENALOG) 0.1 % Apply 1 Application topically as needed.   warfarin (COUMADIN ) 5 MG tablet TAKE 1 TO 1&1/2 TABLETS ONCE DAILY BY MOUTH AS DIRECTED BY ANTICOAGULATION CLINIC   No facility-administered encounter medications on file as of 07/08/2024.    History: Past Medical History:  Diagnosis Date   Aortic atherosclerosis    Aortic valve disease    a. severe aortic stenosis s/p TAVR 09/2017.   Arthritis    spinal (11/16/2015)   Atypical nevus 05/14/2013   moderate atypia - left scapula   Colon cancer (HCC)    precancer-partial colectomy   Dilated aortic root    Elevated HDL    First degree AV block    First degree AV block    Gout    Heart murmur    History of kidney stones     Hyperlipidemia    Hypertension    LBBB (left bundle branch block)    Nonobstructive atherosclerosis of coronary artery    Squamous cell skin cancer 08/04/2011   Left outer brow - CX3 + 5FU   Thoracic aortic aneurysm    Past Surgical History:  Procedure Laterality Date   BACK SURGERY     CARDIAC VALVE REPLACEMENT  tavr   CATARACT EXTRACTION W/ INTRAOCULAR LENS  IMPLANT, BILATERAL Bilateral 06/20/2015   COLON SURGERY  partial colonectomy   COLONOSCOPY W/ POLYPECTOMY     FACET JOINT INJECTION  06/20/2007   IR BONE MARROW BIOPSY & ASPIRATION  08/24/2022   JOINT REPLACEMENT  left knee   LITHOTRIPSY  06/19/2008   LUMBAR LAMINECTOMY  ~ 1992   PARTIAL COLECTOMY  06/20/1995   precancerours   PARTIAL KNEE ARTHROPLASTY Left 11/16/2015   Procedure: LEFT KNEE UNICOMPARTMENTAL ;  Surgeon: Evalene JONETTA Chancy, MD;  Location: [facility] OR;  Service: Orthopedics;  Laterality: Left;   REPLACEMENT UNICONDYLAR JOINT KNEE Left 11/16/2015   RIGHT/LEFT HEART CATH AND CORONARY ANGIOGRAPHY N/A 08/10/2017   Procedure: RIGHT/LEFT HEART CATH AND CORONARY ANGIOGRAPHY;  Surgeon: Verlin Lonni JONETTA, MD;  Location: [facility] INVASIVE CV LAB;  Service: Cardiovascular;  Laterality: N/A;   SPINE SURGERY  double laminectomy   TEE WITHOUT CARDIOVERSION N/A 09/25/2017   Procedure: TRANSESOPHAGEAL ECHOCARDIOGRAM (TEE);  Surgeon: Verlin Lonni JONETTA, MD;  Location: [facility] OR;  Service: Open Heart Surgery;  Laterality: N/A;   TONSILLECTOMY     TRANSCATHETER AORTIC VALVE  REPLACEMENT, TRANSFEMORAL N/A 09/25/2017   Procedure: TRANSCATHETER AORTIC VALVE REPLACEMENT, TRANSFEMORAL;  Surgeon: Verlin Lonni BIRCH, MD;  Location: [facility] OR;  Service: Open Heart Surgery;  Laterality: N/A;   TYMPANOPLASTY Left 06/19/1996   ruptured ear drum with skin graft   Family History  Problem Relation Age of Onset   Heart attack Mother 74       former smoker   Heart attack Father 17       former smoker   Hypertension Sister    Heart attack Brother  61       Rheumatic fever   Heart disease Brother    Social History   Occupational History   Occupation: Administrator, civil service  Tobacco Use   Smoking status: Former    Current packs/day: 0.00    Average packs/day: 1 pack/day for 20.0 years (20.0 ttl pk-yrs)    Types: Cigarettes    Start date: 09/15/1960    Quit date: 09/15/1980    Years since quitting: 43.8   Smokeless tobacco: Never   Tobacco comments:    no  Vaping Use   Vaping status: Never Used  Substance and Sexual Activity   Alcohol use: Not Currently    Alcohol/week: 7.0 standard drinks of alcohol    Types: 1 Glasses of wine, 1 Shots of liquor, 5 Standard drinks or equivalent per week    Comment: no   Drug use: Never   Sexual activity: Not Currently    Birth control/protection: None    Comment: no   Tobacco Counseling Counseling given: Not Answered Tobacco comments: no  SDOH Screenings   Food Insecurity: No Food Insecurity (07/08/2024)  Housing: Low Risk (07/08/2024)  Transportation Needs: No Transportation Needs (07/08/2024)  Utilities: Not At Risk (07/08/2024)  Alcohol Screen: Low Risk (07/08/2024)  Depression (PHQ2-9): Low Risk (07/08/2024)  Financial Resource Strain: Low Risk (04/01/2024)  Physical Activity: Sufficiently Active (07/08/2024)  Social Connections: Socially Integrated (07/08/2024)  Stress: No Stress Concern Present (07/08/2024)  Tobacco Use: Medium Risk (07/08/2024)  Health Literacy: Adequate Health Literacy (07/08/2024)   See flowsheets for full screening details  Depression Screen PHQ 2 & 9 Depression Scale- Over the past 2 weeks, how often have you been bothered by any of the following problems? Little interest or pleasure in doing things: 0 Feeling down, depressed, or hopeless (PHQ Adolescent also includes...irritable): 0 PHQ-2 Total Score: 0 Trouble falling or staying asleep, or sleeping too much: 0 Feeling tired or having little energy: 0 Poor appetite or overeating (PHQ Adolescent also  includes...weight loss): 0 Feeling bad about yourself - or that you are a failure or have let yourself or your family down: 0 Trouble concentrating on things, such as reading the newspaper or watching television (PHQ Adolescent also includes...like school work): 0 Moving or speaking so slowly that other people could have noticed. Or the opposite - being so fidgety or restless that you have been moving around a lot more than usual: 0 Thoughts that you would be better off dead, or of hurting yourself in some way: 0 PHQ-9 Total Score: 0 If you checked off any problems, how difficult have these problems made it for you to do your work, take care of things at home, or get along with other people?: Not difficult at all     Goals Addressed               This Visit's Progress     maintain health and activity (pt-stated)        Maintain  health and activity              Objective:    Today's Vitals   07/08/24 0922  Weight: 225 lb (102.1 kg)  Height: 6' (1.829 m)   Body mass index is 30.52 kg/m.  Hearing/Vision screen Hearing Screening - Comments:: Pt denies any hearing issues  Vision Screening - Comments:: Wears rx glasses - up to date with routine eye exams with Dr Loria Austin  Immunizations and Health Maintenance Health Maintenance  Topic Date Due   COVID-19 Vaccine (8 - Pfizer risk 2025-26 season) 08/16/2024   Medicare Annual Wellness (AWV)  07/08/2025   DTaP/Tdap/Td (3 - Td or Tdap) 05/22/2028   Pneumococcal Vaccine: 50+ Years  Completed   Influenza Vaccine  Completed   Zoster Vaccines- Shingrix  Completed   Meningococcal B Vaccine  Aged Out        Assessment/Plan:  This is a routine wellness examination for Hyrum.  Patient Care Team: Katrinka Garnette KIDD, MD as PCP - General (Family Medicine) Verlin Lonni BIRCH, MD as PCP - Cardiology (Cardiology) Nicholaus Sherlean CROME, [facility] (Inactive) (Pharmacist)  I have personally reviewed and noted the following in the  patients chart:   Medical and social history Use of alcohol, tobacco or illicit drugs  Current medications and supplements including opioid prescriptions. Functional ability and status Nutritional status Physical activity Advanced directives List of other physicians Hospitalizations, surgeries, and ER visits in previous 12 months Vitals Screenings to include cognitive, depression, and falls Referrals and appointments  No orders of the defined types were placed in this encounter.  In addition, I have reviewed and discussed with patient certain preventive protocols, quality metrics, and best practice recommendations. A written personalized care plan for preventive services as well as general preventive health recommendations were provided to patient.   Ellouise VEAR Haws, LPN   [id number]/79/7973   Return in about 1 year (around 07/13/2025).  After Visit Summary: (MyChart) Due to this being a telephonic visit, the after visit summary with patients personalized plan was offered to patient via MyChart   Nurse Notes: Patient advised to keep follow-up appointment with PCP (10/07/24)  "

## 2024-07-08 NOTE — Patient Instructions (Signed)
 Barry Decker,  Thank you for taking the time for your Medicare Wellness Visit. I appreciate your continued commitment to your health goals. Please review the care plan we discussed, and feel free to reach out if I can assist you further.  Please note that Annual Wellness Visits do not include a physical exam. Some assessments may be limited, especially if the visit was conducted virtually. If needed, we may recommend an in-person follow-up with your provider.  Ongoing Care Seeing your primary care provider every 3 to 6 months helps us  monitor your health and provide consistent, personalized care.   Referrals If a referral was made during today's visit and you haven't received any updates within two weeks, please contact the referred provider directly to check on the status.  Recommended Screenings:  Health Maintenance  Topic Date Due   COVID-19 Vaccine (8 - Pfizer risk 2025-26 season) 08/16/2024   Medicare Annual Wellness Visit  07/08/2025   DTaP/Tdap/Td vaccine (3 - Td or Tdap) 05/22/2028   Pneumococcal Vaccine for age over 16  Completed   Flu Shot  Completed   Zoster (Shingles) Vaccine  Completed   Meningitis B Vaccine  Aged Out       07/06/2024   10:38 AM  Advanced Directives  Does Patient Have a Medical Advance Directive? Yes  Type of Advance Directive Healthcare Power of Attorney  Copy of Healthcare Power of Attorney in Chart? No - copy requested    Vision: Annual vision screenings are recommended for early detection of glaucoma, cataracts, and diabetic retinopathy. These exams can also reveal signs of chronic conditions such as diabetes and high blood pressure.  Dental: Annual dental screenings help detect early signs of oral cancer, gum disease, and other conditions linked to overall health, including heart disease and diabetes.  Please see the attached documents for additional preventive care recommendations.

## 2024-07-14 ENCOUNTER — Ambulatory Visit

## 2024-07-16 ENCOUNTER — Inpatient Hospital Stay: Attending: Oncology

## 2024-07-16 DIAGNOSIS — C9000 Multiple myeloma not having achieved remission: Secondary | ICD-10-CM

## 2024-07-16 LAB — CMP (CANCER CENTER ONLY)
ALT: 12 U/L (ref 0–44)
AST: 17 U/L (ref 15–41)
Albumin: 3.6 g/dL (ref 3.5–5.0)
Alkaline Phosphatase: 97 U/L (ref 38–126)
Anion gap: 11 (ref 5–15)
BUN: 24 mg/dL — ABNORMAL HIGH (ref 8–23)
CO2: 26 mmol/L (ref 22–32)
Calcium: 9.8 mg/dL (ref 8.9–10.3)
Chloride: 104 mmol/L (ref 98–111)
Creatinine: 1.14 mg/dL (ref 0.61–1.24)
GFR, Estimated: >60 mL/min
Glucose, Bld: 100 mg/dL — ABNORMAL HIGH (ref 70–99)
Potassium: 4.5 mmol/L (ref 3.5–5.1)
Sodium: 141 mmol/L (ref 135–145)
Total Bilirubin: 0.2 mg/dL (ref 0.0–1.2)
Total Protein: 6.9 g/dL (ref 6.5–8.1)

## 2024-07-16 LAB — CBC WITH DIFFERENTIAL (CANCER CENTER ONLY)
Abs Immature Granulocytes: 0.03 10*3/uL (ref 0.00–0.07)
Basophils Absolute: 0 10*3/uL (ref 0.0–0.1)
Basophils Relative: 0 %
Eosinophils Absolute: 0.1 10*3/uL (ref 0.0–0.5)
Eosinophils Relative: 3 %
HCT: 30.4 % — ABNORMAL LOW (ref 39.0–52.0)
Hemoglobin: 10.1 g/dL — ABNORMAL LOW (ref 13.0–17.0)
Immature Granulocytes: 1 %
Lymphocytes Relative: 36 %
Lymphs Abs: 1 10*3/uL (ref 0.7–4.0)
MCH: 38 pg — ABNORMAL HIGH (ref 26.0–34.0)
MCHC: 33.2 g/dL (ref 30.0–36.0)
MCV: 114.3 fL — ABNORMAL HIGH (ref 80.0–100.0)
Monocytes Absolute: 0.2 10*3/uL (ref 0.1–1.0)
Monocytes Relative: 8 %
Neutro Abs: 1.4 10*3/uL — ABNORMAL LOW (ref 1.7–7.7)
Neutrophils Relative %: 52 %
Platelet Count: 225 10*3/uL (ref 150–400)
RBC: 2.66 MIL/uL — ABNORMAL LOW (ref 4.22–5.81)
RDW: 14.9 % (ref 11.5–15.5)
WBC Count: 2.8 10*3/uL — ABNORMAL LOW (ref 4.0–10.5)
nRBC: 0 % (ref 0.0–0.2)

## 2024-07-17 LAB — KAPPA/LAMBDA LIGHT CHAINS
Kappa free light chain: 223.1 mg/L — ABNORMAL HIGH (ref 3.3–19.4)
Kappa, lambda light chain ratio: 12.46 — ABNORMAL HIGH (ref 0.26–1.65)
Lambda free light chains: 17.9 mg/L (ref 5.7–26.3)

## 2024-07-17 LAB — IGA: IgA: 656 mg/dL — ABNORMAL HIGH (ref 61–437)

## 2024-07-18 LAB — PROTEIN ELECTROPHORESIS, SERUM
A/G Ratio: 1.1 (ref 0.7–1.7)
Albumin ELP: 3.1 g/dL (ref 2.9–4.4)
Alpha-1-Globulin: 0.3 g/dL (ref 0.0–0.4)
Alpha-2-Globulin: 0.7 g/dL (ref 0.4–1.0)
Beta Globulin: 1.2 g/dL (ref 0.7–1.3)
Gamma Globulin: 0.7 g/dL (ref 0.4–1.8)
Globulin, Total: 2.9 g/dL (ref 2.2–3.9)
M-Spike, %: 0.3 g/dL — ABNORMAL HIGH
Total Protein ELP: 6 g/dL (ref 6.0–8.5)

## 2024-07-22 ENCOUNTER — Other Ambulatory Visit

## 2024-07-23 ENCOUNTER — Other Ambulatory Visit

## 2024-07-23 ENCOUNTER — Inpatient Hospital Stay: Admitting: Oncology

## 2024-07-23 VITALS — BP 114/62 | HR 60 | Temp 97.6°F | Resp 18 | Ht 72.0 in | Wt 223.1 lb

## 2024-07-23 DIAGNOSIS — C9000 Multiple myeloma not having achieved remission: Secondary | ICD-10-CM

## 2024-07-23 NOTE — Progress Notes (Signed)
" °  [facility] OFFICE PROGRESS NOTE   Diagnosis: Multiple myeloma  INTERVAL HISTORY:   Barry Decker returns as scheduled.  He feels well.  No recent infection.  No new pain.  No complaint.  Objective:  Vital signs in last 24 hours:  Blood pressure 114/62, pulse 60, temperature 97.6 F (36.4 C), resp. rate 18, height 6' (1.829 m), weight 223 lb 1.6 oz (101.2 kg), SpO2 97%.     Lymphatics: No cervical, supraclavicular, axillary, or inguinal nodes Resp: Lungs clear bilaterally Cardio: Regular rate and rhythm GI: No hepatosplenomegaly Vascular: Trace lower leg edema bilaterally  Lab Results:  Lab Results  Component Value Date   WBC 2.8 (L) 07/16/2024   HGB 10.1 (L) 07/16/2024   HCT 30.4 (L) 07/16/2024   MCV 114.3 (H) 07/16/2024   PLT 225 07/16/2024   NEUTROABS 1.4 (L) 07/16/2024    CMP  Lab Results  Component Value Date   NA 141 07/16/2024   K 4.5 07/16/2024   CL 104 07/16/2024   CO2 26 07/16/2024   GLUCOSE 100 (H) 07/16/2024   BUN 24 (H) 07/16/2024   CREATININE 1.14 07/16/2024   CALCIUM  9.8 07/16/2024   PROT 6.9 07/16/2024   ALBUMIN  3.6 07/16/2024   AST 17 07/16/2024   ALT 12 07/16/2024   ALKPHOS 97 07/16/2024   BILITOT 0.2 07/16/2024   GFRNONAA >60 07/16/2024   GFRAA 70 05/11/2020    Medications: I have reviewed the patient's current medications.   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  Plasma cells show kappa light chain restriction consistent with plasma cell neoplasm.  Background shows trilineage hematopoiesis with nonspecific changes.  Normal cytogenetics.  FISH with no evidence of abnormalities. IgA kappa monoclonal protein, elevated free kappa light  chains 09/06/2022 bone survey-no radiographic evidence of multiple myeloma 12/04/2022 myeloma labs stable to improved 07/25/2023-myeloma labs stable 06/30/2024-bone survey negative for lytic lesions 07/16/2024 myeloma labs-stable aside from mild progression of anemia Mild neutropenia History of B12 deficiency Hypertension Hyperlipidemia Severe AS status post TAVR April 2019 Nonobstructive CAD Thoracic aortic aneurysm Back pain-MRI lumbar spine 12/04/2022 with mildly progressive foraminal narrowing bilaterally at L4-5 and on the left at L5-S1 and S1-2; stable enhancing lesion posteriorly L4 vertebral body; additional small foci of enhancement within the lumbar spine and upper sacrum bilaterally indeterminate for recurrent/progressive myeloma; no evidence of pathologic fracture.        Disposition: Barry Decker appears stable.  The myeloma panel from last week is not significantly changed.  The anemia has progressed over the past year.  He appears asymptomatic.  He would like to continue observation.  He return for an office and lab visit in 4 months.  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  We will initiate systemic therapy if he develops progressive anemia.  Barry Hof, MD  07/23/2024  12:08 PM   "

## 2024-07-24 ENCOUNTER — Ambulatory Visit

## 2024-07-24 DIAGNOSIS — Z79899 Other long term (current) drug therapy: Secondary | ICD-10-CM

## 2024-07-24 DIAGNOSIS — Z5181 Encounter for therapeutic drug level monitoring: Secondary | ICD-10-CM | POA: Diagnosis not present

## 2024-07-24 DIAGNOSIS — I8290 Acute embolism and thrombosis of unspecified vein: Secondary | ICD-10-CM | POA: Diagnosis not present

## 2024-07-24 LAB — POCT INR: INR: 4.2 — AB (ref 2.0–3.0)

## 2024-07-24 NOTE — Patient Instructions (Signed)
 Hold tomorrow only then START taking warfarin 1 tablet daily.  Please keep leafy green veggies in your diet.  Recheck INR in 3 weeks.  [facility] [phone], Main number [phone].

## 2024-07-24 NOTE — Progress Notes (Signed)
 INR 4.2   Hold tomorrow only then START taking warfarin 1 tablet daily.  Please keep leafy green veggies in your diet.  Recheck INR in 3 weeks.  [facility]  Clinic [phone], Main number [phone].

## 2024-08-14 ENCOUNTER — Ambulatory Visit

## 2024-10-07 ENCOUNTER — Encounter: Admitting: Family Medicine

## 2024-11-13 ENCOUNTER — Inpatient Hospital Stay

## 2024-11-20 ENCOUNTER — Inpatient Hospital Stay: Admitting: Oncology

## 2025-07-13 ENCOUNTER — Ambulatory Visit
